# Patient Record
Sex: Female | Born: 1945
Health system: Southern US, Community
[De-identification: ages and names within clinical notes are randomized; demographics above are authoritative.]

## PROBLEM LIST (undated history)

## (undated) DIAGNOSIS — K219 Gastro-esophageal reflux disease without esophagitis: Secondary | ICD-10-CM

## (undated) DIAGNOSIS — Z9981 Dependence on supplemental oxygen: Secondary | ICD-10-CM

## (undated) DIAGNOSIS — E669 Obesity, unspecified: Secondary | ICD-10-CM

## (undated) DIAGNOSIS — C49A3 Gastrointestinal stromal tumor of small intestine: Secondary | ICD-10-CM

## (undated) DIAGNOSIS — I272 Pulmonary hypertension, unspecified: Secondary | ICD-10-CM

## (undated) DIAGNOSIS — I83893 Varicose veins of bilateral lower extremities with other complications: Secondary | ICD-10-CM

## (undated) DIAGNOSIS — J189 Pneumonia, unspecified organism: Secondary | ICD-10-CM

## (undated) DIAGNOSIS — Z87891 Personal history of nicotine dependence: Secondary | ICD-10-CM

## (undated) DIAGNOSIS — E119 Type 2 diabetes mellitus without complications: Secondary | ICD-10-CM

## (undated) DIAGNOSIS — G4733 Obstructive sleep apnea (adult) (pediatric): Secondary | ICD-10-CM

## (undated) DIAGNOSIS — H409 Unspecified glaucoma: Secondary | ICD-10-CM

## (undated) DIAGNOSIS — D649 Anemia, unspecified: Secondary | ICD-10-CM

## (undated) DIAGNOSIS — G4730 Sleep apnea, unspecified: Secondary | ICD-10-CM

## (undated) DIAGNOSIS — I509 Heart failure, unspecified: Secondary | ICD-10-CM

## (undated) DIAGNOSIS — D689 Coagulation defect, unspecified: Secondary | ICD-10-CM

## (undated) DIAGNOSIS — J45909 Unspecified asthma, uncomplicated: Secondary | ICD-10-CM

## (undated) DIAGNOSIS — I10 Essential (primary) hypertension: Secondary | ICD-10-CM

## (undated) DIAGNOSIS — Z95828 Presence of other vascular implants and grafts: Secondary | ICD-10-CM

## (undated) DIAGNOSIS — J449 Chronic obstructive pulmonary disease, unspecified: Secondary | ICD-10-CM

## (undated) DIAGNOSIS — I2781 Cor pulmonale (chronic): Secondary | ICD-10-CM

## (undated) DIAGNOSIS — E785 Hyperlipidemia, unspecified: Secondary | ICD-10-CM

## (undated) HISTORY — DX: Coagulation defect, unspecified: D68.9

## (undated) HISTORY — DX: Varicose veins of bilateral lower extremities with other complications: I83.893

## (undated) HISTORY — DX: Personal history of nicotine dependence: Z87.891

## (undated) HISTORY — DX: Hyperlipidemia, unspecified: E78.5

## (undated) HISTORY — DX: Unspecified glaucoma: H40.9

## (undated) HISTORY — DX: Obesity, unspecified: E66.9

## (undated) HISTORY — DX: Unspecified asthma, uncomplicated: J45.909

## (undated) HISTORY — DX: Type 2 diabetes mellitus without complications: E11.9

## (undated) HISTORY — DX: Gastrointestinal stromal tumor of small intestine: C49.A3

---
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
## 1989-04-14 HISTORY — PX: BREAST BIOPSY: SHX20

## 2004-04-14 HISTORY — PX: VEIN SURGERY: SHX48

## 2004-09-19 ENCOUNTER — Ambulatory Visit: Payer: Self-pay | Admitting: General Surgery

## 2005-07-22 ENCOUNTER — Ambulatory Visit: Payer: Self-pay | Admitting: Internal Medicine

## 2005-10-02 ENCOUNTER — Ambulatory Visit: Payer: Self-pay | Admitting: General Surgery

## 2008-02-03 ENCOUNTER — Inpatient Hospital Stay: Payer: Self-pay | Admitting: Internal Medicine

## 2008-04-14 HISTORY — PX: COLONOSCOPY: SHX174

## 2008-04-19 ENCOUNTER — Ambulatory Visit: Payer: Self-pay | Admitting: Unknown Physician Specialty

## 2010-07-17 ENCOUNTER — Ambulatory Visit: Payer: Self-pay | Admitting: Internal Medicine

## 2010-07-30 ENCOUNTER — Ambulatory Visit: Payer: Self-pay | Admitting: Internal Medicine

## 2010-08-26 ENCOUNTER — Ambulatory Visit: Payer: Self-pay | Admitting: General Surgery

## 2010-08-27 LAB — PATHOLOGY REPORT

## 2011-02-27 ENCOUNTER — Ambulatory Visit: Payer: Self-pay | Admitting: General Surgery

## 2011-04-15 HISTORY — PX: BREAST BIOPSY: SHX20

## 2011-07-18 ENCOUNTER — Ambulatory Visit: Payer: Self-pay | Admitting: General Surgery

## 2012-01-01 ENCOUNTER — Ambulatory Visit: Payer: Self-pay | Admitting: Internal Medicine

## 2012-06-04 ENCOUNTER — Encounter: Payer: Self-pay | Admitting: General Surgery

## 2012-06-05 ENCOUNTER — Encounter: Payer: Self-pay | Admitting: General Surgery

## 2012-07-19 ENCOUNTER — Ambulatory Visit: Payer: Self-pay | Admitting: Internal Medicine

## 2012-07-19 ENCOUNTER — Encounter: Payer: Self-pay | Admitting: General Surgery

## 2012-08-05 ENCOUNTER — Encounter: Payer: Self-pay | Admitting: General Surgery

## 2012-08-05 ENCOUNTER — Ambulatory Visit (INDEPENDENT_AMBULATORY_CARE_PROVIDER_SITE_OTHER): Payer: Medicare Other | Admitting: General Surgery

## 2012-08-05 ENCOUNTER — Other Ambulatory Visit: Payer: Self-pay | Admitting: *Deleted

## 2012-08-05 VITALS — BP 136/64 | HR 74 | Resp 12 | Ht 66.0 in | Wt 226.0 lb

## 2012-08-05 DIAGNOSIS — I83893 Varicose veins of bilateral lower extremities with other complications: Secondary | ICD-10-CM | POA: Insufficient documentation

## 2012-08-05 DIAGNOSIS — Z1231 Encounter for screening mammogram for malignant neoplasm of breast: Secondary | ICD-10-CM

## 2012-08-05 DIAGNOSIS — N6019 Diffuse cystic mastopathy of unspecified breast: Secondary | ICD-10-CM

## 2012-08-05 NOTE — Progress Notes (Signed)
Patient will be asked to return to the office in one year for a bilateral screening mammogram.

## 2012-08-05 NOTE — Patient Instructions (Addendum)
Patient to return in 1 year with bilateral screening mammogram. Continue use of compression hose. Call if any problems arise.

## 2012-08-05 NOTE — Progress Notes (Signed)
Patient ID: Susan Barajas, female   DOB: Sep 13, 1945, 67 y.o.   MRN: [id number]  Chief Complaint  Patient presents with  . Other    mammogram    HPI Susan Barajas is a 67 y.o. female. here today for her follow up mammogram done on 07-19-12, CAT 2. She has a known history of fibrocystic breast disease with a left breast biopsy 2013. Denies any new breast issues.  No family history of breast cancer. She has a history of Vein Closure Procedure; RF ablation of right GSV in 2006. Currently using compression hose with good control of symptoms.  HPI  Past Medical History  Diagnosis Date  . Asthma 1950  . Glaucoma 1949  . Personal history of tobacco use, presenting hazards to health   . Breast screening, unspecified   . Mammographic microcalcification   . Special screening for malignant neoplasms, colon   . 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 Obesity, unspecified   . Varicose veins of lower extremities with other complications   . Screening for obesity     Past Surgical History  Procedure Laterality Date  . Vein surgery Right 2006    Vein Closure Procedure; RF ablation of right GSV  . Colonoscopy  2010    Dr. Vira Agar  . Breast biopsy Right 1991  . Breast biopsy Left 2013    Family History  Problem Relation Age of Onset  . Prostate cancer Father   . Other Sister     mouth cancer    Social History History  Substance Use Topics  . Smoking status: Former Smoker -- 1.00 packs/day for 20 years  . Smokeless tobacco: Not on file  . Alcohol Use: No    No Known Allergies  Current Outpatient Prescriptions  Medication Sig Dispense Refill  . ferrous gluconate (FERGON) 325 MG tablet Take 325 mg by mouth daily.      . hydrochlorothiazide (HYDRODIURIL) 25 MG tablet Take 25 mg by mouth daily.      . insulin glargine (LANTUS) 100 UNIT/ML injection Inject into the skin at bedtime.      . insulin lispro (HUMALOG) 100 UNIT/ML injection Inject into the skin 3 (three) times  daily before meals.      . lovastatin (MEVACOR) 20 MG tablet Take 20 mg by mouth daily.      . metFORMIN (GLUCOPHAGE) 1000 MG tablet Take 1,000 mg by mouth 2 (two) times daily with a meal.      . Multiple Vitamin (MULTIVITAMIN) tablet Take 1 tablet by mouth daily.      . pioglitazone (ACTOS) 30 MG tablet Take 30 mg by mouth daily.       No current facility-administered medications for this visit.    Review of Systems Review of Systems  Constitutional: Negative.   Respiratory: Negative.   Cardiovascular: Negative.     Blood pressure 136/64, pulse 74, resp. rate 12, height _0  (1.676 m), weight 226 lb (102.513 kg).  Physical Exam Physical Exam  Constitutional: She appears well-developed and well-nourished.  Eyes: Conjunctivae are normal. No scleral icterus.  Neck: Trachea normal. No mass and no thyromegaly present.  Cardiovascular: Normal rate, normal heart sounds and normal pulses.  An irregular rhythm present.  No murmur heard. Irregular heart rate has been noted before and evaluated.   Pulmonary/Chest: Effort normal and breath sounds normal. Right breast exhibits no inverted nipple, no mass, no nipple discharge, no skin change and no tenderness. Left breast exhibits no inverted  nipple, no mass, no nipple discharge, no skin change and no tenderness.  Abdominal: Soft. Normal appearance and bowel sounds are normal. There is no hepatosplenomegaly. There is no tenderness.  Lymphadenopathy:    She has no cervical adenopathy.    She has no axillary adenopathy.    Data Reviewed Mammogram reviewed-Stable.  Assessment    Stable exam.     Plan    1 yr f/u        [facility] G 08/06/2012, 6:39 AM

## 2012-08-06 ENCOUNTER — Encounter: Payer: Self-pay | Admitting: General Surgery

## 2013-07-20 ENCOUNTER — Ambulatory Visit: Payer: Self-pay | Admitting: General Surgery

## 2013-07-27 ENCOUNTER — Ambulatory Visit: Payer: Medicare Other | Admitting: General Surgery

## 2013-08-01 ENCOUNTER — Ambulatory Visit: Payer: Self-pay | Admitting: General Surgery

## 2013-08-01 ENCOUNTER — Encounter: Payer: Self-pay | Admitting: General Surgery

## 2013-08-04 ENCOUNTER — Encounter: Payer: Self-pay | Admitting: General Surgery

## 2013-08-04 ENCOUNTER — Ambulatory Visit (INDEPENDENT_AMBULATORY_CARE_PROVIDER_SITE_OTHER): Payer: Medicare Other | Admitting: General Surgery

## 2013-08-04 VITALS — BP 160/68 | HR 68 | Resp 16 | Ht 66.0 in | Wt 230.0 lb

## 2013-08-04 DIAGNOSIS — R920 Mammographic microcalcification found on diagnostic imaging of breast: Secondary | ICD-10-CM

## 2013-08-04 DIAGNOSIS — Z9889 Other specified postprocedural states: Secondary | ICD-10-CM

## 2013-08-04 NOTE — Patient Instructions (Signed)
Continue self breast exams. Call office for any new breast issues or concerns. Follow up in 6 months with right diagnostic mammogram and office visit.

## 2013-08-04 NOTE — Progress Notes (Signed)
Patient ID: Susan Barajas, female   DOB: [birth year], 68 y.o.   MRN: [id number]  Chief Complaint  Patient presents with  . Follow-up    mammogram    HPI Susan Barajas is a 68 y.o. female.  who presents for her follow up breast evaluation. The most recent mammogram was done on 07-20-13.  Patient does perform regular self breast checks and gets regular mammograms done.  No new breast concerns.  HPI  Past Medical History  Diagnosis Date  . Asthma 1950  . Glaucoma 1949  . Personal history of tobacco use, presenting hazards to health   . Breast screening, unspecified   . Mammographic microcalcification   . Special screening for malignant neoplasms, colon   . Diabetes Age 10    Type 2; insulin dependent  . Obesity, unspecified   . Varicose veins of lower extremities with other complications   . Screening for obesity     Past Surgical History  Procedure Laterality Date  . Vein surgery Right 2006    Vein Closure Procedure; RF ablation of right GSV  . Colonoscopy  2010    Dr. Vira Agar  . Breast biopsy Right 1991  . Breast biopsy Left 2013    Family History  Problem Relation Age of Onset  . Prostate cancer Father   . Other Sister     mouth cancer    Social History History  Substance Use Topics  . Smoking status: Former Smoker -- 1.00 packs/day for 20 years  . Smokeless tobacco: Not on file  . Alcohol Use: No    No Known Allergies  Current Outpatient Prescriptions  Medication Sig Dispense Refill  . ferrous gluconate (FERGON) 325 MG tablet Take 325 mg by mouth daily.      . hydrochlorothiazide (HYDRODIURIL) 25 MG tablet Take 25 mg by mouth daily.      . insulin glargine (LANTUS) 100 UNIT/ML injection Inject into the skin at bedtime.      . lovastatin (MEVACOR) 20 MG tablet Take 20 mg by mouth daily.      . metFORMIN (GLUCOPHAGE) 1000 MG tablet Take 1,000 mg by mouth 2 (two) times daily with a meal.      . Multiple Vitamin (MULTIVITAMIN) tablet Take 1 tablet by mouth  daily.      Marland Kitchen NOVOLOG FLEXPEN 100 UNIT/ML FlexPen       . pioglitazone (ACTOS) 30 MG tablet Take 30 mg by mouth daily.       No current facility-administered medications for this visit.    Review of Systems Review of Systems  Constitutional: Negative.   Respiratory: Negative.   Cardiovascular: Negative.     Blood pressure 160/68, pulse 68, resp. rate 16, height _0  (1.676 m), weight 230 lb (104.327 kg).  Physical Exam Physical Exam  Constitutional: She is oriented to person, place, and time. She appears well-developed and well-nourished.  Eyes: Conjunctivae are normal.  Neck: Neck supple.  Cardiovascular: Normal rate, regular rhythm and normal heart sounds.   Pulmonary/Chest: Effort normal and breath sounds normal. Right breast exhibits no inverted nipple, no mass, no nipple discharge, no skin change and no tenderness. Left breast exhibits no inverted nipple, no mass, no nipple discharge, no skin change and no tenderness.  Abdominal: Soft. Normal appearance. There is no tenderness.  Lymphadenopathy:    She has no cervical adenopathy.    She has no axillary adenopathy.  Neurological: She is alert and oriented to person, place, and time.  Skin: Skin is warm and  dry.    Data Reviewed Mammogram reviewed -focal cluster of microcalcifications in right breast, possibly present 1 yr ago. Cat 3 Assessment    Stable physical exam. Discussed mammographic findings.    Plan    Follow up in 6 months with right diagnostic mammogram and office visit.        Seeplaputhur G Sankar 08/05/2013, 5:48 AM

## 2013-08-05 ENCOUNTER — Encounter: Payer: Self-pay | Admitting: General Surgery

## 2013-08-05 DIAGNOSIS — R920 Mammographic microcalcification found on diagnostic imaging of breast: Secondary | ICD-10-CM | POA: Insufficient documentation

## 2014-01-23 ENCOUNTER — Encounter: Payer: Self-pay | Admitting: General Surgery

## 2014-01-24 ENCOUNTER — Ambulatory Visit (INDEPENDENT_AMBULATORY_CARE_PROVIDER_SITE_OTHER): Payer: Medicare Other | Admitting: General Surgery

## 2014-01-24 ENCOUNTER — Encounter: Payer: Self-pay | Admitting: General Surgery

## 2014-01-24 VITALS — BP 116/70 | HR 78 | Resp 14 | Ht 65.0 in | Wt 228.0 lb

## 2014-01-24 DIAGNOSIS — R920 Mammographic microcalcification found on diagnostic imaging of breast: Secondary | ICD-10-CM

## 2014-01-24 NOTE — Patient Instructions (Signed)
Continue self breast exams. Call office for any new breast issues or concerns.

## 2014-01-24 NOTE — Progress Notes (Signed)
Patient ID: Susan Barajas, female   DOB: 03-07-1946, 68 y.o.   MRN: [id number]  Chief Complaint  Patient presents with  . Follow-up    mammogram    HPI Susan Barajas is a 68 y.o. female.  who presents for follow up of a focus of microcalcification in the right breast noted 6 months ago. The most recent right mammogram was done on 01-20-14.  Patient does perform regular self breast checks and gets regular mammograms done.  No new breast complaints.  HPI  Past Medical History  Diagnosis Date  . Asthma 1950  . Glaucoma 1949  . Personal history of tobacco use, presenting hazards to health   . Breast screening, unspecified   . Mammographic microcalcification   . Special screening for malignant neoplasms, colon   . Diabetes Age 79    Type 2; insulin dependent  . Obesity, unspecified   . Varicose veins of lower extremities with other complications   . Screening for obesity     Past Surgical History  Procedure Laterality Date  . Vein surgery Right 2006    Vein Closure Procedure; RF ablation of right GSV  . Colonoscopy  2010    Dr. Vira Agar  . Breast biopsy Right 1991  . Breast biopsy Left 2013    Family History  Problem Relation Age of Onset  . Prostate cancer Father   . Other Sister     mouth cancer    Social History History  Substance Use Topics  . Smoking status: Former Smoker -- 1.00 packs/day for 20 years  . Smokeless tobacco: Not on file  . Alcohol Use: No    No Known Allergies  Current Outpatient Prescriptions  Medication Sig Dispense Refill  . ferrous gluconate (FERGON) 325 MG tablet Take 325 mg by mouth daily.      . hydrochlorothiazide (HYDRODIURIL) 25 MG tablet Take 25 mg by mouth daily.      . insulin glargine (LANTUS) 100 UNIT/ML injection Inject 32 Units into the skin at bedtime.       . metFORMIN (GLUCOPHAGE) 1000 MG tablet Take 1,000 mg by mouth 2 (two) times daily with a meal.      . Multiple Vitamin (MULTIVITAMIN) tablet Take 1 tablet by  mouth daily.      Marland Kitchen NOVOLOG FLEXPEN 100 UNIT/ML FlexPen 8 Units 2 (two) times daily.       . pioglitazone (ACTOS) 30 MG tablet Take 30 mg by mouth daily.      . simvastatin (ZOCOR) 20 MG tablet Take 20 mg by mouth daily.      . timolol (TIMOPTIC) 0.25 % ophthalmic solution        No current facility-administered medications for this visit.    Review of Systems Review of Systems  Constitutional: Negative.   Respiratory: Negative.   Cardiovascular: Negative.     Blood pressure 116/70, pulse 78, resp. rate 14, height _0  (1.651 m), weight 228 lb (103.42 kg).  Physical Exam Physical Exam  Constitutional: She is oriented to person, place, and time. She appears well-developed and well-nourished.  Neck: Neck supple.  Pulmonary/Chest: Right breast exhibits no inverted nipple, no mass, no nipple discharge, no skin change and no tenderness. Left breast exhibits no inverted nipple, no mass, no nipple discharge, no skin change and no tenderness.  Lymphadenopathy:    She has no cervical adenopathy.    She has no axillary adenopathy.  Neurological: She is alert and oriented to person, place, and time.  Skin:  Skin is warm and dry.    Data Reviewed Mammogram reviewed and shows stable microcalcification in the right breast.   Assessment    Stable physical exam.  Abnormal mammogram.     Plan    6 month follow up and bilateral screening mammogram.        SANKAR,SEEPLAPUTHUR G 01/24/2014, 12:52 PM

## 2014-02-13 ENCOUNTER — Encounter: Payer: Self-pay | Admitting: General Surgery

## 2014-04-14 HISTORY — PX: COLONOSCOPY: SHX174

## 2014-05-12 ENCOUNTER — Ambulatory Visit: Payer: Self-pay | Admitting: Unknown Physician Specialty

## 2014-07-31 ENCOUNTER — Encounter: Payer: Self-pay | Admitting: General Surgery

## 2014-08-02 ENCOUNTER — Ambulatory Visit (INDEPENDENT_AMBULATORY_CARE_PROVIDER_SITE_OTHER): Payer: Medicare Other | Admitting: General Surgery

## 2014-08-02 ENCOUNTER — Encounter: Payer: Self-pay | Admitting: General Surgery

## 2014-08-02 VITALS — BP 140/72 | HR 70 | Resp 12 | Ht 65.0 in | Wt 229.0 lb

## 2014-08-02 DIAGNOSIS — N6019 Diffuse cystic mastopathy of unspecified breast: Secondary | ICD-10-CM | POA: Diagnosis not present

## 2014-08-02 DIAGNOSIS — R920 Mammographic microcalcification found on diagnostic imaging of breast: Secondary | ICD-10-CM

## 2014-08-02 NOTE — Progress Notes (Signed)
Patient ID: Susan Barajas, female   DOB: 1946-02-15, 69 y.o.   MRN: [id number]  Chief Complaint  Patient presents with  . Follow-up    mammogram    HPI Susan Anna Cuen is a 69 y.o. female.  who presents for a 6 month follow up and breast evaluation. The most recent mammogram was done on 07-28-14.  Patient does perform regular self breast checks and gets regular mammograms done.  No new breast issues.  HPI  Past Medical History  Diagnosis Date  . Asthma 1950  . Glaucoma 1949  . Personal history of tobacco use, presenting hazards to health   . Diabetes Age 87    Type 2; insulin dependent  . Obesity, unspecified   . Varicose veins of lower extremities with other complications     Past Surgical History  Procedure Laterality Date  . Vein surgery Right 2006    Vein Closure Procedure; RF ablation of right GSV  . Colonoscopy  2010    Dr. Vira Agar  . Breast biopsy Right 1991  . Breast biopsy Left 2013  . Colonoscopy  Jan 2016    Dr Vira Agar    Family History  Problem Relation Age of Onset  . Prostate cancer Father   . Other Sister     mouth cancer    Social History History  Substance Use Topics  . Smoking status: Former Smoker -- 1.00 packs/day for 20 years  . Smokeless tobacco: Never Used  . Alcohol Use: No    No Known Allergies  Current Outpatient Prescriptions  Medication Sig Dispense Refill  . ferrous gluconate (FERGON) 325 MG tablet Take 325 mg by mouth daily.    . hydrochlorothiazide (HYDRODIURIL) 25 MG tablet Take 25 mg by mouth daily.    . insulin glargine (LANTUS) 100 UNIT/ML injection Inject 32 Units into the skin at bedtime.     . metFORMIN (GLUCOPHAGE) 1000 MG tablet Take 1,000 mg by mouth 2 (two) times daily with a meal.    . Multiple Vitamin (MULTIVITAMIN) tablet Take 1 tablet by mouth daily.    Marland Kitchen NOVOLOG FLEXPEN 100 UNIT/ML FlexPen 8 Units 2 (two) times daily.     . pioglitazone (ACTOS) 30 MG tablet Take 30 mg by mouth daily.    . simvastatin  (ZOCOR) 20 MG tablet Take 20 mg by mouth daily.    . timolol (TIMOPTIC) 0.25 % ophthalmic solution      No current facility-administered medications for this visit.    Review of Systems Review of Systems  Constitutional: Negative.   Respiratory: Negative.   Cardiovascular: Negative.     Blood pressure 140/72, pulse 70, resp. rate 12, height _0  (1.651 m), weight 229 lb (103.874 kg).  Physical Exam Physical Exam  Constitutional: She is oriented to person, place, and time. She appears well-developed and well-nourished.  Eyes: Conjunctivae are normal. No scleral icterus.  Neck: Neck supple.  Cardiovascular: Normal rate, regular rhythm and normal heart sounds.   Pulmonary/Chest: Effort normal and breath sounds normal. Right breast exhibits no inverted nipple, no mass, no nipple discharge, no skin change and no tenderness. Left breast exhibits no inverted nipple, no mass, no nipple discharge, no skin change and no tenderness.  Lymphadenopathy:    She has no cervical adenopathy.    She has no axillary adenopathy.  Neurological: She is alert and oriented to person, place, and time.  Skin: Skin is warm and dry.    Data Reviewed Mammogram reviewed and stable. No change in  the microcalcification in the right breast.   Assessment    Stable physical exam.     Plan    Patient will be asked to return to the office in 1 year with a bilateral diagnostic mammogram.         PCP:  Trudie Reed 08/02/2014, 3:31 PM

## 2014-08-02 NOTE — Patient Instructions (Addendum)
Continue self breast exams. Call office for any new breast issues or concerns. Patient will be asked to return to the office in 1 year with a bilateral diagnostic mammogram

## 2015-05-17 ENCOUNTER — Encounter: Payer: Self-pay | Admitting: *Deleted

## 2015-07-24 ENCOUNTER — Encounter: Payer: Self-pay | Admitting: General Surgery

## 2015-07-31 ENCOUNTER — Ambulatory Visit (INDEPENDENT_AMBULATORY_CARE_PROVIDER_SITE_OTHER): Payer: Medicare Other | Admitting: General Surgery

## 2015-07-31 ENCOUNTER — Encounter: Payer: Self-pay | Admitting: General Surgery

## 2015-07-31 VITALS — BP 140/74 | HR 70 | Resp 14 | Ht 65.0 in | Wt 225.0 lb

## 2015-07-31 DIAGNOSIS — N6019 Diffuse cystic mastopathy of unspecified breast: Secondary | ICD-10-CM | POA: Diagnosis not present

## 2015-07-31 NOTE — Patient Instructions (Addendum)
The patient is aware to call back for any questions or concerns. Patient will be asked to return to the office in one year with a bilateral screening mammogram.

## 2015-07-31 NOTE — Progress Notes (Signed)
Patient ID: Susan Barajas, female   DOB: Jan 13, 1946, 70 y.o.   MRN: [id number]  Chief Complaint  Patient presents with  . Follow-up    mammogram    HPI Susan Barajas is a 70 y.o. female. who presents for a breast evaluation. The most recent mammogram was done on 07-24-15.  Patient does perform regular self breast checks and gets regular mammograms done.  No new breast issues. She continues to wear her compression hose. I have reviewed the history of present illness with the patient.   HPI  Past Medical History  Diagnosis Date  . Asthma 1950  . Glaucoma 1949  . Personal history of tobacco use, presenting hazards to health   . Diabetes ([place]) Age 37    Type 2; insulin dependent  . Obesity, unspecified   . Varicose veins of lower extremities with other complications     Past Surgical History  Procedure Laterality Date  . Vein surgery Right 2006    Vein Closure Procedure; RF ablation of right GSV  . Colonoscopy  2010    Dr. Vira Agar  . Breast biopsy Right 1991  . Breast biopsy Left 2013  . Colonoscopy  Jan 2016    Dr Vira Agar    Family History  Problem Relation Age of Onset  . Prostate cancer Father   . Other Sister     mouth cancer    Social History Social History  Substance Use Topics  . Smoking status: Former Smoker -- 1.00 packs/day for 20 years  . Smokeless tobacco: Never Used  . Alcohol Use: No    No Known Allergies  Current Outpatient Prescriptions  Medication Sig Dispense Refill  . ferrous gluconate (FERGON) 325 MG tablet Take 325 mg by mouth daily.    Marland Kitchen HUMALOG KWIKPEN 100 UNIT/ML KiwkPen Inject 8 Units into the skin 2 (two) times daily.     . hydrochlorothiazide (HYDRODIURIL) 25 MG tablet Take 25 mg by mouth daily.    . insulin glargine (LANTUS) 100 UNIT/ML injection Inject 32 Units into the skin daily.     . metFORMIN (GLUCOPHAGE) 1000 MG tablet Take 1,000 mg by mouth 2 (two) times daily with a meal.    . Multiple Vitamin (MULTIVITAMIN)  tablet Take 1 tablet by mouth daily.    . pioglitazone (ACTOS) 30 MG tablet Take 30 mg by mouth daily.    . simvastatin (ZOCOR) 20 MG tablet Take 20 mg by mouth daily.    . timolol (TIMOPTIC) 0.25 % ophthalmic solution      No current facility-administered medications for this visit.    Review of Systems Review of Systems  Constitutional: Negative.   Respiratory: Negative.   Cardiovascular: Negative.     Blood pressure 140/74, pulse 70, resp. rate 14, height _0  (1.651 m), weight 225 lb (102.059 kg).  Physical Exam Physical Exam  Constitutional: She is oriented to person, place, and time. She appears well-developed and well-nourished.  HENT:  Mouth/Throat: Oropharynx is clear and moist.  Eyes: Conjunctivae are normal. No scleral icterus.  Neck: Neck supple.  Cardiovascular: Normal rate, regular rhythm and normal heart sounds.   Pulses:      Dorsalis pedis pulses are 2+ on the right side, and 2+ on the left side.       Posterior tibial pulses are 2+ on the right side, and 2+ on the left side.  Mild right lower leg edema.  Pulmonary/Chest: Effort normal and breath sounds normal. Right breast exhibits no inverted nipple, no  mass, no nipple discharge, no skin change and no tenderness. Left breast exhibits no inverted nipple, no mass, no nipple discharge, no skin change and no tenderness.  Abdominal: Soft. There is no tenderness.  Lymphadenopathy:    She has no cervical adenopathy.    She has no axillary adenopathy.  Neurological: She is alert and oriented to person, place, and time.  Skin: Skin is warm and dry.  Psychiatric: Her behavior is normal.    Data Reviewed Mammogram reviewed and stable with some benign appearing calcifications.  Assessment    Stable physical exam. Fibrocystic disease by previous biopsies. Varicose veins are asymptomatic.     Plan    Patient will be asked to return to the office in one year with a bilateral screening mammogram.      PCP:   Benita Stabile  This information has been scribed by Karie Fetch RN, BSN,BC.    SANKAR,SEEPLAPUTHUR G 07/31/2015, 11:33 AM

## 2016-04-14 DIAGNOSIS — I82409 Acute embolism and thrombosis of unspecified deep veins of unspecified lower extremity: Secondary | ICD-10-CM

## 2016-04-14 HISTORY — DX: Acute embolism and thrombosis of unspecified deep veins of unspecified lower extremity: I82.409

## 2016-07-28 ENCOUNTER — Encounter: Payer: Self-pay | Admitting: General Surgery

## 2016-07-30 ENCOUNTER — Encounter: Payer: Self-pay | Admitting: General Surgery

## 2016-07-30 ENCOUNTER — Ambulatory Visit (INDEPENDENT_AMBULATORY_CARE_PROVIDER_SITE_OTHER): Payer: Medicare Other | Admitting: General Surgery

## 2016-07-30 VITALS — BP 130/72 | HR 74 | Resp 14 | Ht 65.0 in | Wt 228.0 lb

## 2016-07-30 DIAGNOSIS — Z87898 Personal history of other specified conditions: Secondary | ICD-10-CM | POA: Diagnosis not present

## 2016-07-30 NOTE — Progress Notes (Signed)
Patient ID: Susan Barajas, female   DOB: [birth year], 71 y.o.   MRN: [id number]  Chief Complaint  Patient presents with  . Follow-up    mammogram     HPI Susan Barajas is a 71 y.o. female who presents for a breast evaluation. The most recent mammogram was done on 07/25/2016.  Patient does perform regular self breast checks and gets regular mammograms done.  No new breast complaints.   HPI  Past Medical History:  Diagnosis Date  . Asthma 1950  . Diabetes ([place]) Age 18   Type 2; insulin dependent  . Glaucoma 1949  . Obesity, unspecified   . Personal history of tobacco use, presenting hazards to health   . Varicose veins of lower extremities with other complications     Past Surgical History:  Procedure Laterality Date  . BREAST BIOPSY Right 1991  . BREAST BIOPSY Left 2013  . COLONOSCOPY  2010   Dr. Vira Agar  . COLONOSCOPY  Jan 2016   Dr Vira Agar  . VEIN SURGERY Right 2006   Vein Closure Procedure; RF ablation of right GSV    Family History  Problem Relation Age of Onset  . Prostate cancer Father   . Other Sister     mouth cancer    Social History Social History  Substance Use Topics  . Smoking status: Former Smoker    Packs/day: 1.00    Years: 20.00  . Smokeless tobacco: Never Used  . Alcohol use No    No Known Allergies  Current Outpatient Prescriptions  Medication Sig Dispense Refill  . ferrous gluconate (FERGON) 325 MG tablet Take 325 mg by mouth daily.    Marland Kitchen HUMALOG KWIKPEN 100 UNIT/ML KiwkPen Inject 8 Units into the skin 2 (two) times daily.     . hydrochlorothiazide (HYDRODIURIL) 25 MG tablet Take 25 mg by mouth daily.    . insulin glargine (LANTUS) 100 UNIT/ML injection Inject 32 Units into the skin daily.     . metFORMIN (GLUCOPHAGE) 1000 MG tablet Take 1,000 mg by mouth 2 (two) times daily with a meal.    . Multiple Vitamin (MULTIVITAMIN) tablet Take 1 tablet by mouth daily.    . pioglitazone (ACTOS) 30 MG tablet Take 30 mg by mouth daily.     . simvastatin (ZOCOR) 20 MG tablet Take 20 mg by mouth daily.    . timolol (TIMOPTIC) 0.25 % ophthalmic solution      No current facility-administered medications for this visit.     Review of Systems Review of Systems  Constitutional: Negative.   Respiratory: Negative.   Cardiovascular: Negative.     Blood pressure 130/72, pulse 74, resp. rate 14, height _0  (1.651 m), weight 228 lb (103.4 kg).  Physical Exam Physical Exam  Constitutional: She is oriented to person, place, and time. She appears well-developed and well-nourished.  Eyes: Conjunctivae are normal. No scleral icterus.  Neck: Neck supple.  Cardiovascular: Normal rate, regular rhythm and normal heart sounds.   Pulmonary/Chest: Effort normal and breath sounds normal. Right breast exhibits no inverted nipple, no mass, no nipple discharge, no skin change and no tenderness. Left breast exhibits no inverted nipple, no mass, no nipple discharge, no skin change and no tenderness.  Abdominal: Soft. Bowel sounds are normal. There is no tenderness.  Lymphadenopathy:    She has no cervical adenopathy.    She has no axillary adenopathy.  Neurological: She is alert and oriented to person, place, and time.  Skin: Skin is warm and  dry.    Data Reviewed Mammogram reviewed - stable  Assessment    Stable exam. History of fibrocystic breast disease.     Plan    Patient to return to her PCP for yearly breast checks and mammograms. The patient is aware to call back for any questions or concerns.   HPI, Physical Exam, Assessment and Plan have been scribed under the direction and in the presence of Mckinley Jewel, MD  Gaspar Cola, CMA     I have completed the exam and reviewed the above documentation for accuracy and completeness.  I agree with the above.  Haematologist has been used and any errors in dictation or transcription are unintentional.  Seeplaputhur G. Jamal Collin, M.D., F.A.C.S.  Junie Panning  G 07/30/2016, 4:19 PM

## 2016-07-30 NOTE — Patient Instructions (Signed)
  Patient to return to her PCP for yearly breast checks and mammograms. The patient is aware to call back for any questions or concerns.

## 2016-07-31 ENCOUNTER — Ambulatory Visit: Payer: Medicare Other | Admitting: General Surgery

## 2016-10-22 ENCOUNTER — Inpatient Hospital Stay: Payer: Medicare Other

## 2016-10-22 ENCOUNTER — Encounter: Payer: Self-pay | Admitting: Internal Medicine

## 2016-10-22 ENCOUNTER — Emergency Department (HOSPITAL_COMMUNITY): Payer: Medicare Other

## 2016-10-22 ENCOUNTER — Emergency Department: Payer: Medicare Other

## 2016-10-22 ENCOUNTER — Inpatient Hospital Stay
Admission: EM | Admit: 2016-10-22 | Discharge: 2016-10-26 | DRG: 640 | Disposition: A | Discharge status: Home / Self Care | Payer: Medicare Other | Attending: Internal Medicine | Admitting: Internal Medicine

## 2016-10-22 ENCOUNTER — Inpatient Hospital Stay: Admit: 2016-10-22 | Payer: Medicare Other

## 2016-10-22 DIAGNOSIS — E860 Dehydration: Secondary | ICD-10-CM | POA: Diagnosis present

## 2016-10-22 DIAGNOSIS — Z87891 Personal history of nicotine dependence: Secondary | ICD-10-CM

## 2016-10-22 DIAGNOSIS — G9341 Metabolic encephalopathy: Secondary | ICD-10-CM | POA: Diagnosis present

## 2016-10-22 DIAGNOSIS — T502X5A Adverse effect of carbonic-anhydrase inhibitors, benzothiadiazides and other diuretics, initial encounter: Secondary | ICD-10-CM | POA: Diagnosis present

## 2016-10-22 DIAGNOSIS — R29704 NIHSS score 4: Secondary | ICD-10-CM | POA: Diagnosis present

## 2016-10-22 DIAGNOSIS — R4781 Slurred speech: Secondary | ICD-10-CM | POA: Diagnosis present

## 2016-10-22 DIAGNOSIS — Z794 Long term (current) use of insulin: Secondary | ICD-10-CM

## 2016-10-22 DIAGNOSIS — R2990 Unspecified symptoms and signs involving the nervous system: Secondary | ICD-10-CM

## 2016-10-22 DIAGNOSIS — E871 Hypo-osmolality and hyponatremia: Secondary | ICD-10-CM | POA: Diagnosis present

## 2016-10-22 DIAGNOSIS — E119 Type 2 diabetes mellitus without complications: Secondary | ICD-10-CM | POA: Diagnosis present

## 2016-10-22 DIAGNOSIS — Z79899 Other long term (current) drug therapy: Secondary | ICD-10-CM | POA: Diagnosis not present

## 2016-10-22 DIAGNOSIS — R4189 Other symptoms and signs involving cognitive functions and awareness: Secondary | ICD-10-CM | POA: Diagnosis present

## 2016-10-22 DIAGNOSIS — I5030 Unspecified diastolic (congestive) heart failure: Secondary | ICD-10-CM | POA: Diagnosis not present

## 2016-10-22 DIAGNOSIS — H409 Unspecified glaucoma: Secondary | ICD-10-CM | POA: Diagnosis present

## 2016-10-22 DIAGNOSIS — E785 Hyperlipidemia, unspecified: Secondary | ICD-10-CM | POA: Diagnosis present

## 2016-10-22 DIAGNOSIS — R52 Pain, unspecified: Secondary | ICD-10-CM

## 2016-10-22 DIAGNOSIS — R29810 Facial weakness: Secondary | ICD-10-CM | POA: Diagnosis present

## 2016-10-22 DIAGNOSIS — I639 Cerebral infarction, unspecified: Secondary | ICD-10-CM

## 2016-10-22 DIAGNOSIS — I10 Essential (primary) hypertension: Secondary | ICD-10-CM | POA: Diagnosis present

## 2016-10-22 DIAGNOSIS — Z6841 Body Mass Index (BMI) 40.0 and over, adult: Secondary | ICD-10-CM

## 2016-10-22 DIAGNOSIS — Z7984 Long term (current) use of oral hypoglycemic drugs: Secondary | ICD-10-CM

## 2016-10-22 DIAGNOSIS — R609 Edema, unspecified: Secondary | ICD-10-CM | POA: Diagnosis not present

## 2016-10-22 HISTORY — DX: Essential (primary) hypertension: I10

## 2016-10-22 LAB — URINALYSIS, COMPLETE (UACMP) WITH MICROSCOPIC
Bilirubin Urine: NEGATIVE
GLUCOSE, UA: NEGATIVE mg/dL
Hgb urine dipstick: NEGATIVE
KETONES UR: NEGATIVE mg/dL
Nitrite: NEGATIVE
PH: 7 (ref 5.0–8.0)
Protein, ur: NEGATIVE mg/dL
Specific Gravity, Urine: 1.006 (ref 1.005–1.030)

## 2016-10-22 LAB — CBC
HCT: 28.3 % — ABNORMAL LOW (ref 35.0–47.0)
HEMOGLOBIN: 9.3 g/dL — AB (ref 12.0–16.0)
MCH: 27.4 pg (ref 26.0–34.0)
MCHC: 32.8 g/dL (ref 32.0–36.0)
MCV: 83.5 fL (ref 80.0–100.0)
Platelets: 400 10*3/uL (ref 150–440)
RBC: 3.38 MIL/uL — ABNORMAL LOW (ref 3.80–5.20)
RDW: 16.2 % — AB (ref 11.5–14.5)
WBC: 5.4 10*3/uL (ref 3.6–11.0)

## 2016-10-22 LAB — OSMOLALITY: Osmolality: 238 mOsm/kg — CL (ref 275–295)

## 2016-10-22 LAB — TROPONIN I

## 2016-10-22 LAB — COMPREHENSIVE METABOLIC PANEL
ALBUMIN: 3.4 g/dL — AB (ref 3.5–5.0)
ALT: 17 U/L (ref 14–54)
AST: 25 U/L (ref 15–41)
Alkaline Phosphatase: 45 U/L (ref 38–126)
Anion gap: 8 (ref 5–15)
BUN: 8 mg/dL (ref 6–20)
CHLORIDE: 81 mmol/L — AB (ref 101–111)
CO2: 27 mmol/L (ref 22–32)
Calcium: 8.8 mg/dL — ABNORMAL LOW (ref 8.9–10.3)
Creatinine, Ser: 0.49 mg/dL (ref 0.44–1.00)
GFR calc Af Amer: >60 mL/min (ref >60)
GFR calc non Af Amer: >60 mL/min (ref >60)
GLUCOSE: 162 mg/dL — AB (ref 65–99)
POTASSIUM: 4.5 mmol/L (ref 3.5–5.1)
Sodium: 116 mmol/L — CL (ref 135–145)
Total Bilirubin: 0.4 mg/dL (ref 0.3–1.2)
Total Protein: 6.8 g/dL (ref 6.5–8.1)

## 2016-10-22 LAB — DIFFERENTIAL
BASOS ABS: 0 10*3/uL (ref 0–0.1)
BASOS PCT: 1 %
EOS ABS: 0.2 10*3/uL (ref 0–0.7)
Eosinophils Relative: 3 %
Lymphocytes Relative: 10 %
Lymphs Abs: 0.5 10*3/uL — ABNORMAL LOW (ref 1.0–3.6)
Monocytes Absolute: 0.7 10*3/uL (ref 0.2–0.9)
Monocytes Relative: 13 %
NEUTROS PCT: 73 %
Neutro Abs: 4 10*3/uL (ref 1.4–6.5)

## 2016-10-22 LAB — LIPID PANEL
CHOL/HDL RATIO: 2.2 ratio
CHOLESTEROL: 104 mg/dL (ref 0–200)
HDL: 47 mg/dL (ref >40)
LDL Cholesterol: 48 mg/dL (ref 0–99)
Triglycerides: 47 mg/dL (ref <150)
VLDL: 9 mg/dL (ref 0–40)

## 2016-10-22 LAB — GLUCOSE, CAPILLARY
Glucose-Capillary: 166 mg/dL — ABNORMAL HIGH (ref 65–99)
Glucose-Capillary: 234 mg/dL — ABNORMAL HIGH (ref 65–99)

## 2016-10-22 LAB — SODIUM
SODIUM: 119 mmol/L — AB (ref 135–145)
SODIUM: 117 mmol/L — AB (ref 135–145)

## 2016-10-22 LAB — SODIUM, URINE, RANDOM: Sodium, Ur: 95 mmol/L

## 2016-10-22 LAB — PROTIME-INR
INR: 1
Prothrombin Time: 13.2 seconds (ref 11.4–15.2)

## 2016-10-22 LAB — TSH: TSH: 1.423 u[IU]/mL (ref 0.350–4.500)

## 2016-10-22 LAB — APTT: APTT: 27 s (ref 24–36)

## 2016-10-22 MED ORDER — SODIUM CHLORIDE 0.9 % IV SOLN
INTRAVENOUS | Status: AC
  Administered 2016-10-22 – 2016-10-23 (×2): via INTRAVENOUS

## 2016-10-22 MED ORDER — ONDANSETRON HCL 4 MG/2ML IJ SOLN: 4.0000 mg | Freq: Four times a day (QID) | INTRAMUSCULAR | Status: DC | PRN

## 2016-10-22 MED ORDER — ADULT MULTIVITAMIN W/MINERALS CH
1.0000 | ORAL_TABLET | Freq: Every day | ORAL | Status: DC
  Administered 2016-10-23 – 2016-10-26 (×4): 1 via ORAL
  Filled 2016-10-22 (×4): qty 1

## 2016-10-22 MED ORDER — ACETAMINOPHEN 325 MG PO TABS: 650.0000 mg | ORAL_TABLET | Freq: Four times a day (QID) | ORAL | Status: DC | PRN

## 2016-10-22 MED ORDER — ACETAMINOPHEN 650 MG RE SUPP: 650.0000 mg | Freq: Four times a day (QID) | RECTAL | Status: DC | PRN

## 2016-10-22 MED ORDER — ENOXAPARIN SODIUM 40 MG/0.4ML ~~LOC~~ SOLN
40.0000 mg | SUBCUTANEOUS | Status: DC
  Administered 2016-10-22 – 2016-10-25 (×4): 40 mg via SUBCUTANEOUS
  Filled 2016-10-22 (×4): qty 0.4

## 2016-10-22 MED ORDER — ASPIRIN 81 MG PO CHEW
81.0000 mg | CHEWABLE_TABLET | Freq: Every day | ORAL | Status: DC
  Administered 2016-10-23 – 2016-10-26 (×4): 81 mg via ORAL
  Filled 2016-10-22 (×4): qty 1

## 2016-10-22 MED ORDER — POLYETHYLENE GLYCOL 3350 17 G PO PACK: 17.0000 g | PACK | Freq: Every day | ORAL | Status: DC | PRN

## 2016-10-22 MED ORDER — TIMOLOL MALEATE 0.25 % OP SOLN
1.0000 [drp] | Freq: Every day | OPHTHALMIC | Status: DC
  Administered 2016-10-23 – 2016-10-26 (×4): 1 [drp] via OPHTHALMIC
  Filled 2016-10-22: qty 5

## 2016-10-22 MED ORDER — ATORVASTATIN CALCIUM 20 MG PO TABS
40.0000 mg | ORAL_TABLET | Freq: Every day | ORAL | Status: DC
  Administered 2016-10-22 – 2016-10-25 (×4): 40 mg via ORAL
  Filled 2016-10-22 (×4): qty 2

## 2016-10-22 MED ORDER — ONDANSETRON HCL 4 MG PO TABS: 4.0000 mg | ORAL_TABLET | Freq: Four times a day (QID) | ORAL | Status: DC | PRN

## 2016-10-22 MED ORDER — DOCUSATE SODIUM 100 MG PO CAPS
100.0000 mg | ORAL_CAPSULE | Freq: Two times a day (BID) | ORAL | Status: DC
  Administered 2016-10-22 – 2016-10-26 (×7): 100 mg via ORAL
  Filled 2016-10-22 (×8): qty 1

## 2016-10-22 MED ORDER — PIOGLITAZONE HCL 30 MG PO TABS
30.0000 mg | ORAL_TABLET | Freq: Every day | ORAL | Status: DC
  Administered 2016-10-23 – 2016-10-26 (×4): 30 mg via ORAL
  Filled 2016-10-22 (×4): qty 1

## 2016-10-22 MED ORDER — METFORMIN HCL 500 MG PO TABS
1000.0000 mg | ORAL_TABLET | Freq: Every day | ORAL | Status: DC
  Administered 2016-10-23 – 2016-10-26 (×4): 1000 mg via ORAL
  Filled 2016-10-22 (×4): qty 2

## 2016-10-22 MED ORDER — SODIUM CHLORIDE 0.9 % IV BOLUS (SEPSIS)
1000.0000 mL | Freq: Once | INTRAVENOUS | Status: AC
  Administered 2016-10-22: 1000 mL via INTRAVENOUS

## 2016-10-22 MED ORDER — INSULIN ASPART 100 UNIT/ML ~~LOC~~ SOLN
0.0000 [IU] | Freq: Three times a day (TID) | SUBCUTANEOUS | Status: DC
  Administered 2016-10-22: 3 [IU] via SUBCUTANEOUS
  Administered 2016-10-23: 08:00:00 2 [IU] via SUBCUTANEOUS
  Administered 2016-10-23 (×2): 3 [IU] via SUBCUTANEOUS
  Administered 2016-10-24: 2 [IU] via SUBCUTANEOUS
  Administered 2016-10-24: 3 [IU] via SUBCUTANEOUS
  Administered 2016-10-25 (×2): 2 [IU] via SUBCUTANEOUS
  Administered 2016-10-26: 12:00:00 5 [IU] via SUBCUTANEOUS
  Administered 2016-10-26: 2 [IU] via SUBCUTANEOUS
  Filled 2016-10-22 (×10): qty 1

## 2016-10-22 MED ORDER — LATANOPROST 0.005 % OP SOLN
1.0000 [drp] | Freq: Every day | OPHTHALMIC | Status: DC
  Administered 2016-10-22 – 2016-10-25 (×4): 1 [drp] via OPHTHALMIC
  Filled 2016-10-22: qty 2.5

## 2016-10-22 MED ORDER — INSULIN ASPART 100 UNIT/ML ~~LOC~~ SOLN
0.0000 [IU] | Freq: Every day | SUBCUTANEOUS | Status: DC
  Administered 2016-10-22 – 2016-10-25 (×3): 2 [IU] via SUBCUTANEOUS
  Filled 2016-10-22 (×3): qty 1

## 2016-10-22 MED ORDER — INSULIN GLARGINE 100 UNIT/ML ~~LOC~~ SOLN
32.0000 [IU] | Freq: Every day | SUBCUTANEOUS | Status: DC
  Administered 2016-10-23 – 2016-10-26 (×4): 32 [IU] via SUBCUTANEOUS
  Filled 2016-10-22 (×4): qty 0.32

## 2016-10-22 NOTE — ED Triage Notes (Signed)
Pt sent here for abnormal sodium levels. Family reports pt has been more confused and had difficulty "getting sentences" for about a week. Pt thinks today is Tuesday, year 2018.

## 2016-10-22 NOTE — H&P (Signed)
Susan Barajas at [facility] NAME: Susan Barajas    MR#:  [id number]  DATE OF BIRTH:  [birth year]  DATE OF ADMISSION:  10/22/2016  PRIMARY CARE PHYSICIAN: Albina Billet, MD   REQUESTING/REFERRING PHYSICIAN: Dr. Merlyn Lot  CHIEF COMPLAINT:   Chief Complaint  Patient presents with  . Abnormal Lab    HISTORY OF PRESENT ILLNESS:  Susan Barajas  is a 71 y.o. female with a known history of Insulin-dependent diabetes mellitus, hypertension, asthma, glaucoma presents to Hospital secondary to slurred speech and word finding difficulties for almost a week now. Patient is unable to explain the history because of some cognitive deficits which is new according to family, also having word finding difficulties. According to patient's daughter, patient is independent at baseline. For the past week they have noticed that her speech has slowed down and also her walking. She was having trouble saying the words right and at times she is giving them a completely different answer when she was asked something else. They haven't noticed any other focal deficits. Patient denies any significant weakness, tingling or numbness. Denies any chest pain, shortness of breath. No fevers or chills. Denies any increased frequency of urination or dysuria. They went to see PCP who had ordered blood work last week and was asked to repeat the blood work again due to red flag on her sodium numbers. She had repeat blood work done yesterday and her sodium came back again low and so was directed to come to the emergency room. Sodium here is noted to be 116. The patient has been on hydrochlorothiazide for a while. Also right facial droop is noted on exam which is new according to daughter for the last 2 days. However CT of the head did not show any acute infarcts.  PAST MEDICAL HISTORY:   Past Medical History:  Diagnosis Date  . Asthma 1950  . Diabetes ([place]) Age 40   Type 2; insulin  dependent  . Glaucoma 1949  . Hypertension   . Obesity, unspecified   . Personal history of tobacco use, presenting hazards to health   . Varicose veins of lower extremities with other complications     PAST SURGICAL HISTORY:   Past Surgical History:  Procedure Laterality Date  . BREAST BIOPSY Right 1991  . BREAST BIOPSY Left 2013  . COLONOSCOPY  2010   Dr. Vira Agar  . COLONOSCOPY  Jan 2016   Dr Vira Agar  . VEIN SURGERY Right 2006   Vein Closure Procedure; RF ablation of right GSV    SOCIAL HISTORY:   Social History  Substance Use Topics  . Smoking status: Former Smoker    Packs/day: 1.00    Years: 20.00  . Smokeless tobacco: Never Used  . Alcohol use No    FAMILY HISTORY:   Family History  Problem Relation Age of Onset  . Prostate cancer Father   . Other Sister        mouth cancer  . Hypertension Mother     DRUG ALLERGIES:  No Known Allergies  REVIEW OF SYSTEMS:   Review of Systems  Constitutional: Positive for malaise/fatigue. Negative for chills, fever and weight loss.  HENT: Negative for ear discharge, ear pain, hearing loss and nosebleeds.   Eyes: Negative for blurred vision, double vision and photophobia.  Respiratory: Negative for cough, hemoptysis, shortness of breath and wheezing.   Cardiovascular: Negative for chest pain, palpitations, orthopnea and leg swelling.  Gastrointestinal: Negative for abdominal  pain, constipation, diarrhea, heartburn, melena, nausea and vomiting.  Genitourinary: Negative for dysuria, frequency and urgency.  Musculoskeletal: Negative for back pain, myalgias and neck pain.  Skin: Negative for rash.  Neurological: Positive for dizziness and speech change. Negative for tingling, tremors, sensory change, focal weakness and headaches.  Endo/Heme/Allergies: Does not bruise/bleed easily.  Psychiatric/Behavioral: Negative for depression.    MEDICATIONS AT HOME:   Prior to Admission medications   Medication Sig Start Date End  Date Taking? Authorizing Provider  ferrous gluconate (FERGON) 325 MG tablet Take 325 mg by mouth daily.   Yes [provider]  HUMALOG KWIKPEN 100 UNIT/ML KiwkPen Inject 10 Units into the skin 2 (two) times daily.  07/03/15  Yes [provider]  hydrochlorothiazide (HYDRODIURIL) 25 MG tablet Take 25 mg by mouth daily.   Yes [provider]  insulin glargine (LANTUS) 100 UNIT/ML injection Inject 32 Units into the skin daily.    Yes [provider]  latanoprost (XALATAN) 0.005 % ophthalmic solution 1 drop at bedtime.   Yes [provider]  lovastatin (MEVACOR) 20 MG tablet Take 20 mg by mouth at bedtime.   Yes [provider]  metFORMIN (GLUCOPHAGE) 1000 MG tablet Take 1,000 mg by mouth daily.    Yes [provider]  Multiple Vitamin (MULTIVITAMIN) tablet Take 1 tablet by mouth daily.   Yes [provider]  pioglitazone (ACTOS) 30 MG tablet Take 30 mg by mouth daily.   Yes [provider]  timolol (TIMOPTIC) 0.25 % ophthalmic solution  01/13/14   [provider]      VITAL SIGNS:  Blood pressure 124/61, pulse (!) 59, temperature 98.5 F (36.9 C), temperature source Oral, resp. rate 14, height _0  (1.651 m), weight 108.9 kg (240 lb), SpO2 91 %.  PHYSICAL EXAMINATION:   Physical Exam  GENERAL:  71 y.o.-year-old patient lying in the bed with no acute distress.  EYES: Pupils equal, round, reactive to light and accommodation. No scleral icterus. Extraocular muscles intact.  HEENT: Head atraumatic, normocephalic. Oropharynx and nasopharynx clear. Right facial droop noted. NECK:  Supple, no jugular venous distention. No thyroid enlargement, no tenderness.  LUNGS: Normal breath sounds bilaterally, no wheezing, rales,rhonchi or crepitation. No use of accessory muscles of respiration. Decreased bibasilar breath sounds. CARDIOVASCULAR: S1, S2 normal. No murmurs, rubs, or gallops.  ABDOMEN: Soft, nontender,  nondistended. Bowel sounds present. No organomegaly or mass.  EXTREMITIES: No  cyanosis, or clubbing. 2+ bilateral pedal edema noted NEUROLOGIC: Cranial nerves II through XII are intact. Has some word finding deficits. Muscle strength 5/5 in all extremities. Sensation intact. Gait not checked.  PSYCHIATRIC: The patient is alert and oriented x 2-3 with cognitive deficits.  SKIN: No obvious rash, lesion, or ulcer.   LABORATORY PANEL:   CBC  Recent Labs Lab 10/22/16 0928  WBC 5.4  HGB 9.3*  HCT 28.3*  PLT 400   ------------------------------------------------------------------------------------------------------------------  Chemistries   Recent Labs Lab 10/22/16 0928  NA 116*  K 4.5  CL 81*  CO2 27  GLUCOSE 162*  BUN 8  CREATININE 0.49  CALCIUM 8.8*  AST 25  ALT 17  ALKPHOS 45  BILITOT 0.4   ------------------------------------------------------------------------------------------------------------------  Cardiac Enzymes  Recent Labs Lab 10/22/16 0928  TROPONINI <0.03   ------------------------------------------------------------------------------------------------------------------  RADIOLOGY:  Ct Head Wo Contrast  Result Date: 10/22/2016 CLINICAL DATA:  Confusion.  Abnormal sodium level. EXAM: CT HEAD WITHOUT CONTRAST TECHNIQUE: Contiguous axial images were obtained from the base of the skull through the vertex  without intravenous contrast. COMPARISON:  None. FINDINGS: Brain: Mild age related atrophy. No evidence of old or acute focal infarction, mass lesion, hemorrhage, hydrocephalus or extra-axial collection. Vascular: There is atherosclerotic calcification of the major vessels at the base of the brain. Skull: Negative Sinuses/Orbits: Clear/normal Other: None significant IMPRESSION: No acute or significant finding.  Mild/minimal age related atrophy. Electronically Signed   By: Nelson Chimes M.D.   On: 10/22/2016 10:39    EKG:   Orders placed or performed  during the hospital encounter of 10/22/16  . ED EKG  . ED EKG  . EKG 12-Lead  . EKG 12-Lead    IMPRESSION AND PLAN:   Susan Barajas  is a 71 y.o. female with a known history of Insulin-dependent diabetes mellitus, hypertension, asthma, glaucoma presents to Hospital secondary to slurred speech and word finding difficulties for almost a week now.  #1 acute metabolic encephalopathy-could be stroke versus from hyponatremia. -Due to significant risk factors, we will order an MRI to rule out any acute infarct. Started on aspirin and statin. Check lipid panel. -Neuro checks, PT/OT and speech therapy consults. CT of the head did not show any acute infarcts but does have chronic small vessel ischemic changes.  #2 hyponatremia-discontinue hydrochlorothiazide. IV fluids and monitor sodium every 6 hours. Goal of sodium rise in the next 24 hours should be 10-12 mEq  #3 diabetes mellitus-continue Lantus, metformin, Actos and added sliding scale insulin.  #4 glaucoma-continue eyedrops.  #5 DVT prophylaxis-Lovenox   All the records are reviewed and case discussed with ED provider. Management plans discussed with the patient, family and they are in agreement.  CODE STATUS: Full code  TOTAL TIME TAKING CARE OF THIS PATIENT: 50 minutes.    Gladstone Lighter M.D on 10/22/2016 at 12:53 PM  Between 7am to 6pm - Pager - [phone]  After 6pm go to [email or web address] - password [email or web address] [facility]  Office  [phone]  CC: Primary care physician; Albina Billet, MD

## 2016-10-22 NOTE — ED Notes (Signed)
Patient transported to MRI 

## 2016-10-22 NOTE — Progress Notes (Addendum)
SLP Cancellation Note  Patient Details Name: Susan Barajas MRN: [id number] DOB: May 04, 1945   Cancelled treatment:       Reason Eval/Treat Not Completed: SLP screened, no needs identified, will sign off (chart reviewed; NSG consulted; met w/ pt and family). Pt was admitted to the floor by lunch and has just finished a lunch meal of several foods and liquids on tray - ~90+% consumed. Family present in room. Pt is alert, verbally conversive.  Pt denied any difficulty swallowing and is currently on a regular diet; no deficits or overt s/s of aspiration noted during the lunch meal per pt and family. Pt tolerates swallowing pills w/ water per NSG. Pt conversed w/ SLP and family regarding self and her lunch meal, but min slowness noted overall. Pt's SODIUM level is at a CRITICAL low - this could be impacting pt's communication abilities. Pt is being taken to tests at this time; orderly present. Noted MRI results: "Atrophy and small vessel disease.  No acute intracranial findings". No further skilled ST services for swallowing indicated as pt appears at her baseline. Pt agreed. ST services will f/u tomorrow to make further assessment of speech-language abilities(as Sodium levels improve). Pt and family agreed; NSG updated. Recommend general aspiration precautions.     Orinda Kenner, MS, CCC-SLP Watson,Katherine 10/22/2016, 4:28 PM

## 2016-10-22 NOTE — Progress Notes (Signed)
Patient's MRI was negative for stroke, per Dr. Tressia Miners can discontinue neuro checks and NIH.

## 2016-10-22 NOTE — Progress Notes (Signed)
Ms. Benancio Deeds has been at [facility] attending her mother Ms. [place] Dant who is acutely ill and being admitted to the hospital on 10/22/16. So please kindly grant her time away from work for this day.  Please call if any questions/concerns.   Gladstone Lighter, M.D [facility] [address] [place], [place] [address] Ph: [phone]

## 2016-10-22 NOTE — ED Notes (Signed)
Patient made aware of need of urine sample. States she is unable to void at this time.

## 2016-10-22 NOTE — ED Notes (Signed)
Patient transported to CT at this time. 

## 2016-10-22 NOTE — ED Notes (Signed)
Attempted to call report x1.

## 2016-10-22 NOTE — ED Provider Notes (Signed)
[facility] Emergency Department Provider Note    First MD Initiated Contact with Patient 10/22/16 [phone]     (approximate)  I have reviewed the triage vital signs and the nursing notes.   HISTORY  Chief Complaint Abnormal Lab    HPI Susan Barajas is a 71 y.o. female who presents with chief complaint of slurred speech and word finding difficulties for the past week. Patient was seen by her primary care doctor have blood work drawn at that time. We'll yesterday got a red flag that her sodium was low and she was called to have a repeat test repeated test was again even worse than before. She is directed by her primary care physician and come the ER for further evaluation. Has never had issues with hyponatremia in the past. Denies any chest pain or shortness of breath. No history of liver issues. Is not on diuretics.   Past Medical History:  Diagnosis Date  . Asthma 1950  . Diabetes ([place]) Age 40   Type 2; insulin dependent  . Glaucoma 1949  . Obesity, unspecified   . Personal history of tobacco use, presenting hazards to health   . Varicose veins of lower extremities with other complications    Family History  Problem Relation Age of Onset  . Prostate cancer Father   . Other Sister        mouth cancer   Past Surgical History:  Procedure Laterality Date  . BREAST BIOPSY Right 1991  . BREAST BIOPSY Left 2013  . COLONOSCOPY  2010   Dr. Vira Agar  . COLONOSCOPY  Jan 2016   Dr Vira Agar  . VEIN SURGERY Right 2006   Vein Closure Procedure; RF ablation of right GSV   Patient Active Problem List   Diagnosis Date Noted  . Abnormal mammogram with microcalcification 08/05/2013  . Fibrocystic breast disease 08/05/2012  . Varicose veins of lower extremities with other complications 64/40/3474      Prior to Admission medications   Medication Sig Start Date End Date Taking? Authorizing Provider  ferrous gluconate (FERGON) 325 MG tablet Take 325 mg  by mouth daily.    [provider]  HUMALOG KWIKPEN 100 UNIT/ML KiwkPen Inject 8 Units into the skin 2 (two) times daily.  07/03/15   [provider]  hydrochlorothiazide (HYDRODIURIL) 25 MG tablet Take 25 mg by mouth daily.    [provider]  insulin glargine (LANTUS) 100 UNIT/ML injection Inject 32 Units into the skin daily.     [provider]  metFORMIN (GLUCOPHAGE) 1000 MG tablet Take 1,000 mg by mouth 2 (two) times daily with a meal.    [provider]  Multiple Vitamin (MULTIVITAMIN) tablet Take 1 tablet by mouth daily.    [provider]  pioglitazone (ACTOS) 30 MG tablet Take 30 mg by mouth daily.    [provider]  simvastatin (ZOCOR) 20 MG tablet Take 20 mg by mouth daily.    [provider]  timolol (TIMOPTIC) 0.25 % ophthalmic solution  01/13/14   [provider]    Allergies Patient has no known allergies.    Social History Social History  Substance Use Topics  . Smoking status: Former Smoker    Packs/day: 1.00    Years: 20.00  . Smokeless tobacco: Never Used  . Alcohol use No    Review of Systems Patient denies headaches, rhinorrhea, blurry vision, numbness, shortness of breath, chest pain, edema, cough, abdominal pain, nausea, vomiting, diarrhea, dysuria, fevers,  rashes or hallucinations unless otherwise stated above in HPI. ____________________________________________   PHYSICAL EXAM:  VITAL SIGNS: Vitals:   10/22/16 0900  BP: (!) 164/77  Pulse: 82  Resp: 16  Temp: 98.5 F (36.9 C)    Constitutional: Well appearing and in no acute distress. Eyes: Conjunctivae are normal.  Head: Atraumatic. Nose: No congestion/rhinnorhea. Mouth/Throat: Mucous membranes are moist.   Neck: No stridor. Painless ROM.  Cardiovascular: Normal rate, regular rhythm. Grossly normal heart sounds.  Good peripheral circulation. Respiratory: Normal respiratory effort.  No retractions. Lungs  CTAB. Gastrointestinal: Soft and nontender. No distention. No abdominal bruits. No CVA tenderness. Musculoskeletal: No lower extremity tenderness nor edema.  No joint effusions. Neurologic:  Normal speech but with some word finding difficulty. No gross focal neurologic deficits are appreciated. No facial droop Skin:  Skin is warm, dry and intact. No rash noted. Psychiatric: Mood and affect are normal. Speech and behavior are normal.  ____________________________________________   LABS (all labs ordered are listed, but only abnormal results are displayed)  No results found for this or any previous visit (from the past 24 hour(s)). ____________________________________________  EKG My review and personal interpretation at Time: 10:14   Indication: confusion  Rate: 60  Rhythm: sinus Axis: normal Other: no stemi, normal intervals ____________________________________________  RADIOLOGY  I personally reviewed all radiographic images ordered to evaluate for the above acute complaints and reviewed radiology reports and findings.  These findings were personally discussed with the patient.  Please see medical record for radiology report.  ____________________________________________   PROCEDURES  Procedure(s) performed:  Procedures    Critical Care performed: yes CRITICAL CARE Performed by: Merlyn Lot   Total critical care time: 30 minutes  Critical care time was exclusive of separately billable procedures and treating other patients.  Critical care was necessary to treat or prevent imminent or life-threatening deterioration.  Critical care was time spent personally by me on the following activities: development of treatment plan with patient and/or surrogate as well as nursing, discussions with consultants, evaluation of patient's response to treatment, examination of patient, obtaining history from patient or surrogate, ordering and performing treatments and interventions,  ordering and review of laboratory studies, ordering and review of radiographic studies, pulse oximetry and re-evaluation of patient's condition.  ____________________________________________   INITIAL IMPRESSION / ASSESSMENT AND PLAN / ED COURSE  Pertinent labs & imaging results that were available during my care of the patient were reviewed by me and considered in my medical decision making (see chart for details).  DDX: electrolye abn, dehydration, cva, uti,   Susan Barajas is a 71 y.o. who presents to the ED with above complaints. Blood work sent to evaluate for above differential. CT imaging ordered to evaluate for CVA shows no acute abnormality. Patient with acute hyponatremia for which I will give IV saline. Uncertain duration of her hyponatremia. Most likely secondary to medications. Patient will require admission to hospital for further evaluation and management.  Have discussed with the patient and available family all diagnostics and treatments performed thus far and all questions were answered to the best of my ability. The patient demonstrates understanding and agreement with plan.       ____________________________________________   FINAL CLINICAL IMPRESSION(S) / ED DIAGNOSES  Final diagnoses:  Acute metabolic encephalopathy  Hyponatremia      NEW MEDICATIONS STARTED DURING THIS VISIT:  New Prescriptions   No medications on file     Note:  This document was prepared using Dragon voice recognition software and  may include unintentional dictation errors.    Merlyn Lot, MD 10/22/16 1131

## 2016-10-22 NOTE — Progress Notes (Signed)
CRITICAL VALUE ALERT  Critical Value:  119 sodium Date & Time Notied:  10/22/2016 1455  Provider Notified: Dr. Tressia Miners Orders Received/Actions taken: no new orders

## 2016-10-23 ENCOUNTER — Inpatient Hospital Stay: Payer: Medicare Other

## 2016-10-23 ENCOUNTER — Inpatient Hospital Stay (HOSPITAL_COMMUNITY)
Admit: 2016-10-23 | Discharge: 2016-10-23 | Disposition: A | Discharge status: Home / Self Care | Payer: Medicare Other | Attending: Internal Medicine | Admitting: Internal Medicine

## 2016-10-23 DIAGNOSIS — I5030 Unspecified diastolic (congestive) heart failure: Secondary | ICD-10-CM

## 2016-10-23 LAB — GLUCOSE, CAPILLARY
Glucose-Capillary: 132 mg/dL — ABNORMAL HIGH (ref 65–99)
Glucose-Capillary: 166 mg/dL — ABNORMAL HIGH (ref 65–99)
Glucose-Capillary: 171 mg/dL — ABNORMAL HIGH (ref 65–99)
Glucose-Capillary: 224 mg/dL — ABNORMAL HIGH (ref 65–99)

## 2016-10-23 LAB — CBC
HEMATOCRIT: 28.6 % — AB (ref 35.0–47.0)
HEMOGLOBIN: 9.5 g/dL — AB (ref 12.0–16.0)
MCH: 28.4 pg (ref 26.0–34.0)
MCHC: 33.3 g/dL (ref 32.0–36.0)
MCV: 85.2 fL (ref 80.0–100.0)
PLATELETS: 383 10*3/uL (ref 150–440)
RBC: 3.36 MIL/uL — AB (ref 3.80–5.20)
RDW: 16.2 % — ABNORMAL HIGH (ref 11.5–14.5)
WBC: 6.1 10*3/uL (ref 3.6–11.0)

## 2016-10-23 LAB — BASIC METABOLIC PANEL
Anion gap: 5 (ref 5–15)
BUN: 8 mg/dL (ref 6–20)
CHLORIDE: 86 mmol/L — AB (ref 101–111)
CO2: 29 mmol/L (ref 22–32)
CREATININE: 0.48 mg/dL (ref 0.44–1.00)
Calcium: 8.7 mg/dL — ABNORMAL LOW (ref 8.9–10.3)
GFR calc non Af Amer: >60 mL/min (ref >60)
Glucose, Bld: 148 mg/dL — ABNORMAL HIGH (ref 65–99)
POTASSIUM: 4.5 mmol/L (ref 3.5–5.1)
Sodium: 120 mmol/L — ABNORMAL LOW (ref 135–145)

## 2016-10-23 LAB — HEMOGLOBIN A1C
Hgb A1c MFr Bld: 6.6 % — ABNORMAL HIGH (ref 4.8–5.6)
MEAN PLASMA GLUCOSE: 143 mg/dL

## 2016-10-23 LAB — SODIUM
SODIUM: 121 mmol/L — AB (ref 135–145)
SODIUM: 120 mmol/L — AB (ref 135–145)
Sodium: 121 mmol/L — ABNORMAL LOW (ref 135–145)

## 2016-10-23 MED ORDER — SODIUM CHLORIDE 0.9 % IV SOLN
INTRAVENOUS | Status: DC
  Administered 2016-10-23: 18:00:00 via INTRAVENOUS

## 2016-10-23 NOTE — Plan of Care (Signed)
Problem: Health Behavior/Discharge Planning: Goal: Ability to manage health-related needs will improve Outcome: Progressing Na 120 at present. ivfs cont  Labs in am.  Rt wrist brace in place at present time  For rt  Wrist  Tenderness and  Swelling as ordered. Echo this am. Mri neg cont to moniter na levels

## 2016-10-23 NOTE — Progress Notes (Signed)
OT Cancellation Note  Patient Details Name: Susan Barajas MRN: [id number] DOB: [birth year]   Cancelled Treatment:    Reason Eval/Treat Not Completed: OT screened, no needs identified, will sign off  Harrel Carina, MS,, OTR/L 10/23/2016, 10:33 AM

## 2016-10-23 NOTE — Evaluation (Signed)
Physical Therapy Evaluation Patient Details Name: Susan Barajas MRN: [id number] DOB: 1945/12/30 Today's Date: 10/23/2016   History of Present Illness  Pt is a 71 yo F admitted to acute care with impaired speech and altered gait on 7/11, and was later dx with low sodium levles. Prior to admission, pt independent with all ADL's and IADL's, using no AD and driving. PMH: asthma, DM2, glaucoma, HTN, obesity, tobacco use, and vericose veins.  Clinical Impression  Pt presents with mild confusion and impulsivity, as seen by inconsistencies with verbalizing the difference between friend/family I the room, and with tendency to spontaneously release IV with amb. Pt performs bed mobility with minA, only to assist with managing linens. Transfers performed with supervision and ambulation with CGA, due to impulsivity, impaired safety awareness, and mild impairments in balance. Pt amb total of 400 ft with rest break after ~280 ft and then rest after 20 ft for toileting. Pt performed DGI with score of 8/12, indicating impaired balance with dynamic mobility. 5x STS performed in 14.42 sec, indicating impaired strength and power to B LE's. Pt presents with the following deficits: strength, power, and balance. Overall, pt responded well to today's treatment with no adverse affects. Pt no longer with need for skilled PT services inhouse, but would benefit from continued outpatient services, pending d/c, to address previously mentioned impairments. Should pt show decline, PT treat inhouse advised.     Follow Up Recommendations Outpatient PT    Equipment Recommendations  None recommended by PT    Recommendations for Other Services       Precautions / Restrictions Precautions Precautions: Fall Precaution Comments: impulsive and slight unsteadiness on feet.  Restrictions Weight Bearing Restrictions: No      Mobility  Bed Mobility Overal bed mobility: Needs Assistance Bed Mobility: Supine to Sit      Supine to sit: (P) Min assist     General bed mobility comments: (P) Pt MInA with supine to sit, requiring assist with managing sheets/blankets, but no physical asssit required.   Transfers Overall transfer level: Needs assistance   Transfers: Sit to/from Stand Sit to Stand: Supervision         General transfer comment: Supervision with STS transfers with increased postural sway noted on immediate stand. No cues required for correct body mechanics.   Ambulation/Gait Ambulation/Gait assistance: Min guard Ambulation Distance (Feet): 400 Feet Assistive device: None (Pushed IV pole ) Gait Pattern/deviations: Step-through pattern;Decreased weight shift to right     General Gait Details: Pt with slight unsteadiness towards R during amb. Amb to amb with reciprical gait for max 400 ft, resting once after ~280 ft. Required min verbal cues for safety awareness as pt would impulsively release grip on IV pole and continue amb, pulling on line. Pt education: cont to hold pole for steadiness and to avoid pulling out IV.   Stairs            Wheelchair Mobility    Modified Rankin (Stroke Patients Only)       Balance Overall balance assessment: Needs assistance Sitting-balance support: Bilateral upper extremity supported;Feet supported Sitting balance-Leahy Scale: Good Sitting balance - Comments: ModI with sitting balance, requiring no cues for safety    Standing balance support: Single extremity supported Standing balance-Leahy Scale: Good Standing balance comment: Supervision with static standing balance and CGA for dynamic standing balance, requires unilateral UE support from IV pole.                  Standardized Balance  Assessment Standardized Balance Assessment : Dynamic Gait Index   Dynamic Gait Index Level Surface: Mild Impairment Change in Gait Speed: Mild Impairment Gait with Horizontal Head Turns: Mild Impairment Gait with Vertical Head Turns: Mild  Impairment       Pertinent Vitals/Pain Pain Assessment: No/denies pain    Home Living Family/patient expects to be discharged to:: Private residence Living Arrangements: Other relatives (brother) Available Help at Discharge: Family Type of Home: House Home Access: Level entry     Home Layout: One level Home Equipment: None Additional Comments: independent with no AD upon admission.     Prior Function Level of Independence: Independent               Hand Dominance        Extremity/Trunk Assessment   Upper Extremity Assessment Upper Extremity Assessment: Overall WFL for tasks assessed    Lower Extremity Assessment Lower Extremity Assessment: Generalized weakness (MMT at 4+/5 to B LE's )       Communication   Communication: No difficulties  Cognition Arousal/Alertness: Awake/alert Behavior During Therapy: Impulsive Overall Cognitive Status: Within Functional Limits for tasks assessed                                 General Comments: Pt inconsistent and slightly confused. When asked if guest was friend or family, pt answered friend and guest answered family. Pt also impulsive and lacks safety awareness.       General Comments      Exercises Other Exercises Other Exercises: Seated therex at EOB to B LE's with supervision x10 reps: marches and LAQ's Other Exercises: toileting: pt supervision with management of clothing due to slightly impaired balance. Rewquired no cues for safety.    Assessment/Plan    PT Assessment Patient needs continued PT services  PT Problem List Decreased strength;Decreased balance;Decreased safety awareness       PT Treatment Interventions DME instruction;Gait training;Functional mobility training;Therapeutic activities;Therapeutic exercise;Balance training;Neuromuscular re-education;Patient/family education    PT Goals (Current goals can be found in the Care Plan section)  Acute Rehab PT Goals Patient Stated  Goal: to go home  PT Goal Formulation: With patient Time For Goal Achievement: 11/06/16 Potential to Achieve Goals: Good    Frequency (P) Other (Comment) (d/c inhouse)   Barriers to discharge        Co-evaluation               AM-PAC PT "6 Clicks" Daily Activity  Outcome Measure Difficulty turning over in bed (including adjusting bedclothes, sheets and blankets)?: Total Difficulty moving from lying on back to sitting on the side of the bed? : A Little Difficulty sitting down on and standing up from a chair with arms (e.g., wheelchair, bedside commode, etc,.)?: A Little Help needed moving to and from a bed to chair (including a wheelchair)?: A Little Help needed walking in hospital room?: A Little Help needed climbing 3-5 steps with a railing? : A Little 6 Click Score: 16    End of Session Equipment Utilized During Treatment: Gait belt Activity Tolerance: Patient tolerated treatment well Patient left: in bed;with call bell/phone within reach;with bed alarm set Nurse Communication: Mobility status PT Visit Diagnosis: Unsteadiness on feet (R26.81);Other abnormalities of gait and mobility (R26.89)    Time: 1122-1140 PT Time Calculation (min) (ACUTE ONLY): 18 min   Charges:         PT G Codes:  Oran Rein PT, SPT   Bevelyn Ngo 10/23/2016, 1:36 PM

## 2016-10-23 NOTE — Progress Notes (Signed)
*  PRELIMINARY RESULTS* Echocardiogram 2D Echocardiogram has been performed.  Susan Barajas 10/23/2016, 9:43 AM

## 2016-10-23 NOTE — Progress Notes (Signed)
Patient ID: Susan Barajas, female   DOB: [birth year], 71 y.o.   MRN: [id number]  [facility] PROGRESS NOTE  Susan Barajas:[id number] DOB: July 13, 1945 DOA: 10/22/2016 PCP: Albina Billet, MD  HPI/Subjective: Patient feeling okay. She was told to come in secondary to a low sodium. She takes hydrochlorothiazide as outpatient. Patient does complain of some right wrist pain. Patient had difficulty getting her words out at home.  Objective: Vitals:   10/23/16 1013 10/23/16 1144  BP: (!) 142/58 139/78  Pulse: 79 71  Resp: 17 20  Temp: 98.3 F (36.8 C) 98 F (36.7 C)    Filed Weights   10/22/16 0901  Weight: 108.9 kg (240 lb)    ROS: Review of Systems  Constitutional: Negative for chills and fever.  Eyes: Negative for blurred vision.  Respiratory: Negative for cough and shortness of breath.   Cardiovascular: Negative for chest pain.  Gastrointestinal: Negative for abdominal pain, constipation, diarrhea, nausea and vomiting.  Genitourinary: Negative for dysuria.  Musculoskeletal: Positive for joint pain.  Neurological: Negative for dizziness and headaches.   Exam: Physical Exam  Constitutional: She is oriented to person, place, and time.  HENT:  Nose: No mucosal edema.  Mouth/Throat: No oropharyngeal exudate or posterior oropharyngeal edema.  Eyes: Pupils are equal, round, and reactive to light. Conjunctivae, EOM and lids are normal.  Neck: No JVD present. Carotid bruit is not present. No edema present. No thyroid mass and no thyromegaly present.  Cardiovascular: S1 normal and S2 normal.  Exam reveals no gallop.   No murmur heard. Pulses:      Dorsalis pedis pulses are 2+ on the right side, and 2+ on the left side.  Respiratory: No respiratory distress. She has no wheezes. She has no rhonchi. She has no rales.  GI: Soft. Bowel sounds are normal. There is no tenderness.  Musculoskeletal:       Right ankle: She exhibits swelling.       Left ankle: She exhibits  swelling.  Lymphadenopathy:    She has no cervical adenopathy.  Neurological: She is alert and oriented to person, place, and time. No cranial nerve deficit.  Skin: Skin is warm. No rash noted. Nails show no clubbing.  Psychiatric: She has a normal mood and affect.      Data Reviewed: Basic Metabolic Panel:  Recent Labs Lab 10/22/16 0928 10/22/16 1424 10/22/16 1814 10/23/16 0025 10/23/16 0546 10/23/16 1242  NA 116* 119* 117* 120* 121* 120*  K 4.5  --   --  4.5  --   --   CL 81*  --   --  86*  --   --   CO2 27  --   --  29  --   --   GLUCOSE 162*  --   --  148*  --   --   BUN 8  --   --  8  --   --   CREATININE 0.49  --   --  0.48  --   --   CALCIUM 8.8*  --   --  8.7*  --   --    Liver Function Tests:  Recent Labs Lab 10/22/16 0928  AST 25  ALT 17  ALKPHOS 45  BILITOT 0.4  PROT 6.8  ALBUMIN 3.4*   CBC:  Recent Labs Lab 10/22/16 0928 10/23/16 0025  WBC 5.4 6.1  NEUTROABS 4.0  --   HGB 9.3* 9.5*  HCT 28.3* 28.6*  MCV 83.5 85.2  PLT  400 383   Cardiac Enzymes:  Recent Labs Lab 10/22/16 0928  TROPONINI <0.03    CBG:  Recent Labs Lab 10/22/16 1749 10/22/16 2147 10/23/16 0734 10/23/16 1142  GLUCAP 166* 234* 132* 166*       Studies: Dg Wrist Complete Right  Result Date: 10/23/2016 CLINICAL DATA:  Pain EXAM: RIGHT WRIST - COMPLETE 3+ VIEW COMPARISON:  None. FINDINGS: Frontal, oblique, and lateral views were obtained. There is no fracture or dislocation. Joint spaces appear normal. No erosive change or intra-articular calcification. IMPRESSION: No fracture or dislocation.  No evident arthropathy. Electronically Signed   By: Lowella Grip III M.D.   On: 10/23/2016 09:50   Ct Head Wo Contrast  Result Date: 10/22/2016 CLINICAL DATA:  Confusion.  Abnormal sodium level. EXAM: CT HEAD WITHOUT CONTRAST TECHNIQUE: Contiguous axial images were obtained from the base of the skull through the vertex without intravenous contrast. COMPARISON:  None.  FINDINGS: Brain: Mild age related atrophy. No evidence of old or acute focal infarction, mass lesion, hemorrhage, hydrocephalus or extra-axial collection. Vascular: There is atherosclerotic calcification of the major vessels at the base of the brain. Skull: Negative Sinuses/Orbits: Clear/normal Other: None significant IMPRESSION: No acute or significant finding.  Mild/minimal age related atrophy. Electronically Signed   By: Nelson Chimes M.D.   On: 10/22/2016 10:39   Mr Brain Wo Contrast  Result Date: 10/22/2016 CLINICAL DATA:  Slurred speech and word-finding difficulties for 1 week. Metabolic abnormalities with low serum Sodium of 116. EXAM: MRI HEAD WITHOUT CONTRAST TECHNIQUE: Multiplanar, multiecho pulse sequences of the brain and surrounding structures were obtained without intravenous contrast. COMPARISON:  CT head earlier today. FINDINGS: Brain: No evidence for acute infarction, hemorrhage, mass lesion, hydrocephalus, or extra-axial fluid. Mild atrophy. Mild subcortical and periventricular T2 and FLAIR hyperintensities, likely chronic microvascular ischemic change. Vascular: Flow voids are maintained throughout the carotid, basilar, and vertebral arteries. There are no areas of chronic hemorrhage. Skull and upper cervical spine: Unremarkable visualized calvarium, skullbase, and cervical vertebrae. Pituitary, pineal, cerebellar tonsils unremarkable. No upper cervical cord lesions. Sinuses/Orbits: No orbital masses or proptosis. Globes appear symmetric. Sinuses appear well aerated, without evidence for air-fluid level. Other: None. IMPRESSION: Atrophy and small vessel disease.  No acute intracranial findings. No MR findings to suggest osmotic demyelination syndrome in this patient with abnormally low sodium. Electronically Signed   By: Staci Righter M.D.   On: 10/22/2016 14:47   US Carotid Bilateral  Result Date: 10/22/2016 CLINICAL DATA:  CVA EXAM: BILATERAL CAROTID DUPLEX ULTRASOUND TECHNIQUE: Pearline Cables  scale imaging, color Doppler and duplex ultrasound were performed of bilateral carotid and vertebral arteries in the neck. COMPARISON:  None. FINDINGS: Criteria: Quantification of carotid stenosis is based on velocity parameters that correlate the residual internal carotid diameter with NASCET-based stenosis levels, using the diameter of the distal internal carotid lumen as the denominator for stenosis measurement. The following velocity measurements were obtained: RIGHT ICA:  86 cm/sec CCA:  161 cm/sec SYSTOLIC ICA/CCA RATIO:  0.8 DIASTOLIC ICA/CCA RATIO:  1.8 ECA:  81 cm/sec LEFT ICA:  78 cm/sec CCA:  95 cm/sec SYSTOLIC ICA/CCA RATIO:  0.8 DIASTOLIC ICA/CCA RATIO:  0.8 ECA:  81 cm/sec RIGHT CAROTID ARTERY: Mild plaque in the bulb and internal carotid artery low resistance internal carotid Doppler pattern is preserved. RIGHT VERTEBRAL ARTERY:  Antegrade. LEFT CAROTID ARTERY: There is focal plaque in the bulb and lower internal carotid. Low resistance internal carotid Doppler pattern. LEFT VERTEBRAL ARTERY:  Antegrade. IMPRESSION: Less than 50% stenosis in  the right and left internal carotid arteries. Electronically Signed   By: Marybelle Killings M.D.   On: 10/22/2016 17:35   US Venous Img Lower Bilateral  Result Date: 10/22/2016 CLINICAL DATA:  Lower extremity swelling. Patient reports swelling for decades. EXAM: BILATERAL LOWER EXTREMITY VENOUS DOPPLER ULTRASOUND TECHNIQUE: Gray-scale sonography with graded compression, as well as color Doppler and duplex ultrasound were performed to evaluate the lower extremity deep venous systems from the level of the common femoral vein and including the common femoral, femoral, profunda femoral, popliteal and calf veins including the posterior tibial, peroneal and gastrocnemius veins when visible. The superficial great saphenous vein was also interrogated. Spectral Doppler was utilized to evaluate flow at rest and with distal augmentation maneuvers in the common femoral,  femoral and popliteal veins. COMPARISON:  None. FINDINGS: RIGHT LOWER EXTREMITY Common Femoral Vein: No evidence of thrombus. Normal compressibility, respiratory phasicity and response to augmentation. Saphenofemoral Junction: No evidence of thrombus. Normal compressibility and flow on color Doppler imaging. Profunda Femoral Vein: No evidence of thrombus. Normal compressibility and flow on color Doppler imaging. Femoral Vein: No evidence of thrombus. Normal compressibility, respiratory phasicity and response to augmentation. Popliteal Vein: No evidence of thrombus. Normal compressibility, respiratory phasicity and response to augmentation. Calf Veins: No evidence of thrombus. Normal compressibility and flow on color Doppler imaging. Superficial Great Saphenous Vein: No evidence of thrombus. Normal compressibility and flow on color Doppler imaging. Venous Reflux:  None. Other Findings:  None. LEFT LOWER EXTREMITY Common Femoral Vein: No evidence of thrombus. Normal compressibility, respiratory phasicity and response to augmentation. Saphenofemoral Junction: No evidence of thrombus. Normal compressibility and flow on color Doppler imaging. Profunda Femoral Vein: No evidence of thrombus. Normal compressibility and flow on color Doppler imaging. Femoral Vein: No evidence of thrombus. Normal compressibility, respiratory phasicity and response to augmentation. Popliteal Vein: No evidence of thrombus. Normal compressibility, respiratory phasicity and response to augmentation. Calf Veins: No evidence of thrombus. Normal compressibility and flow on color Doppler imaging. Superficial Great Saphenous Vein: No evidence of thrombus. Normal compressibility and flow on color Doppler imaging. Venous Reflux:  None. Other Findings: Fluid in the popliteal fossa measuring 5.1 x 1.1 x 2.4 cm consistent with Baker cyst. IMPRESSION: No evidence of DVT within either lower extremity. Electronically Signed   By: Jeb Levering M.D.   On:  10/22/2016 17:20    Scheduled Meds: . aspirin  81 mg Oral Daily  . atorvastatin  40 mg Oral q1800  . docusate sodium  100 mg Oral BID  . enoxaparin (LOVENOX) injection  40 mg Subcutaneous Q24H  . insulin aspart  0-15 Units Subcutaneous TID WC  . insulin aspart  0-5 Units Subcutaneous QHS  . insulin glargine  32 Units Subcutaneous Daily  . latanoprost  1 drop Both Eyes QHS  . metFORMIN  1,000 mg Oral Daily  . multivitamin with minerals  1 tablet Oral Q breakfast  . pioglitazone  30 mg Oral Daily  . timolol  1 drop Both Eyes Daily    Assessment/Plan:  1. Hyponatremia. Continue IV fluid hydration. Likely secondary to hydrochlorothiazide. Follow serial sodiums. 2. Type 2 diabetes mellitus on glargine insulin and sliding scale and metformin and Actos. 3. Hyperlipidemia unspecified on atorvastatin 4. Essential hypertension. Watch closely with eating off hydrochlorothiazide 5. Right wrist pain and swelling. X-ray negative. Right wrist brace. Can consider x-ray in 10 days. 6. Glaucoma unspecified on latanoprost and timolol  Code Status:     Code Status Orders  Start     Ordered   10/22/16 1434  Full code  Continuous     10/22/16 1433    Code Status History    Date Active Date Inactive Code Status Order ID Comments User Context   This patient has a current code status but no historical code status.     Family Communication: Family at bedside Disposition Plan: Home once sodium close to the normal range  Time spent: 28 minutes  [place], [place]

## 2016-10-24 LAB — BASIC METABOLIC PANEL
Anion gap: 6 (ref 5–15)
BUN: 7 mg/dL (ref 6–20)
CALCIUM: 8.5 mg/dL — AB (ref 8.9–10.3)
CO2: 28 mmol/L (ref 22–32)
CREATININE: 0.46 mg/dL (ref 0.44–1.00)
Chloride: 89 mmol/L — ABNORMAL LOW (ref 101–111)
GFR calc Af Amer: >60 mL/min (ref >60)
GLUCOSE: 108 mg/dL — AB (ref 65–99)
Potassium: 4.5 mmol/L (ref 3.5–5.1)
Sodium: 123 mmol/L — ABNORMAL LOW (ref 135–145)

## 2016-10-24 LAB — GLUCOSE, CAPILLARY
GLUCOSE-CAPILLARY: 122 mg/dL — AB (ref 65–99)
Glucose-Capillary: 119 mg/dL — ABNORMAL HIGH (ref 65–99)
Glucose-Capillary: 155 mg/dL — ABNORMAL HIGH (ref 65–99)
Glucose-Capillary: 142 mg/dL — ABNORMAL HIGH (ref 65–99)

## 2016-10-24 LAB — SODIUM, URINE, RANDOM: Sodium, Ur: 100 mmol/L

## 2016-10-24 LAB — OSMOLALITY, URINE: OSMOLALITY UR: 386 mosm/kg (ref 300–900)

## 2016-10-24 MED ORDER — FUROSEMIDE 20 MG PO TABS
20.0000 mg | ORAL_TABLET | Freq: Every day | ORAL | Status: DC
  Administered 2016-10-24: 20 mg via ORAL
  Filled 2016-10-24: qty 1

## 2016-10-24 MED ORDER — SODIUM CHLORIDE 1 G PO TABS
1.0000 g | ORAL_TABLET | Freq: Two times a day (BID) | ORAL | Status: DC
  Administered 2016-10-24 – 2016-10-26 (×5): 1 g via ORAL
  Filled 2016-10-24 (×6): qty 1

## 2016-10-24 NOTE — Progress Notes (Addendum)
SLP Cancellation Note  Patient Details Name: Susan Barajas Shakir MRN: [id number] DOB: 03/07/1946   Cancelled treatment:       Reason Eval/Treat Not Completed: SLP screened, no needs identified, will sign off (chart reviewed; consulted pt then NSG re: status today) Met w/ pt and a family friend who were in conversation w/ one another upon entering room. Pt ws conversing w/ her, then w/ SLP, at conversational level w/out deficits noted; pt and family friend denied any speech-language deficits stating she was talking "as usual". Pt does seem to take her time when telling her story of how she was admitted to the hospital; speech was intelligible and appropriate. Sodium levels still remain min lower per Labs but MD is addressing.   Pt is also eating a regular diet w/out deficits per pt and NSG staff. Pt denied any difficulty swallowing and is currently her regular diet; tolerates swallowing pills w/ water per NSG.  No further skilled ST services indicated at this time as pt appears at baseline per pt, friend. NSG to reconsult if any change in status while admitted. Pt can f/u w/ primary MD after discharge if desiring a referral to outpatient ST services for any f/u. Pt agreed.   Orinda Kenner, MS, CCC-SLP Sadiq Mccauley 10/24/2016, 10:12 AM

## 2016-10-24 NOTE — Progress Notes (Signed)
Patient ID: Susan Barajas, female   DOB: February 11, 1946, 71 y.o.   MRN: [id number]  [facility] PROGRESS NOTE  Susan Barajas VPX:[id number] DOB: 1946-02-08 DOA: 10/22/2016 PCP: Albina Billet, MD  HPI/Subjective: Patient feels okay and offers no complaints. Patient states that she drinks 4 containers of water. I advised her to cut that back to 2-1/2. No further neurological symptoms.  Objective: Vitals:   10/24/16 0748 10/24/16 1154  BP: (!) 154/70 (!) 135/58  Pulse: 81 66  Resp: 20 20  Temp: 97.6 F (36.4 C) 98.1 F (36.7 C)    Filed Weights   10/22/16 0901  Weight: 108.9 kg (240 lb)    ROS: Review of Systems  Constitutional: Negative for chills and fever.  Eyes: Negative for blurred vision.  Respiratory: Negative for cough and shortness of breath.   Cardiovascular: Negative for chest pain.  Gastrointestinal: Negative for abdominal pain, constipation, diarrhea, nausea and vomiting.  Genitourinary: Negative for dysuria.  Musculoskeletal: Positive for joint pain.  Neurological: Negative for dizziness and headaches.   Exam: Physical Exam  Constitutional: She is oriented to person, place, and time.  HENT:  Nose: No mucosal edema.  Mouth/Throat: No oropharyngeal exudate or posterior oropharyngeal edema.  Eyes: Pupils are equal, round, and reactive to light. Conjunctivae, EOM and lids are normal.  Neck: No JVD present. Carotid bruit is not present. No edema present. No thyroid mass and no thyromegaly present.  Cardiovascular: S1 normal and S2 normal.  Exam reveals no gallop.   No murmur heard. Pulses:      Dorsalis pedis pulses are 2+ on the right side, and 2+ on the left side.  Respiratory: No respiratory distress. She has no wheezes. She has no rhonchi. She has no rales.  GI: Soft. Bowel sounds are normal. There is no tenderness.  Musculoskeletal:       Right ankle: She exhibits swelling.       Left ankle: She exhibits swelling.  Lymphadenopathy:    She has  no cervical adenopathy.  Neurological: She is alert and oriented to person, place, and time. No cranial nerve deficit.  Skin: Skin is warm. No rash noted. Nails show no clubbing.  Psychiatric: She has a normal mood and affect.      Data Reviewed: Basic Metabolic Panel:  Recent Labs Lab 10/22/16 0928  10/23/16 0025 10/23/16 0546 10/23/16 1242 10/23/16 1819 10/24/16 0349  NA 116*  < > 120* 121* 120* 121* 123*  K 4.5  --  4.5  --   --   --  4.5  CL 81*  --  86*  --   --   --  89*  CO2 27  --  29  --   --   --  28  GLUCOSE 162*  --  148*  --   --   --  108*  BUN 8  --  8  --   --   --  7  CREATININE 0.49  --  0.48  --   --   --  0.46  CALCIUM 8.8*  --  8.7*  --   --   --  8.5*  < > = values in this interval not displayed. Liver Function Tests:  Recent Labs Lab 10/22/16 0928  AST 25  ALT 17  ALKPHOS 45  BILITOT 0.4  PROT 6.8  ALBUMIN 3.4*   CBC:  Recent Labs Lab 10/22/16 0928 10/23/16 0025  WBC 5.4 6.1  NEUTROABS 4.0  --  HGB 9.3* 9.5*  HCT 28.3* 28.6*  MCV 83.5 85.2  PLT 400 383   Cardiac Enzymes:  Recent Labs Lab 10/22/16 0928  TROPONINI <0.03    CBG:  Recent Labs Lab 10/23/16 1142 10/23/16 1632 10/23/16 2052 10/24/16 0733 10/24/16 1153  GLUCAP 166* 171* 224* 119* 122*       Studies: Dg Wrist Complete Right  Result Date: 10/23/2016 CLINICAL DATA:  Pain EXAM: RIGHT WRIST - COMPLETE 3+ VIEW COMPARISON:  None. FINDINGS: Frontal, oblique, and lateral views were obtained. There is no fracture or dislocation. Joint spaces appear normal. No erosive change or intra-articular calcification. IMPRESSION: No fracture or dislocation.  No evident arthropathy. Electronically Signed   By: Lowella Grip III M.D.   On: 10/23/2016 09:50   US Carotid Bilateral  Result Date: 10/22/2016 CLINICAL DATA:  CVA EXAM: BILATERAL CAROTID DUPLEX ULTRASOUND TECHNIQUE: Pearline Cables scale imaging, color Doppler and duplex ultrasound were performed of bilateral carotid and  vertebral arteries in the neck. COMPARISON:  None. FINDINGS: Criteria: Quantification of carotid stenosis is based on velocity parameters that correlate the residual internal carotid diameter with NASCET-based stenosis levels, using the diameter of the distal internal carotid lumen as the denominator for stenosis measurement. The following velocity measurements were obtained: RIGHT ICA:  86 cm/sec CCA:  336 cm/sec SYSTOLIC ICA/CCA RATIO:  0.8 DIASTOLIC ICA/CCA RATIO:  1.8 ECA:  81 cm/sec LEFT ICA:  78 cm/sec CCA:  95 cm/sec SYSTOLIC ICA/CCA RATIO:  0.8 DIASTOLIC ICA/CCA RATIO:  0.8 ECA:  81 cm/sec RIGHT CAROTID ARTERY: Mild plaque in the bulb and internal carotid artery low resistance internal carotid Doppler pattern is preserved. RIGHT VERTEBRAL ARTERY:  Antegrade. LEFT CAROTID ARTERY: There is focal plaque in the bulb and lower internal carotid. Low resistance internal carotid Doppler pattern. LEFT VERTEBRAL ARTERY:  Antegrade. IMPRESSION: Less than 50% stenosis in the right and left internal carotid arteries. Electronically Signed   By: Marybelle Killings M.D.   On: 10/22/2016 17:35   US Venous Img Lower Bilateral  Result Date: 10/22/2016 CLINICAL DATA:  Lower extremity swelling. Patient reports swelling for decades. EXAM: BILATERAL LOWER EXTREMITY VENOUS DOPPLER ULTRASOUND TECHNIQUE: Gray-scale sonography with graded compression, as well as color Doppler and duplex ultrasound were performed to evaluate the lower extremity deep venous systems from the level of the common femoral vein and including the common femoral, femoral, profunda femoral, popliteal and calf veins including the posterior tibial, peroneal and gastrocnemius veins when visible. The superficial great saphenous vein was also interrogated. Spectral Doppler was utilized to evaluate flow at rest and with distal augmentation maneuvers in the common femoral, femoral and popliteal veins. COMPARISON:  None. FINDINGS: RIGHT LOWER EXTREMITY Common Femoral  Vein: No evidence of thrombus. Normal compressibility, respiratory phasicity and response to augmentation. Saphenofemoral Junction: No evidence of thrombus. Normal compressibility and flow on color Doppler imaging. Profunda Femoral Vein: No evidence of thrombus. Normal compressibility and flow on color Doppler imaging. Femoral Vein: No evidence of thrombus. Normal compressibility, respiratory phasicity and response to augmentation. Popliteal Vein: No evidence of thrombus. Normal compressibility, respiratory phasicity and response to augmentation. Calf Veins: No evidence of thrombus. Normal compressibility and flow on color Doppler imaging. Superficial Great Saphenous Vein: No evidence of thrombus. Normal compressibility and flow on color Doppler imaging. Venous Reflux:  None. Other Findings:  None. LEFT LOWER EXTREMITY Common Femoral Vein: No evidence of thrombus. Normal compressibility, respiratory phasicity and response to augmentation. Saphenofemoral Junction: No evidence of thrombus. Normal compressibility and flow on color Doppler  imaging. Profunda Femoral Vein: No evidence of thrombus. Normal compressibility and flow on color Doppler imaging. Femoral Vein: No evidence of thrombus. Normal compressibility, respiratory phasicity and response to augmentation. Popliteal Vein: No evidence of thrombus. Normal compressibility, respiratory phasicity and response to augmentation. Calf Veins: No evidence of thrombus. Normal compressibility and flow on color Doppler imaging. Superficial Great Saphenous Vein: No evidence of thrombus. Normal compressibility and flow on color Doppler imaging. Venous Reflux:  None. Other Findings: Fluid in the popliteal fossa measuring 5.1 x 1.1 x 2.4 cm consistent with Baker cyst. IMPRESSION: No evidence of DVT within either lower extremity. Electronically Signed   By: Jeb Levering M.D.   On: 10/22/2016 17:20    Scheduled Meds: . aspirin  81 mg Oral Daily  . atorvastatin  40 mg Oral  q1800  . docusate sodium  100 mg Oral BID  . enoxaparin (LOVENOX) injection  40 mg Subcutaneous Q24H  . furosemide  20 mg Oral Daily  . insulin aspart  0-15 Units Subcutaneous TID WC  . insulin aspart  0-5 Units Subcutaneous QHS  . insulin glargine  32 Units Subcutaneous Daily  . latanoprost  1 drop Both Eyes QHS  . metFORMIN  1,000 mg Oral Daily  . multivitamin with minerals  1 tablet Oral Q breakfast  . pioglitazone  30 mg Oral Daily  . sodium chloride  1 g Oral BID WC  . timolol  1 drop Both Eyes Daily    Assessment/Plan:  1. Hyponatremia. Stop hydrochlorothiazide. Stop IV fluids. Start low-dose Lasix. Start sodium chloride tablets. Fluid restriction explained. Recheck sodium tomorrow. 2. Type 2 diabetes mellitus on glargine insulin and sliding scale and metformin and Actos. 3. Hyperlipidemia unspecified on atorvastatin 4. Essential hypertension. Watch closely with being off hydrochlorothiazide 5. Right wrist pain and swelling. X-ray negative. Right wrist brace. Can consider x-ray in 10 days. 6. Glaucoma unspecified on latanoprost and timolol  Code Status:     Code Status Orders        Start     Ordered   10/22/16 1434  Full code  Continuous     10/22/16 1433    Code Status History    Date Active Date Inactive Code Status Order ID Comments User Context   This patient has a current code status but no historical code status.     Family Communication: Permission to speak in front of this are at the bedside Disposition Plan: Home once sodium close to the normal range  Time spent: 24 minutes  Leslye Peer, [employer]

## 2016-10-25 LAB — GLUCOSE, CAPILLARY
Glucose-Capillary: 108 mg/dL — ABNORMAL HIGH (ref 65–99)
Glucose-Capillary: 143 mg/dL — ABNORMAL HIGH (ref 65–99)
Glucose-Capillary: 125 mg/dL — ABNORMAL HIGH (ref 65–99)
Glucose-Capillary: 212 mg/dL — ABNORMAL HIGH (ref 65–99)

## 2016-10-25 LAB — BASIC METABOLIC PANEL
ANION GAP: 6 (ref 5–15)
Anion gap: 7 (ref 5–15)
BUN: 11 mg/dL (ref 6–20)
BUN: 13 mg/dL (ref 6–20)
CO2: 30 mmol/L (ref 22–32)
CO2: 31 mmol/L (ref 22–32)
Calcium: 8.8 mg/dL — ABNORMAL LOW (ref 8.9–10.3)
Calcium: 8.6 mg/dL — ABNORMAL LOW (ref 8.9–10.3)
Chloride: 87 mmol/L — ABNORMAL LOW (ref 101–111)
Chloride: 84 mmol/L — ABNORMAL LOW (ref 101–111)
Creatinine, Ser: 0.45 mg/dL (ref 0.44–1.00)
Creatinine, Ser: 0.5 mg/dL (ref 0.44–1.00)
GFR calc Af Amer: >60 mL/min (ref >60)
GFR calc Af Amer: >60 mL/min (ref >60)
GFR calc non Af Amer: >60 mL/min (ref >60)
GLUCOSE: 130 mg/dL — AB (ref 65–99)
GLUCOSE: 201 mg/dL — AB (ref 65–99)
POTASSIUM: 4.3 mmol/L (ref 3.5–5.1)
POTASSIUM: 4.3 mmol/L (ref 3.5–5.1)
Sodium: 124 mmol/L — ABNORMAL LOW (ref 135–145)
Sodium: 121 mmol/L — ABNORMAL LOW (ref 135–145)

## 2016-10-25 LAB — SODIUM: Sodium: 124 mmol/L — ABNORMAL LOW (ref 135–145)

## 2016-10-25 MED ORDER — SODIUM CHLORIDE 1 G PO TABS: 1.0000 g | ORAL_TABLET | Freq: Two times a day (BID) | ORAL | 0 refills | Status: DC

## 2016-10-25 MED ORDER — FUROSEMIDE 20 MG PO TABS
20.0000 mg | ORAL_TABLET | Freq: Every day | ORAL | Status: DC
  Administered 2016-10-26: 20 mg via ORAL
  Filled 2016-10-25: qty 1

## 2016-10-25 MED ORDER — ASPIRIN 81 MG PO CHEW: 81.0000 mg | CHEWABLE_TABLET | Freq: Every day | ORAL | 0 refills | Status: AC

## 2016-10-25 MED ORDER — FUROSEMIDE 20 MG PO TABS: 20.0000 mg | ORAL_TABLET | Freq: Every day | ORAL | 0 refills | Status: DC

## 2016-10-25 MED ORDER — TOLVAPTAN 15 MG PO TABS
15.0000 mg | ORAL_TABLET | Freq: Once | ORAL | Status: AC
  Administered 2016-10-25: 15 mg via ORAL
  Filled 2016-10-25: qty 1

## 2016-10-25 NOTE — Progress Notes (Signed)
Patient ID: Susan Barajas, female   DOB: June 13, 1945, 71 y.o.   MRN: [id number]  [facility] PROGRESS NOTE  Susan Barajas [id number]:[id number] DOB: Oct 25, 1945 DOA: 10/22/2016 PCP: Albina Billet, MD  HPI/Subjective: Patient feels okay and offers no complaints. Patient states that she drinks 4 containers of water. I advised her to cut that back to 2-1/2. No further neurological symptoms.  Objective: Vitals:   10/24/16 2002 10/25/16 0403  BP: (!) 147/69 134/66  Pulse: 78 76  Resp: 18 19  Temp: 98.1 F (36.7 C) 98 F (36.7 C)    Filed Weights   10/22/16 0901  Weight: 108.9 kg (240 lb)    ROS: Review of Systems  Constitutional: Negative for chills and fever.  Eyes: Negative for blurred vision.  Respiratory: Negative for cough and shortness of breath.   Cardiovascular: Negative for chest pain.  Gastrointestinal: Negative for abdominal pain, constipation, diarrhea, nausea and vomiting.  Genitourinary: Negative for dysuria.  Musculoskeletal: Positive for joint pain.  Neurological: Negative for dizziness and headaches.   Exam: Physical Exam  Constitutional: She is oriented to person, place, and time.  HENT:  Nose: No mucosal edema.  Mouth/Throat: No oropharyngeal exudate or posterior oropharyngeal edema.  Eyes: Pupils are equal, round, and reactive to light. Conjunctivae, EOM and lids are normal.  Neck: No JVD present. Carotid bruit is not present. No edema present. No thyroid mass and no thyromegaly present.  Cardiovascular: S1 normal and S2 normal.  Exam reveals no gallop.   No murmur heard. Pulses:      Dorsalis pedis pulses are 2+ on the right side, and 2+ on the left side.  Respiratory: No respiratory distress. She has no wheezes. She has no rhonchi. She has no rales.  GI: Soft. Bowel sounds are normal. There is no tenderness.  Musculoskeletal:       Right ankle: She exhibits swelling.       Left ankle: She exhibits swelling.  Lymphadenopathy:    She has no  cervical adenopathy.  Neurological: She is alert and oriented to person, place, and time. No cranial nerve deficit.  Skin: Skin is warm. No rash noted. Nails show no clubbing.  Psychiatric: She has a normal mood and affect.      Data Reviewed: Basic Metabolic Panel:  Recent Labs Lab 10/22/16 0928  10/23/16 0025  10/23/16 1242 10/23/16 1819 10/24/16 0349 10/25/16 0454 10/25/16 0949  NA 116*  < > 120*  < > 120* 121* 123* 124* 121*  K 4.5  --  4.5  --   --   --  4.5 4.3 4.3  CL 81*  --  86*  --   --   --  89* 87* 84*  CO2 27  --  29  --   --   --  _0 GLUCOSE 162*  --  148*  --   --   --  108* 130* 201*  BUN 8  --  8  --   --   --  _1 CREATININE 0.49  --  0.48  --   --   --  0.46 0.45 0.50  CALCIUM 8.8*  --  8.7*  --   --   --  8.5* 8.8* 8.6*  < > = values in this interval not displayed. Liver Function Tests:  Recent Labs Lab 10/22/16 0928  AST 25  ALT 17  ALKPHOS 45  BILITOT 0.4  PROT 6.8  ALBUMIN 3.4*  CBC:  Recent Labs Lab 10/22/16 0928 10/23/16 0025  WBC 5.4 6.1  NEUTROABS 4.0  --   HGB 9.3* 9.5*  HCT 28.3* 28.6*  MCV 83.5 85.2  PLT 400 383   Cardiac Enzymes:  Recent Labs Lab 10/22/16 0928  TROPONINI <0.03    CBG:  Recent Labs Lab 10/24/16 0733 10/24/16 1153 10/24/16 1649 10/24/16 2100 10/25/16 0737  GLUCAP 119* 122* 155* 142* 108*       Studies: No results found.  Scheduled Meds: . aspirin  81 mg Oral Daily  . atorvastatin  40 mg Oral q1800  . docusate sodium  100 mg Oral BID  . enoxaparin (LOVENOX) injection  40 mg Subcutaneous Q24H  . [START ON 10/26/2016] furosemide  20 mg Oral Daily  . insulin aspart  0-15 Units Subcutaneous TID WC  . insulin aspart  0-5 Units Subcutaneous QHS  . insulin glargine  32 Units Subcutaneous Daily  . latanoprost  1 drop Both Eyes QHS  . metFORMIN  1,000 mg Oral Daily  . multivitamin with minerals  1 tablet Oral Q breakfast  . pioglitazone  30 mg Oral Daily  . sodium chloride  1 g  Oral BID WC  . timolol  1 drop Both Eyes Daily    Assessment/Plan:  1. Hyponatremia. Stopped hydrochlorothiazide. Started low-dose Lasix and sodium chloride tablets. Sodium today still low at 124. Case discussed with nephrologist Dr. Candiss Norse and we decided to give 1 dose Tolvaptan. Recheck sodium at 3 PM today. Case discussed with Gerald Stabs the nurse to call me with these results. Based on these results we will decide whether the patient can leave this afternoon or will stay until tomorrow morning and a recheck of the sodium. 2. Type 2 diabetes mellitus on glargine insulin and sliding scale and metformin and Actos. 3. Hyperlipidemia unspecified on atorvastatin 4. Essential hypertension. Watch closely with being off hydrochlorothiazide. 5. Right wrist pain and swelling. X-ray negative. Right wrist brace. Can consider x-ray in 10 days. 6. Glaucoma unspecified on latanoprost and timolol  Code Status:     Code Status Orders        Start     Ordered   10/22/16 1434  Full code  Continuous     10/22/16 1433    Code Status History    Date Active Date Inactive Code Status Order ID Comments User Context   This patient has a current code status but no historical code status.     Family Communication: Permission to speak in front of person at the bedside Disposition Plan: Potential home this afternoon after sodium blood draw versus tomorrow morning.  Time spent: 30 minutes  Loletha Grayer  [employer]

## 2016-10-26 LAB — BASIC METABOLIC PANEL
ANION GAP: 7 (ref 5–15)
BUN: 17 mg/dL (ref 6–20)
CALCIUM: 9.3 mg/dL (ref 8.9–10.3)
CO2: 30 mmol/L (ref 22–32)
Chloride: 96 mmol/L — ABNORMAL LOW (ref 101–111)
Creatinine, Ser: 0.5 mg/dL (ref 0.44–1.00)
Glucose, Bld: 153 mg/dL — ABNORMAL HIGH (ref 65–99)
Potassium: 4.3 mmol/L (ref 3.5–5.1)
Sodium: 133 mmol/L — ABNORMAL LOW (ref 135–145)

## 2016-10-26 LAB — GLUCOSE, CAPILLARY
Glucose-Capillary: 126 mg/dL — ABNORMAL HIGH (ref 65–99)
Glucose-Capillary: 225 mg/dL — ABNORMAL HIGH (ref 65–99)

## 2016-10-26 NOTE — Discharge Summary (Signed)
[facility] [place] NAME: Susan Barajas    MR#:  [id number]  DATE OF BIRTH:  [birth year]  DATE OF ADMISSION:  10/22/2016 ADMITTING PHYSICIAN: Gladstone Lighter, MD  DATE OF DISCHARGE: 10/26/2016 12:00 PM  PRIMARY CARE PHYSICIAN: Albina Billet, MD    ADMISSION DIAGNOSIS:  Swelling [R60.9] Hyponatremia [E87.1] CVA (cerebral vascular accident) ([place]) [I63.9] Cerebral infarction ([place]) [I63.9] CVA (cerebral infarction) [I63.9] Stroke-like episode ([place]) [Q55.0] Acute metabolic encephalopathy [I16.42]  DISCHARGE DIAGNOSIS:  Active Problems:   Hyponatremia   SECONDARY DIAGNOSIS:   Past Medical History:  Diagnosis Date  . Asthma 1950  . Diabetes ([place]) Age 71   Type 2; insulin dependent  . Glaucoma 1949  . Hypertension   . Obesity, unspecified   . Personal history of tobacco use, presenting hazards to health   . Varicose veins of lower extremities with other complications     HOSPITAL COURSE:   1. Hyponatremia. We stopped hydrochlorothiazide. We initially gave IV fluids which did not help the sodium so these were discontinued. We started low-dose Lasix and sodium chloride tablets. Since her sodium was still low, we gave 1 dose of tolvaptan. She responded appropriately and sodium was 133 upon discharge home. Continue salt tablets and low-dose Lasix upon going home. Recommend checking a BMP on follow-up visit. Continue to stay off hydrochlorothiazide. 2. Type 2 diabetes mellitus on glargine insulin and short acting insulin. Also on Actos and metformin 3. Hyperlipidemia unspecified on lovastatin 4. Essential hypertension. Blood pressure satisfactory on Lasix 5. Right wrist pain and swelling. X-ray was negative. Can consider an x-ray in 10 days if still having pain. 6. Glaucoma unspecified on latanoprost and timolol 7. Anemia can do further workup as outpatient  DISCHARGE CONDITIONS:   Satisfactory  CONSULTS OBTAINED:   none  DRUG  ALLERGIES:  No Known Allergies  DISCHARGE MEDICATIONS:   Discharge Medication List as of 10/26/2016 10:29 AM    START taking these medications   Details  aspirin 81 MG chewable tablet Chew 1 tablet (81 mg total) by mouth daily., Starting Sun 10/26/2016, Print    furosemide (LASIX) 20 MG tablet Take 1 tablet (20 mg total) by mouth daily., Starting Sun 10/26/2016, Print    sodium chloride 1 g tablet Take 1 tablet (1 g total) by mouth 2 (two) times daily with a meal., Starting Sat 10/25/2016, Print      CONTINUE these medications which have NOT CHANGED   Details  ferrous gluconate (FERGON) 325 MG tablet Take 325 mg by mouth daily., Historical Med    HUMALOG KWIKPEN 100 UNIT/ML KiwkPen Inject 10 Units into the skin 2 (two) times daily. , Starting Tue 07/03/2015, Historical Med    insulin glargine (LANTUS) 100 UNIT/ML injection Inject 32 Units into the skin daily. , Historical Med    latanoprost (XALATAN) 0.005 % ophthalmic solution 1 drop at bedtime., Historical Med    lovastatin (MEVACOR) 20 MG tablet Take 20 mg by mouth at bedtime., Historical Med    metFORMIN (GLUCOPHAGE) 1000 MG tablet Take 1,000 mg by mouth daily. , Historical Med    Multiple Vitamin (MULTIVITAMIN) tablet Take 1 tablet by mouth daily., Historical Med    pioglitazone (ACTOS) 30 MG tablet Take 30 mg by mouth daily., Historical Med    timolol (TIMOPTIC) 0.25 % ophthalmic solution Starting Fri 01/13/2014, Historical Med      STOP taking these medications     hydrochlorothiazide (HYDRODIURIL) 25 MG tablet  DISCHARGE INSTRUCTIONS:   Follow-up PMD one week Follow-up nephrology 1-2 weeks  If you experience worsening of your admission symptoms, develop shortness of breath, life threatening emergency, suicidal or homicidal thoughts you must seek medical attention immediately by calling 911 or calling your MD immediately  if symptoms less severe.  You Must read complete instructions/literature along with  all the possible adverse reactions/side effects for all the Medicines you take and that have been prescribed to you. Take any new Medicines after you have completely understood and accept all the possible adverse reactions/side effects.   Please note  You were cared for by a hospitalist during your hospital stay. If you have any questions about your discharge medications or the care you received while you were in the hospital after you are discharged, you can call the unit and asked to speak with the hospitalist on call if the hospitalist that took care of you is not available. Once you are discharged, your primary care physician will handle any further medical issues. Please note that NO REFILLS for any discharge medications will be authorized once you are discharged, as it is imperative that you return to your primary care physician (or establish a relationship with a primary care physician if you do not have one) for your aftercare needs so that they can reassess your need for medications and monitor your lab values.    Today   CHIEF COMPLAINT:   Chief Complaint  Patient presents with  . Abnormal Lab    HISTORY OF PRESENT ILLNESS:  Susan Barajas  is a 71 y.o. female sent in for low sodium   VITAL SIGNS:  Blood pressure 121/64, pulse (!) 106, temperature 98.2 F (36.8 C), temperature source Oral, resp. rate 18, height _0  (1.651 m), weight 108.9 kg (240 lb), SpO2 93 %.  Pulse when I saw her was 85    PHYSICAL EXAMINATION:  GENERAL:  71 y.o.-year-old patient lying in the bed with no acute distress.  EYES: Pupils equal, round, reactive to light and accommodation. No scleral icterus. Extraocular muscles intact.  HEENT: Head atraumatic, normocephalic. Oropharynx and nasopharynx clear.  NECK:  Supple, no jugular venous distention. No thyroid enlargement, no tenderness.  LUNGS: Normal breath sounds bilaterally, no wheezing, rales,rhonchi or crepitation. No use of accessory muscles of  respiration.  CARDIOVASCULAR: S1, S2 normal. No murmurs, rubs, or gallops.  ABDOMEN: Soft, non-tender, non-distended. Bowel sounds present. No organomegaly or mass.  EXTREMITIES: No pedal edema, cyanosis, or clubbing.  NEUROLOGIC: Cranial nerves II through XII are intact. Muscle strength 5/5 in all extremities. Sensation intact. Gait not checked.  PSYCHIATRIC: The patient is alert and oriented x 3.  SKIN: No obvious rash, lesion, or ulcer.   DATA REVIEW:   CBC  Recent Labs Lab 10/23/16 0025  WBC 6.1  HGB 9.5*  HCT 28.6*  PLT 383    Chemistries   Recent Labs Lab 10/22/16 0928  10/26/16 0416  NA 116*  < > 133*  K 4.5  < > 4.3  CL 81*  < > 96*  CO2 27  < > 30  GLUCOSE 162*  < > 153*  BUN 8  < > 17  CREATININE 0.49  < > 0.50  CALCIUM 8.8*  < > 9.3  AST 25  --   --   ALT 17  --   --   ALKPHOS 45  --   --   BILITOT 0.4  --   --   < > = values  in this interval not displayed.  Cardiac Enzymes  Recent Labs Lab 10/22/16 0928  TROPONINI <0.03       Management plans discussed with the patient, and she is in agreement.  CODE STATUS:     Code Status Orders        Start     Ordered   10/22/16 1434  Full code  Continuous     10/22/16 1433    Code Status History    Date Active Date Inactive Code Status Order ID Comments User Context   This patient has a current code status but no historical code status.      TOTAL TIME TAKING CARE OF THIS PATIENT: 35 minutes.    Loletha Grayer M.D on 10/26/2016 at 3:16 PM  Between 7am to 6pm - Pager - [phone]  After 6pm go to [email or web address] - password [email or web address] [facility]  [phone]  CC: Primary care physician; Albina Billet, MD

## 2016-10-26 NOTE — Progress Notes (Signed)
Pt requested insulin; states will eat within the hour with education done. Pt transported to private vehicle in transport chair. Discharge home to self care/sister accompanied pt.

## 2016-10-26 NOTE — Progress Notes (Signed)
Reports feeling much better and states she is ready to go home. Oral and written AVS instructions given with stated understanding. Prescriptions given and written rx for outpatient PT. She states waiting on family for transportation.

## 2016-11-14 ENCOUNTER — Inpatient Hospital Stay
Admission: EM | Admit: 2016-11-14 | Discharge: 2016-11-18 | DRG: 253 | Disposition: A | Discharge status: Home / Self Care | Payer: Medicare Other | Attending: Internal Medicine | Admitting: Internal Medicine

## 2016-11-14 ENCOUNTER — Encounter: Payer: Self-pay | Admitting: Medical Oncology

## 2016-11-14 ENCOUNTER — Encounter
Admission: EM | Disposition: A | Discharge status: Home / Self Care | Payer: Self-pay | Source: Home / Self Care | Attending: Internal Medicine

## 2016-11-14 ENCOUNTER — Emergency Department: Payer: Medicare Other

## 2016-11-14 DIAGNOSIS — R0902 Hypoxemia: Secondary | ICD-10-CM | POA: Diagnosis not present

## 2016-11-14 DIAGNOSIS — I824Z1 Acute embolism and thrombosis of unspecified deep veins of right distal lower extremity: Principal | ICD-10-CM | POA: Diagnosis present

## 2016-11-14 DIAGNOSIS — I82401 Acute embolism and thrombosis of unspecified deep veins of right lower extremity: Secondary | ICD-10-CM | POA: Diagnosis not present

## 2016-11-14 DIAGNOSIS — Z79899 Other long term (current) drug therapy: Secondary | ICD-10-CM

## 2016-11-14 DIAGNOSIS — E119 Type 2 diabetes mellitus without complications: Secondary | ICD-10-CM | POA: Diagnosis present

## 2016-11-14 DIAGNOSIS — H409 Unspecified glaucoma: Secondary | ICD-10-CM | POA: Diagnosis present

## 2016-11-14 DIAGNOSIS — Z7982 Long term (current) use of aspirin: Secondary | ICD-10-CM

## 2016-11-14 DIAGNOSIS — D649 Anemia, unspecified: Secondary | ICD-10-CM | POA: Diagnosis not present

## 2016-11-14 DIAGNOSIS — Z87891 Personal history of nicotine dependence: Secondary | ICD-10-CM

## 2016-11-14 DIAGNOSIS — Z8249 Family history of ischemic heart disease and other diseases of the circulatory system: Secondary | ICD-10-CM | POA: Diagnosis not present

## 2016-11-14 DIAGNOSIS — R600 Localized edema: Secondary | ICD-10-CM

## 2016-11-14 DIAGNOSIS — K921 Melena: Secondary | ICD-10-CM | POA: Diagnosis present

## 2016-11-14 DIAGNOSIS — E876 Hypokalemia: Secondary | ICD-10-CM | POA: Diagnosis not present

## 2016-11-14 DIAGNOSIS — M79604 Pain in right leg: Secondary | ICD-10-CM | POA: Diagnosis not present

## 2016-11-14 DIAGNOSIS — D500 Iron deficiency anemia secondary to blood loss (chronic): Secondary | ICD-10-CM | POA: Diagnosis not present

## 2016-11-14 DIAGNOSIS — Z794 Long term (current) use of insulin: Secondary | ICD-10-CM

## 2016-11-14 DIAGNOSIS — K922 Gastrointestinal hemorrhage, unspecified: Secondary | ICD-10-CM

## 2016-11-14 DIAGNOSIS — E871 Hypo-osmolality and hyponatremia: Secondary | ICD-10-CM | POA: Diagnosis present

## 2016-11-14 DIAGNOSIS — I119 Hypertensive heart disease without heart failure: Secondary | ICD-10-CM | POA: Diagnosis present

## 2016-11-14 DIAGNOSIS — E669 Obesity, unspecified: Secondary | ICD-10-CM | POA: Diagnosis present

## 2016-11-14 DIAGNOSIS — I82409 Acute embolism and thrombosis of unspecified deep veins of unspecified lower extremity: Secondary | ICD-10-CM | POA: Diagnosis not present

## 2016-11-14 DIAGNOSIS — Z6834 Body mass index (BMI) 34.0-34.9, adult: Secondary | ICD-10-CM | POA: Diagnosis not present

## 2016-11-14 HISTORY — PX: IVC FILTER INSERTION: CATH118245

## 2016-11-14 LAB — CBC WITH DIFFERENTIAL/PLATELET
BASOS ABS: 0.1 10*3/uL (ref 0–0.1)
Basophils Relative: 1 %
EOS PCT: 3 %
Eosinophils Absolute: 0.2 10*3/uL (ref 0–0.7)
HCT: 21.5 % — ABNORMAL LOW (ref 35.0–47.0)
Hemoglobin: 6.2 g/dL — ABNORMAL LOW (ref 12.0–16.0)
LYMPHS PCT: 9 %
Lymphs Abs: 0.8 10*3/uL — ABNORMAL LOW (ref 1.0–3.6)
MCH: 26.5 pg (ref 26.0–34.0)
MCHC: 28.9 g/dL — AB (ref 32.0–36.0)
MCV: 91.9 fL (ref 80.0–100.0)
MONO ABS: 0.6 10*3/uL (ref 0.2–0.9)
Monocytes Relative: 6 %
Neutro Abs: 7.4 10*3/uL — ABNORMAL HIGH (ref 1.4–6.5)
Neutrophils Relative %: 81 %
PLATELETS: 306 10*3/uL (ref 150–440)
RBC: 2.33 MIL/uL — ABNORMAL LOW (ref 3.80–5.20)
RDW: 18.7 % — AB (ref 11.5–14.5)
WBC: 9.1 10*3/uL (ref 3.6–11.0)

## 2016-11-14 LAB — CBC
HCT: 20.5 % — ABNORMAL LOW (ref 35.0–47.0)
Hemoglobin: 6.1 g/dL — ABNORMAL LOW (ref 12.0–16.0)
MCH: 27 pg (ref 26.0–34.0)
MCHC: 29.7 g/dL — ABNORMAL LOW (ref 32.0–36.0)
MCV: 91 fL (ref 80.0–100.0)
PLATELETS: 307 10*3/uL (ref 150–440)
RBC: 2.26 MIL/uL — AB (ref 3.80–5.20)
RDW: 18 % — ABNORMAL HIGH (ref 11.5–14.5)
WBC: 7.8 10*3/uL (ref 3.6–11.0)

## 2016-11-14 LAB — BASIC METABOLIC PANEL
ANION GAP: 8 (ref 5–15)
BUN: 16 mg/dL (ref 6–20)
CO2: 25 mmol/L (ref 22–32)
CREATININE: 0.75 mg/dL (ref 0.44–1.00)
Calcium: 8.6 mg/dL — ABNORMAL LOW (ref 8.9–10.3)
Chloride: 108 mmol/L (ref 101–111)
GFR calc Af Amer: >60 mL/min (ref >60)
GFR calc non Af Amer: >60 mL/min (ref >60)
GLUCOSE: 152 mg/dL — AB (ref 65–99)
Potassium: 4 mmol/L (ref 3.5–5.1)
Sodium: 141 mmol/L (ref 135–145)

## 2016-11-14 LAB — HEMOGLOBIN AND HEMATOCRIT, BLOOD
HCT: 22.9 % — ABNORMAL LOW (ref 35.0–47.0)
Hemoglobin: 7 g/dL — ABNORMAL LOW (ref 12.0–16.0)

## 2016-11-14 LAB — GLUCOSE, CAPILLARY
GLUCOSE-CAPILLARY: 106 mg/dL — AB (ref 65–99)
Glucose-Capillary: 70 mg/dL (ref 65–99)
Glucose-Capillary: 95 mg/dL (ref 65–99)

## 2016-11-14 LAB — PREPARE RBC (CROSSMATCH)

## 2016-11-14 LAB — ABO/RH: ABO/RH(D): B POS

## 2016-11-14 SURGERY — IVC FILTER INSERTION
Anesthesia: Moderate Sedation

## 2016-11-14 MED ORDER — IOPAMIDOL (ISOVUE-300) INJECTION 61%
INTRAVENOUS | Status: DC | PRN
  Administered 2016-11-14: 10 mL via INTRAVENOUS

## 2016-11-14 MED ORDER — CEFAZOLIN SODIUM-DEXTROSE 1-4 GM/50ML-% IV SOLN
INTRAVENOUS | Status: AC
  Filled 2016-11-14: qty 50

## 2016-11-14 MED ORDER — HEPARIN (PORCINE) IN NACL 2-0.9 UNIT/ML-% IJ SOLN
INTRAMUSCULAR | Status: AC
  Filled 2016-11-14: qty 500

## 2016-11-14 MED ORDER — DEXTROSE 50 % IV SOLN
25.0000 mL | Freq: Once | INTRAVENOUS | Status: AC
  Administered 2016-11-14: 25 mL via INTRAVENOUS

## 2016-11-14 MED ORDER — SODIUM CHLORIDE 1 G PO TABS
1.0000 g | ORAL_TABLET | Freq: Two times a day (BID) | ORAL | Status: DC
  Administered 2016-11-14 – 2016-11-18 (×7): 1 g via ORAL
  Filled 2016-11-14 (×10): qty 1

## 2016-11-14 MED ORDER — DEXTROSE 50 % IV SOLN
INTRAVENOUS | Status: AC
  Administered 2016-11-14: 25 mL via INTRAVENOUS
  Filled 2016-11-14: qty 50

## 2016-11-14 MED ORDER — LATANOPROST 0.005 % OP SOLN
1.0000 [drp] | Freq: Every day | OPHTHALMIC | Status: DC
  Administered 2016-11-14 – 2016-11-17 (×4): 1 [drp] via OPHTHALMIC
  Filled 2016-11-14: qty 2.5

## 2016-11-14 MED ORDER — POLYETHYLENE GLYCOL 3350 17 G PO PACK: 17.0000 g | PACK | Freq: Every day | ORAL | Status: DC | PRN

## 2016-11-14 MED ORDER — MIDAZOLAM HCL 2 MG/2ML IJ SOLN
INTRAMUSCULAR | Status: DC | PRN
  Administered 2016-11-14: 1 mg via INTRAVENOUS

## 2016-11-14 MED ORDER — FUROSEMIDE 20 MG PO TABS
20.0000 mg | ORAL_TABLET | Freq: Every day | ORAL | Status: DC
  Administered 2016-11-14 – 2016-11-18 (×5): 20 mg via ORAL
  Filled 2016-11-14 (×5): qty 1

## 2016-11-14 MED ORDER — FENTANYL CITRATE (PF) 100 MCG/2ML IJ SOLN
INTRAMUSCULAR | Status: AC
  Filled 2016-11-14: qty 2

## 2016-11-14 MED ORDER — ADULT MULTIVITAMIN W/MINERALS CH
1.0000 | ORAL_TABLET | Freq: Every day | ORAL | Status: DC
  Administered 2016-11-14 – 2016-11-18 (×3): 1 via ORAL
  Filled 2016-11-14 (×4): qty 1

## 2016-11-14 MED ORDER — CEFAZOLIN SODIUM-DEXTROSE 1-4 GM/50ML-% IV SOLN
1.0000 g | INTRAVENOUS | Status: AC
Start: 2016-11-15 — End: 2016-11-15
  Administered 2016-11-14: 1 g via INTRAVENOUS

## 2016-11-14 MED ORDER — ACETAMINOPHEN 325 MG PO TABS: 650.0000 mg | ORAL_TABLET | Freq: Four times a day (QID) | ORAL | Status: DC | PRN

## 2016-11-14 MED ORDER — TIMOLOL MALEATE 0.25 % OP SOLN
1.0000 [drp] | Freq: Two times a day (BID) | OPHTHALMIC | Status: DC
  Administered 2016-11-14 – 2016-11-18 (×8): 1 [drp] via OPHTHALMIC
  Filled 2016-11-14: qty 5

## 2016-11-14 MED ORDER — MIDAZOLAM HCL 5 MG/5ML IJ SOLN
INTRAMUSCULAR | Status: AC
  Filled 2016-11-14: qty 5

## 2016-11-14 MED ORDER — PRAVASTATIN SODIUM 20 MG PO TABS
20.0000 mg | ORAL_TABLET | Freq: Every day | ORAL | Status: DC
  Administered 2016-11-14 – 2016-11-17 (×3): 20 mg via ORAL
  Filled 2016-11-14 (×3): qty 1

## 2016-11-14 MED ORDER — LIDOCAINE HCL (PF) 1 % IJ SOLN
INTRAMUSCULAR | Status: AC
  Filled 2016-11-14: qty 30

## 2016-11-14 MED ORDER — PANTOPRAZOLE SODIUM 40 MG IV SOLR: 40.0000 mg | Freq: Two times a day (BID) | INTRAVENOUS | Status: DC

## 2016-11-14 MED ORDER — SODIUM CHLORIDE 0.9 % IV SOLN
10.0000 mL/h | Freq: Once | INTRAVENOUS | Status: AC
  Administered 2016-11-14: 10 mL/h via INTRAVENOUS

## 2016-11-14 MED ORDER — ACETAMINOPHEN 650 MG RE SUPP: 650.0000 mg | Freq: Four times a day (QID) | RECTAL | Status: DC | PRN

## 2016-11-14 MED ORDER — FENTANYL CITRATE (PF) 100 MCG/2ML IJ SOLN
INTRAMUSCULAR | Status: DC | PRN
  Administered 2016-11-14: 50 ug via INTRAVENOUS

## 2016-11-14 MED ORDER — INSULIN GLARGINE 100 UNIT/ML ~~LOC~~ SOLN
12.0000 [IU] | Freq: Every day | SUBCUTANEOUS | Status: DC
  Administered 2016-11-14 – 2016-11-16 (×3): 12 [IU] via SUBCUTANEOUS
  Filled 2016-11-14 (×3): qty 0.12

## 2016-11-14 MED ORDER — PANTOPRAZOLE SODIUM 40 MG IV SOLR
80.0000 mg | Freq: Once | INTRAVENOUS | Status: AC
  Administered 2016-11-14: 80 mg via INTRAVENOUS
  Filled 2016-11-14: qty 80

## 2016-11-14 MED ORDER — SODIUM CHLORIDE 0.9 % IV SOLN
8.0000 mg/h | INTRAVENOUS | Status: DC
  Administered 2016-11-14 – 2016-11-15 (×2): 8 mg/h via INTRAVENOUS
  Filled 2016-11-14 (×3): qty 80

## 2016-11-14 MED ORDER — FERROUS GLUCONATE 324 (38 FE) MG PO TABS
325.0000 mg | ORAL_TABLET | Freq: Every day | ORAL | Status: DC
  Administered 2016-11-14: 325 mg via ORAL
  Filled 2016-11-14 (×4): qty 1

## 2016-11-14 SURGICAL SUPPLY — 5 items
FILTER VC CELECT-FEMORAL (Filter) ×3 IMPLANT
NEEDLE ENTRY 21GA 7CM ECHOTIP (NEEDLE) ×3 IMPLANT
PACK ANGIOGRAPHY (CUSTOM PROCEDURE TRAY) ×3 IMPLANT
SET INTRO CAPELLA COAXIAL (SET/KITS/TRAYS/PACK) ×3 IMPLANT
WIRE J 3MM .035X145CM (WIRE) ×3 IMPLANT

## 2016-11-14 NOTE — ED Provider Notes (Signed)
[facility] Emergency Department Provider Note  ____________________________________________  Time seen: Approximately 8:10 AM  I have reviewed the triage vital signs and the nursing notes.   HISTORY  Chief Complaint Leg Swelling   HPI Susan Barajas is a 71 y.o. female with a history of diabetes, hypertension, varicose veins, hyponatremia who presents for evaluation of bilateral leg swelling. Patient reports progressively worsening symptoms over the course of the week. The swelling in both legs but worse on the right. She has no pain in her legs. No chest pain or shortness of breath. Patient was recently admitted for hyponatremia and had her diuretic dose recently decreased. She also is complaining of decreased level of energy for the last week. No nausea or vomiting, no confusion, no diarrhea, no fever or chills.  Past Medical History:  Diagnosis Date  . Asthma 1950  . 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 Glaucoma 1949  . Hypertension   . Obesity, unspecified   . Personal history of tobacco use, presenting hazards to health   . Varicose veins of lower extremities with other complications     Patient Active Problem List   Diagnosis Date Noted  . Hyponatremia 10/22/2016  . Abnormal mammogram with microcalcification 08/05/2013  . Fibrocystic breast disease 08/05/2012  . Varicose veins of lower extremities with other complications 41/63/8453    Past Surgical History:  Procedure Laterality Date  . BREAST BIOPSY Right 1991  . BREAST BIOPSY Left 2013  . COLONOSCOPY  2010   Dr. Vira Agar  . COLONOSCOPY  Jan 2016   Dr Vira Agar  . VEIN SURGERY Right 2006   Vein Closure Procedure; RF ablation of right GSV    Prior to Admission medications   Medication Sig Start Date End Date Taking? Authorizing Provider  aspirin 81 MG chewable tablet Chew 1 tablet (81 mg total) by mouth daily. 10/26/16  Yes Wieting, Richard, MD  ferrous  gluconate (FERGON) 325 MG tablet Take 325 mg by mouth daily.   Yes [provider]  furosemide (LASIX) 20 MG tablet Take 1 tablet (20 mg total) by mouth daily. 10/26/16  Yes Wieting, Richard, MD  HUMALOG KWIKPEN 100 UNIT/ML KiwkPen Inject 10 Units into the skin 2 (two) times daily.  07/03/15  Yes [provider]  insulin glargine (LANTUS) 100 UNIT/ML injection Inject 32 Units into the skin daily.    Yes [provider]  latanoprost (XALATAN) 0.005 % ophthalmic solution 1 drop at bedtime.   Yes [provider]  lovastatin (MEVACOR) 20 MG tablet Take 20 mg by mouth at bedtime.   Yes [provider]  metFORMIN (GLUCOPHAGE) 1000 MG tablet Take 1,000 mg by mouth daily.    Yes [provider]  Multiple Vitamin (MULTIVITAMIN) tablet Take 1 tablet by mouth daily.   Yes [provider]  pioglitazone (ACTOS) 30 MG tablet Take 15 mg by mouth daily.    Yes [provider]  sodium chloride 1 g tablet Take 1 tablet (1 g total) by mouth 2 (two) times daily with a meal. 10/25/16  Yes Wieting, Richard, MD  timolol (TIMOPTIC) 0.25 % ophthalmic solution Place 1 drop into both eyes 2 (two) times daily.  01/13/14  Yes [provider]    Allergies Patient has no known allergies.  Family History  Problem Relation Age of Onset  . Prostate cancer Father   . Other Sister        mouth cancer  . Hypertension  Mother     Social History Social History  Substance Use Topics  . Smoking status: Former Smoker    Packs/day: 1.00    Years: 20.00  . Smokeless tobacco: Never Used  . Alcohol use No    Review of Systems  Constitutional: Negative for fever. + fatigue Eyes: Negative for visual changes. ENT: Negative for sore throat. Neck: No neck pain  Cardiovascular: Negative for chest pain. Respiratory: Negative for shortness of breath. Gastrointestinal: Negative for abdominal pain, vomiting or diarrhea. Genitourinary: Negative for  dysuria. Musculoskeletal: Negative for back pain. + b/l leg swelling Skin: Negative for rash. Neurological: Negative for headaches, weakness or numbness. Psych: No SI or HI  ____________________________________________   PHYSICAL EXAM:  VITAL SIGNS: ED Triage Vitals  Enc Vitals Group     BP 11/14/16 0800 118/64     Pulse Rate 11/14/16 0800 94     Resp --      Temp 11/14/16 0800 98.7 F (37.1 C)     Temp Source 11/14/16 0800 Oral     SpO2 11/14/16 0800 (!) 89 %     Weight 11/14/16 0756 240 lb (108.9 kg)     Height 11/14/16 0756 _0  (1.651 m)     Head Circumference --      Peak Flow --      Pain Score 11/14/16 0756 0     Pain Loc --      Pain Edu? --      Excl. in [place]? --     Constitutional: Alert and oriented. Well appearing and in no apparent distress. HEENT:      Head: Normocephalic and atraumatic.         Eyes: Conjunctivae are normal. Sclera is non-icteric.       Mouth/Throat: Mucous membranes are moist.       Neck: Supple with no signs of meningismus. Cardiovascular: Regular rate and rhythm. No murmurs, gallops, or rubs. 2+ symmetrical distal pulses are present in all extremities. No JVD. Respiratory: Normal respiratory effort. Lungs are clear to auscultation bilaterally. No wheezes, crackles, or rhonchi.  Gastrointestinal: Soft, non tender, and non distended with positive bowel sounds. No rebound or guarding. Musculoskeletal: 2+ pitting edema up to bilateral knees with R>L Neurologic: Normal speech and language. Face is symmetric. Moving all extremities. No gross focal neurologic deficits are appreciated. Skin: Skin is warm, dry and intact. No rash noted. Psychiatric: Mood and affect are normal. Speech and behavior are normal.  ____________________________________________   LABS (all labs ordered are listed, but only abnormal results are displayed)  Labs Reviewed  CBC WITH DIFFERENTIAL/PLATELET - Abnormal; Notable for the following:       Result Value   RBC  2.33 (*)    Hemoglobin 6.2 (*)    HCT 21.5 (*)    MCHC 28.9 (*)    RDW 18.7 (*)    Neutro Abs 7.4 (*)    Lymphs Abs 0.8 (*)    All other components within normal limits  BASIC METABOLIC PANEL - Abnormal; Notable for the following:    Glucose, Bld 152 (*)    Calcium 8.6 (*)    All other components within normal limits  CBC  TYPE AND SCREEN  PREPARE RBC (CROSSMATCH)   ____________________________________________  EKG  none ____________________________________________  RADIOLOGY  Doppler: 1. Positive for isolated calf deep venous thrombosis within the paired posterior tibial veins of the right calf. No extension into the popliteal vein. 2. Positive for superficial thrombosis of the right great  saphenous vein beginning and the mid thigh and extending into the mid calf. 3. No evidence of deep or superficial venous thrombosis in the left lower extremity. 4. Left Baker's cyst.  ____________________________________________   PROCEDURES  Procedure(s) performed: None Procedures Critical Care performed: yes  CRITICAL CARE Performed by: Rudene Re  ?  Total critical care time: 35 min  Critical care time was exclusive of separately billable procedures and treating other patients.  Critical care was necessary to treat or prevent imminent or life-threatening deterioration.  Critical care was time spent personally by me on the following activities: development of treatment plan with patient and/or surrogate as well as nursing, discussions with consultants, evaluation of patient's response to treatment, examination of patient, obtaining history from patient or surrogate, ordering and performing treatments and interventions, ordering and review of laboratory studies, ordering and review of radiographic studies, pulse oximetry and re-evaluation of patient's condition.  ____________________________________________   INITIAL IMPRESSION / ASSESSMENT AND PLAN / ED  COURSE  71 y.o. female with a history of diabetes, hypertension, varicose veins, hyponatremia who presents for evaluation of bilateral leg swelling x 1 week. Patient recently admitted for hyponatremia with decreased diuretic regiment. She does have 2+ pitting edema bilateral lower extremities however worse on the right. Lungs are clear, normal sats, normal vital signs. Due to patient's recent hospital admission we'll do Doppler studies to rule out DVT. We'll check her kidney function and electrolytes and if those are within normal limits we'll increase her dose of Lasix one extra 20 when necessary for swelling.  Clinical Course as of Nov 14 1144  Fri Nov 14, 2016  1053 Ultrasound positive for acute DVT on the right lower extremity. Patient's hemoglobin is 6.2. Patient reports daily episodes of black tarry stool for several months which she attributed to the iron that she takes. The rectal exam showing melena which is Hemoccult positive. Patient remains hemodynamically stable. We'll hold off starting patient on anticoagulation at this time due to GI bleed. Patient will most likely need an IVC filter. Will type and screen and ordered 2 units of blood. She remains hemodynamically stable. Patient has a documented episode of 89% oxygenation on room air which was due to an abnormally placed pulse ox. Once that was fixed patient's oxygen levels have been 97-100%. She has no CP or SOB indicating concern for PE at this time. Will admit to Hospitalist.  [place], [place], MD    Pertinent labs & imaging results that were available during my care of the patient were reviewed by me and considered in my medical decision making (see chart for details).    ____________________________________________   FINAL CLINICAL IMPRESSION(S) / ED DIAGNOSES  Final diagnoses:  Acute deep vein thrombosis (DVT) of distal vein of right lower extremity (HCC)  Upper GI bleed       NEW MEDICATIONS STARTED DURING THIS VISIT:  New Prescriptions   No medications on file     Note:  This document was prepared using Dragon voice recognition software and may include unintentional dictation errors.    Alfred Levins, [place], MD 11/14/16 [phone]

## 2016-11-14 NOTE — Consult Note (Signed)
[facility] Vascular Consult Note  MRN : [id number]  Susan Barajas is a 71 y.o. ([birth year]) female who presents with chief complaint of  Chief Complaint  Patient presents with  . Leg Swelling  .  History of Present Illness: I have asked to evaluate the patient by Dr. Margaretmary Eddy for the possible need for a IVC filter.    The patient is a 71 year old woman who presented to [facility] earlier today complaining of increasing pain and swelling of the right lower extremity. Duplex ultrasound identified venous thrombus in both the right and left legs. However, she was also noted to have a hemoglobin of 16.2 and active rectal bleeding.  Current Facility-Administered Medications  Medication Dose Route Frequency Provider Last Rate Last Dose  . acetaminophen (TYLENOL) tablet 650 mg  650 mg Oral Q6H PRN Gouru, Aruna, MD       Or  . acetaminophen (TYLENOL) suppository 650 mg  650 mg Rectal Q6H PRN Gouru, Aruna, MD      . ceFAZolin (ANCEF) 1-4 GM/50ML-% IVPB           . ferrous gluconate (FERGON) tablet 325 mg  325 mg Oral Daily Gouru, Aruna, MD   325 mg at 11/14/16 1845  . furosemide (LASIX) tablet 20 mg  20 mg Oral Daily Gouru, Aruna, MD   20 mg at 11/14/16 1845  . insulin glargine (LANTUS) injection 12 Units  12 Units Subcutaneous Daily Gouru, Aruna, MD      . latanoprost (XALATAN) 0.005 % ophthalmic solution 1 drop  1 drop Both Eyes QHS Gouru, Aruna, MD      . multivitamin with minerals tablet 1 tablet  1 tablet Oral Daily Gouru, Aruna, MD   1 tablet at 11/14/16 1845  . pantoprazole (PROTONIX) 80 mg in sodium chloride 0.9 % 250 mL (0.32 mg/mL) infusion  8 mg/hr Intravenous Continuous Alfred Levins, [place], MD 25 mL/hr at 11/14/16 1846 8 mg/hr at 11/14/16 1846  . [START ON 11/18/2016] pantoprazole (PROTONIX) injection 40 mg  40 mg Intravenous Q12H [place], [place], MD      . polyethylene glycol (MIRALAX / GLYCOLAX) packet 17 g  17 g Oral Daily PRN Gouru,  Aruna, MD      . pravastatin (PRAVACHOL) tablet 20 mg  20 mg Oral q1800 Gouru, Aruna, MD   20 mg at 11/14/16 1845  . sodium chloride tablet 1 g  1 g Oral BID WC Gouru, Aruna, MD   1 g at 11/14/16 1845  . timolol (TIMOPTIC) 0.25 % ophthalmic solution 1 drop  1 drop Both Eyes BID Nicholes Mango, MD        Past Medical History:  Diagnosis Date  . Asthma 1950  . 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 Glaucoma 1949  . Hypertension   . Obesity, unspecified   . Personal history of tobacco use, presenting hazards to health   . Varicose veins of lower extremities with other complications     Past Surgical History:  Procedure Laterality Date  . BREAST BIOPSY Right 1991  . BREAST BIOPSY Left 2013  . COLONOSCOPY  2010   Dr. Vira Agar  . COLONOSCOPY  Jan 2016   Dr Vira Agar  . VEIN SURGERY Right 2006   Vein Closure Procedure; RF ablation of right GSV    Social History Social History  Substance Use Topics  . Smoking status: Former Smoker    Packs/day: 1.00    Years: 20.00  . Smokeless  tobacco: Never Used  . Alcohol use No    Family History Family History  Problem Relation Age of Onset  . Prostate cancer Father   . Other Sister        mouth cancer  . Hypertension Mother   No family history of bleeding/clotting disorders, porphyria or autoimmune disease   No Known Allergies   REVIEW OF SYSTEMS (Negative unless checked)  Constitutional: _0 Weight loss  _1 Fever  _2 Chills Cardiac: _3 Chest pain   _4 Chest pressure   _5 Palpitations   _6 Shortness of breath when laying flat   _7 Shortness of breath at rest   _8 Shortness of breath with exertion. Vascular:  _9 Pain in legs with walking   _10 Pain in legs at rest   _11 Pain in legs when laying flat   _12 Claudication   _13 Pain in feet when walking  _14 Pain in feet at rest  _15 Pain in feet when laying flat   _16 History of DVT   _17 Phlebitis   _18 Swelling in legs   _19 Varicose veins   _20 Non-healing ulcers Pulmonary:   _21 Uses home oxygen    _22 Productive cough   _23 Hemoptysis   _24 Wheeze  _25 COPD   _26 Asthma Neurologic:  _27 Dizziness  _28 Blackouts   _29 Seizures   _30 History of stroke   _31 History of TIA  _32 Aphasia   _33 Temporary blindness   _34 Dysphagia   _35 Weakness or numbness in arms   _36 Weakness or numbness in legs Musculoskeletal:  _37 Arthritis   _38 Joint swelling   _39 Joint pain   _40 Low back pain Hematologic:  _41 Easy bruising  _42 Easy bleeding   _43 Hypercoagulable state   _44 Anemic  _45 Hepatitis Gastrointestinal:  _46 Blood in stool   _47 Vomiting blood  _48 Gastroesophageal reflux/heartburn   _49 Difficulty swallowing. Genitourinary:  _50 Chronic kidney disease   _51 Difficult urination  _52 Frequent urination  _53 Burning with urination   _54 Blood in urine Skin:  _55 Rashes   _56 Ulcers   _57 Wounds Psychological:  _58 History of anxiety   _59  History of major depression.  Physical Examination  Vitals:   11/14/16 1700 11/14/16 1715 11/14/16 1731 11/14/16 1833  BP: (!) 117/59 (!) 119/58 (!) 122/50 (!) 108/57  Pulse:  80 79 81  Resp: _60 Temp:   98.1 F (36.7 C) 98.8 F (37.1 C)  TempSrc:   Oral Oral  SpO2: 94% 92% 93% 92%  Weight:   97 kg (213 lb 12.8 oz)   Height:   _61  (1.676 m)    Body mass index is 34.51 kg/m. Gen:  WD/WN, NAD Head: [place]/AT, No temporalis wasting. Prominent temp pulse not noted. Ear/Nose/Throat: Hearing grossly intact, nares w/o erythema or drainage, oropharynx w/o Erythema/Exudate Eyes: Sclera non-icteric, conjunctiva clear Neck: Trachea midline.  No JVD.  Pulmonary:  Good air movement, respirations not labored, equal bilaterally.  Cardiac: RRR, normal S1, S2. Vascular:   Mild venous stasis changes to the legs bilaterally.  3+ soft pitting edema palpable cord consistent with STP of the right great saphenous vein Vessel Right Left  Radial Palpable Palpable  PT Palpable Palpable  DP Palpable Palpable  Gastrointestinal: soft, non-tender/non-distended. No guarding/reflex.  Musculoskeletal: M/S 5/5 throughout.  Extremities  without ischemic changes.  No deformity or atrophy. No edema. Neurologic: Sensation grossly intact in extremities.  Symmetrical.  Speech is fluent. Motor exam as listed above. Psychiatric: Judgment intact, Mood & affect appropriate for pt's clinical situation. Dermatologic: No rashes or ulcers noted.  No cellulitis or open wounds. Lymph : No Cervical, Axillary, or Inguinal lymphadenopathy.      CBC Lab Results  Component Value Date   WBC  7.8 11/14/2016   HGB 7.0 (L) 11/14/2016   HCT 22.9 (L) 11/14/2016   MCV 91.0 11/14/2016   PLT 307 11/14/2016    BMET    Component Value Date/Time   NA 141 11/14/2016 0824   K 4.0 11/14/2016 0824   CL 108 11/14/2016 0824   CO2 25 11/14/2016 0824   GLUCOSE 152 (H) 11/14/2016 0824   BUN 16 11/14/2016 0824   CREATININE 0.75 11/14/2016 0824   CALCIUM 8.6 (L) 11/14/2016 0824   GFRNONAA >60 11/14/2016 0824   GFRAA >60 11/14/2016 0824   Estimated Creatinine Clearance: 75.8 mL/min (by C-G formula based on SCr of 0.75 mg/dL).  COAG Lab Results  Component Value Date   INR 1.00 10/22/2016    Radiology Dg Wrist Complete Right  Result Date: 10/23/2016 CLINICAL DATA:  Pain EXAM: RIGHT WRIST - COMPLETE 3+ VIEW COMPARISON:  None. FINDINGS: Frontal, oblique, and lateral views were obtained. There is no fracture or dislocation. Joint spaces appear normal. No erosive change or intra-articular calcification. IMPRESSION: No fracture or dislocation.  No evident arthropathy. Electronically Signed   By: Lowella Grip III M.D.   On: 10/23/2016 09:50   Ct Head Wo Contrast  Result Date: 10/22/2016 CLINICAL DATA:  Confusion.  Abnormal sodium level. EXAM: CT HEAD WITHOUT CONTRAST TECHNIQUE: Contiguous axial images were obtained from the base of the skull through the vertex without intravenous contrast. COMPARISON:  None. FINDINGS: Brain: Mild age related atrophy. No evidence of old or acute focal infarction, mass lesion, hemorrhage, hydrocephalus or  extra-axial collection. Vascular: There is atherosclerotic calcification of the major vessels at the base of the brain. Skull: Negative Sinuses/Orbits: Clear/normal Other: None significant IMPRESSION: No acute or significant finding.  Mild/minimal age related atrophy. Electronically Signed   By: Nelson Chimes M.D.   On: 10/22/2016 10:39   Mr Brain Wo Contrast  Result Date: 10/22/2016 CLINICAL DATA:  Slurred speech and word-finding difficulties for 1 week. Metabolic abnormalities with low serum Sodium of 116. EXAM: MRI HEAD WITHOUT CONTRAST TECHNIQUE: Multiplanar, multiecho pulse sequences of the brain and surrounding structures were obtained without intravenous contrast. COMPARISON:  CT head earlier today. FINDINGS: Brain: No evidence for acute infarction, hemorrhage, mass lesion, hydrocephalus, or extra-axial fluid. Mild atrophy. Mild subcortical and periventricular T2 and FLAIR hyperintensities, likely chronic microvascular ischemic change. Vascular: Flow voids are maintained throughout the carotid, basilar, and vertebral arteries. There are no areas of chronic hemorrhage. Skull and upper cervical spine: Unremarkable visualized calvarium, skullbase, and cervical vertebrae. Pituitary, pineal, cerebellar tonsils unremarkable. No upper cervical cord lesions. Sinuses/Orbits: No orbital masses or proptosis. Globes appear symmetric. Sinuses appear well aerated, without evidence for air-fluid level. Other: None. IMPRESSION: Atrophy and small vessel disease.  No acute intracranial findings. No MR findings to suggest osmotic demyelination syndrome in this patient with abnormally low sodium. Electronically Signed   By: Staci Righter M.D.   On: 10/22/2016 14:47   US Carotid Bilateral  Result Date: 10/22/2016 CLINICAL DATA:  CVA EXAM: BILATERAL CAROTID DUPLEX ULTRASOUND TECHNIQUE: Pearline Cables scale imaging, color Doppler and duplex ultrasound were performed of bilateral carotid and vertebral arteries in the neck. COMPARISON:   None. FINDINGS: Criteria: Quantification of carotid stenosis is based on velocity parameters that correlate the residual internal carotid diameter with NASCET-based stenosis levels, using the diameter of the distal internal carotid lumen as the denominator for stenosis measurement. The following velocity measurements were obtained: RIGHT ICA:  86 cm/sec CCA:  878 cm/sec SYSTOLIC ICA/CCA RATIO:  0.8 DIASTOLIC ICA/CCA RATIO:  1.8 ECA:  81 cm/sec LEFT ICA:  78 cm/sec CCA:  95 cm/sec SYSTOLIC ICA/CCA RATIO:  0.8 DIASTOLIC ICA/CCA RATIO:  0.8 ECA:  81 cm/sec RIGHT CAROTID ARTERY: Mild plaque in the bulb and internal carotid artery low resistance internal carotid Doppler pattern is preserved. RIGHT VERTEBRAL ARTERY:  Antegrade. LEFT CAROTID ARTERY: There is focal plaque in the bulb and lower internal carotid. Low resistance internal carotid Doppler pattern. LEFT VERTEBRAL ARTERY:  Antegrade. IMPRESSION: Less than 50% stenosis in the right and left internal carotid arteries. Electronically Signed   By: Marybelle Killings M.D.   On: 10/22/2016 17:35   US Venous Img Lower Bilateral  Result Date: 11/14/2016 CLINICAL DATA:  71 year old female with right lower extremity swelling for the past 6 days EXAM: BILATERAL LOWER EXTREMITY VENOUS DOPPLER ULTRASOUND TECHNIQUE: Gray-scale sonography with graded compression, as well as color Doppler and duplex ultrasound were performed to evaluate the lower extremity deep venous systems from the level of the common femoral vein and including the common femoral, femoral, profunda femoral, popliteal and calf veins including the posterior tibial, peroneal and gastrocnemius veins when visible. The superficial great saphenous vein was also interrogated. Spectral Doppler was utilized to evaluate flow at rest and with distal augmentation maneuvers in the common femoral, femoral and popliteal veins. COMPARISON:  None. FINDINGS: RIGHT LOWER EXTREMITY Common Femoral Vein: No evidence of thrombus. Normal  compressibility, respiratory phasicity and response to augmentation. Saphenofemoral Junction: No evidence of thrombus. Normal compressibility and flow on color Doppler imaging. Profunda Femoral Vein: No evidence of thrombus. Normal compressibility and flow on color Doppler imaging. Femoral Vein: No evidence of thrombus. Normal compressibility, respiratory phasicity and response to augmentation. Popliteal Vein: No evidence of thrombus. Normal compressibility, respiratory phasicity and response to augmentation. Calf Veins: The posterior tibial veins are noncompressible in the lumen serve filled with internal echogenic material consistent with acute thrombus. The peroneal veins are patent. Superficial Great Saphenous Vein: Noncompressible great saphenous vein with internal echogenic material in the lumen beginning in the mid thigh and extending into the proximal calf. The vessel is dilated. Venous Reflux:  None. Other Findings:  None. LEFT LOWER EXTREMITY Common Femoral Vein: No evidence of thrombus. Normal compressibility, respiratory phasicity and response to augmentation. Saphenofemoral Junction: No evidence of thrombus. Normal compressibility and flow on color Doppler imaging. Profunda Femoral Vein: No evidence of thrombus. Normal compressibility and flow on color Doppler imaging. Femoral Vein: No evidence of thrombus. Normal compressibility, respiratory phasicity and response to augmentation. Popliteal Vein: No evidence of thrombus. Normal compressibility, respiratory phasicity and response to augmentation. Calf Veins: No evidence of thrombus. Normal compressibility and flow on color Doppler imaging. Superficial Great Saphenous Vein: No evidence of thrombus. Normal compressibility and flow on color Doppler imaging. Venous Reflux:  None. Other Findings: Hypoechoic fluid collection in the popliteal fossa measures 5.4 x 1.0 x 4.1 cm consistent with a Baker cyst. IMPRESSION: 1. Positive for isolated calf deep venous  thrombosis within the paired posterior tibial veins of the right calf. No extension into the popliteal vein. 2. Positive for superficial thrombosis of the right great saphenous vein beginning and the mid thigh and extending into the mid calf. 3. No evidence of deep or superficial venous thrombosis in the left lower extremity. 4. Left Baker's cyst. Electronically Signed   By: Jacqulynn Cadet M.D.   On: 11/14/2016 10:23   US Venous Img Lower Bilateral  Result Date: 10/22/2016 CLINICAL DATA:  Lower extremity swelling. Patient reports swelling for decades. EXAM: BILATERAL  LOWER EXTREMITY VENOUS DOPPLER ULTRASOUND TECHNIQUE: Gray-scale sonography with graded compression, as well as color Doppler and duplex ultrasound were performed to evaluate the lower extremity deep venous systems from the level of the common femoral vein and including the common femoral, femoral, profunda femoral, popliteal and calf veins including the posterior tibial, peroneal and gastrocnemius veins when visible. The superficial great saphenous vein was also interrogated. Spectral Doppler was utilized to evaluate flow at rest and with distal augmentation maneuvers in the common femoral, femoral and popliteal veins. COMPARISON:  None. FINDINGS: RIGHT LOWER EXTREMITY Common Femoral Vein: No evidence of thrombus. Normal compressibility, respiratory phasicity and response to augmentation. Saphenofemoral Junction: No evidence of thrombus. Normal compressibility and flow on color Doppler imaging. Profunda Femoral Vein: No evidence of thrombus. Normal compressibility and flow on color Doppler imaging. Femoral Vein: No evidence of thrombus. Normal compressibility, respiratory phasicity and response to augmentation. Popliteal Vein: No evidence of thrombus. Normal compressibility, respiratory phasicity and response to augmentation. Calf Veins: No evidence of thrombus. Normal compressibility and flow on color Doppler imaging. Superficial Great Saphenous  Vein: No evidence of thrombus. Normal compressibility and flow on color Doppler imaging. Venous Reflux:  None. Other Findings:  None. LEFT LOWER EXTREMITY Common Femoral Vein: No evidence of thrombus. Normal compressibility, respiratory phasicity and response to augmentation. Saphenofemoral Junction: No evidence of thrombus. Normal compressibility and flow on color Doppler imaging. Profunda Femoral Vein: No evidence of thrombus. Normal compressibility and flow on color Doppler imaging. Femoral Vein: No evidence of thrombus. Normal compressibility, respiratory phasicity and response to augmentation. Popliteal Vein: No evidence of thrombus. Normal compressibility, respiratory phasicity and response to augmentation. Calf Veins: No evidence of thrombus. Normal compressibility and flow on color Doppler imaging. Superficial Great Saphenous Vein: No evidence of thrombus. Normal compressibility and flow on color Doppler imaging. Venous Reflux:  None. Other Findings: Fluid in the popliteal fossa measuring 5.1 x 1.1 x 2.4 cm consistent with Baker cyst. IMPRESSION: No evidence of DVT within either lower extremity. Electronically Signed   By: Jeb Levering M.D.   On: 10/22/2016 17:20      Assessment/Plan 1. DVT right lower extremity associated with superficial thrombophlebitis of the right lower extremity:  Given the patient's rectal bleeding anemia in association with the venous thrombus of the right lower extremity she cannot be anticoagulation is a high risk for propagation. Therefore I do recommend IVC filter placement. The risks and benefits as well as alternative therapies were reviewed all questions are answered patient agrees to proceed. Filter will be placed urgently. 2. GI bleed: Patient will be admitted resuscitated she'll be transfused as needed. 3. Anemia of blood loss: Patient will be transfused 2 units serial H&H will be followed   Hortencia Pilar, MD  11/14/2016 6:48 PM    This note was  created with Dragon medical transcription system.  Any error is purely unintentional

## 2016-11-14 NOTE — ED Triage Notes (Signed)
Pt reports she was started on iron last Thursday and since then she has been having increased BLE swelling. Denies pain/sob. Reports fatigue.

## 2016-11-14 NOTE — ED Notes (Signed)
Lab at bedside to collect repeat CBC.

## 2016-11-14 NOTE — H&P (Signed)
[place] [facility] [place] NAME: Susan Barajas    MR#:  [id number]  DATE OF BIRTH:  02-May-1945  DATE OF ADMISSION:  11/14/2016  PRIMARY CARE PHYSICIAN: Albina Billet, MD   REQUESTING/REFERRING PHYSICIAN: Rudene Re, MD  CHIEF COMPLAINT:   RLE swelling HISTORY OF PRESENT ILLNESS:  Susan Barajas  is a 71 y.o. female with a known history of Insulin requiring diabetes mellitus, hypertension, chronic iron deficiency anemia had EGD and colonoscopy done by Dr. Vira Agar one and half year ago which was normal is presenting to the ED with a chief complaint of right lower extremity pain and swelling. Venous Doppler was positive for right lower extremity DVT but her hemoglobin is at 6.2 and patient is reporting melena for the past several days. Her stool for Hemoccult is positive. 2 units of blood transition was ordered by the ED physician and hospitalist team is called to admit the patient. Patient denies any chest pain or shortness of breath. No other complaints denies any abdominal pain  PAST MEDICAL HISTORY:   Past Medical History:  Diagnosis Date  . Asthma 1950  . Diabetes ([place]) Age 64   Type 2; insulin dependent  . Glaucoma 1949  . Hypertension   . Obesity, unspecified   . Personal history of tobacco use, presenting hazards to health   . Varicose veins of lower extremities with other complications     PAST SURGICAL HISTOIRY:   Past Surgical History:  Procedure Laterality Date  . BREAST BIOPSY Right 1991  . BREAST BIOPSY Left 2013  . COLONOSCOPY  2010   Dr. Vira Agar  . COLONOSCOPY  Jan 2016   Dr Vira Agar  . VEIN SURGERY Right 2006   Vein Closure Procedure; RF ablation of right GSV    SOCIAL HISTORY:   Social History  Substance Use Topics  . Smoking status: Former Smoker    Packs/day: 1.00    Years: 20.00  . Smokeless tobacco: Never Used  . Alcohol use No    FAMILY HISTORY:   Family History  Problem Relation Age of  Onset  . Prostate cancer Father   . Other Sister        mouth cancer  . Hypertension Mother     DRUG ALLERGIES:  No Known Allergies  REVIEW OF SYSTEMS:  CONSTITUTIONAL: No fever, fatigue or weakness.  EYES: No blurred or double vision.  EARS, NOSE, AND THROAT: No tinnitus or ear pain.  RESPIRATORY: No cough, shortness of breath, wheezing or hemoptysis.  CARDIOVASCULAR: No chest pain, orthopnea, edema.  GASTROINTESTINAL: No nausea, vomiting, diarrhea or abdominal pain.Reporting melena GENITOURINARY: No dysuria, hematuria.  ENDOCRINE: No polyuria, nocturia,  HEMATOLOGY: No anemia, easy bruising or bleeding SKIN: No rash or lesion. MUSCULOSKELETAL:Reporting right calf tenderness No joint pain or arthritis.   NEUROLOGIC: No tingling, numbness, weakness.  PSYCHIATRY: No anxiety or depression.   MEDICATIONS AT HOME:   Prior to Admission medications   Medication Sig Start Date End Date Taking? Authorizing Provider  aspirin 81 MG chewable tablet Chew 1 tablet (81 mg total) by mouth daily. 10/26/16  Yes Wieting, Richard, MD  ferrous gluconate (FERGON) 325 MG tablet Take 325 mg by mouth daily.   Yes [provider]  furosemide (LASIX) 20 MG tablet Take 1 tablet (20 mg total) by mouth daily. 10/26/16  Yes Wieting, Richard, MD  HUMALOG KWIKPEN 100 UNIT/ML KiwkPen Inject 10 Units into the skin 2 (two) times daily.  07/03/15  Yes [provider]  insulin glargine (LANTUS) 100 UNIT/ML injection Inject 32 Units into the skin daily.    Yes [provider]  latanoprost (XALATAN) 0.005 % ophthalmic solution 1 drop at bedtime.   Yes [provider]  lovastatin (MEVACOR) 20 MG tablet Take 20 mg by mouth at bedtime.   Yes [provider]  metFORMIN (GLUCOPHAGE) 1000 MG tablet Take 1,000 mg by mouth daily.    Yes [provider]  Multiple Vitamin (MULTIVITAMIN) tablet Take 1 tablet by mouth daily.   Yes [provider]  pioglitazone (ACTOS)  30 MG tablet Take 15 mg by mouth daily.    Yes [provider]  sodium chloride 1 g tablet Take 1 tablet (1 g total) by mouth 2 (two) times daily with a meal. 10/25/16  Yes Wieting, Richard, MD  timolol (TIMOPTIC) 0.25 % ophthalmic solution Place 1 drop into both eyes 2 (two) times daily.  01/13/14  Yes [provider]      VITAL SIGNS:  Blood pressure (!) 106/53, pulse 83, temperature 98.6 F (37 C), temperature source Oral, resp. rate 19, height _0  (1.651 m), weight 108.9 kg (240 lb), SpO2 100 %.  PHYSICAL EXAMINATION:  GENERAL:  71 y.o.-year-old patient lying in the bed with no acute distress.  EYES: Pupils equal, round, reactive to light and accommodation. No scleral icterus. Extraocular muscles intact. Conjunctival pallor HEENT: Head atraumatic, normocephalic. Oropharynx and nasopharynx clear.  NECK:  Supple, no jugular venous distention. No thyroid enlargement, no tenderness.  LUNGS: Normal breath sounds bilaterally, no wheezing, rales,rhonchi or crepitation. No use of accessory muscles of respiration.  CARDIOVASCULAR: S1, S2 normal. No murmurs, rubs, or gallops.  ABDOMEN: Soft, nontender, nondistended. Bowel sounds present. No organomegaly or mass.  EXTREMITIES: Right lower extent is tender, edematous. No pedal edema, cyanosis, or clubbing.  NEUROLOGIC: Cranial nerves II through XII are intact. Muscle strength 5/5 in all extremities. Sensation intact. Gait not checked.  PSYCHIATRIC: The patient is alert and oriented x 3.  SKIN: No obvious rash, lesion, or ulcer.   LABORATORY PANEL:   CBC  Recent Labs Lab 11/14/16 1237  WBC 7.8  HGB 6.1*  HCT 20.5*  PLT 307   ------------------------------------------------------------------------------------------------------------------  Chemistries   Recent Labs Lab 11/14/16 0824  NA 141  K 4.0  CL 108  CO2 25  GLUCOSE 152*  BUN 16  CREATININE 0.75  CALCIUM 8.6*    ------------------------------------------------------------------------------------------------------------------  Cardiac Enzymes No results for input(s): TROPONINI in the last 168 hours. ------------------------------------------------------------------------------------------------------------------  RADIOLOGY:  US Venous Img Lower Bilateral  Result Date: 11/14/2016 CLINICAL DATA:  71 year old female with right lower extremity swelling for the past 6 days EXAM: BILATERAL LOWER EXTREMITY VENOUS DOPPLER ULTRASOUND TECHNIQUE: Gray-scale sonography with graded compression, as well as color Doppler and duplex ultrasound were performed to evaluate the lower extremity deep venous systems from the level of the common femoral vein and including the common femoral, femoral, profunda femoral, popliteal and calf veins including the posterior tibial, peroneal and gastrocnemius veins when visible. The superficial great saphenous vein was also interrogated. Spectral Doppler was utilized to evaluate flow at rest and with distal augmentation maneuvers in the common femoral, femoral and popliteal veins. COMPARISON:  None. FINDINGS: RIGHT LOWER EXTREMITY Common Femoral Vein: No evidence of thrombus. Normal compressibility, respiratory phasicity and response to augmentation. Saphenofemoral Junction: No evidence of thrombus. Normal compressibility and flow on color Doppler imaging. Profunda Femoral Vein: No evidence of thrombus. Normal compressibility and flow on color Doppler imaging. Femoral  Vein: No evidence of thrombus. Normal compressibility, respiratory phasicity and response to augmentation. Popliteal Vein: No evidence of thrombus. Normal compressibility, respiratory phasicity and response to augmentation. Calf Veins: The posterior tibial veins are noncompressible in the lumen serve filled with internal echogenic material consistent with acute thrombus. The peroneal veins are patent. Superficial Great Saphenous  Vein: Noncompressible great saphenous vein with internal echogenic material in the lumen beginning in the mid thigh and extending into the proximal calf. The vessel is dilated. Venous Reflux:  None. Other Findings:  None. LEFT LOWER EXTREMITY Common Femoral Vein: No evidence of thrombus. Normal compressibility, respiratory phasicity and response to augmentation. Saphenofemoral Junction: No evidence of thrombus. Normal compressibility and flow on color Doppler imaging. Profunda Femoral Vein: No evidence of thrombus. Normal compressibility and flow on color Doppler imaging. Femoral Vein: No evidence of thrombus. Normal compressibility, respiratory phasicity and response to augmentation. Popliteal Vein: No evidence of thrombus. Normal compressibility, respiratory phasicity and response to augmentation. Calf Veins: No evidence of thrombus. Normal compressibility and flow on color Doppler imaging. Superficial Great Saphenous Vein: No evidence of thrombus. Normal compressibility and flow on color Doppler imaging. Venous Reflux:  None. Other Findings: Hypoechoic fluid collection in the popliteal fossa measures 5.4 x 1.0 x 4.1 cm consistent with a Baker cyst. IMPRESSION: 1. Positive for isolated calf deep venous thrombosis within the paired posterior tibial veins of the right calf. No extension into the popliteal vein. 2. Positive for superficial thrombosis of the right great saphenous vein beginning and the mid thigh and extending into the mid calf. 3. No evidence of deep or superficial venous thrombosis in the left lower extremity. 4. Left Baker's cyst. Electronically Signed   By: Jacqulynn Cadet M.D.   On: 11/14/2016 10:23    EKG:   Orders placed or performed during the hospital encounter of 10/22/16  . ED EKG  . ED EKG  . EKG 12-Lead  . EKG 12-Lead    IMPRESSION AND PLAN:   Susan Barajas  is a 71 y.o. female with a known history of Insulin requiring diabetes mellitus, hypertension, chronic iron  deficiency anemia had EGD and colonoscopy done by Dr. Vira Agar one and half year ago which was normal is presenting to the ED with a chief complaint of right lower extremity pain and swelling. Venous Doppler was positive for right lower extremity DVT but her hemoglobin is at 6.2 and patient is reporting melena for the past several days. Her stool for Hemoccult is positive  #Symptomatic anemia with history of chronic iron deficiency anemia Admit to  telemetry Transfuse 2 units of blood, monitor hemoglobin and hematocrit GI consult is placed stool for Hemoccult is positive and patient reports melena Anemia workup  #Right lower extremity  DVT Nothing by mouth, IVC filter placement today by vascular surgery, d/w dr.Dew, will place IVCF asap Patient is asymptomatic anemia, melena and stool for Hemoccult is positive, hemoglobin is at 6.2, not considering to anticoagulate the patient at this time  #Insulin requiring diabetes mellitus Currently patient is nothing by mouth, will provide Lantus 12 units for basal coverage Sliding scale insulin  #Melena with stool for Hemoccult positive Nothing by mouth, PPI, GI consult is placed Monitor hemoglobin and hematocrit and transfuse as needed   All the records are reviewed and case discussed with ED provider. Management plans discussed with the patient, family and they are in agreement.  CODE STATUS: FC  TOTAL TIME TAKING CARE OF THIS PATIENT: 49mnutes.   Note: This dictation was  prepared with Dragon dictation along with smaller phrase technology. Any transcriptional errors that result from this process are unintentional.  Nicholes Mango M.D on 11/14/2016 at 3:02 PM  Between 7am to 6pm - Pager - [phone]  After 6pm go to [email or web address] - password [email or web address] [facility]  [facility]  Office  [phone]  CC: Primary care physician; Albina Billet, MD

## 2016-11-14 NOTE — Op Note (Signed)
[facility]   OPERATIVE NOTE    PRE-OPERATIVE DIAGNOSIS: DVT right lower extremity in association with profound anemia and GI bleed   POST-OPERATIVE DIAGNOSIS: Same  PROCEDURE: 1.   Ultrasound guidance for vascular access to the right common femoral vein 2.   Catheter placement into the inferior vena cava 3.   Inferior venacavogram 4.   Placement of a Celect IVC filter  SURGEON: Hortencia Pilar  ASSISTANT(S): None  ANESTHESIA: Conscious sedation was administered by the interventional radiology RN under my direct supervision. IV Versed plus fentanyl were utilized. Continuous ECG, pulse oximetry and blood pressure was monitored throughout the entire procedure. Conscious sedation was for a total of 20 minutes.  ESTIMATED BLOOD LOSS: minimal  FINDING(S): 1.  Patent IVC  SPECIMEN(S):  none  INDICATIONS:   Susan Barajas is a 71 y.o. y.o. female who presents with DVT in association with profound anemia and GI bleed.  Inferior vena cava filter is indicated for this reason.  Risks and benefits including filter thrombosis, migration, fracture, bleeding, and infection were all discussed.  We discussed that all IVC filters that we place can be removed if desired from the patient once the need for the filter has passed.    DESCRIPTION: After obtaining full informed written consent, the patient was brought back to the vascular suite. The skin was sterilely prepped and draped in a sterile surgical field was created. Ultrasound was placed in a sterile sleeve. The right common femoral vein was echolucent and compressible indicating patency. Image was recorded for the permanent record. The puncture was made under continuous real-time ultrasound guidance.  The right common femoral vein was accessed under direct ultrasound guidance without difficulty with a micropuncture needle. Microwire was then advanced under fluoroscopic guidance without difficulty. Micro-sheath was  then inserted and a J-wire was then placed. The dilator is passed over the wire and the delivery sheath was placed into the inferior vena cava.  Inferior venacavogram was performed. This demonstrated a patent IVC with the level of the renal veins at L2.  The filter was then deployed into the inferior vena cava at the level of L2 just below the renal veins. The delivery sheath was then removed. Pressure was held. Sterile dressings were placed. The patient tolerated the procedure well and was taken to the recovery room in stable condition.  Interpretation: IVC is opacified with bolus injection contrast is widely patent and measures 22 mm in diameter.  Celect filter is placed in good orientation without difficulty  COMPLICATIONS: None  CONDITION: Stable  Hortencia Pilar  11/14/2016, 7:03 PM

## 2016-11-14 NOTE — ED Notes (Signed)
Lab called to send phlebotomist to collect samples. States that will send someone ASAP.

## 2016-11-15 LAB — COMPREHENSIVE METABOLIC PANEL
ALT: 9 U/L — AB (ref 14–54)
ANION GAP: 8 (ref 5–15)
AST: 15 U/L (ref 15–41)
Albumin: 2.5 g/dL — ABNORMAL LOW (ref 3.5–5.0)
Alkaline Phosphatase: 44 U/L (ref 38–126)
BUN: 11 mg/dL (ref 6–20)
CHLORIDE: 110 mmol/L (ref 101–111)
CO2: 25 mmol/L (ref 22–32)
Calcium: 8.2 mg/dL — ABNORMAL LOW (ref 8.9–10.3)
Creatinine, Ser: 0.67 mg/dL (ref 0.44–1.00)
Glucose, Bld: 76 mg/dL (ref 65–99)
POTASSIUM: 3.3 mmol/L — AB (ref 3.5–5.1)
SODIUM: 143 mmol/L (ref 135–145)
Total Bilirubin: 0.7 mg/dL (ref 0.3–1.2)
Total Protein: 5.4 g/dL — ABNORMAL LOW (ref 6.5–8.1)

## 2016-11-15 LAB — CBC
HEMATOCRIT: 24.1 % — AB (ref 35.0–47.0)
HEMOGLOBIN: 7.5 g/dL — AB (ref 12.0–16.0)
MCH: 28 pg (ref 26.0–34.0)
MCHC: 31.2 g/dL — ABNORMAL LOW (ref 32.0–36.0)
MCV: 89.9 fL (ref 80.0–100.0)
PLATELETS: 282 10*3/uL (ref 150–440)
RBC: 2.68 MIL/uL — AB (ref 3.80–5.20)
RDW: 17.1 % — ABNORMAL HIGH (ref 11.5–14.5)
WBC: 7.2 10*3/uL (ref 3.6–11.0)

## 2016-11-15 LAB — HEMOGLOBIN AND HEMATOCRIT, BLOOD
HCT: 24.2 % — ABNORMAL LOW (ref 35.0–47.0)
HEMOGLOBIN: 7.5 g/dL — AB (ref 12.0–16.0)

## 2016-11-15 LAB — TSH: TSH: 2.08 u[IU]/mL (ref 0.350–4.500)

## 2016-11-15 LAB — GLUCOSE, CAPILLARY: Glucose-Capillary: 190 mg/dL — ABNORMAL HIGH (ref 65–99)

## 2016-11-15 MED ORDER — PANTOPRAZOLE SODIUM 40 MG IV SOLR
40.0000 mg | Freq: Two times a day (BID) | INTRAVENOUS | Status: AC
  Administered 2016-11-15 – 2016-11-17 (×6): 40 mg via INTRAVENOUS
  Filled 2016-11-15 (×7): qty 40

## 2016-11-15 MED ORDER — POTASSIUM CHLORIDE CRYS ER 20 MEQ PO TBCR
40.0000 meq | EXTENDED_RELEASE_TABLET | Freq: Once | ORAL | Status: AC
  Administered 2016-11-15: 40 meq via ORAL
  Filled 2016-11-15: qty 2

## 2016-11-15 MED ORDER — FERROUS GLUCONATE 324 (38 FE) MG PO TABS
324.0000 mg | ORAL_TABLET | Freq: Every day | ORAL | Status: DC
  Administered 2016-11-15 – 2016-11-18 (×4): 324 mg via ORAL
  Filled 2016-11-15 (×4): qty 1

## 2016-11-15 NOTE — Progress Notes (Signed)
[facility] at [place] NAME: Susan Barajas    MR#:  [id number]  DATE OF BIRTH:  Dec 11, 1945  SUBJECTIVE:  CHIEF COMPLAINT:  Patient had IVC filter placement yesterday  REVIEW OF SYSTEMS:  CONSTITUTIONAL: No fever, fatigue or weakness.  EYES: No blurred or double vision.  EARS, NOSE, AND THROAT: No tinnitus or ear pain.  RESPIRATORY: No cough, shortness of breath, wheezing or hemoptysis.  CARDIOVASCULAR: No chest pain, orthopnea, edema.  GASTROINTESTINAL: No nausea, vomiting, diarrhea or abdominal pain.  GENITOURINARY: No dysuria, hematuria.  ENDOCRINE: No polyuria, nocturia,  HEMATOLOGY: No anemia, easy bruising or bleeding SKIN: No rash or lesion. MUSCULOSKELETAL: No joint pain or arthritis.   NEUROLOGIC: No tingling, numbness, weakness.  PSYCHIATRY: No anxiety or depression.   DRUG ALLERGIES:  No Known Allergies  VITALS:  Blood pressure (!) 122/47, pulse 81, temperature 98.5 F (36.9 C), temperature source Oral, resp. rate 17, height _0  (1.676 m), weight 97 kg (213 lb 12.8 oz), SpO2 92 %.  PHYSICAL EXAMINATION:  GENERAL:  71 y.o.-year-old patient lying in the bed with no acute distress.  EYES: Pupils equal, round, reactive to light and accommodation. No scleral icterus. Extraocular muscles intact.  HEENT: Head atraumatic, normocephalic. Oropharynx and nasopharynx clear.  NECK:  Supple, no jugular venous distention. No thyroid enlargement, no tenderness.  LUNGS: Normal breath sounds bilaterally, no wheezing, rales,rhonchi or crepitation. No use of accessory muscles of respiration.  CARDIOVASCULAR: S1, S2 normal. No murmurs, rubs, or gallops.  ABDOMEN: Soft, nontender, nondistended. Bowel sounds present. No organomegaly or mass.  EXTREMITIES:Right lower extremity is tender and edematous. No  cyanosis, or clubbing.  NEUROLOGIC: Cranial nerves II through XII are intact. Muscle strength 5/5 in all extremities. Sensation intact.  Gait not checked.  PSYCHIATRIC: The patient is alert and oriented x 3.  SKIN: No obvious rash, lesion, or ulcer.    LABORATORY PANEL:   CBC  Recent Labs Lab 11/15/16 0409  WBC 7.2  HGB 7.5*  HCT 24.1*  PLT 282   ------------------------------------------------------------------------------------------------------------------  Chemistries   Recent Labs Lab 11/15/16 0409  NA 143  K 3.3*  CL 110  CO2 25  GLUCOSE 76  BUN 11  CREATININE 0.67  CALCIUM 8.2*  AST 15  ALT 9*  ALKPHOS 44  BILITOT 0.7   ------------------------------------------------------------------------------------------------------------------  Cardiac Enzymes No results for input(s): TROPONINI in the last 168 hours. ------------------------------------------------------------------------------------------------------------------  RADIOLOGY:  US Venous Img Lower Bilateral  Result Date: 11/14/2016 CLINICAL DATA:  71 year old female with right lower extremity swelling for the past 6 days EXAM: BILATERAL LOWER EXTREMITY VENOUS DOPPLER ULTRASOUND TECHNIQUE: Gray-scale sonography with graded compression, as well as color Doppler and duplex ultrasound were performed to evaluate the lower extremity deep venous systems from the level of the common femoral vein and including the common femoral, femoral, profunda femoral, popliteal and calf veins including the posterior tibial, peroneal and gastrocnemius veins when visible. The superficial great saphenous vein was also interrogated. Spectral Doppler was utilized to evaluate flow at rest and with distal augmentation maneuvers in the common femoral, femoral and popliteal veins. COMPARISON:  None. FINDINGS: RIGHT LOWER EXTREMITY Common Femoral Vein: No evidence of thrombus. Normal compressibility, respiratory phasicity and response to augmentation. Saphenofemoral Junction: No evidence of thrombus. Normal compressibility and flow on color Doppler imaging. Profunda Femoral  Vein: No evidence of thrombus. Normal compressibility and flow on color Doppler imaging. Femoral Vein: No evidence of thrombus. Normal compressibility, respiratory phasicity and response to augmentation.  Popliteal Vein: No evidence of thrombus. Normal compressibility, respiratory phasicity and response to augmentation. Calf Veins: The posterior tibial veins are noncompressible in the lumen serve filled with internal echogenic material consistent with acute thrombus. The peroneal veins are patent. Superficial Great Saphenous Vein: Noncompressible great saphenous vein with internal echogenic material in the lumen beginning in the mid thigh and extending into the proximal calf. The vessel is dilated. Venous Reflux:  None. Other Findings:  None. LEFT LOWER EXTREMITY Common Femoral Vein: No evidence of thrombus. Normal compressibility, respiratory phasicity and response to augmentation. Saphenofemoral Junction: No evidence of thrombus. Normal compressibility and flow on color Doppler imaging. Profunda Femoral Vein: No evidence of thrombus. Normal compressibility and flow on color Doppler imaging. Femoral Vein: No evidence of thrombus. Normal compressibility, respiratory phasicity and response to augmentation. Popliteal Vein: No evidence of thrombus. Normal compressibility, respiratory phasicity and response to augmentation. Calf Veins: No evidence of thrombus. Normal compressibility and flow on color Doppler imaging. Superficial Great Saphenous Vein: No evidence of thrombus. Normal compressibility and flow on color Doppler imaging. Venous Reflux:  None. Other Findings: Hypoechoic fluid collection in the popliteal fossa measures 5.4 x 1.0 x 4.1 cm consistent with a Baker cyst. IMPRESSION: 1. Positive for isolated calf deep venous thrombosis within the paired posterior tibial veins of the right calf. No extension into the popliteal vein. 2. Positive for superficial thrombosis of the right great saphenous vein beginning and  the mid thigh and extending into the mid calf. 3. No evidence of deep or superficial venous thrombosis in the left lower extremity. 4. Left Baker's cyst. Electronically Signed   By: Jacqulynn Cadet M.D.   On: 11/14/2016 10:23    EKG:   Orders placed or performed during the hospital encounter of 10/22/16  . ED EKG  . ED EKG  . EKG 12-Lead  . EKG 12-Lead    ASSESSMENT AND PLAN:    Susan Kalp  is a 71 y.o. female with a known history of Insulin requiring diabetes mellitus, hypertension, chronic iron deficiency anemia had EGD and colonoscopy done by Dr. Vira Agar one and half year ago which was normal is presenting to the ED with a chief complaint of right lower extremity pain and swelling. Venous Doppler was positive for right lower extremity DVT but her hemoglobin is at 6.2 and patient is reporting melena for the past several days. Her stool for Hemoccult is positive  #Symptomatic anemia with history of chronic iron deficiency anemia-Normocytic Received 2 units of blood transfusion Hemoglobin at 7.5  monitor hemoglobin and hematocrit GI consult is placed stool for Hemoccult is positive and patient reports melena Anemia workup-pending  #Right lower extremity  DVT  IVC filter placement done by vascular surgery on August 3 today by vascular surgery,  Patient with symptomatic anemia, melena and stool for Hemoccult is positive, hemoglobin is at 6.2 at the time of admission, not considering anticoagulation at this time  #Hypokalemia replete potassium  #Insulin requiring diabetes mellitus  Lantus 12 units for basal coverage. Patient takes 32 units at home Sliding scale insulin Holding metformin  #Melena with stool for Hemoccult positive Nothing by mouth, PPI, GI consult is placed Monitor hemoglobin and hematocrit and transfuse as needed    All the records are reviewed and case discussed with Care Management/Social Workerr. Management plans discussed with the patient, family  and they are in agreement.  CODE STATUS: fc   TOTAL TIME TAKING CARE OF THIS PATIENT: 36 minutes.   POSSIBLE D/C IN 2  DAYS, DEPENDING ON CLINICAL CONDITION.  Note: This dictation was prepared with Dragon dictation along with smaller phrase technology. Any transcriptional errors that result from this process are unintentional.   Nicholes Mango M.D on 11/15/2016 at 11:20 AM  Between 7am to 6pm - Pager - [phone] After 6pm go to [email or web address] - password [email or web address] [facility]  [facility]  Office  [phone]  CC: Primary care physician; Albina Billet, MD

## 2016-11-15 NOTE — Plan of Care (Signed)
Problem: Education: Goal: Knowledge of [facility] Education information/materials will improve Outcome: Progressing Patient states understanding.   Problem: Safety: Goal: Ability to remain free from injury will improve Outcome: Progressing Patient has been good about calling for help.   Comments: Patient is pleasant. Patient is now on clear liquids and tolerating. Waiting to see what hgb is in the morning.

## 2016-11-15 NOTE — Consult Note (Signed)
Referring Provider: Dr. Margaretmary Eddy Primary Care Physician:  Albina Billet, MD Primary Gastroenterologist:  Dr. Vira Agar  Reason for Consultation:  Anemia  HPI: Susan Barajas is a 71 y.o. female with multiple medical problems as stated below who has a history of iron deficiency anemia and has been on iron pills for years (reports black stools for years). She reports a normal EGD/colonoscopy in Dr. Vira Agar last year. Has been feeling weak all week and having LE swelling. Hgb 6.2 on admit yesterday and 7.5 today. Heme positive. Denies hematochezia. Denies dysphagia or heartburn. DVT noted on LE doppler. On a baby aspirin at home. Feels good.  Past Medical History:  Diagnosis Date  . Asthma 1950  . 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 Glaucoma 1949  . Hypertension   . Obesity, unspecified   . Personal history of tobacco use, presenting hazards to health   . Varicose veins of lower extremities with other complications     Past Surgical History:  Procedure Laterality Date  . BREAST BIOPSY Right 1991  . BREAST BIOPSY Left 2013  . COLONOSCOPY  2010   Dr. Vira Agar  . COLONOSCOPY  Jan 2016   Dr Vira Agar  . VEIN SURGERY Right 2006   Vein Closure Procedure; RF ablation of right GSV    Prior to Admission medications   Medication Sig Start Date End Date Taking? Authorizing Provider  aspirin 81 MG chewable tablet Chew 1 tablet (81 mg total) by mouth daily. 10/26/16  Yes Wieting, Richard, MD  ferrous gluconate (FERGON) 325 MG tablet Take 325 mg by mouth daily.   Yes [provider]  furosemide (LASIX) 20 MG tablet Take 1 tablet (20 mg total) by mouth daily. 10/26/16  Yes Wieting, Richard, MD  HUMALOG KWIKPEN 100 UNIT/ML KiwkPen Inject 10 Units into the skin 2 (two) times daily.  07/03/15  Yes [provider]  insulin glargine (LANTUS) 100 UNIT/ML injection Inject 32 Units into the skin daily.    Yes [provider]  latanoprost (XALATAN) 0.005 %  ophthalmic solution 1 drop at bedtime.   Yes [provider]  lovastatin (MEVACOR) 20 MG tablet Take 20 mg by mouth at bedtime.   Yes [provider]  metFORMIN (GLUCOPHAGE) 1000 MG tablet Take 1,000 mg by mouth daily.    Yes [provider]  Multiple Vitamin (MULTIVITAMIN) tablet Take 1 tablet by mouth daily.   Yes [provider]  pioglitazone (ACTOS) 30 MG tablet Take 15 mg by mouth daily.    Yes [provider]  sodium chloride 1 g tablet Take 1 tablet (1 g total) by mouth 2 (two) times daily with a meal. 10/25/16  Yes Wieting, Richard, MD  timolol (TIMOPTIC) 0.25 % ophthalmic solution Place 1 drop into both eyes 2 (two) times daily.  01/13/14  Yes [provider]    Scheduled Meds: . ferrous gluconate  324 mg Oral Daily  . furosemide  20 mg Oral Daily  . insulin glargine  12 Units Subcutaneous Daily  . latanoprost  1 drop Both Eyes QHS  . multivitamin with minerals  1 tablet Oral Daily  . [START ON 11/18/2016] pantoprazole  40 mg Intravenous Q12H  . potassium chloride  40 mEq Oral Once  . pravastatin  20 mg Oral q1800  . sodium chloride  1 g Oral BID WC  . timolol  1 drop Both Eyes BID   Continuous Infusions: . pantoprozole (PROTONIX) infusion 8 mg/hr (11/15/16 0459)  PRN Meds:.acetaminophen **OR** acetaminophen, polyethylene glycol  Allergies as of 11/14/2016  . (No Known Allergies)    Family History  Problem Relation Age of Onset  . Prostate cancer Father   . Other Sister        mouth cancer  . Hypertension Mother     Social History   Social History  . Marital status: Single    Spouse name: N/A  . Number of children: N/A  . Years of education: N/A   Occupational History  . Not on file.   Social History Main Topics  . Smoking status: Former Smoker    Packs/day: 1.00    Years: 20.00  . Smokeless tobacco: Never Used  . Alcohol use No  . Drug use: No  . Sexual activity: Not on file   Other Topics Concern   . Not on file   Social History Narrative   Independent at baseline. Lives at home with her brother    Review of Systems: All negative except as stated above in HPI.  Physical Exam: Vital signs: Vitals:   11/15/16 0346 11/15/16 0750  BP: (!) 119/51 (!) 122/47  Pulse: 85 81  Resp: 17   Temp: 98.5 F (36.9 C) 98.5 F (36.9 C)   Last BM Date: 11/13/16 General:  Lethargic, elderly, Well-developed, well-nourished, pleasant and cooperative in NAD HEENT: anicteric sclera, oropharynx clear Lungs:  Clear throughout to auscultation.   No wheezes, crackles, or rhonchi. No acute distress. Heart:  Regular rate and rhythm; no murmurs, clicks, rubs,  or gallops. Abdomen: soft, nontender, nondistended, +BS  Rectal:  Deferred Ext: trace pedal edema, no lower leg edema  GI:  Lab Results:  Recent Labs  11/14/16 0824 11/14/16 1237 11/14/16 1803 11/15/16 0028 11/15/16 0409  WBC 9.1 7.8  --   --  7.2  HGB 6.2* 6.1* 7.0* 7.5* 7.5*  HCT 21.5* 20.5* 22.9* 24.2* 24.1*  PLT 306 307  --   --  282   BMET  Recent Labs  11/14/16 0824 11/15/16 0409  NA 141 143  K 4.0 3.3*  CL 108 110  CO2 25 25  GLUCOSE 152* 76  BUN 16 11  CREATININE 0.75 0.67  CALCIUM 8.6* 8.2*   LFT  Recent Labs  11/15/16 0409  PROT 5.4*  ALBUMIN 2.5*  AST 15  ALT 9*  ALKPHOS 44  BILITOT 0.7   PT/INR No results for input(s): LABPROT, INR in the last 72 hours.   Studies/Results: US Venous Img Lower Bilateral  Result Date: 11/14/2016 CLINICAL DATA:  71 year old female with right lower extremity swelling for the past 6 days EXAM: BILATERAL LOWER EXTREMITY VENOUS DOPPLER ULTRASOUND TECHNIQUE: Gray-scale sonography with graded compression, as well as color Doppler and duplex ultrasound were performed to evaluate the lower extremity deep venous systems from the level of the common femoral vein and including the common femoral, femoral, profunda femoral, popliteal and calf veins including the posterior tibial,  peroneal and gastrocnemius veins when visible. The superficial great saphenous vein was also interrogated. Spectral Doppler was utilized to evaluate flow at rest and with distal augmentation maneuvers in the common femoral, femoral and popliteal veins. COMPARISON:  None. FINDINGS: RIGHT LOWER EXTREMITY Common Femoral Vein: No evidence of thrombus. Normal compressibility, respiratory phasicity and response to augmentation. Saphenofemoral Junction: No evidence of thrombus. Normal compressibility and flow on color Doppler imaging. Profunda Femoral Vein: No evidence of thrombus. Normal compressibility and flow on color Doppler imaging. Femoral Vein: No evidence of thrombus. Normal compressibility, respiratory  phasicity and response to augmentation. Popliteal Vein: No evidence of thrombus. Normal compressibility, respiratory phasicity and response to augmentation. Calf Veins: The posterior tibial veins are noncompressible in the lumen serve filled with internal echogenic material consistent with acute thrombus. The peroneal veins are patent. Superficial Great Saphenous Vein: Noncompressible great saphenous vein with internal echogenic material in the lumen beginning in the mid thigh and extending into the proximal calf. The vessel is dilated. Venous Reflux:  None. Other Findings:  None. LEFT LOWER EXTREMITY Common Femoral Vein: No evidence of thrombus. Normal compressibility, respiratory phasicity and response to augmentation. Saphenofemoral Junction: No evidence of thrombus. Normal compressibility and flow on color Doppler imaging. Profunda Femoral Vein: No evidence of thrombus. Normal compressibility and flow on color Doppler imaging. Femoral Vein: No evidence of thrombus. Normal compressibility, respiratory phasicity and response to augmentation. Popliteal Vein: No evidence of thrombus. Normal compressibility, respiratory phasicity and response to augmentation. Calf Veins: No evidence of thrombus. Normal  compressibility and flow on color Doppler imaging. Superficial Great Saphenous Vein: No evidence of thrombus. Normal compressibility and flow on color Doppler imaging. Venous Reflux:  None. Other Findings: Hypoechoic fluid collection in the popliteal fossa measures 5.4 x 1.0 x 4.1 cm consistent with a Baker cyst. IMPRESSION: 1. Positive for isolated calf deep venous thrombosis within the paired posterior tibial veins of the right calf. No extension into the popliteal vein. 2. Positive for superficial thrombosis of the right great saphenous vein beginning and the mid thigh and extending into the mid calf. 3. No evidence of deep or superficial venous thrombosis in the left lower extremity. 4. Left Baker's cyst. Electronically Signed   By: Jacqulynn Cadet M.D.   On: 11/14/2016 10:23    Impression/Plan: Symptomatic anemia in setting of chronic black stools on iron pills. I do not think she is having an acute ulcer bleed and will change to IV PPI Q 12 hours. Please obtain EGD/colon report on Monday from Dr. Percell [place] office. Will manage conservatively for now. Clear liquid diet. Follow H/Hs. Will hold off on a repeat GI procedures. Will follow.    LOS: 1 day   Benwood C.  11/15/2016, 11:35 AM

## 2016-11-16 LAB — TYPE AND SCREEN
ABO/RH(D): B POS
ANTIBODY SCREEN: NEGATIVE
UNIT DIVISION: 0
Unit division: 0
Unit division: 0

## 2016-11-16 LAB — BPAM RBC
BLOOD PRODUCT EXPIRATION DATE: 201808292359
BLOOD PRODUCT EXPIRATION DATE: 201809032359
Blood Product Expiration Date: 201808082359
ISSUE DATE / TIME: 201808031408
ISSUE DATE / TIME: 201808031822
ISSUE DATE / TIME: 201808041617
UNIT TYPE AND RH: 5100
Unit Type and Rh: 7300
Unit Type and Rh: 9500

## 2016-11-16 LAB — GLUCOSE, CAPILLARY
GLUCOSE-CAPILLARY: 217 mg/dL — AB (ref 65–99)
Glucose-Capillary: 154 mg/dL — ABNORMAL HIGH (ref 65–99)

## 2016-11-16 LAB — HEMOGLOBIN AND HEMATOCRIT, BLOOD
HCT: 25.5 % — ABNORMAL LOW (ref 35.0–47.0)
HEMOGLOBIN: 8 g/dL — AB (ref 12.0–16.0)

## 2016-11-16 MED ORDER — SODIUM CHLORIDE 0.9% FLUSH
3.0000 mL | Freq: Two times a day (BID) | INTRAVENOUS | Status: DC
  Administered 2016-11-17 – 2016-11-18 (×4): 3 mL via INTRAVENOUS

## 2016-11-16 MED ORDER — INSULIN GLARGINE 100 UNIT/ML ~~LOC~~ SOLN
20.0000 [IU] | Freq: Every day | SUBCUTANEOUS | Status: DC
  Administered 2016-11-17 – 2016-11-18 (×2): 20 [IU] via SUBCUTANEOUS
  Filled 2016-11-16 (×2): qty 0.2

## 2016-11-16 NOTE — Progress Notes (Signed)
Patient Susan Barajas glucose 217. No insulin orders at this time. MD Gouru made aware. MD to place orders. Will continue to monitor.   Iran Sizer M

## 2016-11-16 NOTE — Progress Notes (Signed)
Gastroenterology Progress Note    Susan Barajas 71 y.o. 16-Mar-1946   Subjective: Sitting in bedside chair. Denies abdominal pain. Feels good. Wants to eat.  Objective: Vital signs in last 24 hours: Vitals:   11/16/16 0421 11/16/16 0730  BP: (!) 112/53 (!) 113/52  Pulse: 90 84  Resp: 18 20  Temp: 98.7 F (37.1 C) 98.7 F (37.1 C)    Physical Exam: Gen: alert, no acute distress CV: RRR Chest: CTA B Abd: soft, nontender, nondistended, +BS  Lab Results:  Recent Labs  11/14/16 0824 11/15/16 0409  NA 141 143  K 4.0 3.3*  CL 108 110  CO2 25 25  GLUCOSE 152* 76  BUN 16 11  CREATININE 0.75 0.67  CALCIUM 8.6* 8.2*    Recent Labs  11/15/16 0409  AST 15  ALT 9*  ALKPHOS 44  BILITOT 0.7  PROT 5.4*  ALBUMIN 2.5*    Recent Labs  11/14/16 0824 11/14/16 1237  11/15/16 0409 11/16/16 0623  WBC 9.1 7.8  --  7.2  --   NEUTROABS 7.4*  --   --   --   --   HGB 6.2* 6.1*  < > 7.5* 8.0*  HCT 21.5* 20.5*  < > 24.1* 25.5*  MCV 91.9 91.0  --  89.9  --   PLT 306 307  --  282  --   < > = values in this interval not displayed. No results for input(s): LABPROT, INR in the last 72 hours.    Assessment/Plan: Anemia and heme positive - Hgb 8.0 (s/p 2 U PRBCs). No overt bleeding. Advance diet. F/U Dr. Vira Agar in 4-6 weeks. Will sign off. Call if questions.   Hoberg C. 11/16/2016, 11:00 AMPatient ID: Susan Anna Dejarnett, female   DOB: 1946-04-09, 71 y.o.   MRN: [id number]

## 2016-11-16 NOTE — Progress Notes (Signed)
[facility] [place] NAME: Susan Barajas    MR#:  [id number]  DATE OF BIRTH:  12/23/1945  SUBJECTIVE:  CHIEF COMPLAINT:  Patient Is resting comfortably tolerating liquid diet. Denies any abdominal pain. No other episodes of bleeding  REVIEW OF SYSTEMS:  CONSTITUTIONAL: No fever, fatigue or weakness.  EYES: No blurred or double vision.  EARS, NOSE, AND THROAT: No tinnitus or ear pain.  RESPIRATORY: No cough, shortness of breath, wheezing or hemoptysis.  CARDIOVASCULAR: No chest pain, orthopnea, edema.  GASTROINTESTINAL: No nausea, vomiting, diarrhea or abdominal pain.  GENITOURINARY: No dysuria, hematuria.  ENDOCRINE: No polyuria, nocturia,  HEMATOLOGY: No anemia, easy bruising or bleeding SKIN: No rash or lesion. MUSCULOSKELETAL: No joint pain or arthritis.   NEUROLOGIC: No tingling, numbness, weakness.  PSYCHIATRY: No anxiety or depression.   DRUG ALLERGIES:  No Known Allergies  VITALS:  Blood pressure (!) 113/52, pulse 84, temperature 98.7 F (37.1 C), temperature source Oral, resp. rate 20, height _0  (1.676 m), weight 97 kg (213 lb 12.8 oz), SpO2 91 %.  PHYSICAL EXAMINATION:  GENERAL:  71 y.o.-year-old patient lying in the bed with no acute distress.  EYES: Pupils equal, round, reactive to light and accommodation. No scleral icterus. Extraocular muscles intact.  HEENT: Head atraumatic, normocephalic. Oropharynx and nasopharynx clear.  NECK:  Supple, no jugular venous distention. No thyroid enlargement, no tenderness.  LUNGS: Normal breath sounds bilaterally, no wheezing, rales,rhonchi or crepitation. No use of accessory muscles of respiration.  CARDIOVASCULAR: S1, S2 normal. No murmurs, rubs, or gallops.  ABDOMEN: Soft, nontender, nondistended. Bowel sounds present. No organomegaly or mass.  EXTREMITIES:Right lower extremity is tender and edematous. No  cyanosis, or clubbing.  NEUROLOGIC: Cranial nerves II through XII  are intact. Muscle strength 5/5 in all extremities. Sensation intact. Gait not checked.  PSYCHIATRIC: The patient is alert and oriented x 3.  SKIN: No obvious rash, lesion, or ulcer.    LABORATORY PANEL:   CBC  Recent Labs Lab 11/15/16 0409 11/16/16 0623  WBC 7.2  --   HGB 7.5* 8.0*  HCT 24.1* 25.5*  PLT 282  --    ------------------------------------------------------------------------------------------------------------------  Chemistries   Recent Labs Lab 11/15/16 0409  NA 143  K 3.3*  CL 110  CO2 25  GLUCOSE 76  BUN 11  CREATININE 0.67  CALCIUM 8.2*  AST 15  ALT 9*  ALKPHOS 44  BILITOT 0.7   ------------------------------------------------------------------------------------------------------------------  Cardiac Enzymes No results for input(s): TROPONINI in the last 168 hours. ------------------------------------------------------------------------------------------------------------------  RADIOLOGY:  No results found.  EKG:   Orders placed or performed during the hospital encounter of 10/22/16  . ED EKG  . ED EKG  . EKG 12-Lead  . EKG 12-Lead    ASSESSMENT AND PLAN:    Susan Barajas  is a 71 y.o. female with a known history of Insulin requiring diabetes mellitus, hypertension, chronic iron deficiency anemia had EGD and colonoscopy done by Dr. Vira Agar one and half year ago which was normal is presenting to the ED with a chief complaint of right lower extremity pain and swelling. Venous Doppler was positive for right lower extremity DVT but her hemoglobin is at 6.2 and patient is reporting melena for the past several days. Her stool for Hemoccult is positive  #Symptomatic anemia with history of chronic iron deficiency anemia-Normocytic Received 2 units of blood transfusion Hemoglobin at 7.5--8.0  monitor hemoglobin and hematocrit GI consult is placed stool for Hemoccult is positive and  patient reports melena, which is resolved now Outpatient  follow-up with Dr. Vira Agar in 4 weeks, GI signed off Advance diet as tolerated   #Right lower extremity  DVT  IVC filter placement done by vascular surgery on August 3  by vascular surgery,  Patient with symptomatic anemia, melena and stool for Hemoccult is positive, hemoglobin is at 6.2 at the time of admission, not considering anticoagulation at this time  #Hypokalemia replete potassium PRN  #Insulin requiring diabetes mellitus  Lantus 20 units for basal coverage. Patient takes 32 units at home Sliding scale insulin Holding metformin   PT for deconditioning   All the records are reviewed and case discussed with Care Management/Social Workerr. Management plans discussed with the patient, family and they are in agreement.  CODE STATUS: fc   TOTAL TIME TAKING CARE OF THIS PATIENT: 36 minutes.   POSSIBLE D/C IN 2 DAYS, DEPENDING ON CLINICAL CONDITION.  Note: This dictation was prepared with Dragon dictation along with smaller phrase technology. Any transcriptional errors that result from this process are unintentional.   Nicholes Mango M.D on 11/16/2016 at 12:03 PM  Between 7am to 6pm - Pager - [phone] After 6pm go to [email or web address] - password [email or web address] [facility]  [facility]  Office  [phone]  CC: Primary care physician; Albina Billet, MD

## 2016-11-17 ENCOUNTER — Inpatient Hospital Stay: Payer: Medicare Other

## 2016-11-17 ENCOUNTER — Encounter: Payer: Self-pay | Admitting: Vascular Surgery

## 2016-11-17 DIAGNOSIS — D649 Anemia, unspecified: Secondary | ICD-10-CM

## 2016-11-17 DIAGNOSIS — I82401 Acute embolism and thrombosis of unspecified deep veins of right lower extremity: Secondary | ICD-10-CM

## 2016-11-17 LAB — POTASSIUM: Potassium: 3.8 mmol/L (ref 3.5–5.1)

## 2016-11-17 LAB — CBC
HCT: 25.6 % — ABNORMAL LOW (ref 35.0–47.0)
Hemoglobin: 7.9 g/dL — ABNORMAL LOW (ref 12.0–16.0)
MCH: 27.1 pg (ref 26.0–34.0)
MCHC: 30.8 g/dL — ABNORMAL LOW (ref 32.0–36.0)
MCV: 88 fL (ref 80.0–100.0)
Platelets: 232 10*3/uL (ref 150–440)
RBC: 2.91 MIL/uL — ABNORMAL LOW (ref 3.80–5.20)
RDW: 16.1 % — ABNORMAL HIGH (ref 11.5–14.5)
WBC: 7.1 10*3/uL (ref 3.6–11.0)

## 2016-11-17 MED ORDER — POLYETHYLENE GLYCOL 3350 17 G PO PACK
17.0000 g | PACK | Freq: Every day | ORAL | Status: DC
  Filled 2016-11-17: qty 1

## 2016-11-17 MED ORDER — ACETAMINOPHEN 325 MG PO TABS: 650.0000 mg | ORAL_TABLET | Freq: Four times a day (QID) | ORAL | Status: AC | PRN

## 2016-11-17 MED ORDER — OMEPRAZOLE 40 MG PO CPDR: 40.0000 mg | DELAYED_RELEASE_CAPSULE | Freq: Every day | ORAL | 0 refills | Status: DC

## 2016-11-17 MED ORDER — IOPAMIDOL (ISOVUE-370) INJECTION 76%
75.0000 mL | Freq: Once | INTRAVENOUS | Status: AC | PRN
  Administered 2016-11-17: 75 mL via INTRAVENOUS

## 2016-11-17 MED ORDER — PANTOPRAZOLE SODIUM 40 MG PO TBEC
40.0000 mg | DELAYED_RELEASE_TABLET | Freq: Every day | ORAL | Status: DC
  Administered 2016-11-18: 40 mg via ORAL
  Filled 2016-11-17: qty 1

## 2016-11-17 NOTE — Care Management (Signed)
No discharge needs identified by patient or members of the care team

## 2016-11-17 NOTE — Consult Note (Signed)
Name: Susan Barajas MRN: [id number] DOB: [birth year]     CONSULTATION DATE: 11/14/2016  REFERRING MD :  Margaretmary Eddy  CHIEF COMPLAINT: RT lower ext swelling  STUDIES:  CT chest 8.6.18 I have Independently reviewed images of  CT chest   on 11/18/2016 Interpretation:No PE,+ pulm edema   [name] lower EXT RT +sided DVT   HISTORY OF PRESENT ILLNESS:   71 y.o. female with a known history of Insulin requiring diabetes mellitus, hypertension, chronic iron deficiency anemia had EGD and colonoscopy done by Dr. Vira Agar one and half year ago which was normal is presenting to the ED with a chief complaint of right lower extremity pain and swelling. Venous Doppler was positive for right lower extremity DVT but her hemoglobin is at 6.2 and patient is reporting melena for the past several days. Her stool for Hemoccult is positive. 2 units of blood transition was ordered by the ED physician and hospitalist team is called to admit the patient. Patient denies any chest pain or shortness of breath. No other complaints denies any abdominal pain  Prior to discharge patient was noted to have exertional hypoxia and discharge was held I was consulted to evaluate patient's hypoxia Patient has no symptoms no shortness of breath no chest pain and when would've working with physical therapy her sats dropped into the upper 70s  Due to her anemia of unknown origin patient did not receive anticoagulation her hemoglobin upon presentation was 6.1 and patient was not started on anticoagulation and ended up getting an IVC filter  CT chest reviewed no active PE at this time however does have some possible pulmonary edema     PAST MEDICAL HISTORY :   has a past medical history of Asthma (1950); Diabetes ([place]) (Age 56); Glaucoma (1949); Hypertension; Obesity, unspecified; Personal history of tobacco use, presenting hazards to health; and Varicose veins of lower extremities with other complications.  has a past surgical history  that includes Vein Surgery (Right, 2006); Colonoscopy (2010); Breast biopsy (Right, 1991); Breast biopsy (Left, 2013); Colonoscopy (Jan 2016); and IVC Filter Insertion (N/A, 11/14/2016). Prior to Admission medications   Medication Sig Start Date End Date Taking? Authorizing Provider  aspirin 81 MG chewable tablet Chew 1 tablet (81 mg total) by mouth daily. 10/26/16  Yes Wieting, Richard, MD  ferrous gluconate (FERGON) 325 MG tablet Take 325 mg by mouth daily.   Yes [provider]  furosemide (LASIX) 20 MG tablet Take 1 tablet (20 mg total) by mouth daily. 10/26/16  Yes Wieting, Richard, MD  HUMALOG KWIKPEN 100 UNIT/ML KiwkPen Inject 10 Units into the skin 2 (two) times daily.  07/03/15  Yes [provider]  insulin glargine (LANTUS) 100 UNIT/ML injection Inject 32 Units into the skin daily.    Yes [provider]  latanoprost (XALATAN) 0.005 % ophthalmic solution 1 drop at bedtime.   Yes [provider]  lovastatin (MEVACOR) 20 MG tablet Take 20 mg by mouth at bedtime.   Yes [provider]  metFORMIN (GLUCOPHAGE) 1000 MG tablet Take 1,000 mg by mouth daily.    Yes [provider]  Multiple Vitamin (MULTIVITAMIN) tablet Take 1 tablet by mouth daily.   Yes [provider]  pioglitazone (ACTOS) 30 MG tablet Take 15 mg by mouth daily.    Yes [provider]  sodium chloride 1 g tablet Take 1 tablet (1 g total) by mouth 2 (two) times daily with a meal. 10/25/16  Yes Leslye Peer, Richard, MD  timolol (TIMOPTIC) 0.25 %  ophthalmic solution Place 1 drop into both eyes 2 (two) times daily.  01/13/14  Yes [provider]  acetaminophen (TYLENOL) 325 MG tablet Take 2 tablets (650 mg total) by mouth every 6 (six) hours as needed for mild pain (or Fever >/= 101). 11/17/16   Gouru, Aruna, MD  omeprazole (PRILOSEC) 40 MG capsule Take 1 capsule (40 mg total) by mouth daily before breakfast. 11/17/16   Nicholes Mango, MD   No Known Allergies  FAMILY  HISTORY:  family history includes Hypertension in her mother; Other in her sister; Prostate cancer in her father. SOCIAL HISTORY:  reports that she has quit smoking. She has a 20.00 pack-year smoking history. She has never used smokeless tobacco. She reports that she does not drink alcohol or use drugs.  REVIEW OF SYSTEMS:   Constitutional: Negative for fever, chills, weight loss, malaise/fatigue and diaphoresis.  HENT: Negative for hearing loss, ear pain, nosebleeds, congestion, sore throat, neck pain, tinnitus and ear discharge.   Eyes: Negative for blurred vision, double vision, photophobia, pain, discharge and redness.  Respiratory: Negative for cough, hemoptysis, sputum production, shortness of breath, wheezing and stridor.   Cardiovascular: Negative for chest pain, palpitations, orthopnea, claudication, +leg swelling and PND.  Gastrointestinal: Negative for heartburn, nausea, vomiting, abdominal pain, diarrhea, constipation, blood in stool and melena.  Genitourinary: Negative for dysuria, urgency, frequency, hematuria and flank pain.  Musculoskeletal: Negative for myalgias, back pain, joint pain and falls.  Skin: Negative for itching and rash.  Neurological: Negative for dizziness, tingling, tremors, sensory change, speech change, focal weakness, seizures, loss of consciousness, weakness and headaches.  Endo/Heme/Allergies: Negative for environmental allergies and polydipsia. Does not bruise/bleed easily.  ALL OTHER ROS ARE NEGATIVE    VITAL SIGNS: Temp:  [98.2 F (36.8 C)-98.7 F (37.1 C)] 98.2 F (36.8 C) (08/06 0746) Pulse Rate:  [43-94] 86 (08/06 0746) Resp:  [16-18] 16 (08/06 0746) BP: (104-117)/(44-61) 115/61 (08/06 0746) SpO2:  [90 %-93 %] 91 % (08/06 0746)  Physical Examination:   GENERAL:NAD, no fevers, chills, no weakness no fatigue HEAD: Normocephalic, atraumatic.  EYES: Pupils equal, round, reactive to light. Extraocular muscles intact. No scleral icterus.    MOUTH: Moist mucosal membrane.  Large tongue EAR, NOSE, THROAT: Clear without exudates. No external lesions.  NECK: Supple. No thyromegaly. No nodules. No JVD.  PULMONARY:CTA B/L no wheezes, no crackles, no rhonchi CARDIOVASCULAR: S1 and S2. Regular rate and rhythm. No murmurs, rubs, or gallops. No edema.  GASTROINTESTINAL: Soft, nontender, nondistended. No masses. Positive bowel sounds.  MUSCULOSKELETAL: No swelling, clubbing, or edema. Range of motion full in all extremities.  NEUROLOGIC: Cranial nerves II through XII are intact. No gross focal neurological deficits.  SKIN: No ulceration, lesions, rashes, or cyanosis. Skin warm and dry. Turgor intact.  PSYCHIATRIC: Mood, affect within normal limits. The patient is awake, alert and oriented x 3. Insight, judgment intact.       Recent Labs Lab 11/14/16 0824 11/15/16 0409  NA 141 143  K 4.0 3.3*  CL 108 110  CO2 25 25  BUN 16 11  CREATININE 0.75 0.67  GLUCOSE 152* 76    Recent Labs Lab 11/14/16 1237  11/15/16 0409 11/16/16 0623 11/17/16 0332  HGB 6.1*  < > 7.5* 8.0* 7.9*  HCT 20.5*  < > 24.1* 25.5* 25.6*  WBC 7.8  --  7.2  --  7.1  PLT 307  --  282  --  232  < > = values in this interval not displayed. No  results found.  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 with a history of bilateral lower extremity swelling right greater than left now with acute right leg DVT without PE with probable underlying sleep apnea  #1 recommend echocardiogram to assess LV function and diastolic dysfunction #2 recommend outpatient sleep study to assess for sleep apnea #3 patient is unable to get anticoagulation due to underlying anemia of unknown origin probable GI bleed patient has IVC filter placed #4 patient may need oxygen with exertion and can be  discharged if needed on oxygen  Follow-up with outpatient [facility] for sleep study    Patient/Family are satisfied with Plan of action and management. All questions answered sign off at this time  Corrin Parker, M.D.  [facility]  Medical Director [place] Director [facility] Cardio-Pulmonary Department

## 2016-11-17 NOTE — Care Management Important Message (Signed)
Important Message  Patient Details  Name: Susan Barajas MRN: [id number] Date of Birth: 1945-11-14   Medicare Important Message Given:  Yes Signed IM notice given    Katrina Stack, RN 11/17/2016, 4:04 PM

## 2016-11-17 NOTE — Progress Notes (Signed)
SATURATION QUALIFICATIONS: (This note is used to comply with regulatory documentation for home oxygen)  Patient Saturations on Room Air at Rest = 100%  Patient Saturations on Room Air while Ambulating = 80-86%  Patient Saturations on 2 Liters of oxygen while Ambulating = 91%  Please briefly explain why patient needs home oxygen: Pt desaturates to 80% while ambulating.

## 2016-11-17 NOTE — Evaluation (Signed)
Physical Therapy Evaluation and Discharge Patient Details Name: Susan Barajas MRN: [id number] DOB: 05/02/1945 Today's Date: 11/17/2016   History of Present Illness  Pt is a 71 y/o F who presented with RLE pain and swelling.  Venous Doppler was positive for right lower extremity DVT but her hemoglobin was 6.2 with melena over the past several days.  Pt is now s/p IVC filter placement.  Pt's PMH includes glaucoma.    Clinical Impression  Pt admitted with above diagnosis. 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  Supervision provided when ambulating with very mild instability noted (this was the pt's first time ambulating since admission), otherwise pt independent with mobility.  Noted that pt was SOB toward end of ambulation so SpO2 was checked and was reading 79% on RA and takes several minutes to rise to 92% on RA once seated.  Questioned pt if she has noticed this in her past and she confirms that her O2 and she reports SOB when ambulating for the past several months.  She reports her MD is aware but has not suggested O2 use.  MD and RN made aware.  No skilled PT needs identified, PT will sign off.      Follow Up Recommendations No PT follow up    Equipment Recommendations  None recommended by PT    Recommendations for Other Services       Precautions / Restrictions Precautions Precautions: Fall;Other (comment) Precaution Comments: Monitor O2 Restrictions Weight Bearing Restrictions: No      Mobility  Bed Mobility Overal bed mobility: Independent Bed Mobility: Supine to Sit     Supine to sit: Independent     General bed mobility comments: No physical assist or cues needed. Pt performs independently.    Transfers Overall transfer level: Independent Equipment used: None Transfers: Sit to/from Stand Sit to Stand: Independent         General transfer comment: No physical assist or cues needed. Pt performs  independently with no signs of instability.  Ambulation/Gait Ambulation/Gait assistance: Supervision Ambulation Distance (Feet): 200 Feet Assistive device: None   Gait velocity: mildly decreased Gait velocity interpretation: Below normal speed for age/gender General Gait Details: Pt with very mild unsteadiness when turning corners but otherwise stable.  Noted that pt was SOB toward end of ambulation so SpO2 was checked and was reading 79% on RA.  (See general notes)  Stairs            Wheelchair Mobility    Modified Rankin (Stroke Patients Only)       Balance Overall balance assessment: Needs assistance Sitting-balance support: No upper extremity supported;Feet supported Sitting balance-Leahy Scale: Good     Standing balance support: No upper extremity supported;During functional activity Standing balance-Leahy Scale: Good               High level balance activites: Head turns;Sudden stops;Turns;Backward walking High Level Balance Comments: Mild instability when turning corners but otherwise no instability with higher level balance activities             Pertinent Vitals/Pain Pain Assessment: No/denies pain    Home Living Family/patient expects to be discharged to:: Private residence Living Arrangements: Other relatives (brother) Available Help at Discharge: Family;Available PRN/intermittently Type of Home: House Home Access: Stairs to enter Entrance Stairs-Rails: Left;Right;Can reach both Entrance Stairs-Number of Steps: 5 Home Layout: One level Home Equipment: None      Prior Function Level of Independence: Independent  Comments: Pt ambulates without AD.  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  Her job does not have any significantly physical demands.  She mostly does cleaning and cooking.  She denies any falls over the past 6 months.     Hand Dominance        Extremity/Trunk Assessment   Upper Extremity  Assessment Upper Extremity Assessment: Overall WFL for tasks assessed    Lower Extremity Assessment Lower Extremity Assessment: Overall WFL for tasks assessed       Communication   Communication: No difficulties  Cognition Arousal/Alertness: Awake/alert Behavior During Therapy: WFL for tasks assessed/performed Overall Cognitive Status: Within Functional Limits for tasks assessed                                        General Comments General comments (skin integrity, edema, etc.): SpO2 dropped to 79% on RA when ambulating and takes several minutes to rise to 92% on RA once seated.  Questioned pt if she has noticed this in her past and she confirms that her O2 and she reports SOB when ambulating for the past several months.  She reports her MD is aware but has not suggested O2 use.  MD and RN made aware.    Exercises     Assessment/Plan    PT Assessment Patent does not need any further PT services  PT Problem List Cardiopulmonary status limiting activity       PT Treatment Interventions Gait training;Patient/family education    PT Goals (Current goals can be found in the Care Plan section)  Acute Rehab PT Goals Patient Stated Goal: to go home  PT Goal Formulation: All assessment and education complete, DC therapy    Frequency     Barriers to discharge        Co-evaluation               AM-PAC PT "6 Clicks" Daily Activity  Outcome Measure Difficulty turning over in bed (including adjusting bedclothes, sheets and blankets)?: None Difficulty moving from lying on back to sitting on the side of the bed? : None Difficulty sitting down on and standing up from a chair with arms (e.g., wheelchair, bedside commode, etc,.)?: None Help needed moving to and from a bed to chair (including a wheelchair)?: A Little Help needed walking in hospital room?: A Little Help needed climbing 3-5 steps with a railing? : A Little 6 Click Score: 21    End of Session  Equipment Utilized During Treatment: Gait belt Activity Tolerance: Patient tolerated treatment well Patient left: in chair;with call bell/phone within reach;with chair alarm set Nurse Communication: Mobility status;Other (comment) (SpO2) PT Visit Diagnosis: Unsteadiness on feet (R26.81)    Time: 5573-2202 PT Time Calculation (min) (ACUTE ONLY): 16 min   Charges:   PT Evaluation $PT Eval Low Complexity: 1 Low     PT G Codes:        Collie Siad PT, DPT 11/17/2016, 12:42 PM

## 2016-11-17 NOTE — Discharge Summary (Signed)
[place] [facility] [place] NAME: Susan Barajas    MR#:  [id number]  DATE OF BIRTH:  Sep 28, 1945  DATE OF ADMISSION:  11/14/2016 ADMITTING PHYSICIAN: Nicholes Mango, MD  DATE OF DISCHARGE: 11/17/16 PRIMARY CARE PHYSICIAN: Albina Billet, MD    ADMISSION DIAGNOSIS:  Upper GI bleed [K92.2] Acute deep vein thrombosis (DVT) of distal vein of right lower extremity (HCC) [I82.4Z1]  DISCHARGE DIAGNOSIS:  Active Problems:   Symptomatic anemia   acute RLE DVT  Acute hypoxic respiratory failure requiring 2 L of oxygen via nasal cannula SECONDARY DIAGNOSIS:   Past Medical History:  Diagnosis Date  . Asthma 1950  . 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 Glaucoma 1949  . Hypertension   . Obesity, unspecified   . Personal history of tobacco use, presenting hazards to health   . Varicose veins of lower extremities with other complications     HOSPITAL COURSE:  HPI  Susan Dabney  is a 71 y.o. female with a known history of Insulin requiring diabetes mellitus, hypertension, chronic iron deficiency anemia had EGD and colonoscopy done by Dr. Vira Agar 2 years ago which was normal is presenting to the ED with a chief complaint of right lower extremity pain and swelling. Venous Doppler was positive for right lower extremity DVT but her hemoglobin is at 6.2 and patient is reporting melena for the past several days. Her stool for Hemoccult is positive. 2 units of blood transition was ordered by the ED physician and hospitalist team is called to admit the patient. Patient denies any chest pain or shortness of breath. No other complaints denies any abdominal pain  #Symptomatic anemia with history of chronic iron deficiency anemia-Normocytic Received 2 units of blood transfusion Hemoglobin at 7.5--8.0  monitored hemoglobin and hematocrit GI consult is placed stool for Hemoccult is positive and patient reports melena, which is resolved  now Outpatient follow-up with Dr. Vira Agar in [place] had outpatient colonoscopy in January 2016 , normal results according to the patient could not find the report in Epic  GI signed off.  Advanced diet, patient tolerated well Resume home dose aspirin, as there is no active bleeding, PCP to monitor hemoglobin during follow-up visit  #Right lower extremity DVT  IVCfilter placement done by vascular surgery on August 3  by vascular surgery,  Patient with symptomatic anemia,melena and stool for Hemoccult is positive,hemoglobin is at 6.2 at the time of admission #Hypokalemia replete potassium PRN  #Insulin requiring diabetes mellitus   resume Lantus 32 units at home Sliding scale insulin provided during the hospital course Resume home med metformin  #Acute hypoxic respiratory failure Requires 2 L of oxygen via nasal cannula at discharge PT for deconditioning -no PT needs identified      DISCHARGE CONDITIONS:   STABLE  CONSULTS OBTAINED:  Treatment Team:  Katha Cabal, MD Wilford Corner, MD   PROCEDURES ivc filter   DRUG ALLERGIES:  No Known Allergies  DISCHARGE MEDICATIONS:   Current Discharge Medication List    START taking these medications   Details  acetaminophen (TYLENOL) 325 MG tablet Take 2 tablets (650 mg total) by mouth every 6 (six) hours as needed for mild pain (or Fever >/= 101).    omeprazole (PRILOSEC) 40 MG capsule Take 1 capsule (40 mg total) by mouth daily before breakfast. Qty: 30 capsule, Refills: 0      CONTINUE these medications which have NOT CHANGED   Details  aspirin  81 MG chewable tablet Chew 1 tablet (81 mg total) by mouth daily. Qty: 30 tablet, Refills: 0    ferrous gluconate (FERGON) 325 MG tablet Take 325 mg by mouth daily.    furosemide (LASIX) 20 MG tablet Take 1 tablet (20 mg total) by mouth daily. Qty: 30 tablet, Refills: 0    HUMALOG KWIKPEN 100 UNIT/ML KiwkPen Inject 10 Units into the skin 2 (two)  times daily.     insulin glargine (LANTUS) 100 UNIT/ML injection Inject 32 Units into the skin daily.     latanoprost (XALATAN) 0.005 % ophthalmic solution 1 drop at bedtime.    lovastatin (MEVACOR) 20 MG tablet Take 20 mg by mouth at bedtime.    metFORMIN (GLUCOPHAGE) 1000 MG tablet Take 1,000 mg by mouth daily.     Multiple Vitamin (MULTIVITAMIN) tablet Take 1 tablet by mouth daily.    pioglitazone (ACTOS) 30 MG tablet Take 15 mg by mouth daily.     sodium chloride 1 g tablet Take 1 tablet (1 g total) by mouth 2 (two) times daily with a meal. Qty: 60 tablet, Refills: 0    timolol (TIMOPTIC) 0.25 % ophthalmic solution Place 1 drop into both eyes 2 (two) times daily.          DISCHARGE INSTRUCTIONS:  Follow-up with primary care physician in one week Follow-up with oncology Dr. Kristopher Glee for DVT workup Follow-up with gastroenterology Dr. Vira Agar in 5 days     DIET:  Cardiac diet and Diabetic diet  DISCHARGE CONDITION:  Stable  ACTIVITY:  Activity as tolerated  OXYGEN:  Home Oxygen: yes   Oxygen Delivery: 2 L of oxygen via nasal cannula  DISCHARGE LOCATION:  home   If you experience worsening of your admission symptoms, develop shortness of breath, life threatening emergency, suicidal or homicidal thoughts you must seek medical attention immediately by calling 911 or calling your MD immediately  if symptoms less severe.  You Must read complete instructions/literature along with all the possible adverse reactions/side effects for all the Medicines you take and that have been prescribed to you. Take any new Medicines after you have completely understood and accpet all the possible adverse reactions/side effects.   Please note  You were cared for by a hospitalist during your hospital stay. If you have any questions about your discharge medications or the care you received while you were in the hospital after you are discharged, you can call the unit and asked to speak  with the hospitalist on call if the hospitalist that took care of you is not available. Once you are discharged, your primary care physician will handle any further medical issues. Please note that NO REFILLS for any discharge medications will be authorized once you are discharged, as it is imperative that you return to your primary care physician (or establish a relationship with a primary care physician if you do not have one) for your aftercare needs so that they can reassess your need for medications and monitor your lab values.     Today  Chief Complaint  Patient presents with  . Leg Swelling   Patient is resting comfortably denies any complaints. No other episodes of melena  ROS:  CONSTITUTIONAL: Denies fevers, chills. Denies any fatigue, weakness.  EYES: Denies blurry vision, double vision, eye pain. EARS, NOSE, THROAT: Denies tinnitus, ear pain, hearing loss. RESPIRATORY: Denies cough, wheeze, shortness of breath.  CARDIOVASCULAR: Denies chest pain, palpitations, edema.  GASTROINTESTINAL: Denies nausea, vomiting, diarrhea, abdominal pain. Denies bright red blood  per rectum. GENITOURINARY: Denies dysuria, hematuria. ENDOCRINE: Denies nocturia or thyroid problems. HEMATOLOGIC AND LYMPHATIC: Denies easy bruising or bleeding. SKIN: Denies rash or lesion. MUSCULOSKELETAL: Denies pain in neck, back, shoulder, knees, hips or arthritic symptoms.  NEUROLOGIC: Denies paralysis, paresthesias.  PSYCHIATRIC: Denies anxiety or depressive symptoms.   VITAL SIGNS:  Blood pressure 115/61, pulse 86, temperature 98.2 F (36.8 C), temperature source Oral, resp. rate 16, height _0  (1.676 m), weight 97 kg (213 lb 12.8 oz), SpO2 91 %.  I/O:    Intake/Output Summary (Last 24 hours) at 11/17/16 1230 Last data filed at 11/17/16 0919  Gross per 24 hour  Intake              600 ml  Output              300 ml  Net              300 ml    PHYSICAL EXAMINATION:  GENERAL:  71 y.o.-year-old  patient lying in the bed with no acute distress.  EYES: Pupils equal, round, reactive to light and accommodation. No scleral icterus. Extraocular muscles intact.  HEENT: Head atraumatic, normocephalic. Oropharynx and nasopharynx clear.  NECK:  Supple, no jugular venous distention. No thyroid enlargement, no tenderness.  LUNGS: Normal breath sounds bilaterally, no wheezing, rales,rhonchi or crepitation. No use of accessory muscles of respiration.  CARDIOVASCULAR: S1, S2 normal. No murmurs, rubs, or gallops.  ABDOMEN: Soft, non-tender, non-distended. Bowel sounds present. No organomegaly or mass.  EXTREMITIES: No pedal edema, cyanosis, or clubbing.  NEUROLOGIC: Cranial nerves II through XII are intact. Muscle strength 5/5 in all extremities. Sensation intact. Gait not checked.  PSYCHIATRIC: The patient is alert and oriented x 3.  SKIN: No obvious rash, lesion, or ulcer.   DATA REVIEW:   CBC  Recent Labs Lab 11/17/16 0332  WBC 7.1  HGB 7.9*  HCT 25.6*  PLT 232    Chemistries   Recent Labs Lab 11/15/16 0409  NA 143  K 3.3*  CL 110  CO2 25  GLUCOSE 76  BUN 11  CREATININE 0.67  CALCIUM 8.2*  AST 15  ALT 9*  ALKPHOS 44  BILITOT 0.7    Cardiac Enzymes No results for input(s): TROPONINI in the last 168 hours.  Microbiology Results  No results found for this or any previous visit.  RADIOLOGY:  US Venous Img Lower Bilateral  Result Date: 11/14/2016 CLINICAL DATA:  71 year old female with right lower extremity swelling for the past 6 days EXAM: BILATERAL LOWER EXTREMITY VENOUS DOPPLER ULTRASOUND TECHNIQUE: Gray-scale sonography with graded compression, as well as color Doppler and duplex ultrasound were performed to evaluate the lower extremity deep venous systems from the level of the common femoral vein and including the common femoral, femoral, profunda femoral, popliteal and calf veins including the posterior tibial, peroneal and gastrocnemius veins when visible. The  superficial great saphenous vein was also interrogated. Spectral Doppler was utilized to evaluate flow at rest and with distal augmentation maneuvers in the common femoral, femoral and popliteal veins. COMPARISON:  None. FINDINGS: RIGHT LOWER EXTREMITY Common Femoral Vein: No evidence of thrombus. Normal compressibility, respiratory phasicity and response to augmentation. Saphenofemoral Junction: No evidence of thrombus. Normal compressibility and flow on color Doppler imaging. Profunda Femoral Vein: No evidence of thrombus. Normal compressibility and flow on color Doppler imaging. Femoral Vein: No evidence of thrombus. Normal compressibility, respiratory phasicity and response to augmentation. Popliteal Vein: No evidence of thrombus. Normal compressibility, respiratory phasicity and response  to augmentation. Calf Veins: The posterior tibial veins are noncompressible in the lumen serve filled with internal echogenic material consistent with acute thrombus. The peroneal veins are patent. Superficial Great Saphenous Vein: Noncompressible great saphenous vein with internal echogenic material in the lumen beginning in the mid thigh and extending into the proximal calf. The vessel is dilated. Venous Reflux:  None. Other Findings:  None. LEFT LOWER EXTREMITY Common Femoral Vein: No evidence of thrombus. Normal compressibility, respiratory phasicity and response to augmentation. Saphenofemoral Junction: No evidence of thrombus. Normal compressibility and flow on color Doppler imaging. Profunda Femoral Vein: No evidence of thrombus. Normal compressibility and flow on color Doppler imaging. Femoral Vein: No evidence of thrombus. Normal compressibility, respiratory phasicity and response to augmentation. Popliteal Vein: No evidence of thrombus. Normal compressibility, respiratory phasicity and response to augmentation. Calf Veins: No evidence of thrombus. Normal compressibility and flow on color Doppler imaging. Superficial  Great Saphenous Vein: No evidence of thrombus. Normal compressibility and flow on color Doppler imaging. Venous Reflux:  None. Other Findings: Hypoechoic fluid collection in the popliteal fossa measures 5.4 x 1.0 x 4.1 cm consistent with a Baker cyst. IMPRESSION: 1. Positive for isolated calf deep venous thrombosis within the paired posterior tibial veins of the right calf. No extension into the popliteal vein. 2. Positive for superficial thrombosis of the right great saphenous vein beginning and the mid thigh and extending into the mid calf. 3. No evidence of deep or superficial venous thrombosis in the left lower extremity. 4. Left Baker's cyst. Electronically Signed   By: Jacqulynn Cadet M.D.   On: 11/14/2016 10:23    EKG:   Orders placed or performed during the hospital encounter of 10/22/16  . ED EKG  . ED EKG  . EKG 12-Lead  . EKG 12-Lead      Management plans discussed with the patient, family and they are in agreement.  CODE STATUS:     Code Status Orders        Start     Ordered   11/14/16 1707  Full code  Continuous     11/14/16 1706    Code Status History    Date Active Date Inactive Code Status Order ID Comments User Context   10/22/2016  2:33 PM 10/26/2016  4:50 PM Full Code [id number]  Gladstone Lighter, MD Inpatient      TOTAL TIME TAKING CARE OF THIS PATIENT: 45  minutes.   Note: This dictation was prepared with Dragon dictation along with smaller phrase technology. Any transcriptional errors that result from this process are unintentional.   _0 @  on 11/17/2016 at 12:30 PM  Between 7am to 6pm - Pager - [phone]  After 6pm go to [email or web address] - password [email or web address] [facility]  [facility]  Office  [phone]  CC: Primary care physician; Albina Billet, MD

## 2016-11-17 NOTE — Care Management (Signed)
Informed by primary nurse that patient's exertional room air oxygen sats were as low as 79-86.  Patient denies having chronic cardiac or pulmonary conditions.  Says that she has felt short of breath since her [facility] admission/ discharge 10/2016. Discussed that unless there was a qualifying diagnosis, oxygen would not be covered by insurance regardless of the levels.  Had anticipated discharge today.  Spoke with attending and discussed possible need of further work up of low oxygen sats.

## 2016-11-17 NOTE — Progress Notes (Addendum)
[facility] at [place] NAME: Susan Barajas    MR#:  [id number]  DATE OF BIRTH:  31-Mar-1946  SUBJECTIVE:  CHIEF COMPLAINT:  Patient Is resting comfortably tolerating soft diet. Denies any abdominal pain. No other episodes of bleeding Patient is desaturating to 80% on room air with examination  REVIEW OF SYSTEMS:  CONSTITUTIONAL: No fever, fatigue or weakness.  EYES: No blurred or double vision.  EARS, NOSE, AND THROAT: No tinnitus or ear pain.  RESPIRATORY: No cough, shortness of breath, wheezing or hemoptysis.  CARDIOVASCULAR: No chest pain, orthopnea, edema.  GASTROINTESTINAL: No nausea, vomiting, diarrhea or abdominal pain.  GENITOURINARY: No dysuria, hematuria.  ENDOCRINE: No polyuria, nocturia,  HEMATOLOGY: No anemia, easy bruising or bleeding SKIN: No rash or lesion. MUSCULOSKELETAL: No joint pain or arthritis.   NEUROLOGIC: No tingling, numbness, weakness.  PSYCHIATRY: No anxiety or depression.   DRUG ALLERGIES:  No Known Allergies  VITALS:  Blood pressure 115/61, pulse 86, temperature 98.2 F (36.8 C), temperature source Oral, resp. rate 16, height _0  (1.676 m), weight 97 kg (213 lb 12.8 oz), SpO2 91 %.  PHYSICAL EXAMINATION:  GENERAL:  71 y.o.-year-old patient lying in the bed with no acute distress.  EYES: Pupils equal, round, reactive to light and accommodation. No scleral icterus. Extraocular muscles intact.  HEENT: Head atraumatic, normocephalic. Oropharynx and nasopharynx clear.  NECK:  Supple, no jugular venous distention. No thyroid enlargement, no tenderness.  LUNGS: Normal breath sounds bilaterally, no wheezing, rales,rhonchi or crepitation. No use of accessory muscles of respiration.  CARDIOVASCULAR: S1, S2 normal. No murmurs, rubs, or gallops.  ABDOMEN: Soft, nontender, nondistended. Bowel sounds present. No organomegaly or mass.  EXTREMITIES:Right lower extremity is tender and edematous. No  cyanosis, or  clubbing.  NEUROLOGIC: Cranial nerves II through XII are intact. Muscle strength 5/5 in all extremities. Sensation intact. Gait not checked.  PSYCHIATRIC: The patient is alert and oriented x 3.  SKIN: No obvious rash, lesion, or ulcer.    LABORATORY PANEL:   CBC  Recent Labs Lab 11/17/16 0332  WBC 7.1  HGB 7.9*  HCT 25.6*  PLT 232   ------------------------------------------------------------------------------------------------------------------  Chemistries   Recent Labs Lab 11/15/16 0409  NA 143  K 3.3*  CL 110  CO2 25  GLUCOSE 76  BUN 11  CREATININE 0.67  CALCIUM 8.2*  AST 15  ALT 9*  ALKPHOS 44  BILITOT 0.7   ------------------------------------------------------------------------------------------------------------------  Cardiac Enzymes No results for input(s): TROPONINI in the last 168 hours. ------------------------------------------------------------------------------------------------------------------  RADIOLOGY:  No results found.  EKG:   Orders placed or performed during the hospital encounter of 10/22/16  . ED EKG  . ED EKG  . EKG 12-Lead  . EKG 12-Lead    ASSESSMENT AND PLAN:    Susan Barajas  is a 71 y.o. female with a known history of Insulin requiring diabetes mellitus, hypertension, chronic iron deficiency anemia had EGD and colonoscopy done by Dr. Vira Agar one and half year ago which was normal is presenting to the ED with a chief complaint of right lower extremity pain and swelling. Venous Doppler was positive for right lower extremity DVT but her hemoglobin is at 6.2 and patient is reporting melena for the past several days. Her stool for Hemoccult is positive  HPI  Susan Barajas a 71 y.o.femalewith a known history of Insulin requiring diabetes mellitus, hypertension, chronic iron deficiency anemia had EGD and colonoscopy done by Dr. Vira Agar 2 years ago which was normal is  presenting to the ED with a chief complaint of right  lower extremity pain and swelling. Venous Doppler was positive for right lower extremity DVT but her hemoglobin is at 6.2 and patient is reporting melena for the past several days. Her stool for Hemoccult is positive. 2 units of blood transition was ordered by the ED physician and hospitalist team is called to admit the patient.Patient denies any chest pain or shortness of breath. No other complaints denies any abdominal pain  #Acute hypoxic respiratory failure CT angiogram of the chest to rule out pulmonary embolism Pulmonology consult Overnight pulse oximetry study Requires 2 L of oxygen via nasal cannula PT for deconditioning -no PT needs identified    #Symptomatic anemia with history of chronic iron deficiency anemia-Normocytic Received 2 units of blood transfusion Hemoglobin at 7.5--8.0 monitored hemoglobin and hematocrit GI consult is placed stool for Hemoccult is positive and patient reports melena,which is resolved now Outpatient follow-up with Dr. Vira Agar in [place] had outpatient colonoscopy in January 2016 , normal results according to the patient could not find the report in Epic GI signed off.  Advanced diet, patient tolerated well Resume home dose aspirin, as there is no active bleeding, PCP to monitor hemoglobin during follow-up visit  #Right lower extremity DVT IVCfilter placement done by vascular surgery on August 3 by vascular surgery,  Patient with symptomatic anemia,melena and stool for Hemoccult is positive,hemoglobin is at 6.2 at the time of admission #Hypokalemia replete potassium PRN  #Insulin requiring diabetes mellitus  resume Lantus 32 units at home Sliding scale insulin provided during the hospital course Resume home med metformin       All the records are reviewed and case discussed with Care Management/Social Workerr. Management plans discussed with the patient, family and they are in agreement.  CODE STATUS: fc    TOTAL TIME TAKING CARE OF THIS PATIENT: 36 minutes.   POSSIBLE D/C IN 2 DAYS, DEPENDING ON CLINICAL CONDITION.  Note: This dictation was prepared with Dragon dictation along with smaller phrase technology. Any transcriptional errors that result from this process are unintentional.   Nicholes Mango M.D on 11/17/2016 at 1:56 PM  Between 7am to 6pm - Pager - [phone] After 6pm go to [email or web address] - password [email or web address] [facility]  [place] [facility]  Office  [phone]  CC: Primary care physician; Albina Billet, MD

## 2016-11-17 NOTE — Care Management Note (Signed)
Case Management Note  Patient Details  Name: Susan Barajas MRN: [id number] Date of Birth: 04-28-1945  Subjective/Objective:                 Diagnosed with DVT and had IVC filter placed 8/4.  Hgb 6.2 with melena and requiring transfusion.  GI has signed off. No anticoagulation at this time.  PT consult pending.      Action/Plan:   Expected Discharge Date:                  Expected Discharge Plan:     In-House Referral:     Discharge planning Services     Post Acute Care Choice:    Choice offered to:     DME Arranged:    DME Agency:     HH Arranged:    HH Agency:     Status of Service:     If discussed at [employer] of Stay Meetings, dates discussed:    Additional Comments:  Katrina Stack, RN 11/17/2016, 9:24 AM

## 2016-11-18 NOTE — Progress Notes (Addendum)
SATURATION QUALIFICATIONS: (This note is used to comply with regulatory documentation for home oxygen)  Patient Saturations on Room Air at Rest = 96%  Patient Saturations on Room Air while Ambulating = 77%  Patient Saturations on 2 Liters of oxygen while Ambulating = 93%  Please briefly explain why patient needs home oxygen: Dr. Margaretmary Eddy made aware of readings

## 2016-11-18 NOTE — Discharge Instructions (Signed)
Follow-up with primary care physician in one week Follow-up with oncology Dr. Kristopher Glee for DVT workup Follow-up with gastroenterology Dr. Vira Agar in 5 days Follow-up with cardiology Dr. Rockey Situ in 1 week

## 2016-11-18 NOTE — Discharge Summary (Signed)
[facility] at [place] NAME: Susan Barajas    MR#:  [id number]  DATE OF BIRTH:  [birth year]  DATE OF ADMISSION:  11/14/2016 ADMITTING PHYSICIAN: Nicholes Mango, MD  DATE OF DISCHARGE: 11/18/16 PRIMARY CARE PHYSICIAN: Albina Billet, MD    ADMISSION DIAGNOSIS:  Upper GI bleed [K92.2] Acute deep vein thrombosis (DVT) of distal vein of right lower extremity (HCC) [I82.4Z1]  DISCHARGE DIAGNOSIS:  Active Problems:   Symptomatic anemia   acute RLE DVT  Acute on hypoxic respiratory failure secondary to diastolic dysfunction- requiring 2 L of oxygen via nasal cannula SECONDARY DIAGNOSIS:   Past Medical History:  Diagnosis Date  . Asthma 1950  . Diabetes ([place]) Age 80   Type 2; insulin dependent  . Glaucoma 1949  . Hypertension   . Obesity, unspecified   . Personal history of tobacco use, presenting hazards to health   . Varicose veins of lower extremities with other complications     HOSPITAL COURSE:  HPI  Susan Schroepfer  is a 71 y.o. female with a known history of Insulin requiring diabetes mellitus, hypertension, chronic iron deficiency anemia had EGD and colonoscopy done by Dr. Vira Agar 2 years ago which was normal is presenting to the ED with a chief complaint of right lower extremity pain and swelling. Venous Doppler was positive for right lower extremity DVT but her hemoglobin is at 6.2 and patient is reporting melena for the past several days. Her stool for Hemoccult is positive. 2 units of blood transition was ordered by the ED physician and hospitalist team is called to admit the patient. Patient denies any chest pain or shortness of breath. No other complaints denies any abdominal pain  #Acute on hypoxic respiratory failure  Sec to diastolic dysfuction Echocardiogram July 2018 has revealed ejection fraction 65-70%, no regional wall motion abnormalities.PA pressure 65 mm; CT angiogram of the chest with pulmonary edema with  congestion Overnight pulse oximetry with several episodes of desaturation; pulmonology is recommending outpatient sleep study, outpatient cardiology follow-up Requires 2 L of oxygen via nasal cannula with ambulation as patient's is desaturating to 78-79% with ambulation PT for deconditioning -no PT needs identified    #Symptomatic anemia with history of chronic iron deficiency anemia-Normocytic Received 2 units of blood transfusion Hemoglobin at 7.5--8.0  monitored hemoglobin and hematocrit GI consult is placed stool for Hemoccult is positive and patient reports melena, which is resolved now Outpatient follow-up with Dr. Vira Agar in [place] had outpatient colonoscopy in January 2016 , normal results according to the patient could not find the report in Epic  GI signed off.  Advanced diet, patient tolerated well Resume home dose aspirin, as there is no active bleeding, PCP to monitor hemoglobin during follow-up visit  #Right lower extremity DVT  IVCfilter placement done by vascular surgery on August 3  by vascular surgery,  Patient with symptomatic anemia,melena and stool for Hemoccult is positive,hemoglobin is at 6.2 at the time of admission #Hypokalemia replete potassium PRN  #Insulin requiring diabetes mellitus   resume Lantus 32 units at home Sliding scale insulin provided during the hospital course Resume home med metformin      DISCHARGE CONDITIONS:   STABLE  CONSULTS OBTAINED:  Treatment Team:  Delana Meyer Dolores Lory, MD Wilford Corner, MD   PROCEDURES ivc filter   DRUG ALLERGIES:  No Known Allergies  DISCHARGE MEDICATIONS:   Current Discharge Medication List    START taking these medications   Details  acetaminophen (TYLENOL) 325  MG tablet Take 2 tablets (650 mg total) by mouth every 6 (six) hours as needed for mild pain (or Fever >/= 101).    omeprazole (PRILOSEC) 40 MG capsule Take 1 capsule (40 mg total) by mouth daily before  breakfast. Qty: 30 capsule, Refills: 0      CONTINUE these medications which have NOT CHANGED   Details  aspirin 81 MG chewable tablet Chew 1 tablet (81 mg total) by mouth daily. Qty: 30 tablet, Refills: 0    ferrous gluconate (FERGON) 325 MG tablet Take 325 mg by mouth daily.    furosemide (LASIX) 20 MG tablet Take 1 tablet (20 mg total) by mouth daily. Qty: 30 tablet, Refills: 0    HUMALOG KWIKPEN 100 UNIT/ML KiwkPen Inject 10 Units into the skin 2 (two) times daily.     insulin glargine (LANTUS) 100 UNIT/ML injection Inject 32 Units into the skin daily.     latanoprost (XALATAN) 0.005 % ophthalmic solution 1 drop at bedtime.    lovastatin (MEVACOR) 20 MG tablet Take 20 mg by mouth at bedtime.    metFORMIN (GLUCOPHAGE) 1000 MG tablet Take 1,000 mg by mouth daily.     Multiple Vitamin (MULTIVITAMIN) tablet Take 1 tablet by mouth daily.    pioglitazone (ACTOS) 30 MG tablet Take 15 mg by mouth daily.     sodium chloride 1 g tablet Take 1 tablet (1 g total) by mouth 2 (two) times daily with a meal. Qty: 60 tablet, Refills: 0    timolol (TIMOPTIC) 0.25 % ophthalmic solution Place 1 drop into both eyes 2 (two) times daily.          DISCHARGE INSTRUCTIONS:  Follow-up with primary care physician in one week Follow-up with oncology Dr. Kristopher Glee for DVT workup Follow-up with gastroenterology Dr. Vira Agar in 5 days Follow-up with cardiology Dr. Rockey Situ in 1 week    DIET:  Cardiac diet and Diabetic diet  DISCHARGE CONDITION:  Stable  ACTIVITY:  Activity as tolerated,Home health  OXYGEN:  Home Oxygen: yes   Oxygen Delivery: 2 L of oxygen via nasal cannula  DISCHARGE LOCATION:  home   If you experience worsening of your admission symptoms, develop shortness of breath, life threatening emergency, suicidal or homicidal thoughts you must seek medical attention immediately by calling 911 or calling your MD immediately  if symptoms less severe.  You Must read complete  instructions/literature along with all the possible adverse reactions/side effects for all the Medicines you take and that have been prescribed to you. Take any new Medicines after you have completely understood and accpet all the possible adverse reactions/side effects.   Please note  You were cared for by a hospitalist during your hospital stay. If you have any questions about your discharge medications or the care you received while you were in the hospital after you are discharged, you can call the unit and asked to speak with the hospitalist on call if the hospitalist that took care of you is not available. Once you are discharged, your primary care physician will handle any further medical issues. Please note that NO REFILLS for any discharge medications will be authorized once you are discharged, as it is imperative that you return to your primary care physician (or establish a relationship with a primary care physician if you do not have one) for your aftercare needs so that they can reassess your need for medications and monitor your lab values.     Today  Chief Complaint  Patient presents with  .  Leg Swelling   Patient is resting comfortably denies any complaints. No other episodes of melena Denies any shortness of breath while resting but desaturating with ambulation and pulse ox is dropping down to 78-79% on room air  ROS:  CONSTITUTIONAL: Denies fevers, chills. Denies any fatigue, weakness.  EYES: Denies blurry vision, double vision, eye pain. EARS, NOSE, THROAT: Denies tinnitus, ear pain, hearing loss. RESPIRATORY: Denies cough, wheeze, shortness of breath.  CARDIOVASCULAR: Denies chest pain, palpitations, edema.  GASTROINTESTINAL: Denies nausea, vomiting, diarrhea, abdominal pain. Denies bright red blood per rectum. GENITOURINARY: Denies dysuria, hematuria. ENDOCRINE: Denies nocturia or thyroid problems. HEMATOLOGIC AND LYMPHATIC: Denies easy bruising or bleeding. SKIN:  Denies rash or lesion. MUSCULOSKELETAL: Denies pain in neck, back, shoulder, knees, hips or arthritic symptoms.  NEUROLOGIC: Denies paralysis, paresthesias.  PSYCHIATRIC: Denies anxiety or depressive symptoms.   VITAL SIGNS:  Blood pressure 121/61, pulse 92, temperature 98.9 F (37.2 C), temperature source Oral, resp. rate 18, height _0  (1.676 m), weight 97 kg (213 lb 12.8 oz), SpO2 90 %.  I/O:    Intake/Output Summary (Last 24 hours) at 11/18/16 1028 Last data filed at 11/18/16 0928  Gross per 24 hour  Intake              960 ml  Output             1200 ml  Net             -240 ml    PHYSICAL EXAMINATION:  GENERAL:  71 y.o.-year-old patient lying in the bed with no acute distress.  EYES: Pupils equal, round, reactive to light and accommodation. No scleral icterus. Extraocular muscles intact.  HEENT: Head atraumatic, normocephalic. Oropharynx and nasopharynx clear.  NECK:  Supple, no jugular venous distention. No thyroid enlargement, no tenderness.  LUNGS: Normal breath sounds bilaterally, no wheezing, rales,rhonchi or crepitation. No use of accessory muscles of respiration.  CARDIOVASCULAR: S1, S2 normal. No murmurs, rubs, or gallops.  ABDOMEN: Soft, non-tender, non-distended. Bowel sounds present. No organomegaly or mass.  EXTREMITIES: No pedal edema, cyanosis, or clubbing.  NEUROLOGIC: Cranial nerves II through XII are intact. Muscle strength 5/5 in all extremities. Sensation intact. Gait not checked.  PSYCHIATRIC: The patient is alert and oriented x 3.  SKIN: No obvious rash, lesion, or ulcer.   DATA REVIEW:   CBC  Recent Labs Lab 11/17/16 0332  WBC 7.1  HGB 7.9*  HCT 25.6*  PLT 232    Chemistries   Recent Labs Lab 11/15/16 0409 11/17/16 1707  NA 143  --   K 3.3* 3.8  CL 110  --   CO2 25  --   GLUCOSE 76  --   BUN 11  --   CREATININE 0.67  --   CALCIUM 8.2*  --   AST 15  --   ALT 9*  --   ALKPHOS 44  --   BILITOT 0.7  --     Cardiac  Enzymes No results for input(s): TROPONINI in the last 168 hours.  Microbiology Results  No results found for this or any previous visit.  RADIOLOGY:  Ct Angio Chest Pe W Or Wo Contrast  Result Date: 11/17/2016 CLINICAL DATA:  71 year old female with right lower extremity DVT and anemia with melanotic stools. EXAM: CT ANGIOGRAPHY CHEST WITH CONTRAST TECHNIQUE: Multidetector CT imaging of the chest was performed using the standard protocol during bolus administration of intravenous contrast. Multiplanar CT image reconstructions and MIPs were obtained to evaluate the vascular anatomy.  CONTRAST:  75 mL Isovue 370 COMPARISON:  Chest x-ray 02/03/2008 FINDINGS: Cardiovascular: Excellent opacification of the main pulmonary artery to the proximal segmental level. The subsegmental branches are blurred secondary to respiratory motion artifact. The main pulmonary artery is enlarged at 3.6 cm. Calcifications are present along the course of the coronary arteries. Borderline cardiomegaly with right heart enlargement suggesting elevated right heart pressures. Normal caliber thoracic aorta. No aneurysm or dissection. Atherosclerotic calcifications are present in the aorta. Mediastinum/Nodes: Unremarkable CT appearance of the thyroid gland. High right paratracheal lymph node measures 1.6 cm in short axis (image 24 series 4). AP window lymph node measures 0.9 cm in short axis. Mildly prominent right hilar nodal tissue measures up to 1.6 cm No soft tissue mediastinal mass. The thoracic esophagus is unremarkable. Lungs/Pleura: Lungs are clear. No pleural effusion or pneumothorax. Upper Abdomen: No acute abnormality. Musculoskeletal: No chest wall abnormality. No acute or significant osseous findings. Mild multilevel degenerative endplate changes. Review of the MIP images confirms the above findings. IMPRESSION: 1. Negative for acute pulmonary embolus, pneumonia or other acute cardiopulmonary process. 2. Enlarged pulmonary  artery at 3.6 cm. Similar findings can be seen in the setting of pulmonary arterial hypertension. 3. Cardiomegaly with right heart enlargement concerning for elevated right heart pressures and cor pulmonale. 4. Mildly enlarged high right paratracheal lymph node. In isolation, this finding is nonspecific and may reflect reactive adenopathy, chronic adenopathy related to intermittent CHF or lymphatic congestion, and potentially malignancy including lymphoma and metastatic disease. Recommend repeat CT scan of the chest in 3-6 months. 5. Coronary artery and thoracic aortic atherosclerotic calcifications. Aortic Atherosclerosis (ICD10-I70.0). Electronically Signed   By: Jacqulynn Cadet M.D.   On: 11/17/2016 16:14    EKG:   Orders placed or performed during the hospital encounter of 10/22/16  . ED EKG  . ED EKG  . EKG 12-Lead  . EKG 12-Lead      Management plans discussed with the patient, family and they are in agreement.  CODE STATUS:     Code Status Orders        Start     Ordered   11/14/16 1707  Full code  Continuous     11/14/16 1706    Code Status History    Date Active Date Inactive Code Status Order ID Comments User Context   10/22/2016  2:33 PM 10/26/2016  4:50 PM Full Code [id number]  Gladstone Lighter, MD Inpatient      TOTAL TIME TAKING CARE OF THIS PATIENT: 45  minutes.   Note: This dictation was prepared with Dragon dictation along with smaller phrase technology. Any transcriptional errors that result from this process are unintentional.   _0 @  on 11/18/2016 at 10:28 AM  Between 7am to 6pm - Pager - [phone]  After 6pm go to [email or web address] - password [email or web address] [facility]  [place] [facility]  Office  [phone]  CC: Primary care physician; Albina Billet, MD

## 2016-11-18 NOTE — Progress Notes (Signed)
Discharge instructions explained to pt/ verbalized an understanding/ iv and tele removed/ home 02 delivered to room / transported off unit via wheelchair.

## 2016-11-18 NOTE — Plan of Care (Signed)
Problem: Pain Managment: Goal: General experience of comfort will improve Outcome: Progressing No voiced complaints of pain  Problem: Physical Regulation: Goal: Ability to maintain clinical measurements within normal limits will improve Outcome: Progressing Overnight pulse oximetry done this shift.

## 2016-11-20 ENCOUNTER — Telehealth: Payer: Self-pay

## 2016-11-20 NOTE — Telephone Encounter (Signed)
-----  Message from [facility], [place] sent at 08/19/8467  2:46 PM EDT ----- Regarding: FW: Hospital follow up in 3-6 weeks with any provider Please schedule hospital f/u with any provider in 3-6 weeks for hypoxia.  Thanks, Misty ----- Message ----- From: Flora Lipps, MD Sent: 11/18/2016   8:06 AM To: Renelda Mom, LPN Subject: Hospital follow up in 3-6 weeks with any pro#

## 2016-11-20 NOTE — Telephone Encounter (Signed)
No ans no vm

## 2016-11-25 ENCOUNTER — Encounter: Payer: Self-pay | Admitting: Internal Medicine

## 2016-11-25 ENCOUNTER — Inpatient Hospital Stay
Admission: AD | Admit: 2016-11-25 | Discharge: 2016-11-28 | DRG: 812 | Disposition: A | Discharge status: Home Health | Payer: Medicare Other | Source: Ambulatory Visit | Attending: Internal Medicine | Admitting: Internal Medicine

## 2016-11-25 DIAGNOSIS — E669 Obesity, unspecified: Secondary | ICD-10-CM | POA: Diagnosis present

## 2016-11-25 DIAGNOSIS — Z6836 Body mass index (BMI) 36.0-36.9, adult: Secondary | ICD-10-CM | POA: Diagnosis not present

## 2016-11-25 DIAGNOSIS — K219 Gastro-esophageal reflux disease without esophagitis: Secondary | ICD-10-CM | POA: Diagnosis present

## 2016-11-25 DIAGNOSIS — D649 Anemia, unspecified: Secondary | ICD-10-CM | POA: Diagnosis not present

## 2016-11-25 DIAGNOSIS — K2970 Gastritis, unspecified, without bleeding: Secondary | ICD-10-CM | POA: Diagnosis present

## 2016-11-25 DIAGNOSIS — Z808 Family history of malignant neoplasm of other organs or systems: Secondary | ICD-10-CM

## 2016-11-25 DIAGNOSIS — Z794 Long term (current) use of insulin: Secondary | ICD-10-CM

## 2016-11-25 DIAGNOSIS — Z79899 Other long term (current) drug therapy: Secondary | ICD-10-CM

## 2016-11-25 DIAGNOSIS — Z87891 Personal history of nicotine dependence: Secondary | ICD-10-CM

## 2016-11-25 DIAGNOSIS — J45998 Other asthma: Secondary | ICD-10-CM | POA: Diagnosis present

## 2016-11-25 DIAGNOSIS — I10 Essential (primary) hypertension: Secondary | ICD-10-CM | POA: Diagnosis present

## 2016-11-25 DIAGNOSIS — B9681 Helicobacter pylori [H. pylori] as the cause of diseases classified elsewhere: Secondary | ICD-10-CM | POA: Diagnosis present

## 2016-11-25 DIAGNOSIS — Z8042 Family history of malignant neoplasm of prostate: Secondary | ICD-10-CM | POA: Diagnosis not present

## 2016-11-25 DIAGNOSIS — Z86718 Personal history of other venous thrombosis and embolism: Secondary | ICD-10-CM

## 2016-11-25 DIAGNOSIS — D509 Iron deficiency anemia, unspecified: Secondary | ICD-10-CM | POA: Diagnosis present

## 2016-11-25 DIAGNOSIS — Z95828 Presence of other vascular implants and grafts: Secondary | ICD-10-CM | POA: Diagnosis not present

## 2016-11-25 DIAGNOSIS — K3189 Other diseases of stomach and duodenum: Secondary | ICD-10-CM | POA: Diagnosis present

## 2016-11-25 DIAGNOSIS — D500 Iron deficiency anemia secondary to blood loss (chronic): Secondary | ICD-10-CM | POA: Diagnosis not present

## 2016-11-25 DIAGNOSIS — E119 Type 2 diabetes mellitus without complications: Secondary | ICD-10-CM | POA: Diagnosis present

## 2016-11-25 DIAGNOSIS — K921 Melena: Secondary | ICD-10-CM | POA: Diagnosis present

## 2016-11-25 DIAGNOSIS — D62 Acute posthemorrhagic anemia: Principal | ICD-10-CM | POA: Diagnosis present

## 2016-11-25 DIAGNOSIS — E876 Hypokalemia: Secondary | ICD-10-CM | POA: Diagnosis present

## 2016-11-25 DIAGNOSIS — Z7982 Long term (current) use of aspirin: Secondary | ICD-10-CM

## 2016-11-25 DIAGNOSIS — H409 Unspecified glaucoma: Secondary | ICD-10-CM | POA: Diagnosis present

## 2016-11-25 DIAGNOSIS — R531 Weakness: Secondary | ICD-10-CM | POA: Diagnosis present

## 2016-11-25 DIAGNOSIS — I83893 Varicose veins of bilateral lower extremities with other complications: Secondary | ICD-10-CM | POA: Diagnosis present

## 2016-11-25 DIAGNOSIS — Z8249 Family history of ischemic heart disease and other diseases of the circulatory system: Secondary | ICD-10-CM

## 2016-11-25 DIAGNOSIS — Z8711 Personal history of peptic ulcer disease: Secondary | ICD-10-CM | POA: Diagnosis not present

## 2016-11-25 DIAGNOSIS — K922 Gastrointestinal hemorrhage, unspecified: Secondary | ICD-10-CM | POA: Diagnosis not present

## 2016-11-25 LAB — IRON AND TIBC
Iron: 55 ug/dL (ref 28–170)
SATURATION RATIOS: 16 % (ref 10.4–31.8)
TIBC: 351 ug/dL (ref 250–450)
UIBC: 296 ug/dL

## 2016-11-25 LAB — FERRITIN: Ferritin: 16 ng/mL (ref 11–307)

## 2016-11-25 LAB — GLUCOSE, CAPILLARY: GLUCOSE-CAPILLARY: 139 mg/dL — AB (ref 65–99)

## 2016-11-25 LAB — PREPARE RBC (CROSSMATCH)

## 2016-11-25 LAB — HEMOGLOBIN AND HEMATOCRIT, BLOOD
HCT: 26.5 % — ABNORMAL LOW (ref 35.0–47.0)
Hemoglobin: 8.2 g/dL — ABNORMAL LOW (ref 12.0–16.0)

## 2016-11-25 MED ORDER — LATANOPROST 0.005 % OP SOLN
1.0000 [drp] | Freq: Every day | OPHTHALMIC | Status: DC
  Administered 2016-11-25 – 2016-11-27 (×3): 1 [drp] via OPHTHALMIC
  Filled 2016-11-25: qty 2.5

## 2016-11-25 MED ORDER — TIMOLOL MALEATE 0.25 % OP SOLN
1.0000 [drp] | Freq: Two times a day (BID) | OPHTHALMIC | Status: DC
  Administered 2016-11-25 – 2016-11-28 (×6): 1 [drp] via OPHTHALMIC
  Filled 2016-11-25: qty 5

## 2016-11-25 MED ORDER — SODIUM CHLORIDE 0.9% FLUSH: 3.0000 mL | INTRAVENOUS | Status: DC | PRN

## 2016-11-25 MED ORDER — SODIUM CHLORIDE 0.9 % IV SOLN
Freq: Once | INTRAVENOUS | Status: AC
  Administered 2016-11-25: 15:00:00 via INTRAVENOUS

## 2016-11-25 MED ORDER — INSULIN ASPART 100 UNIT/ML ~~LOC~~ SOLN: 0.0000 [IU] | Freq: Every day | SUBCUTANEOUS | Status: DC

## 2016-11-25 MED ORDER — FERROUS GLUCONATE 324 (38 FE) MG PO TABS
325.0000 mg | ORAL_TABLET | Freq: Every day | ORAL | Status: DC
  Filled 2016-11-25: qty 1

## 2016-11-25 MED ORDER — INSULIN ASPART 100 UNIT/ML ~~LOC~~ SOLN
0.0000 [IU] | Freq: Three times a day (TID) | SUBCUTANEOUS | Status: DC
  Administered 2016-11-25: 3 [IU] via SUBCUTANEOUS
  Administered 2016-11-27 – 2016-11-28 (×4): 2 [IU] via SUBCUTANEOUS
  Filled 2016-11-25 (×5): qty 1

## 2016-11-25 MED ORDER — ACETAMINOPHEN 325 MG PO TABS: 650.0000 mg | ORAL_TABLET | Freq: Four times a day (QID) | ORAL | Status: DC | PRN

## 2016-11-25 MED ORDER — FERROUS GLUCONATE 324 (38 FE) MG PO TABS
324.0000 mg | ORAL_TABLET | Freq: Every day | ORAL | Status: DC
  Administered 2016-11-25: 324 mg via ORAL
  Filled 2016-11-25 (×3): qty 1

## 2016-11-25 MED ORDER — SODIUM CHLORIDE 0.9 % IV SOLN
250.0000 mL | INTRAVENOUS | Status: DC | PRN
  Administered 2016-11-26: 250 mL via INTRAVENOUS
  Administered 2016-11-26: 1000 mL via INTRAVENOUS

## 2016-11-25 MED ORDER — PANTOPRAZOLE SODIUM 40 MG IV SOLR
40.0000 mg | Freq: Two times a day (BID) | INTRAVENOUS | Status: DC
  Administered 2016-11-25 (×2): 40 mg via INTRAVENOUS
  Filled 2016-11-25 (×2): qty 40

## 2016-11-25 MED ORDER — SODIUM CHLORIDE 0.9% FLUSH
3.0000 mL | Freq: Two times a day (BID) | INTRAVENOUS | Status: DC
  Administered 2016-11-25 – 2016-11-27 (×4): 3 mL via INTRAVENOUS

## 2016-11-25 MED ORDER — ONDANSETRON HCL 4 MG/2ML IJ SOLN: 4.0000 mg | Freq: Four times a day (QID) | INTRAMUSCULAR | Status: DC | PRN

## 2016-11-25 MED ORDER — ONDANSETRON HCL 4 MG PO TABS
4.0000 mg | ORAL_TABLET | Freq: Four times a day (QID) | ORAL | Status: DC | PRN
  Filled 2016-11-25 (×2): qty 1

## 2016-11-25 MED ORDER — ACETAMINOPHEN 650 MG RE SUPP: 650.0000 mg | Freq: Four times a day (QID) | RECTAL | Status: DC | PRN

## 2016-11-25 NOTE — Consult Note (Signed)
Cephas Darby, MD [address]  [place]  [place], [place] [address]  Main: [phone]  Fax: [phone] Pager: [phone]   Consultation  Referring Provider:     No ref. provider found Primary Care Physician:  Albina Billet, MD Primary Gastroenterologist:  Dr. Tiffany Kocher         Reason for Consultation:     Melena, anemia  Date of Admission:  11/25/2016 Date of Consultation:  11/25/2016         HPI:   Susan Barajas is a 71 y.o. female with know h/o IDA, PUD, recent admission 1week ago to [facility] with melena, Right LE DVT, SOB and anemia Hb 6.2, received 2 u PRBCs, IVC filter was placed and discharged home. She just started PPI on discharge. No aspirin use. She has been on oral iron. She was seen by Dr Tiffany Kocher in [place] clinic today and she appeared weak, SOB on O2 and reports having dark stools. She had neck pain and took Relafen twice a day in the early summer. Hb in clinic revealed as 7.6. Labs here are pending. Currently, she reports that her stools are brown  05/12/2014, colonoscopy for unexplained iron deficiency anemia revealed diverticulosis, small hemorrhoids.   She does have a history of upper endoscopy but the most recent study was performed 04/19/2008 following acute gastric ulcer. The EGD showed normal esophagus nodular mucosa of the greater curvature of the stomach. She was treated for H pylori infection with Prev Pac.  Past Medical History:  Diagnosis Date  . Asthma 1950  . Diabetes ([place]) Age 23   Type 2; insulin dependent  . Glaucoma 1949  . Hypertension   . Obesity, unspecified   . Personal history of tobacco use, presenting hazards to health   . Varicose veins of lower extremities with other complications     Past Surgical History:  Procedure Laterality Date  . BREAST BIOPSY Right 1991  . BREAST BIOPSY Left 2013  . COLONOSCOPY  2010   Dr. Vira Agar  . COLONOSCOPY  Jan 2016   Dr Vira Agar  . IVC FILTER INSERTION N/A 11/14/2016   Procedure: IVC  Filter Insertion;  Surgeon: Katha Cabal, MD;  Location: [facility];  Service: Cardiovascular;  Laterality: N/A;  . VEIN SURGERY Right 2006   Vein Closure Procedure; RF ablation of right GSV    Prior to Admission medications   Medication Sig Start Date End Date Taking? Authorizing Provider  acetaminophen (TYLENOL) 325 MG tablet Take 2 tablets (650 mg total) by mouth every 6 (six) hours as needed for mild pain (or Fever >/= 101). 11/17/16   Gouru, Aruna, MD  aspirin 81 MG chewable tablet Chew 1 tablet (81 mg total) by mouth daily. 10/26/16   Loletha Grayer, MD  ferrous gluconate (FERGON) 325 MG tablet Take 325 mg by mouth daily.    [provider]  furosemide (LASIX) 20 MG tablet Take 1 tablet (20 mg total) by mouth daily. 10/26/16   Loletha Grayer, MD  HUMALOG KWIKPEN 100 UNIT/ML KiwkPen Inject 10 Units into the skin 2 (two) times daily.  07/03/15   [provider]  insulin glargine (LANTUS) 100 UNIT/ML injection Inject 32 Units into the skin daily.     [provider]  latanoprost (XALATAN) 0.005 % ophthalmic solution 1 drop at bedtime.    [provider]  lovastatin (MEVACOR) 20 MG tablet Take 20 mg by mouth at bedtime.    [provider]  metFORMIN (  GLUCOPHAGE) 1000 MG tablet Take 1,000 mg by mouth daily.     [provider]  Multiple Vitamin (MULTIVITAMIN) tablet Take 1 tablet by mouth daily.    [provider]  omeprazole (PRILOSEC) 40 MG capsule Take 1 capsule (40 mg total) by mouth daily before breakfast. 11/17/16   Gouru, Aruna, MD  pioglitazone (ACTOS) 30 MG tablet Take 15 mg by mouth daily.     [provider]  sodium chloride 1 g tablet Take 1 tablet (1 g total) by mouth 2 (two) times daily with a meal. 10/25/16   Leslye Peer, Richard, MD  timolol (TIMOPTIC) 0.25 % ophthalmic solution Place 1 drop into both eyes 2 (two) times daily.  01/13/14   [provider]    Family History  Problem Relation  Age of Onset  . Prostate cancer Father   . Other Sister        mouth cancer  . Hypertension Mother      Social History  Substance Use Topics  . Smoking status: Former Smoker    Packs/day: 1.00    Years: 20.00  . Smokeless tobacco: Never Used  . Alcohol use No    Allergies as of 11/25/2016  . (No Known Allergies)    Review of Systems:    All systems reviewed and negative except where noted in HPI.   Physical Exam:  Vital signs in last 24 hours: Temp:  [97.9 F (36.6 C)-98 F (36.7 C)] 97.9 F (36.6 C) (08/14 1813) Pulse Rate:  [72-84] 72 (08/14 1813) Resp:  [17] 17 (08/14 1813) BP: (126-148)/(58-88) 126/58 (08/14 1813) SpO2:  [89 %-97 %] 97 % (08/14 1813) Weight:  [102.4 kg (225 lb 11.2 oz)] 102.4 kg (225 lb 11.2 oz) (08/14 1450) Last BM Date: 11/25/16 General:   Pleasant, cooperative in NAD Head:  Normocephalic and atraumatic. Eyes:   No icterus.   Conjunctiva pale. Ears:  Normal auditory acuity. Neck:  Supple; no masses or thyroidomegaly Lungs: Respirations even and unlabored. Lungs clear to auscultation bilaterally.   No wheezes, crackles, or rhonchi.  Heart:  Regular rate and rhythm;  Without murmur, clicks, rubs or gallops Abdomen:  Soft, nondistended, nontender. Normal bowel sounds. No appreciable masses or hepatomegaly.  No rebound or guarding.  Rectal:  Not performed. Msk:  Symmetrical without gross deformities.   Extremities:  Without edema, cyanosis or clubbing. Neurologic:  Alert and oriented x3;  grossly normal neurologically. Skin:  Intact without significant lesions or rashes. Cervical Nodes:  No significant cervical adenopathy. Psych:  Alert and cooperative. Normal affect.  LAB RESULTS: No results for input(s): WBC, HGB, HCT, PLT in the last 72 hours. BMET No results for input(s): NA, K, CL, CO2, GLUCOSE, BUN, CREATININE, CALCIUM in the last 72 hours. LFT No results for input(s): PROT, ALBUMIN, AST, ALT, ALKPHOS, BILITOT, BILIDIR, IBILI in the  last 72 hours. PT/INR No results for input(s): LABPROT, INR in the last 72 hours.  STUDIES: No results found.    Impression / Plan:   Susan Barajas is a 71 y.o. y/o female with chronic IDA, recent admission with melena and acute anemia s/p 2 U PRBCs, also with Rt LE DVT s/p IVC filter readmitted with anemia, hb 7.6. Apparently her hb on discharge was 7.9.   - continue PPI 22m IV BID - ferritin, iron studies, B12 and folate levels - IV iron if needed - Perform EGD, if negative plan for VCE - NPO past midnight - Avoid NSAIDs   Thank you for  involving me in the care of this patient.      LOS: 0 days   Sherri Sear, MD  11/25/2016, 7:10 PM

## 2016-11-25 NOTE — H&P (Signed)
[place] at [place] NAME: Susan Kinyon    MR#:  [id number]  DATE OF BIRTH:  [birth year]  DATE OF ADMISSION:  11/25/2016  PRIMARY CARE PHYSICIAN: Albina Billet, MD   REQUESTING/REFERRING PHYSICIAN:   CHIEF COMPLAINT:  No chief complaint on file.   HISTORY OF PRESENT ILLNESS: Susan Barajas  is a 71 y.o. female with a known history of bronchial asthma, type 2 diabetes mellitus presented to the emergency room after she was referred from the gastroenterology clinic. Patient was referred for shortness of breath and weakness and fatigue. She was supposed to have EGD and colonoscopy at the endoscopy Center outpatient. Patient still has melanotic stool. Her hemoglobin was low when they was checked at the clinic was around 7.6. She was referred to the hospitalist service for blood transfusion and further workup. No complaints of any abdominal pain. No complaints of chest pain.  PAST MEDICAL HISTORY:   Past Medical History:  Diagnosis Date  . Asthma 1950  . Diabetes ([place]) Age 21   Type 2; insulin dependent  . Glaucoma 1949  . Hypertension   . Obesity, unspecified   . Personal history of tobacco use, presenting hazards to health   . Varicose veins of lower extremities with other complications     PAST SURGICAL HISTORY:  Past Surgical History:  Procedure Laterality Date  . BREAST BIOPSY Right 1991  . BREAST BIOPSY Left 2013  . COLONOSCOPY  2010   Dr. Vira Agar  . COLONOSCOPY  Jan 2016   Dr Vira Agar  . IVC FILTER INSERTION N/A 11/14/2016   Procedure: IVC Filter Insertion;  Surgeon: Katha Cabal, MD;  Location: [facility];  Service: Cardiovascular;  Laterality: N/A;  . VEIN SURGERY Right 2006   Vein Closure Procedure; RF ablation of right GSV    SOCIAL HISTORY:  Social History  Substance Use Topics  . Smoking status: Former Smoker    Packs/day: 1.00    Years: 20.00  . Smokeless tobacco: Never Used  . Alcohol use No     FAMILY HISTORY:  Family History  Problem Relation Age of Onset  . Prostate cancer Father   . Other Sister        mouth cancer  . Hypertension Mother     DRUG ALLERGIES: No Known Allergies  REVIEW OF SYSTEMS:   CONSTITUTIONAL: No fever, has weakness.  EYES: No blurred or double vision.  EARS, NOSE, AND THROAT: No tinnitus or ear pain.  RESPIRATORY: No cough, has shortness of breath,  No wheezing or hemoptysis.  CARDIOVASCULAR: No chest pain, orthopnea,  Has lower extremity edema.  GASTROINTESTINAL: No nausea, vomiting, diarrhea or abdominal pain.  GENITOURINARY: No dysuria, hematuria.  ENDOCRINE: No polyuria, nocturia,  HEMATOLOGY: No anemia, easy bruising or bleeding SKIN: No rash or lesion. MUSCULOSKELETAL: No joint pain or arthritis.   NEUROLOGIC: No tingling, numbness, weakness.  PSYCHIATRY: No anxiety or depression.   MEDICATIONS AT HOME:  Prior to Admission medications   Medication Sig Start Date End Date Taking? Authorizing Provider  acetaminophen (TYLENOL) 325 MG tablet Take 2 tablets (650 mg total) by mouth every 6 (six) hours as needed for mild pain (or Fever >/= 101). 11/17/16   Gouru, Aruna, MD  aspirin 81 MG chewable tablet Chew 1 tablet (81 mg total) by mouth daily. 10/26/16   Loletha Grayer, MD  ferrous gluconate (FERGON) 325 MG tablet Take 325 mg by mouth daily.    [provider]  furosemide (LASIX) 20 MG tablet Take 1 tablet (20 mg total) by mouth daily. 10/26/16   Loletha Grayer, MD  HUMALOG KWIKPEN 100 UNIT/ML KiwkPen Inject 10 Units into the skin 2 (two) times daily.  07/03/15   [provider]  insulin glargine (LANTUS) 100 UNIT/ML injection Inject 32 Units into the skin daily.     [provider]  latanoprost (XALATAN) 0.005 % ophthalmic solution 1 drop at bedtime.    [provider]  lovastatin (MEVACOR) 20 MG tablet Take 20 mg by mouth at bedtime.    [provider]  metFORMIN (GLUCOPHAGE) 1000 MG tablet  Take 1,000 mg by mouth daily.     [provider]  Multiple Vitamin (MULTIVITAMIN) tablet Take 1 tablet by mouth daily.    [provider]  omeprazole (PRILOSEC) 40 MG capsule Take 1 capsule (40 mg total) by mouth daily before breakfast. 11/17/16   Gouru, Aruna, MD  pioglitazone (ACTOS) 30 MG tablet Take 15 mg by mouth daily.     [provider]  sodium chloride 1 g tablet Take 1 tablet (1 g total) by mouth 2 (two) times daily with a meal. 10/25/16   Leslye Peer, Richard, MD  timolol (TIMOPTIC) 0.25 % ophthalmic solution Place 1 drop into both eyes 2 (two) times daily.  01/13/14   [provider]      PHYSICAL EXAMINATION:   VITAL SIGNS: Blood pressure (!) 148/66, pulse 76, temperature 97.9 F (36.6 C), temperature source Oral, SpO2 (!) 89 %.  GENERAL:  71 y.o.-year-old patient lying in the bed with no acute distress.  EYES: Pupils equal, round, reactive to light and accommodation. No scleral icterus, pallor present. Extraocular muscles intact.  HEENT: Head atraumatic, normocephalic. Oropharynx and nasopharynx clear.  NECK:  Supple, no jugular venous distention. No thyroid enlargement, no tenderness.  LUNGS: Normal breath sounds bilaterally, no wheezing, rales,rhonchi or crepitation. No use of accessory muscles of respiration.  CARDIOVASCULAR: S1, S2 normal. No murmurs, rubs, or gallops.  ABDOMEN: Soft, nontender, nondistended. Bowel sounds present. No organomegaly or mass.  EXTREMITIES: Has pedal edema,  No cyanosis, or clubbing.  NEUROLOGIC: Cranial nerves II through XII are intact. Muscle strength 5/5 in all extremities. Sensation intact. Gait not checked.  PSYCHIATRIC: The patient is alert and oriented x 3.  SKIN: No obvious rash, lesion, or ulcer.   LABORATORY PANEL:   CBC No results for input(s): WBC, HGB, HCT, PLT, MCV, MCH, MCHC, RDW, LYMPHSABS, MONOABS, EOSABS, BASOSABS, BANDABS in the last 168 hours.  Invalid input(s): NEUTRABS,  BANDSABD ------------------------------------------------------------------------------------------------------------------  Chemistries  No results for input(s): NA, K, CL, CO2, GLUCOSE, BUN, CREATININE, CALCIUM, MG, AST, ALT, ALKPHOS, BILITOT in the last 168 hours.  Invalid input(s): GFRCGP ------------------------------------------------------------------------------------------------------------------ estimated creatinine clearance is 75.8 mL/min (by C-G formula based on SCr of 0.67 mg/dL). ------------------------------------------------------------------------------------------------------------------ No results for input(s): TSH, T4TOTAL, T3FREE, THYROIDAB in the last 72 hours.  Invalid input(s): FREET3   Coagulation profile No results for input(s): INR, PROTIME in the last 168 hours. ------------------------------------------------------------------------------------------------------------------- No results for input(s): DDIMER in the last 72 hours. -------------------------------------------------------------------------------------------------------------------  Cardiac Enzymes No results for input(s): CKMB, TROPONINI, MYOGLOBIN in the last 168 hours.  Invalid input(s): CK ------------------------------------------------------------------------------------------------------------------ Invalid input(s): POCBNP  ---------------------------------------------------------------------------------------------------------------  Urinalysis    Component Value Date/Time   COLORURINE YELLOW (A) 10/22/2016 1140   APPEARANCEUR CLEAR (A) 10/22/2016 1140   LABSPEC 1.006 10/22/2016 1140   PHURINE 7.0 10/22/2016 1140   GLUCOSEU NEGATIVE 10/22/2016 1140   HGBUR NEGATIVE 10/22/2016 1140  BILIRUBINUR NEGATIVE 10/22/2016 [place] 10/22/2016 1140   PROTEINUR NEGATIVE 10/22/2016 1140   NITRITE NEGATIVE 10/22/2016 1140   LEUKOCYTESUR SMALL (A) 10/22/2016 1140      RADIOLOGY: No results found.  EKG: Orders placed or performed during the hospital encounter of 10/22/16  . ED EKG  . ED EKG  . EKG 12-Lead  . EKG 12-Lead    IMPRESSION AND PLAN: 71 year old female patient with history of glaucoma, diabetes mellitus, hypertension was referred from gastroenterology clinic for symptomatic anemia and guaiac positive stool. Admitting diagnosis 1. Symptomatic anemia 2. Acute gastrointestinal bleeding 3. Diabetes mellitus type 2 4. Hypertension Treatment plan Admit patient to medical floor Transfuse 1 unit PRBC IV Monitor hemoglobin and hematocrit Clear liquid diet Gastroenterology consultation for endoscopy and colonoscopy DVT prophylaxis with sequential compression devices to lower extremities  All the records are reviewed and case discussed with ED provider. Management plans discussed with the patient, family and they are in agreement.  CODE STATUS:FULL CODE    Code Status Orders        Start     Ordered   11/25/16 1516  Full code  Continuous     11/25/16 1515    Code Status History    Date Active Date Inactive Code Status Order ID Comments User Context   11/14/2016  5:06 PM 11/18/2016  5:48 PM Full Code [id number]  Nicholes Mango, MD Inpatient   10/22/2016  2:33 PM 10/26/2016  4:50 PM Full Code [id number]  Gladstone Lighter, MD Inpatient       TOTAL TIME TAKING CARE OF THIS PATIENT: 52 minutes.    Saundra Shelling M.D on 11/25/2016 at 3:29 PM  Between 7am to 6pm - Pager - [phone]  After 6pm go to [email or web address] - password [email or web address] [facility]  [facility]  Office  [phone]  CC: Primary care physician; Albina Billet, MD

## 2016-11-25 NOTE — Progress Notes (Signed)
Chaplain rounding unit visited with pt. Pt was lying on the bed and had family at bedside. Pt stated she had a bleeding condition that the dcs are still evaluating. Pt appeared hopeful and was surrounded by her family. [place] provided spiritual support and ministry of presence.

## 2016-11-26 ENCOUNTER — Inpatient Hospital Stay: Payer: Medicare Other | Admitting: Anesthesiology

## 2016-11-26 ENCOUNTER — Encounter
Admission: AD | Disposition: A | Discharge status: Home Health | Payer: Self-pay | Source: Ambulatory Visit | Attending: Internal Medicine

## 2016-11-26 ENCOUNTER — Encounter: Payer: Self-pay | Admitting: Certified Registered Nurse Anesthetist

## 2016-11-26 ENCOUNTER — Inpatient Hospital Stay: Payer: Medicare Other | Admitting: Internal Medicine

## 2016-11-26 DIAGNOSIS — K922 Gastrointestinal hemorrhage, unspecified: Secondary | ICD-10-CM

## 2016-11-26 DIAGNOSIS — D649 Anemia, unspecified: Secondary | ICD-10-CM

## 2016-11-26 HISTORY — PX: ESOPHAGOGASTRODUODENOSCOPY: SHX5428

## 2016-11-26 HISTORY — PX: GIVENS CAPSULE STUDY: SHX5432

## 2016-11-26 LAB — VITAMIN B12: Vitamin B-12: 1118 pg/mL — ABNORMAL HIGH (ref 180–914)

## 2016-11-26 LAB — BASIC METABOLIC PANEL
ANION GAP: 7 (ref 5–15)
BUN: <5 mg/dL — ABNORMAL LOW (ref 6–20)
CALCIUM: 8.5 mg/dL — AB (ref 8.9–10.3)
CO2: 29 mmol/L (ref 22–32)
CREATININE: 0.63 mg/dL (ref 0.44–1.00)
Chloride: 104 mmol/L (ref 101–111)
GLUCOSE: 84 mg/dL (ref 65–99)
Potassium: 3.1 mmol/L — ABNORMAL LOW (ref 3.5–5.1)
Sodium: 140 mmol/L (ref 135–145)

## 2016-11-26 LAB — GLUCOSE, CAPILLARY
GLUCOSE-CAPILLARY: 94 mg/dL (ref 65–99)
GLUCOSE-CAPILLARY: 87 mg/dL (ref 65–99)
GLUCOSE-CAPILLARY: 145 mg/dL — AB (ref 65–99)
Glucose-Capillary: 56 mg/dL — ABNORMAL LOW (ref 65–99)

## 2016-11-26 LAB — HEMOGLOBIN AND HEMATOCRIT, BLOOD
HEMATOCRIT: 25.2 % — AB (ref 35.0–47.0)
HEMATOCRIT: 26.9 % — AB (ref 35.0–47.0)
HEMATOCRIT: 27.5 % — AB (ref 35.0–47.0)
HEMOGLOBIN: 7.8 g/dL — AB (ref 12.0–16.0)
HEMOGLOBIN: 8.4 g/dL — AB (ref 12.0–16.0)
Hemoglobin: 8.1 g/dL — ABNORMAL LOW (ref 12.0–16.0)

## 2016-11-26 LAB — TYPE AND SCREEN
ABO/RH(D): B POS
ANTIBODY SCREEN: NEGATIVE
UNIT DIVISION: 0

## 2016-11-26 LAB — BPAM RBC
BLOOD PRODUCT EXPIRATION DATE: 201809062359
ISSUE DATE / TIME: 201808141741
Unit Type and Rh: 7300

## 2016-11-26 LAB — MAGNESIUM: MAGNESIUM: 1.6 mg/dL — AB (ref 1.7–2.4)

## 2016-11-26 SURGERY — EGD (ESOPHAGOGASTRODUODENOSCOPY)
Anesthesia: General

## 2016-11-26 MED ORDER — LIDOCAINE HCL (PF) 2 % IJ SOLN
INTRAMUSCULAR | Status: AC
  Filled 2016-11-26: qty 2

## 2016-11-26 MED ORDER — PANTOPRAZOLE SODIUM 40 MG PO TBEC
40.0000 mg | DELAYED_RELEASE_TABLET | Freq: Two times a day (BID) | ORAL | Status: DC
  Administered 2016-11-26 – 2016-11-28 (×5): 40 mg via ORAL
  Filled 2016-11-26 (×5): qty 1

## 2016-11-26 MED ORDER — LIDOCAINE HCL (CARDIAC) 20 MG/ML IV SOLN
INTRAVENOUS | Status: DC | PRN
  Administered 2016-11-26: 30 mg via INTRAVENOUS

## 2016-11-26 MED ORDER — PRAVASTATIN SODIUM 20 MG PO TABS
20.0000 mg | ORAL_TABLET | Freq: Every day | ORAL | Status: DC
Start: 2016-11-26 — End: 2016-11-28
  Administered 2016-11-26 – 2016-11-27 (×2): 20 mg via ORAL
  Filled 2016-11-26 (×2): qty 1

## 2016-11-26 MED ORDER — MAGNESIUM SULFATE 2 GM/50ML IV SOLN
2.0000 g | Freq: Once | INTRAVENOUS | Status: AC
Start: 2016-11-26 — End: 2016-11-26
  Administered 2016-11-26: 2 g via INTRAVENOUS
  Filled 2016-11-26: qty 50

## 2016-11-26 MED ORDER — PROPOFOL 10 MG/ML IV BOLUS
INTRAVENOUS | Status: AC
  Filled 2016-11-26: qty 20

## 2016-11-26 MED ORDER — PROPOFOL 500 MG/50ML IV EMUL
INTRAVENOUS | Status: DC | PRN
  Administered 2016-11-26: 140 ug/kg/min via INTRAVENOUS

## 2016-11-26 MED ORDER — SODIUM CHLORIDE 0.9 % IV SOLN
510.0000 mg | Freq: Once | INTRAVENOUS | Status: AC
  Administered 2016-11-26: 510 mg via INTRAVENOUS
  Filled 2016-11-26: qty 17

## 2016-11-26 MED ORDER — POTASSIUM CHLORIDE 20 MEQ PO PACK
40.0000 meq | PACK | Freq: Once | ORAL | Status: AC
  Administered 2016-11-26: 40 meq via ORAL
  Filled 2016-11-26: qty 2

## 2016-11-26 MED ORDER — PROPOFOL 10 MG/ML IV BOLUS
INTRAVENOUS | Status: DC | PRN
  Administered 2016-11-26: 40 mg via INTRAVENOUS

## 2016-11-26 MED ORDER — PROPOFOL 500 MG/50ML IV EMUL
INTRAVENOUS | Status: AC
  Filled 2016-11-26: qty 50

## 2016-11-26 NOTE — Consult Note (Signed)
  Cephas Darby, MD [address]  [place]  [place], [place] [address]  Main: [phone]  Fax: [phone] Pager: [phone]   Susan Barajas is being followed for IDA Day 1 of follow up    Subjective: Received 2 u PRBCs, no active bleeding   Objective: Vital signs in last 24 hours: Vitals:   11/25/16 1813 11/25/16 2055 11/26/16 0539 11/26/16 0914  BP: (!) 126/58 114/61 (!) 107/55 112/62  Pulse: 72 74 78 71  Resp: _0 Temp: 97.9 F (36.6 C) 97.8 F (36.6 C) 97.8 F (36.6 C) 98.9 F (37.2 C)  TempSrc: Oral Oral Oral Tympanic  SpO2: 97% 100% 98% 99%  Weight:      Height:       Weight change:   Intake/Output Summary (Last 24 hours) at 11/26/16 1136 Last data filed at 11/26/16 1132  Gross per 24 hour  Intake              919 ml  Output                0 ml  Net              919 ml     Exam: Heart:: Regular rate and rhythm Lungs: clear to auscultation Abdomen: soft, nontender, normal bowel sounds   Lab Results: _1 @ Micro Results: No results found for this or any previous visit (from the past 240 hour(s)). Studies/Results: No results found. Medications: I have reviewed the patient's current medications. Scheduled Meds: . [MAR Hold] insulin aspart  0-5 Units Subcutaneous QHS  . [MAR Hold] insulin aspart  0-9 Units Subcutaneous TID WC  . [MAR Hold] latanoprost  1 drop Both Eyes QHS  . [MAR Hold] pantoprazole (PROTONIX) IV  40 mg Intravenous Q12H  . [MAR Hold] sodium chloride flush  3 mL Intravenous Q12H  . [MAR Hold] timolol  1 drop Both Eyes BID   Continuous Infusions: . [MAR Hold] sodium chloride 1,000 mL (11/26/16 0922)   PRN Meds:.[MAR Hold] sodium chloride, [MAR Hold] acetaminophen **OR** [MAR Hold] acetaminophen, [MAR Hold] ondansetron **OR** [MAR Hold] ondansetron (ZOFRAN) IV, [MAR Hold] sodium chloride flush   Assessment: Active Problems:   Anemia EGD 11/26/16 - Normal duodenal bulb, second portion of the duodenum  and third portion of the duodenum.  - Nodular mucosa in the gastric body.  Biopsied.  - Erythematous mucosa in the antrum.  - Normal gastroesophageal junction and esophagus.   Plan: - Clear liquid diet.  - Continue present medications.  - Await pathology results.  - To visualize the small bowel, perform video capsule endoscopy today.  - Iron studies c/w iron deficiency anemia. Recommend feraheme x 1dose today - Follow with Dr Tiffany Kocher as out pt   LOS: 1 day   Micheil Klaus 11/26/2016, 11:36 AM

## 2016-11-26 NOTE — Progress Notes (Signed)
Givens capsule study explained to patient preprocedure.  Patient swallowed the capsule when awake in post endoscopy and report called to [facility]. Belt and equipment to come off patient at 2220 tonight and we will pick up equipment in the am.

## 2016-11-26 NOTE — Anesthesia Postprocedure Evaluation (Signed)
Anesthesia Post Note  Patient: Susan Barajas  Procedure(s) Performed: Procedure(s) (LRB): ESOPHAGOGASTRODUODENOSCOPY (EGD) (N/A) GIVENS CAPSULE STUDY, if EGD negative, plan to drop capsule with scope if EGD negative (N/A)  Patient location during evaluation: Endoscopy Anesthesia Type: General Level of consciousness: awake and alert Pain management: pain level controlled Vital Signs Assessment: post-procedure vital signs reviewed and stable Respiratory status: spontaneous breathing and respiratory function stable Cardiovascular status: stable Anesthetic complications: no     Last Vitals:  Vitals:   11/26/16 1145 11/26/16 1153  BP: 119/63 123/62  Pulse: 78 75  Resp: 18 18  Temp: 37.2 C   SpO2: 100% 100%    Last Pain:  Vitals:   11/26/16 1145  TempSrc: Tympanic  PainSc:                  KEPHART,WILLIAM K

## 2016-11-26 NOTE — Progress Notes (Addendum)
[facility] at [place] NAME: Susan Barajas    MR#:  [id number]  DATE OF BIRTH:  09/01/1945  SUBJECTIVE:  CHIEF COMPLAINT:  No chief complaint on file.  Recent GI bleed, had procedure done, sent back from clinic as again have malena and fatigue.  EGD done- showed some gastritis, no clear source- so given capsule endoscopy. Also received one unoit PRBC overnight and now IV iron.  REVIEW OF SYSTEMS:  CONSTITUTIONAL: No fever,positive for fatigue or weakness.  EYES: No blurred or double vision.  EARS, NOSE, AND THROAT: No tinnitus or ear pain.  RESPIRATORY: No cough, shortness of breath, wheezing or hemoptysis.  CARDIOVASCULAR: No chest pain, orthopnea, edema.  GASTROINTESTINAL: No nausea, vomiting, diarrhea or abdominal pain.  GENITOURINARY: No dysuria, hematuria.  ENDOCRINE: No polyuria, nocturia,  HEMATOLOGY: No anemia, easy bruising or bleeding SKIN: No rash or lesion. MUSCULOSKELETAL: No joint pain or arthritis.   NEUROLOGIC: No tingling, numbness, weakness.  PSYCHIATRY: No anxiety or depression.   ROS  DRUG ALLERGIES:  No Known Allergies  VITALS:  Blood pressure (!) 135/59, pulse 72, temperature 98.3 F (36.8 C), temperature source Oral, resp. rate 18, height _0  (1.676 m), weight 102.4 kg (225 lb 11.2 oz), SpO2 97 %.  PHYSICAL EXAMINATION:  GENERAL:  71 y.o.-year-old patient lying in the bed with no acute distress.  EYES: Pupils equal, round, reactive to light and accommodation. No scleral icterus. Extraocular muscles intact.  HEENT: Head atraumatic, normocephalic. Oropharynx and nasopharynx clear.  NECK:  Supple, no jugular venous distention. No thyroid enlargement, no tenderness.  LUNGS: Normal breath sounds bilaterally, no wheezing, rales,rhonchi or crepitation. No use of accessory muscles of respiration.  CARDIOVASCULAR: S1, S2 normal. No murmurs, rubs, or gallops.  ABDOMEN: Soft, nontender, nondistended. Bowel sounds  present. No organomegaly or mass.  EXTREMITIES: No pedal edema, cyanosis, or clubbing.  NEUROLOGIC: Cranial nerves II through XII are intact. Muscle strength 5/5 in all extremities. Sensation intact. Gait not checked.  PSYCHIATRIC: The patient is alert and oriented x 3.  SKIN: No obvious rash, lesion, or ulcer.   Physical Exam LABORATORY PANEL:   CBC  Recent Labs Lab 11/26/16 1334  HGB 8.1*  HCT 26.9*   ------------------------------------------------------------------------------------------------------------------  Chemistries   Recent Labs Lab 11/26/16 0115 11/26/16 1334  NA 140  --   K 3.1*  --   CL 104  --   CO2 29  --   GLUCOSE 84  --   BUN <5*  --   CREATININE 0.63  --   CALCIUM 8.5*  --   MG  --  1.6*   ------------------------------------------------------------------------------------------------------------------  Cardiac Enzymes No results for input(s): TROPONINI in the last 168 hours. ------------------------------------------------------------------------------------------------------------------  RADIOLOGY:  No results found.  ASSESSMENT AND PLAN:   Active Problems:   Anemia  * Symptomatic anemia due to blood loss * Acute gastrointestinal bleeding    Received 1 unit PRBC   EGD done, no clear source   Now capsule endoscopy is under way.   IV iron given, monitor Hb.   * Diabetes mellitus type 2- Insulin sliding scale  * Hypertension   Monitor for now.  * hypokalemia   Hypomagnesemia   Replace.  All the records are reviewed and case discussed with Care Management/Social Workerr. Management plans discussed with the patient, family and they are in agreement.  CODE STATUS: Full.  TOTAL TIME TAKING CARE OF THIS PATIENT: 35 minutes.    POSSIBLE D/C IN 1-2 DAYS, DEPENDING  ON CLINICAL CONDITION.   Vaughan Basta M.D on 11/26/2016   Between 7am to 6pm - Pager - [phone]  After 6pm go to [email or web address] - password [email or web address]  [facility]  Office  [phone]  CC: Primary care physician; Albina Billet, MD  Note: This dictation was prepared with Dragon dictation along with smaller phrase technology. Any transcriptional errors that result from this process are unintentional.

## 2016-11-26 NOTE — H&P (Signed)
Cephas Darby, MD [address]  [place]  [place], [place] [address]  Main: [phone]  Fax: [phone] Pager: [phone]  Primary Care Physician:  Albina Billet, MD Primary Gastroenterologist:  Dr. Cephas Darby  Pre-Procedure History & Physical: HPI:  Susan Barajas is a 71 y.o. female is here for an endoscopy.   Past Medical History:  Diagnosis Date  . Asthma 1950  . 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 Glaucoma 1949  . Hypertension   . Obesity, unspecified   . Personal history of tobacco use, presenting hazards to health   . Varicose veins of lower extremities with other complications     Past Surgical History:  Procedure Laterality Date  . BREAST BIOPSY Right 1991  . BREAST BIOPSY Left 2013  . COLONOSCOPY  2010   Dr. Vira Agar  . COLONOSCOPY  Jan 2016   Dr Vira Agar  . IVC FILTER INSERTION N/A 11/14/2016   Procedure: IVC Filter Insertion;  Surgeon: Katha Cabal, MD;  Location: [facility];  Service: Cardiovascular;  Laterality: N/A;  . VEIN SURGERY Right 2006   Vein Closure Procedure; RF ablation of right GSV    Prior to Admission medications   Medication Sig Start Date End Date Taking? Authorizing Provider  acetaminophen (TYLENOL) 325 MG tablet Take 2 tablets (650 mg total) by mouth every 6 (six) hours as needed for mild pain (or Fever >/= 101). 11/17/16   Gouru, Aruna, MD  aspirin 81 MG chewable tablet Chew 1 tablet (81 mg total) by mouth daily. 10/26/16   Loletha Grayer, MD  ferrous gluconate (FERGON) 325 MG tablet Take 325 mg by mouth daily.    [provider]  furosemide (LASIX) 20 MG tablet Take 1 tablet (20 mg total) by mouth daily. 10/26/16   Loletha Grayer, MD  HUMALOG KWIKPEN 100 UNIT/ML KiwkPen Inject 10 Units into the skin 2 (two) times daily.  07/03/15   [provider]  insulin glargine (LANTUS) 100 UNIT/ML injection Inject 32 Units into the skin daily.     [provider]    latanoprost (XALATAN) 0.005 % ophthalmic solution 1 drop at bedtime.    [provider]  lovastatin (MEVACOR) 20 MG tablet Take 20 mg by mouth at bedtime.    [provider]  metFORMIN (GLUCOPHAGE) 1000 MG tablet Take 1,000 mg by mouth daily.     [provider]  Multiple Vitamin (MULTIVITAMIN) tablet Take 1 tablet by mouth daily.    [provider]  omeprazole (PRILOSEC) 40 MG capsule Take 1 capsule (40 mg total) by mouth daily before breakfast. 11/17/16   Gouru, Aruna, MD  pioglitazone (ACTOS) 30 MG tablet Take 15 mg by mouth daily.     [provider]  sodium chloride 1 g tablet Take 1 tablet (1 g total) by mouth 2 (two) times daily with a meal. 10/25/16   Leslye Peer, Richard, MD  timolol (TIMOPTIC) 0.25 % ophthalmic solution Place 1 drop into both eyes 2 (two) times daily.  01/13/14   [provider]    Allergies as of 11/25/2016  . (No Known Allergies)    Family History  Problem Relation Age of Onset  . Prostate cancer Father   . Other Sister        mouth cancer  . Hypertension Mother     Social History   Social History  . Marital status: Single    Spouse name: N/A  . Number  of children: N/A  . Years of education: N/A   Occupational History  . 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 Smoking status: Former Smoker    Packs/day: 1.00    Years: 20.00  . Smokeless tobacco: Never Used  . Alcohol use No  . Drug use: No  . Sexual activity: Not Currently   Other Topics Concern  . Not on file   Social History Narrative   Independent at baseline. Lives at home with her brother    Review of Systems: See HPI, otherwise negative ROS  Physical Exam: BP 112/62   Pulse 71   Temp 98.9 F (37.2 C) (Tympanic)   Resp 20   Ht _0  (1.676 m)   Wt 102.4 kg (225 lb 11.2 oz)   SpO2 99%   BMI 36.43 kg/m  General:   Alert,  pleasant and cooperative in NAD Head:  Normocephalic and atraumatic. Neck:  Supple; no masses or  thyromegaly. Lungs:  Clear throughout to auscultation.    Heart:  Regular rate and rhythm. Abdomen:  Soft, nontender and nondistended. Normal bowel sounds, without guarding, and without rebound.   Neurologic:  Alert and  oriented x4;  grossly normal neurologically.  Impression/Plan: Susan Barajas is here for an endoscopy to be performed for anemia  Risks, benefits, limitations, and alternatives regarding  endoscopy have been reviewed with the patient.  Questions have been answered.  All parties agreeable.   Sherri Sear, MD  11/26/2016, 10:58 AM

## 2016-11-26 NOTE — Transfer of Care (Signed)
Immediate Anesthesia Transfer of Care Note  Patient: [place] Anna Fitting  Procedure(s) Performed: Procedure(s): ESOPHAGOGASTRODUODENOSCOPY (EGD) (N/A) GIVENS CAPSULE STUDY, if EGD negative, plan to drop capsule with scope if EGD negative (N/A)  Patient Location: PACU  Anesthesia Type:General  Level of Consciousness: awake and alert   Airway & Oxygen Therapy: Patient Spontanous Breathing and Patient connected to nasal cannula oxygen  Post-op Assessment: Report given to RN and Post -op Vital signs reviewed and stable  Post vital signs: Reviewed and stable  Last Vitals:  Vitals:   11/26/16 0914 11/26/16 1143  BP: 112/62 119/63  Pulse: 71 99  Resp: 20 19  Temp: 37.2 C (!) 35.9 C  SpO2: 99% 99%    Last Pain:  Vitals:   11/26/16 1143  TempSrc: Tympanic  PainSc:          Complications: No apparent anesthesia complications

## 2016-11-26 NOTE — Op Note (Signed)
[facility] Gastroenterology Patient Name: Susan Barajas Procedure Date: 11/26/2016 10:51 AM MRN: [id number] Account #: [id number] Date of Birth: Sep 06, 1945 Admit Type: Inpatient Age: 71 Room: [facility] ENDO ROOM 4 Gender: Female Note Status: Finalized Procedure:            Upper GI endoscopy Indications:          Suspected upper gastrointestinal bleeding in patient                        with unexplained iron deficiency anemia, Iron                        deficiency anemia with no gastrointestinal bleeding                        source identified during previous colonoscopy Providers:            Lin Landsman MD, MD Referring MD:         Saundra Shelling (Referring MD) Medicines:            Monitored Anesthesia Care Complications:        No immediate complications. Estimated blood loss: None. Procedure:            Pre-Anesthesia Assessment:                       - Prior to the procedure, a History and Physical was                        performed, and patient medications and allergies were                        reviewed. The patient is competent. The risks and                        benefits of the procedure and the sedation options and                        risks were discussed with the patient. All questions                        were answered and informed consent was obtained.                        Patient identification and proposed procedure were                        verified by the physician, the nurse, the                        anesthesiologist, the anesthetist and the technician in                        the pre-procedure area in the procedure room. Mental                        Status Examination: alert and oriented. Airway                        Examination: normal oropharyngeal airway and neck  mobility. Respiratory Examination: clear to                        auscultation. CV Examination: normal. Prophylactic          Antibiotics: The patient does not require prophylactic                        antibiotics. Prior Anticoagulants: The patient has                        taken no previous anticoagulant or antiplatelet agents.                        ASA Grade Assessment: III - A patient with severe                        systemic disease. After reviewing the risks and                        benefits, the patient was deemed in satisfactory                        condition to undergo the procedure. The anesthesia plan                        was to use monitored anesthesia care (MAC). Immediately                        prior to administration of medications, the patient was                        re-assessed for adequacy to receive sedatives. The                        heart rate, respiratory rate, oxygen saturations, blood                        pressure, adequacy of pulmonary ventilation, and                        response to care were monitored throughout the                        procedure. The physical status of the patient was                        re-assessed after the procedure.                       After obtaining informed consent, the endoscope was                        passed under direct vision. Throughout the procedure,                        the patient's blood pressure, pulse, and oxygen                        saturations were monitored continuously. The Endoscope  was introduced through the mouth, and advanced to the                        third part of duodenum. The upper GI endoscopy was                        accomplished without difficulty. The patient tolerated                        the procedure fairly well. Findings:      The duodenal bulb, second portion of the duodenum and third portion of       the duodenum were normal.      Diffuse nodular mucosa was found in the gastric body. Biopsies were       taken with a cold forceps for Helicobacter pylori  testing.      Striped mildly erythematous mucosa without bleeding was found in the       gastric antrum.      The cardia and gastric fundus were normal on retroflexion.      Esophagogastric landmarks were identified: the gastroesophageal junction       was found at 40 cm from the incisors.      The gastroesophageal junction and examined esophagus were normal.      Patient desaturated temporarily during procedure. Video capsule       attempted to deploy with endoscope but unsuccessful due to technical       problem and patient's respiratory status Impression:           - Normal duodenal bulb, second portion of the duodenum                        and third portion of the duodenum.                       - Nodular mucosa in the gastric body. Biopsied.                       - Erythematous mucosa in the antrum.                       - Esophagogastric landmarks identified.                       - Normal gastroesophageal junction and esophagus. Recommendation:       - Return patient to hospital ward for ongoing care.                       - Clear liquid diet.                       - Continue present medications.                       - Await pathology results.                       - To visualize the small bowel, perform video capsule                        endoscopy today.                       -  Iron studies c/w iron deficiency anemia. Recommend                        feraheme x 1dose today Procedure Code(s):    --- Professional ---                       [phone], Esophagogastroduodenoscopy, flexible, transoral;                        with biopsy, single or multiple Diagnosis Code(s):    --- Professional ---                       K31.89, Other diseases of stomach and duodenum                       D50.9, Iron deficiency anemia, unspecified CPT copyright 2016 American Medical Association. All rights reserved. The codes documented in this report are preliminary and upon coder review may  be  revised to meet current compliance requirements. Dr. Ulyess Mort Lin Landsman MD, MD 11/26/2016 11:39:25 AM This report has been signed electronically. Number of Addenda: 0 Note Initiated On: 11/26/2016 10:51 AM      [facility]

## 2016-11-26 NOTE — Progress Notes (Deleted)
[facility] NOTE  Patient Care Team: Albina Billet, MD as PCP - General (Unknown Physician Specialty) Christene Lye, MD (General Surgery) Albina Billet, MD (Internal Medicine) Schnier, Dolores Lory, MD (Vascular Surgery) Wilford Corner, MD as Consulting Physician (Gastroenterology)  CHIEF COMPLAINTS/PURPOSE OF CONSULTATION:  ***  #   No history exists.     HISTORY OF PRESENTING ILLNESS:  Susan Barajas 71 y.o.  female     ROS: A complete 10 point review of system is done which is negative except mentioned above in history of present illness  MEDICAL HISTORY:  Past Medical History:  Diagnosis Date  . Asthma 1950  . Diabetes ([place]) Age 68   Type 2; insulin dependent  . Glaucoma 1949  . Hypertension   . Obesity, unspecified   . Personal history of tobacco use, presenting hazards to health   . Varicose veins of lower extremities with other complications     SURGICAL HISTORY: Past Surgical History:  Procedure Laterality Date  . BREAST BIOPSY Right 1991  . BREAST BIOPSY Left 2013  . COLONOSCOPY  2010   Dr. Vira Agar  . COLONOSCOPY  Jan 2016   Dr Vira Agar  . IVC FILTER INSERTION N/A 11/14/2016   Procedure: IVC Filter Insertion;  Surgeon: Katha Cabal, MD;  Location: [facility];  Service: Cardiovascular;  Laterality: N/A;  . VEIN SURGERY Right 2006   Vein Closure Procedure; RF ablation of right GSV    SOCIAL HISTORY: Social History   Social History  . Marital status: Single    Spouse name: N/A  . Number of children: N/A  . Years of education: N/A   Occupational History  . 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 Smoking status: Former Smoker    Packs/day: 1.00    Years: 20.00  . Smokeless tobacco: Never Used  . Alcohol use No  . Drug use: No  . Sexual activity: Not Currently   Other Topics Concern  . Not on file   Social History Narrative   Independent at baseline. Lives at home with her brother     FAMILY HISTORY: Family History  Problem Relation Age of Onset  . Prostate cancer Father   . Other Sister        mouth cancer  . Hypertension Mother     ALLERGIES:  has No Known Allergies.  MEDICATIONS:  No current facility-administered medications for this visit.    No current outpatient prescriptions on file.   Facility-Administered Medications Ordered in Other Visits  Medication Dose Route Frequency Provider Last Rate Last Dose  . 0.9 %  sodium chloride infusion  250 mL Intravenous PRN Pyreddy, Reatha Harps, MD      . acetaminophen (TYLENOL) tablet 650 mg  650 mg Oral Q6H PRN Pyreddy, Reatha Harps, MD       Or  . acetaminophen (TYLENOL) suppository 650 mg  650 mg Rectal Q6H PRN Pyreddy, Reatha Harps, MD      . ferrous gluconate (FERGON) tablet 324 mg  324 mg Oral Daily Pyreddy, Reatha Harps, MD   324 mg at 11/25/16 1707  . insulin aspart (novoLOG) injection 0-5 Units  0-5 Units Subcutaneous QHS Pyreddy, Pavan, MD      . insulin aspart (novoLOG) injection 0-9 Units  0-9 Units Subcutaneous TID WC Saundra Shelling, MD   3 Units at 11/25/16 1751  . latanoprost (XALATAN) 0.005 % ophthalmic solution 1 drop  1 drop Both Eyes QHS Saundra Shelling, MD  1 drop at 11/25/16 2246  . ondansetron (ZOFRAN) tablet 4 mg  4 mg Oral Q6H PRN Pyreddy, Reatha Harps, MD       Or  . ondansetron (ZOFRAN) injection 4 mg  4 mg Intravenous Q6H PRN Pyreddy, Pavan, MD      . pantoprazole (PROTONIX) injection 40 mg  40 mg Intravenous Q12H Saundra Shelling, MD   40 mg at 11/25/16 2246  . sodium chloride flush (NS) 0.9 % injection 3 mL  3 mL Intravenous Q12H Pyreddy, Reatha Harps, MD   3 mL at 11/25/16 2246  . sodium chloride flush (NS) 0.9 % injection 3 mL  3 mL Intravenous PRN Pyreddy, Pavan, MD      . timolol (TIMOPTIC) 0.25 % ophthalmic solution 1 drop  1 drop Both Eyes BID Saundra Shelling, MD   1 drop at 11/25/16 2246      .  PHYSICAL EXAMINATION: ECOG PERFORMANCE STATUS: {CHL ONC ECOG [phone]}  There were no vitals filed for this  visit. There were no vitals filed for this visit.  GENERAL: Well-nourished well-developed; Alert, no distress and comfortable.   *** EYES: no pallor or icterus OROPHARYNX: no thrush or ulceration; good dentition  NECK: supple, no masses felt LYMPH:  no palpable lymphadenopathy in the cervical, axillary or inguinal regions LUNGS: clear to auscultation and  No wheeze or crackles HEART/CVS: regular rate & rhythm and no murmurs; No lower extremity edema ABDOMEN: abdomen soft, non-tender and normal bowel sounds Musculoskeletal:no cyanosis of digits and no clubbing  PSYCH: alert & oriented x 3 with fluent speech NEURO: no focal motor/sensory deficits SKIN:  no rashes or significant lesions  LABORATORY DATA:  I have reviewed the data as listed Lab Results  Component Value Date   WBC 7.1 11/17/2016   HGB 7.8 (L) 11/26/2016   HCT 25.2 (L) 11/26/2016   MCV 88.0 11/17/2016   PLT 232 11/17/2016    Recent Labs  10/22/16 0928  11/14/16 0824 11/15/16 0409 11/17/16 1707 11/26/16 0115  NA 116*  < > 141 143  --  140  K 4.5  < > 4.0 3.3* 3.8 3.1*  CL 81*  < > 108 110  --  104  CO2 27  < > 25 25  --  29  GLUCOSE 162*  < > 152* 76  --  84  BUN 8  < > 16 11  --  <5*  CREATININE 0.49  < > 0.75 0.67  --  0.63  CALCIUM 8.8*  < > 8.6* 8.2*  --  8.5*  GFRNONAA >60  < > >60 >60  --  >60  GFRAA >60  < > >60 >60  --  >60  PROT 6.8  --   --  5.4*  --   --   ALBUMIN 3.4*  --   --  2.5*  --   --   AST 25  --   --  15  --   --   ALT 17  --   --  9*  --   --   ALKPHOS 45  --   --  44  --   --   BILITOT 0.4  --   --  0.7  --   --   < > = values in this interval not displayed.  RADIOGRAPHIC STUDIES: I have personally reviewed the radiological images as listed and agreed with the findings in the report. Ct Angio Chest Pe W Or Wo Contrast  Result Date: 11/17/2016 CLINICAL DATA:  71 year old female with right lower extremity DVT and anemia with melanotic stools. EXAM: CT ANGIOGRAPHY CHEST WITH  CONTRAST TECHNIQUE: Multidetector CT imaging of the chest was performed using the standard protocol during bolus administration of intravenous contrast. Multiplanar CT image reconstructions and MIPs were obtained to evaluate the vascular anatomy. CONTRAST:  75 mL Isovue 370 COMPARISON:  Chest x-ray 02/03/2008 FINDINGS: Cardiovascular: Excellent opacification of the main pulmonary artery to the proximal segmental level. The subsegmental branches are blurred secondary to respiratory motion artifact. The main pulmonary artery is enlarged at 3.6 cm. Calcifications are present along the course of the coronary arteries. Borderline cardiomegaly with right heart enlargement suggesting elevated right heart pressures. Normal caliber thoracic aorta. No aneurysm or dissection. Atherosclerotic calcifications are present in the aorta. Mediastinum/Nodes: Unremarkable CT appearance of the thyroid gland. High right paratracheal lymph node measures 1.6 cm in short axis (image 24 series 4). AP window lymph node measures 0.9 cm in short axis. Mildly prominent right hilar nodal tissue measures up to 1.6 cm No soft tissue mediastinal mass. The thoracic esophagus is unremarkable. Lungs/Pleura: Lungs are clear. No pleural effusion or pneumothorax. Upper Abdomen: No acute abnormality. Musculoskeletal: No chest wall abnormality. No acute or significant osseous findings. Mild multilevel degenerative endplate changes. Review of the MIP images confirms the above findings. IMPRESSION: 1. Negative for acute pulmonary embolus, pneumonia or other acute cardiopulmonary process. 2. Enlarged pulmonary artery at 3.6 cm. Similar findings can be seen in the setting of pulmonary arterial hypertension. 3. Cardiomegaly with right heart enlargement concerning for elevated right heart pressures and cor pulmonale. 4. Mildly enlarged high right paratracheal lymph node. In isolation, this finding is nonspecific and may reflect reactive adenopathy, chronic  adenopathy related to intermittent CHF or lymphatic congestion, and potentially malignancy including lymphoma and metastatic disease. Recommend repeat CT scan of the chest in 3-6 months. 5. Coronary artery and thoracic aortic atherosclerotic calcifications. Aortic Atherosclerosis (ICD10-I70.0). Electronically Signed   By: Jacqulynn Cadet M.D.   On: 11/17/2016 16:14   US Venous Img Lower Bilateral  Result Date: 11/14/2016 CLINICAL DATA:  71 year old female with right lower extremity swelling for the past 6 days EXAM: BILATERAL LOWER EXTREMITY VENOUS DOPPLER ULTRASOUND TECHNIQUE: Gray-scale sonography with graded compression, as well as color Doppler and duplex ultrasound were performed to evaluate the lower extremity deep venous systems from the level of the common femoral vein and including the common femoral, femoral, profunda femoral, popliteal and calf veins including the posterior tibial, peroneal and gastrocnemius veins when visible. The superficial great saphenous vein was also interrogated. Spectral Doppler was utilized to evaluate flow at rest and with distal augmentation maneuvers in the common femoral, femoral and popliteal veins. COMPARISON:  None. FINDINGS: RIGHT LOWER EXTREMITY Common Femoral Vein: No evidence of thrombus. Normal compressibility, respiratory phasicity and response to augmentation. Saphenofemoral Junction: No evidence of thrombus. Normal compressibility and flow on color Doppler imaging. Profunda Femoral Vein: No evidence of thrombus. Normal compressibility and flow on color Doppler imaging. Femoral Vein: No evidence of thrombus. Normal compressibility, respiratory phasicity and response to augmentation. Popliteal Vein: No evidence of thrombus. Normal compressibility, respiratory phasicity and response to augmentation. Calf Veins: The posterior tibial veins are noncompressible in the lumen serve filled with internal echogenic material consistent with acute thrombus. The peroneal  veins are patent. Superficial Great Saphenous Vein: Noncompressible great saphenous vein with internal echogenic material in the lumen beginning in the mid thigh and extending into the proximal calf. The vessel is dilated. Venous Reflux:  None.  Other Findings:  None. LEFT LOWER EXTREMITY Common Femoral Vein: No evidence of thrombus. Normal compressibility, respiratory phasicity and response to augmentation. Saphenofemoral Junction: No evidence of thrombus. Normal compressibility and flow on color Doppler imaging. Profunda Femoral Vein: No evidence of thrombus. Normal compressibility and flow on color Doppler imaging. Femoral Vein: No evidence of thrombus. Normal compressibility, respiratory phasicity and response to augmentation. Popliteal Vein: No evidence of thrombus. Normal compressibility, respiratory phasicity and response to augmentation. Calf Veins: No evidence of thrombus. Normal compressibility and flow on color Doppler imaging. Superficial Great Saphenous Vein: No evidence of thrombus. Normal compressibility and flow on color Doppler imaging. Venous Reflux:  None. Other Findings: Hypoechoic fluid collection in the popliteal fossa measures 5.4 x 1.0 x 4.1 cm consistent with a Baker cyst. IMPRESSION: 1. Positive for isolated calf deep venous thrombosis within the paired posterior tibial veins of the right calf. No extension into the popliteal vein. 2. Positive for superficial thrombosis of the right great saphenous vein beginning and the mid thigh and extending into the mid calf. 3. No evidence of deep or superficial venous thrombosis in the left lower extremity. 4. Left Baker's cyst. Electronically Signed   By: Jacqulynn Cadet M.D.   On: 11/14/2016 10:23    ASSESSMENT & PLAN:   No problem-specific Assessment & Plan notes found for this encounter.    All questions were answered. The patient knows to call the clinic with any problems, questions or concerns.       Cammie Sickle,  MD 11/26/2016 8:33 AM

## 2016-11-26 NOTE — Progress Notes (Signed)
GI monitor removed and placed at bedside. Barbaraann Faster, RN 11:26 PM 11/26/2016

## 2016-11-26 NOTE — Anesthesia Post-op Follow-up Note (Signed)
Anesthesia QCDR form completed.        

## 2016-11-26 NOTE — Anesthesia Preprocedure Evaluation (Signed)
Anesthesia Evaluation  Patient identified by MRN, date of birth, ID band Patient awake    Reviewed: Allergy & Precautions, NPO status , Patient's Chart, lab work & pertinent test results  Airway Mallampati: III       Dental  (+) Partial Lower, Partial Upper   Pulmonary asthma , COPD,  oxygen dependent, former smoker,           Cardiovascular (-) hypertension (pt denies)+ Peripheral Vascular Disease       Neuro/Psych neg Seizures    GI/Hepatic Neg liver ROS, GERD  Medicated,  Endo/Other  diabetes, Type 2, Insulin Dependent, Oral Hypoglycemic Agents  Renal/GU negative Renal ROS     Musculoskeletal   Abdominal   Peds  Hematology   Anesthesia Other Findings   Reproductive/Obstetrics                             Anesthesia Physical Anesthesia Plan  ASA: III  Anesthesia Plan: General   Post-op Pain Management:    Induction: Intravenous  PONV Risk Score and Plan:   Airway Management Planned: Nasal Cannula  Additional Equipment:   Intra-op Plan:   Post-operative Plan:   Informed Consent: I have reviewed the patients History and Physical, chart, labs and discussed the procedure including the risks, benefits and alternatives for the proposed anesthesia with the patient or authorized representative who has indicated his/her understanding and acceptance.     Plan Discussed with:   Anesthesia Plan Comments:         Anesthesia Quick Evaluation

## 2016-11-26 NOTE — Anesthesia Procedure Notes (Signed)
Date/Time: 11/26/2016 11:01 AM Performed by: Johnna Acosta Pre-anesthesia Checklist: Patient identified, Emergency Drugs available, Suction available, Patient being monitored and Timeout performed Patient Re-evaluated:Patient Re-evaluated prior to induction Oxygen Delivery Method: Nasal cannula

## 2016-11-27 ENCOUNTER — Encounter: Payer: Self-pay | Admitting: Gastroenterology

## 2016-11-27 LAB — GLUCOSE, CAPILLARY
GLUCOSE-CAPILLARY: 152 mg/dL — AB (ref 65–99)
GLUCOSE-CAPILLARY: 186 mg/dL — AB (ref 65–99)
Glucose-Capillary: 182 mg/dL — ABNORMAL HIGH (ref 65–99)
Glucose-Capillary: 191 mg/dL — ABNORMAL HIGH (ref 65–99)

## 2016-11-27 LAB — FOLATE RBC
FOLATE, HEMOLYSATE: 522.4 ng/mL
FOLATE, RBC: 1928 ng/mL (ref >498)
HEMATOCRIT: 27.1 % — AB (ref 34.0–46.6)

## 2016-11-27 LAB — SURGICAL PATHOLOGY

## 2016-11-27 LAB — CBC
HEMATOCRIT: 26.4 % — AB (ref 35.0–47.0)
Hemoglobin: 8.3 g/dL — ABNORMAL LOW (ref 12.0–16.0)
MCH: 26 pg (ref 26.0–34.0)
MCHC: 31.3 g/dL — AB (ref 32.0–36.0)
MCV: 83.1 fL (ref 80.0–100.0)
Platelets: 259 10*3/uL (ref 150–440)
RBC: 3.18 MIL/uL — ABNORMAL LOW (ref 3.80–5.20)
RDW: 16.5 % — AB (ref 11.5–14.5)
WBC: 6 10*3/uL (ref 3.6–11.0)

## 2016-11-27 LAB — HEMOGLOBIN AND HEMATOCRIT, BLOOD
HCT: 26.4 % — ABNORMAL LOW (ref 35.0–47.0)
HCT: 29.8 % — ABNORMAL LOW (ref 35.0–47.0)
HCT: 28.2 % — ABNORMAL LOW (ref 35.0–47.0)
HEMATOCRIT: 27.4 % — AB (ref 35.0–47.0)
HEMATOCRIT: 27.7 % — AB (ref 35.0–47.0)
HEMOGLOBIN: 8.6 g/dL — AB (ref 12.0–16.0)
Hemoglobin: 8.2 g/dL — ABNORMAL LOW (ref 12.0–16.0)
Hemoglobin: 9.1 g/dL — ABNORMAL LOW (ref 12.0–16.0)
Hemoglobin: 8.7 g/dL — ABNORMAL LOW (ref 12.0–16.0)
Hemoglobin: 8.6 g/dL — ABNORMAL LOW (ref 12.0–16.0)

## 2016-11-27 LAB — BASIC METABOLIC PANEL
Anion gap: 5 (ref 5–15)
CALCIUM: 8.5 mg/dL — AB (ref 8.9–10.3)
CO2: 31 mmol/L (ref 22–32)
CREATININE: 0.59 mg/dL (ref 0.44–1.00)
Chloride: 106 mmol/L (ref 101–111)
GFR calc Af Amer: >60 mL/min (ref >60)
GFR calc non Af Amer: >60 mL/min (ref >60)
GLUCOSE: 138 mg/dL — AB (ref 65–99)
Potassium: 3.8 mmol/L (ref 3.5–5.1)
Sodium: 142 mmol/L (ref 135–145)

## 2016-11-27 NOTE — Care Management Important Message (Signed)
Important Message  Patient Details  Name: Susan Barajas MRN: [id number] Date of Birth: 16-Aug-1945   Medicare Important Message Given:  Yes    Beverly Sessions, RN 11/27/2016, 11:12 AM

## 2016-11-27 NOTE — Care Management (Signed)
Patient has chronic O2 through [facility]

## 2016-11-27 NOTE — Progress Notes (Signed)
[facility] [place] NAME: Susan Barajas    MR#:  [id number]  DATE OF BIRTH:  Dec 18, 1945  SUBJECTIVE:  CHIEF COMPLAINT:  No chief complaint on file. Feeling okay, no complaints.  Hemoglobin 9.1 REVIEW OF SYSTEMS:  CONSTITUTIONAL: No fever,positive for fatigue or weakness.  EYES: No blurred or double vision.  EARS, NOSE, AND THROAT: No tinnitus or ear pain.  RESPIRATORY: No cough, shortness of breath, wheezing or hemoptysis.  CARDIOVASCULAR: No chest pain, orthopnea, edema.  GASTROINTESTINAL: No nausea, vomiting, diarrhea or abdominal pain.  GENITOURINARY: No dysuria, hematuria.  ENDOCRINE: No polyuria, nocturia,  HEMATOLOGY: No anemia, easy bruising or bleeding SKIN: No rash or lesion. MUSCULOSKELETAL: No joint pain or arthritis.   NEUROLOGIC: No tingling, numbness, weakness.  PSYCHIATRY: No anxiety or depression.   ROS  DRUG ALLERGIES:  No Known Allergies  VITALS:  Blood pressure (!) 111/52, pulse 72, temperature 98.3 F (36.8 C), temperature source Oral, resp. rate 18, height _0  (1.676 m), weight 102.4 kg (225 lb 11.2 oz), SpO2 99 %. PHYSICAL EXAMINATION:  GENERAL:  71 y.o.-year-old patient lying in the bed with no acute distress.  EYES: Pupils equal, round, reactive to light and accommodation. No scleral icterus. Extraocular muscles intact.  HEENT: Head atraumatic, normocephalic. Oropharynx and nasopharynx clear.  NECK:  Supple, no jugular venous distention. No thyroid enlargement, no tenderness.  LUNGS: Normal breath sounds bilaterally, no wheezing, rales,rhonchi or crepitation. No use of accessory muscles of respiration.  CARDIOVASCULAR: S1, S2 normal. No murmurs, rubs, or gallops.  ABDOMEN: Soft, nontender, nondistended. Bowel sounds present. No organomegaly or mass.  EXTREMITIES: No pedal edema, cyanosis, or clubbing.  NEUROLOGIC: Cranial nerves II through XII are intact. Muscle strength 5/5 in all extremities. Sensation  intact. Gait not checked.  PSYCHIATRIC: The patient is alert and oriented x 3.  SKIN: No obvious rash, lesion, or ulcer.   Physical Exam LABORATORY PANEL:   CBC  Recent Labs Lab 11/27/16 0404 11/27/16 0949  WBC 6.0  --   HGB 8.3*  8.2* 9.1*  HCT 26.4*  26.4* 29.8*  PLT 259  --    ------------------------------------------------------------------------------------------------------------------  Chemistries   Recent Labs Lab 11/26/16 1334 11/27/16 0404  NA  --  142  K  --  3.8  CL  --  106  CO2  --  31  GLUCOSE  --  138*  BUN  --  <5*  CREATININE  --  0.59  CALCIUM  --  8.5*  MG 1.6*  --    ------------------------------------------------------------------------------------------------------------------  ASSESSMENT AND PLAN:   Active Problems:   Anemia  * Symptomatic anemia due to blood loss - Hemoglobin improved and is at 9.1  * Acute gastrointestinal bleeding - s/p 1 unit PRBC   EGD done, no clear source - s/p capsule endoscopy on 8/15 - await results - s/p  IV iron    * Diabetes mellitus type 2- Insulin sliding scale  * Hypertension   Monitor for now.  * Hypokalemia   Hypomagnesemia   Replaced    All the records are reviewed and case discussed with Care Management/Social Workerr. Management plans discussed with the patient, family and they are in agreement.  CODE STATUS: Full.  TOTAL TIME TAKING CARE OF THIS PATIENT: 35 minutes.    POSSIBLE D/C IN 1 DAYS, DEPENDING ON CLINICAL CONDITION.  And GI workup   Max Sane M.D on 11/27/2016   Between 7am to 6pm - Pager - [phone]  After 6pm  go to [email or web address] - password [email or web address] [facility]  Office  [phone]  CC: Primary care physician; Albina Billet, MD  Note: This dictation was prepared with Dragon dictation along with smaller phrase technology. Any transcriptional errors that result from this process are unintentional.

## 2016-11-28 LAB — BASIC METABOLIC PANEL
ANION GAP: 6 (ref 5–15)
BUN: <5 mg/dL — ABNORMAL LOW (ref 6–20)
CHLORIDE: 103 mmol/L (ref 101–111)
CO2: 32 mmol/L (ref 22–32)
Calcium: 8.8 mg/dL — ABNORMAL LOW (ref 8.9–10.3)
Creatinine, Ser: 0.65 mg/dL (ref 0.44–1.00)
GFR calc non Af Amer: >60 mL/min (ref >60)
GLUCOSE: 172 mg/dL — AB (ref 65–99)
POTASSIUM: 4 mmol/L (ref 3.5–5.1)
Sodium: 141 mmol/L (ref 135–145)

## 2016-11-28 LAB — CBC
HEMATOCRIT: 28.4 % — AB (ref 35.0–47.0)
HEMOGLOBIN: 9 g/dL — AB (ref 12.0–16.0)
MCH: 26.1 pg (ref 26.0–34.0)
MCHC: 31.6 g/dL — ABNORMAL LOW (ref 32.0–36.0)
MCV: 82.7 fL (ref 80.0–100.0)
Platelets: 264 10*3/uL (ref 150–440)
RBC: 3.43 MIL/uL — ABNORMAL LOW (ref 3.80–5.20)
RDW: 16 % — AB (ref 11.5–14.5)
WBC: 5.3 10*3/uL (ref 3.6–11.0)

## 2016-11-28 LAB — HEMOGLOBIN AND HEMATOCRIT, BLOOD
HEMATOCRIT: 28 % — AB (ref 35.0–47.0)
Hemoglobin: 8.8 g/dL — ABNORMAL LOW (ref 12.0–16.0)

## 2016-11-28 LAB — GLUCOSE, CAPILLARY
GLUCOSE-CAPILLARY: 174 mg/dL — AB (ref 65–99)
GLUCOSE-CAPILLARY: 167 mg/dL — AB (ref 65–99)

## 2016-11-28 NOTE — Care Management (Addendum)
Patient is currently followed by [facility] for SN and PT.  Have requested order from attending. Notified agency.

## 2016-11-28 NOTE — Progress Notes (Signed)
Pt to be discharged per MD order. IV removed. Instruction reviewed with pt and family whom verbalize understanding of the discharge teaching. No new medications. Pt d/c on oxygen and has own portable tank at the bedside. Will d/c in wheelchair.

## 2016-11-29 NOTE — Discharge Summary (Signed)
[facility] at [place] NAME: Susan Barajas    MR#:  [id number]  DATE OF BIRTH:  [birth year]  DATE OF ADMISSION:  11/25/2016   ADMITTING PHYSICIAN: Saundra Shelling, MD  DATE OF DISCHARGE: 11/28/2016  3:03 PM  PRIMARY CARE PHYSICIAN: Albina Billet, MD   ADMISSION DIAGNOSIS:  Symptomatic anemia GI Bleed anemia, melena DISCHARGE DIAGNOSIS:  Active Problems:   Anemia  SECONDARY DIAGNOSIS:   Past Medical History:  Diagnosis Date  . Asthma 1950  . Diabetes ([facility]) Age 5   Type 2; insulin dependent  . Glaucoma 1949  . Hypertension   . Obesity, unspecified   . Personal history of tobacco use, presenting hazards to health   . Varicose veins of lower extremities with other complications    HOSPITAL COURSE:  71 y.o. female with a known history of bronchial asthma, type 2 diabetes mellitus admitted after she was referred from the gastroenterology clinic. Patient was referred for shortness of breath and weakness and fatigue. She was supposed to have EGD and colonoscopy at the endoscopy Center outpatient. Patient had melanotic stool. Her hemoglobin was low when it was checked at the clinic was around 7.6. She was referred to the hospitalist service for blood transfusion and further workup.   * Symptomatic anemia due to blood loss - Hemoglobin improved and is at 8.8  * Acute gastrointestinal bleeding - s/p 1 unit PRBC   EGD done, no clear source - s/p capsule endoscopy on 8/15 - outpt GI f/up - s/p  IV iron    * Hypokalemia   Hypomagnesemia   Replaced  DISCHARGE CONDITIONS:  stable CONSULTS OBTAINED:  Treatment Team:  Lin Landsman, MD DRUG ALLERGIES:  No Known Allergies DISCHARGE MEDICATIONS:   Allergies as of 11/28/2016   No Known Allergies     Medication List    TAKE these medications   acetaminophen 325 MG tablet Commonly known as:  TYLENOL Take 2 tablets (650 mg total) by mouth every 6 (six) hours as needed for mild  pain (or Fever >/= 101).   aspirin 81 MG chewable tablet Chew 1 tablet (81 mg total) by mouth daily.   ferrous gluconate 325 MG tablet Commonly known as:  FERGON Take 325 mg by mouth daily.   furosemide 20 MG tablet Commonly known as:  LASIX Take 1 tablet (20 mg total) by mouth daily.   HUMALOG KWIKPEN 100 UNIT/ML KiwkPen Generic drug:  insulin lispro Inject 10 Units into the skin 2 (two) times daily.   insulin glargine 100 UNIT/ML injection Commonly known as:  LANTUS Inject 32 Units into the skin daily.   latanoprost 0.005 % ophthalmic solution Commonly known as:  XALATAN 1 drop at bedtime.   lovastatin 20 MG tablet Commonly known as:  MEVACOR Take 20 mg by mouth at bedtime.   metFORMIN 1000 MG tablet Commonly known as:  GLUCOPHAGE Take 1,000 mg by mouth daily.   multivitamin tablet Take 1 tablet by mouth daily.   omeprazole 40 MG capsule Commonly known as:  PRILOSEC Take 1 capsule (40 mg total) by mouth daily before breakfast.   pioglitazone 30 MG tablet Commonly known as:  ACTOS Take 15 mg by mouth daily.   sodium chloride 1 g tablet Take 1 tablet (1 g total) by mouth 2 (two) times daily with a meal.   timolol 0.25 % ophthalmic solution Commonly known as:  TIMOPTIC Place 1 drop into both eyes 2 (two) times daily.  DISCHARGE INSTRUCTIONS:   DIET:  Cardiac diet DISCHARGE CONDITION:  Good ACTIVITY:  Activity as tolerated OXYGEN:  Home Oxygen: No.  Oxygen Delivery: room air DISCHARGE LOCATION:  home with home health - [facility] for SN and PT  If you experience worsening of your admission symptoms, develop shortness of breath, life threatening emergency, suicidal or homicidal thoughts you must seek medical attention immediately by calling 911 or calling your MD immediately  if symptoms less severe.  You Must read complete instructions/literature along with all the possible adverse reactions/side effects for all the  Medicines you take and that have been prescribed to you. Take any new Medicines after you have completely understood and accpet all the possible adverse reactions/side effects.   Please note  You were cared for by a hospitalist during your hospital stay. If you have any questions about your discharge medications or the care you received while you were in the hospital after you are discharged, you can call the unit and asked to speak with the hospitalist on call if the hospitalist that took care of you is not available. Once you are discharged, your primary care physician will handle any further medical issues. Please note that NO REFILLS for any discharge medications will be authorized once you are discharged, as it is imperative that you return to your primary care physician (or establish a relationship with a primary care physician if you do not have one) for your aftercare needs so that they can reassess your need for medications and monitor your lab values.    On the day of Discharge:  VITAL SIGNS:  Blood pressure 129/63, pulse 89, temperature 98.4 F (36.9 C), temperature source Oral, resp. rate 16, height _0  (1.676 m), weight 102.4 kg (225 lb 11.2 oz), SpO2 95 %. PHYSICAL EXAMINATION:  GENERAL:  71 y.o.-year-old patient lying in the bed with no acute distress.  EYES: Pupils equal, round, reactive to light and accommodation. No scleral icterus. Extraocular muscles intact.  HEENT: Head atraumatic, normocephalic. Oropharynx and nasopharynx clear.  NECK:  Supple, no jugular venous distention. No thyroid enlargement, no tenderness.  LUNGS: Normal breath sounds bilaterally, no wheezing, rales,rhonchi or crepitation. No use of accessory muscles of respiration.  CARDIOVASCULAR: S1, S2 normal. No murmurs, rubs, or gallops.  ABDOMEN: Soft, non-tender, non-distended. Bowel sounds present. No organomegaly or mass.  EXTREMITIES: No pedal edema, cyanosis, or clubbing.  NEUROLOGIC: Cranial nerves II  through XII are intact. Muscle strength 5/5 in all extremities. Sensation intact. Gait not checked.  PSYCHIATRIC: The patient is alert and oriented x 3.  SKIN: No obvious rash, lesion, or ulcer.  DATA REVIEW:   CBC  Recent Labs Lab 11/28/16 0407 11/28/16 1030  WBC 5.3  --   HGB 9.0* 8.8*  HCT 28.4* 28.0*  PLT 264  --     Chemistries   Recent Labs Lab 11/26/16 1334  11/28/16 0407  NA  --   < > 141  K  --   < > 4.0  CL  --   < > 103  CO2  --   < > 32  GLUCOSE  --   < > 172*  BUN  --   < > <5*  CREATININE  --   < > 0.65  CALCIUM  --   < > 8.8*  MG 1.6*  --   --   < > = values in this interval not displayed.   Follow-up Information    Albina Billet,  MD. Daphane Shepherd on 12/09/2016.   Specialty:  Internal Medicine Why:  _0 :45P Contact information: [address] [place] [address] [phone]        Manya Silvas, MD. Schedule an appointment as soon as possible for a visit in 2 week(s).   Specialty:  Gastroenterology Why:  OFFICE WILL CALL YOU WITH APPOINTMENT Contact information: [place] [address] [place], [place] Follow up.   Specialty:  [place] Why:  Home Health nurse and physical therapy Contact information: [place] [address] [phone]           High risk for readmissions  Management plans discussed with the patient, family and they are in agreement.  CODE STATUS: Prior   TOTAL TIME TAKING CARE OF THIS PATIENT: 45 minutes.    Max Sane M.D on 11/29/2016 at 11:43 PM  Between 7am to 6pm - Pager - [phone]  After 6pm go to [email or web address] - Proofreader  [facility]  Office  [phone]  CC: Primary care physician; Albina Billet, MD   Note: This dictation was prepared with Dragon dictation along with smaller phrase technology. Any transcriptional errors that result from this process  are unintentional.

## 2016-12-01 NOTE — Progress Notes (Signed)
Just noted positive  HELICOBACTER PYLORI in stomach patho report through coding query.  I've called in triple Abx therapy (clarithro +PPI + Amoxy) for 14 days and notified patient as well on her home phone.  She is agreeable to start treatment.

## 2016-12-02 ENCOUNTER — Ambulatory Visit (INDEPENDENT_AMBULATORY_CARE_PROVIDER_SITE_OTHER): Payer: Medicare Other | Admitting: Cardiovascular Disease

## 2016-12-02 ENCOUNTER — Encounter: Payer: Self-pay | Admitting: Cardiovascular Disease

## 2016-12-02 VITALS — BP 129/73 | HR 90 | Ht 66.0 in | Wt 175.2 lb

## 2016-12-02 DIAGNOSIS — Z7689 Persons encountering health services in other specified circumstances: Secondary | ICD-10-CM | POA: Diagnosis not present

## 2016-12-02 DIAGNOSIS — I5032 Chronic diastolic (congestive) heart failure: Secondary | ICD-10-CM | POA: Diagnosis not present

## 2016-12-02 DIAGNOSIS — G4730 Sleep apnea, unspecified: Secondary | ICD-10-CM | POA: Diagnosis not present

## 2016-12-02 NOTE — Patient Instructions (Signed)
Medication Instructions:  Your physician recommends that you continue on your current medications as directed. Please refer to the Current Medication list given to you today.   Labwork: none  Testing/Procedures: none  Follow-Up: Your physician recommends that you schedule a follow-up appointment in: two months with Dr. Fletcher Anon.    Any Other Special Instructions Will Be Listed Below (If Applicable). Referral to Dr. Mortimer Fries, Pulmonology      If you need a refill on your cardiac medications before your next appointment, please call your pharmacy.

## 2016-12-02 NOTE — Progress Notes (Signed)
Cardiology Office Note   Date:  12/02/2016   ID:  Susan Barajas, DOB 10/29/1945, MRN [id number]  PCP:  Albina Billet, MD  Cardiologist:   Kathlyn Sacramento, MD   Chief Complaint  Patient presents with  . OTHER    F/u hospital DVT C/o swelling. Meds reviewed verbally with pt.      History of Present Illness: Susan Barajas is a 71 y.o. female who Was referred by hospitalist for evaluation of diastolic heart failure and pulmonary hypertension. The patient has extensive medical problems that include diabetes mellitus for at least 40 years, hyperlipidemia and obesity. She had multiple recent hospitalizations. The first one was for hyponatremia which was thought to be due to hydrochlorothiazide which was discontinued. She was then hospitalized with right leg DVT but was noted to have worsening anemia and thus could not be anticoagulated. She had an IVC filter placed. Most recent hospitalization was due to worsening anemia requiring transfusion. She had GI workup with no obvious source but she is still going to follow-up with GI. During these recent hospitalizations, she was noted to be hypoxic requiring oxygen. She was seen by pulmonary with concerns about possible diastolic heart failure and sleep apnea. She had an echocardiogram done which showed normal LV systolic function, grade 1 diastolic dysfunction, mildly dilated right ventricle with moderate to severe pulmonary hypertension. Peak pulmonary pressure was 65 mmHg. The patient was placed on small dose Lasix 20 mg daily. She continues to use oxygen and she has not had a sleep study done yet.    Past Medical History:  Diagnosis Date  . Asthma 1950  . Clotting disorder ([facility])    right leg  . Diabetes ([facility]) Age 49   Type 2; insulin dependent  . Glaucoma 1949  . Hyperlipidemia   . Hypertension   . Obesity, unspecified   . Personal history of tobacco use, presenting hazards to health   . Varicose veins of lower extremities  with other complications     Past Surgical History:  Procedure Laterality Date  . BREAST BIOPSY Right 1991  . BREAST BIOPSY Left 2013  . COLONOSCOPY  2010   Dr. Vira Agar  . COLONOSCOPY  Jan 2016   Dr Vira Agar  . ESOPHAGOGASTRODUODENOSCOPY N/A 11/26/2016   Procedure: ESOPHAGOGASTRODUODENOSCOPY (EGD);  Surgeon: Lin Landsman, MD;  Location: [facility] ENDOSCOPY;  Service: Gastroenterology;  Laterality: N/A;  . GIVENS CAPSULE STUDY N/A 11/26/2016   Procedure: GIVENS CAPSULE STUDY, if EGD negative, plan to drop capsule with scope if EGD negative;  Surgeon: Lin Landsman, MD;  Location: [facility] ENDOSCOPY;  Service: Gastroenterology;  Laterality: N/A;  . IVC FILTER INSERTION N/A 11/14/2016   Procedure: IVC Filter Insertion;  Surgeon: Katha Cabal, MD;  Location: [facility] CV LAB;  Service: Cardiovascular;  Laterality: N/A;  . VEIN SURGERY Right 2006   Vein Closure Procedure; RF ablation of right GSV     Current Outpatient Prescriptions  Medication Sig Dispense Refill  . acetaminophen (TYLENOL) 325 MG tablet Take 2 tablets (650 mg total) by mouth every 6 (six) hours as needed for mild pain (or Fever >/= 101).    Marland Kitchen aspirin 81 MG chewable tablet Chew 1 tablet (81 mg total) by mouth daily. 30 tablet 0  . ferrous gluconate (FERGON) 325 MG tablet Take 325 mg by mouth daily.    . furosemide (LASIX) 20 MG tablet Take 1 tablet (20 mg total) by mouth daily. 30 tablet 0  . HUMALOG KWIKPEN 100  UNIT/ML KiwkPen Inject 10 Units into the skin 2 (two) times daily.     . insulin glargine (LANTUS) 100 UNIT/ML injection Inject 32 Units into the skin daily.     Marland Kitchen latanoprost (XALATAN) 0.005 % ophthalmic solution 1 drop at bedtime.    . lovastatin (MEVACOR) 20 MG tablet Take 20 mg by mouth at bedtime.    . metFORMIN (GLUCOPHAGE) 1000 MG tablet Take 1,000 mg by mouth daily.     . Multiple Vitamin (MULTIVITAMIN) tablet Take 1 tablet by mouth daily.    Marland Kitchen omeprazole (PRILOSEC) 40 MG capsule Take 1 capsule  (40 mg total) by mouth daily before breakfast. 30 capsule 0  . pioglitazone (ACTOS) 30 MG tablet Take 15 mg by mouth daily.     . timolol (TIMOPTIC) 0.25 % ophthalmic solution Place 1 drop into both eyes 2 (two) times daily.      No current facility-administered medications for this visit.     Allergies:   Patient has no known allergies.    Social History:  The patient  reports that she has quit smoking. She has a 20.00 pack-year smoking history. She has never used smokeless tobacco. She reports that she does not drink alcohol or use drugs.   Family History:  The patient's family history includes Hypertension in her mother; Other in her sister; Prostate cancer in her father.    ROS:  Please see the history of present illness.   Otherwise, review of systems are positive for none.   All other systems are reviewed and negative.    PHYSICAL EXAM: VS:  BP 129/73 (BP Location: Right Arm, Patient Position: Sitting, Cuff Size: Large)   Pulse 90   Ht _0  (1.676 m)   Wt 175 lb 4 oz (79.5 kg)   BMI 28.29 kg/m  , BMI Body mass index is 28.29 kg/m. GEN: Well nourished, well developed, in no acute distress  HEENT: normal  Neck: no  carotid bruits, or masses. Mild JVD Cardiac: RRR; no murmurs, rubs, or gallops, S1 edema  Respiratory:  clear to auscultation bilaterally, normal work of breathing GI: soft, nontender, nondistended, + BS MS: no deformity or atrophy  Skin: warm and dry, no rash Neuro:  Strength and sensation are intact Psych: euthymic mood, full affect   EKG:  EKG is ordered today. The ekg ordered today demonstrates normal sinus rhythm with low voltage and nonspecific T wave changes.   Recent Labs: 11/15/2016: ALT 9; TSH 2.080 11/26/2016: Magnesium 1.6 11/28/2016: BUN <5; Creatinine, Ser 0.65; Hemoglobin 8.8; Platelets 264; Potassium 4.0; Sodium 141    Lipid Panel    Component Value Date/Time   CHOL 104 10/22/2016 1814   TRIG 47 10/22/2016 1814   HDL 47 10/22/2016 1814     CHOLHDL 2.2 10/22/2016 1814   VLDL 9 10/22/2016 1814   LDLCALC 48 10/22/2016 1814      Wt Readings from Last 3 Encounters:  12/02/16 175 lb 4 oz (79.5 kg)  11/25/16 225 lb 11.2 oz (102.4 kg)  11/14/16 213 lb 12.8 oz (97 kg)        PAD Screen 12/02/2016  Previous PAD dx? No  Previous surgical procedure? Yes  Dates of procedures Right leg stent  Pain with walking? No  Feet/toe relief with dangling? No  Painful, non-healing ulcers? No  Extremities discolored? No      ASSESSMENT AND PLAN:  1.  Chronic diastolic heart failure: Recent echocardiogram showed moderate to severe pulmonary hypertension. This seems to be out  of proportion to degree of diastolic dysfunction. Ejection fraction was normal. I also suspect that the pulmonary pressure was exaggerated in the setting of anemia and acute illness. She appears to be mildly volume overloaded but according to our scale, her weight is significantly down. Not sure if accurate. I do think she should be evaluated for sleep apnea and I'm referring her back to Dr. Mortimer Fries who saw her while hospitalized. I am planning to repeat echocardiogram in few months once her anemia is improved. Pulmonary pressure remains elevated, she will require at least a right heart catheterization which is better to do after her anemia is improved. The patient also has prolonged history of diabetes and she should be evaluated for ischemic heart disease. I am planning to do a pharmacologic nuclear stress test upon follow-up.  2. Recent blood loss anemia: I encouraged her to keep follow-up with GI.  3. Right leg DVT status post IVC filter given that she was not a good candidate for anticoagulation.    Disposition:   FU with me in 2 months  Signed,  Kathlyn Sacramento, MD  12/02/2016 3:43 PM    [place]

## 2016-12-24 ENCOUNTER — Ambulatory Visit (INDEPENDENT_AMBULATORY_CARE_PROVIDER_SITE_OTHER): Payer: Medicare Other | Admitting: Internal Medicine

## 2016-12-24 ENCOUNTER — Encounter: Payer: Self-pay | Admitting: Internal Medicine

## 2016-12-24 VITALS — BP 132/72 | HR 91 | Resp 16 | Ht 66.0 in | Wt 209.0 lb

## 2016-12-24 DIAGNOSIS — G4719 Other hypersomnia: Secondary | ICD-10-CM

## 2016-12-24 NOTE — Patient Instructions (Addendum)
Please obtain sleep apnea as soon as possible Continue oxygen as prescribed Follow up with cardiology

## 2016-12-24 NOTE — Progress Notes (Signed)
Name: Susan Barajas MRN: [id number] DOB: Mar 29, 1946     CONSULTATION DATE: 12/24/2016   REFERRING MD :  Dr. Rockey Situ  CHIEF COMPLAINT: excessive daytime sleepiness  STUDIES:  Ct chest 11/17/2016 I have Independently reviewed images of  CT chest   on 12/24/2016 Interpretation: No evidence of PE, possible little enlarged pulmonary artery, right peritracheal mild reactive adenopathy Probable underlying interstitial edema  Ultrasound on 11/14/16 + Right leg DVT   HISTORY OF PRESENT ILLNESS:     71 y.o. female evaluation of diastolic heart failure and pulmonary hypertension and assess loud snoring and excessive daytime sleepiness/fatigue The patient has extensive medical problems that include diabetes mellitus for at least 40 years, hyperlipidemia and obesity.  She had multiple recent hospitalizations.  -The first one was for hyponatremia which was thought to be due to hydrochlorothiazide which was discontinued.  -She was then hospitalized with right leg DVT but was noted to have worsening anemia and thus could not be anticoagulated. She had an IVC filter placed.  -Most recent hospitalization was due to worsening anemia requiring transfusion. She had GI workup with no obvious source but she is still going to follow-up with GI. During these recent hospitalizations, she was noted to be hypoxic requiring oxygen.  -She had an echocardiogram done which showed normal LV systolic function, grade 1 diastolic dysfunction, mildly dilated right ventricle with moderate to severe pulmonary hypertension. Peak pulmonary pressure was 65 mmHg. The patient was placed on small dose Lasix 20 mg daily. She continues to use oxygen    Patient has been having excessive daytime sleepiness Patient has been having extreme fatigue and tiredness, lack of energy +  very Loud snoring every night +oxygen at night  PAST MEDICAL HISTORY :   has a past medical history of Asthma (1950); Clotting disorder ([place]);  Diabetes ([place]) (Age 38); Glaucoma (1949); Hyperlipidemia; Hypertension; Obesity, unspecified; Personal history of tobacco use, presenting hazards to health; and Varicose veins of lower extremities with other complications.  has a past surgical history that includes Vein Surgery (Right, 2006); Colonoscopy (2010); Breast biopsy (Right, 1991); Breast biopsy (Left, 2013); Colonoscopy (Jan 2016); IVC Filter Insertion (N/A, 11/14/2016); Esophagogastroduodenoscopy (N/A, 11/26/2016); and Givens capsule study (N/A, 11/26/2016). Prior to Admission medications   Medication Sig Start Date End Date Taking? Authorizing Provider  acetaminophen (TYLENOL) 325 MG tablet Take 2 tablets (650 mg total) by mouth every 6 (six) hours as needed for mild pain (or Fever >/= 101). 11/17/16   Gouru, Aruna, MD  aspirin 81 MG chewable tablet Chew 1 tablet (81 mg total) by mouth daily. 10/26/16   Loletha Grayer, MD  ferrous gluconate (FERGON) 325 MG tablet Take 325 mg by mouth daily.    [provider]  furosemide (LASIX) 20 MG tablet Take 1 tablet (20 mg total) by mouth daily. 10/26/16   Loletha Grayer, MD  HUMALOG KWIKPEN 100 UNIT/ML KiwkPen Inject 10 Units into the skin 2 (two) times daily.  07/03/15   [provider]  insulin glargine (LANTUS) 100 UNIT/ML injection Inject 32 Units into the skin daily.     [provider]  latanoprost (XALATAN) 0.005 % ophthalmic solution 1 drop at bedtime.    [provider]  lovastatin (MEVACOR) 20 MG tablet Take 20 mg by mouth at bedtime.    [provider]  metFORMIN (GLUCOPHAGE) 1000 MG tablet Take 1,000 mg by mouth daily.     [provider]  Multiple Vitamin (MULTIVITAMIN) tablet Take 1 tablet by mouth daily.  [provider]  omeprazole (PRILOSEC) 40 MG capsule Take 1 capsule (40 mg total) by mouth daily before breakfast. 11/17/16   Gouru, Aruna, MD  pioglitazone (ACTOS) 30 MG tablet Take 15 mg by mouth daily.     [provider]  timolol (TIMOPTIC) 0.25 % ophthalmic solution Place 1 drop into both eyes 2 (two) times daily.  01/13/14   [provider]   No Known Allergies  FAMILY HISTORY:  family history includes Hypertension in her mother; Other in her sister; Prostate cancer in her father. SOCIAL HISTORY:  reports that she has quit smoking. She has a 20.00 pack-year smoking history. She has never used smokeless tobacco. She reports that she does not drink alcohol or use drugs.  REVIEW OF SYSTEMS:   Constitutional: Negative for fever, chills, weight loss, malaise/fatigue and diaphoresis.  HENT: Negative for hearing loss, ear pain, nosebleeds, congestion, sore throat, neck pain, tinnitus and ear discharge.   Eyes: Negative for blurred vision, double vision, photophobia, pain, discharge and redness.  Respiratory: Negative for cough, hemoptysis, sputum production, +shortness of breath, -wheezing and stridor.   Cardiovascular: Negative for chest pain, palpitations, orthopnea, claudication, +leg swelling and PND.  Gastrointestinal: Negative for heartburn, nausea, vomiting, abdominal pain, diarrhea, constipation, blood in stool and melena.  Genitourinary: Negative for dysuria, urgency, frequency, hematuria and flank pain.  Musculoskeletal: Negative for myalgias, back pain, +joint pain and falls.  Skin: Negative for itching and rash.  Neurological: Negative for dizziness, tingling, tremors, sensory change, speech change, focal weakness, seizures, loss of consciousness, weakness and headaches.  Endo/Heme/Allergies: Negative for environmental allergies and polydipsia. Does not bruise/bleed easily.  ALL OTHER ROS ARE NEGATIVE    VITAL SIGNS: BP 132/72 (BP Location: Left Arm, Cuff Size: Normal)   Pulse 91   Resp 16   Ht _0  (1.676 m)   Wt 209 lb (94.8 kg)   SpO2 (!) 2%   BMI 33.73 kg/m    Physical Examination:   GENERAL:NAD, no fevers, chills, + fatigue HEAD: Normocephalic, atraumatic.    EYES: Pupils equal, round, reactive to light. Extraocular muscles intact. No scleral icterus.  MOUTH: Moist mucosal membrane.   EAR, NOSE, THROAT: Clear without exudates. No external lesions.  NECK: Supple. No thyromegaly. No nodules. No JVD.  PULMONARY:CTA B/L no wheezes, no crackles, no rhonchi CARDIOVASCULAR: S1 and S2. Regular rate and rhythm. No murmurs, rubs, or gallops. No edema.  GASTROINTESTINAL: Soft, nontender, nondistended. No masses. Positive bowel sounds.  MUSCULOSKELETAL: +edema. Range of motion full in all extremities.  NEUROLOGIC: Cranial nerves II through XII are intact. No gross focal neurological deficits.  SKIN: No ulceration, lesions, rashes, or cyanosis. Skin warm and dry. Turgor intact.  PSYCHIATRIC: Mood, affect within normal limits. The patient is awake, alert and oriented x 3. Insight, judgment intact.     ASSESSMENT / PLAN: 71 yo AAF with diastolic heart failure with elevated Pulmonary artery with chronic hypoxic resp failure with probable underlying sleep apnea in the setting of deconditioned state   #1 excessive daytime sleepiness with snoring and hypoxic respiratory failure will need to rule out obstructive sleep apnea -Will order sleep study for further evaluation and treatment  #2 chronic hypoxic respiratory failure on oxygen at this time continue as prescribed  #3 diastolic heart failure follow-up with cardiology as recommended Lasix as tolerated  #4 elevated pulmonary artery pressures most likely related to diastolic heart failure with  secondary pulmonary hypertension We'll also need to rule out underlying sleep apnea as a  cause of elevated PA pressures and diastolic heart failure  #6WFUXNAT -recommend significant weight loss -recommend changing diet  #6Deconditioned state -Recommend increased daily activity and exercise     Patient atisfied with Plan of action and management. All questions answered Follow-up after tests completed  Corrin Parker, M.D.  [facility]  Medical Director [place] [facility]

## 2017-01-14 ENCOUNTER — Ambulatory Visit: Payer: Medicare Other | Attending: Internal Medicine

## 2017-01-14 DIAGNOSIS — G4761 Periodic limb movement disorder: Secondary | ICD-10-CM | POA: Diagnosis not present

## 2017-01-14 DIAGNOSIS — G4733 Obstructive sleep apnea (adult) (pediatric): Secondary | ICD-10-CM | POA: Insufficient documentation

## 2017-01-14 DIAGNOSIS — G4710 Hypersomnia, unspecified: Secondary | ICD-10-CM | POA: Diagnosis present

## 2017-01-14 DIAGNOSIS — I5032 Chronic diastolic (congestive) heart failure: Secondary | ICD-10-CM | POA: Insufficient documentation

## 2017-01-26 ENCOUNTER — Telehealth: Payer: Self-pay | Admitting: *Deleted

## 2017-01-26 DIAGNOSIS — R0602 Shortness of breath: Secondary | ICD-10-CM

## 2017-01-26 DIAGNOSIS — G4733 Obstructive sleep apnea (adult) (pediatric): Secondary | ICD-10-CM

## 2017-01-26 NOTE — Telephone Encounter (Signed)
Left message with family member to call office back.

## 2017-01-26 NOTE — Telephone Encounter (Signed)
Follow Up:; ° ° °Returning your call. °

## 2017-01-26 NOTE — Telephone Encounter (Signed)
Patient aware of sleep study. Bipap has been ordered. Susan Barajas has been ordered.

## 2017-01-26 NOTE — Telephone Encounter (Signed)
-----  Message from Laverle Hobby, MD sent at 01/23/2017  1:52 PM EDT ----- Regarding: sleep study results Auto BiPAP could be initiated in the home using the settings of  minimum expiratory pressure of 13, maximum expiratory pressure 18, pressure support of 5..  The patient was fitted with a   FP SImplus FF, small   in the lab.  Heated humidity was required to improve tolerance and reduce oral breathing.         --Recommend 2L to be used with PAP therapy, if this is not covered by insurance, then patient may require overnight oxymetry while on PAP to qualify.

## 2017-02-02 ENCOUNTER — Ambulatory Visit (INDEPENDENT_AMBULATORY_CARE_PROVIDER_SITE_OTHER): Payer: Medicare Other | Admitting: Cardiovascular Disease

## 2017-02-02 ENCOUNTER — Encounter: Payer: Self-pay | Admitting: Cardiovascular Disease

## 2017-02-02 VITALS — BP 158/64 | HR 96 | Ht 66.0 in | Wt 212.5 lb

## 2017-02-02 DIAGNOSIS — I5032 Chronic diastolic (congestive) heart failure: Secondary | ICD-10-CM

## 2017-02-02 DIAGNOSIS — R0602 Shortness of breath: Secondary | ICD-10-CM

## 2017-02-02 DIAGNOSIS — I272 Pulmonary hypertension, unspecified: Secondary | ICD-10-CM | POA: Diagnosis not present

## 2017-02-02 NOTE — Patient Instructions (Addendum)
Medication Instructions:  Your physician has recommended you make the following change in your medication:  STOP taking salt tablets   Labwork: none  Testing/Procedures: Your physician has requested that you have an echocardiogram in two months. Echocardiography is a painless test that uses sound waves to create images of your heart. It provides your doctor with information about the size and shape of your heart and how well your heart's chambers and valves are working. This procedure takes approximately one hour. There are no restrictions for this procedure.    Follow-Up: Your physician recommends that you schedule a follow-up appointment in: two months with Dr. Fletcher Anon.    Any Other Special Instructions Will Be Listed Below (If Applicable).     If you need a refill on your cardiac medications before your next appointment, please call your pharmacy.  Echocardiogram An echocardiogram, or echocardiography, uses sound waves (ultrasound) to produce an image of your heart. The echocardiogram is simple, painless, obtained within a short period of time, and offers valuable information to your health care provider. The images from an echocardiogram can provide information such as:  Evidence of coronary artery disease (CAD).  Heart size.  Heart muscle function.  Heart valve function.  Aneurysm detection.  Evidence of a past heart attack.  Fluid buildup around the heart.  Heart muscle thickening.  Assess heart valve function.  Tell a health care provider about:  Any allergies you have.  All medicines you are taking, including vitamins, herbs, eye drops, creams, and over-the-counter medicines.  Any problems you or family members have had with anesthetic medicines.  Any blood disorders you have.  Any surgeries you have had.  Any medical conditions you have.  Whether you are pregnant or may be pregnant. What happens before the procedure? No special preparation is  needed. Eat and drink normally. What happens during the procedure?  In order to produce an image of your heart, gel will be applied to your chest and a wand-like tool (transducer) will be moved over your chest. The gel will help transmit the sound waves from the transducer. The sound waves will harmlessly bounce off your heart to allow the heart images to be captured in real-time motion. These images will then be recorded.  You may need an IV to receive a medicine that improves the quality of the pictures. What happens after the procedure? You may return to your normal schedule including diet, activities, and medicines, unless your health care provider tells you otherwise. This information is not intended to replace advice given to you by your health care provider. Make sure you discuss any questions you have with your health care provider. Document Released: 03/28/2000 Document Revised: 11/17/2015 Document Reviewed: 12/06/2012 Elsevier Interactive Patient Education  2017 [employer].

## 2017-02-02 NOTE — Progress Notes (Signed)
Cardiology Office Note   Date:  02/02/2017   ID:  Susan Barajas, DOB 1945/10/29, MRN [id number]  PCP:  Albina Billet, MD  Cardiologist:   Kathlyn Sacramento, MD   Chief Complaint  Patient presents with  . other    2 month follow up. Meds reviewed by the pt. verbally. Pt. c/o LE edema.       History of Present Illness: Susan Barajas is a 71 y.o. 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 The patient has extensive medical problems that include diabetes mellitus for at least 40 years, hyperlipidemia and obesity. She had multiple hospitalizations in 2018. The first one was for hyponatremia which was thought to be due to hydrochlorothiazide which was discontinued. She was then hospitalized with right leg DVT but was noted to have worsening anemia and thus could not be anticoagulated. She had an IVC filter placed. Most recent hospitalization was due to worsening anemia requiring transfusion. She had GI workup with no obvious source but she is still going to follow-up with GI. 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 Peak pulmonary pressure was 65 mmHg. The patient was placed on small dose Lasix 20 mg daily. She continues to wear oxygen.  She had outpatient sleep study which confirmed sleep apnea but she is still not using CPAP.  She reports stable exertional dyspnea with no chest pain. She reports worsening bilateral leg edema but she is still taking salt tablets.    Past Medical History:  Diagnosis Date  . Asthma 1950  . Clotting disorder (HCC)    right leg  . 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 Glaucoma 1949  . Hyperlipidemia   . Hypertension   . Obesity, unspecified   . Personal history of tobacco use, presenting hazards to health   .  Varicose veins of lower extremities with other complications     Past Surgical History:  Procedure Laterality Date  . BREAST BIOPSY Right 1991  . BREAST BIOPSY Left 2013  . COLONOSCOPY  2010   Dr. Vira Agar  . COLONOSCOPY  Jan 2016   Dr Vira Agar  . ESOPHAGOGASTRODUODENOSCOPY N/A 11/26/2016   Procedure: ESOPHAGOGASTRODUODENOSCOPY (EGD);  Surgeon: Lin Landsman, MD;  Location: [facility] ENDOSCOPY;  Service: Gastroenterology;  Laterality: N/A;  . GIVENS CAPSULE STUDY N/A 11/26/2016   Procedure: GIVENS CAPSULE STUDY, if EGD negative, plan to drop capsule with scope if EGD negative;  Surgeon: Lin Landsman, MD;  Location: [facility] ENDOSCOPY;  Service: Gastroenterology;  Laterality: N/A;  . IVC FILTER INSERTION N/A 11/14/2016   Procedure: IVC Filter Insertion;  Surgeon: Katha Cabal, MD;  Location: [place] CV LAB;  Service: Cardiovascular;  Laterality: N/A;  . VEIN SURGERY Right 2006   Vein Closure Procedure; RF ablation of right GSV     Current Outpatient Prescriptions  Medication Sig Dispense Refill  . acetaminophen (TYLENOL) 325 MG tablet Take 2 tablets (650 mg total) by mouth every 6 (six) hours as needed for mild pain (or Fever >/= 101).    Marland Kitchen aspirin 81 MG chewable tablet Chew 1 tablet (81 mg total) by mouth daily. 30 tablet 0  . ferrous gluconate (FERGON) 325 MG tablet Take 325 mg by mouth daily.    . furosemide (LASIX) 20 MG tablet Take 1 tablet (20 mg total)  by mouth daily. 30 tablet 0  . HUMALOG KWIKPEN 100 UNIT/ML KiwkPen Inject 10 Units into the skin 2 (two) times daily.     . insulin glargine (LANTUS) 100 UNIT/ML injection Inject 32 Units into the skin daily.     Marland Kitchen latanoprost (XALATAN) 0.005 % ophthalmic solution 1 drop at bedtime.    . lovastatin (MEVACOR) 20 MG tablet Take 20 mg by mouth at bedtime.    . metFORMIN (GLUCOPHAGE) 1000 MG tablet Take 1,000 mg by mouth daily.     . Multiple Vitamin (MULTIVITAMIN) tablet Take 1 tablet by mouth daily.    Marland Kitchen omeprazole  (PRILOSEC) 40 MG capsule Take 1 capsule (40 mg total) by mouth daily before breakfast. 30 capsule 0  . pioglitazone (ACTOS) 30 MG tablet Take 15 mg by mouth daily.     . timolol (TIMOPTIC) 0.25 % ophthalmic solution Place 1 drop into both eyes 2 (two) times daily.      No current facility-administered medications for this visit.     Allergies:   Patient has no known allergies.    Social History:  The patient  reports that she has quit smoking. She has a 20.00 pack-year smoking history. She has never used smokeless tobacco. She reports that she does not drink alcohol or use drugs.   Family History:  The patient's family history includes Hypertension in her mother; Other in her sister; Prostate cancer in her father.    ROS:  Please see the history of present illness.   Otherwise, review of systems are positive for none.   All other systems are reviewed and negative.    PHYSICAL EXAM: VS:  BP (!) 158/64 (BP Location: Left Arm, Patient Position: Sitting, Cuff Size: Normal)   Pulse 96   Ht _0  (1.676 m)   Wt 212 lb 8 oz (96.4 kg)   BMI 34.30 kg/m  , BMI Body mass index is 34.3 kg/m. GEN: Well nourished, well developed, in no acute distress  HEENT: normal  Neck: no  carotid bruits, or masses. Mild JVD Cardiac: RRR; no rubs, or gallops, moderate bilateral leg edema .  1 out of 6 systolic ejection murmur at the base. respiratory:  clear to auscultation bilaterally, normal work of breathing GI: soft, nontender, nondistended, + BS MS: no deformity or atrophy  Skin: warm and dry, no rash Neuro:  Strength and sensation are intact Psych: euthymic mood, full affect   EKG:  EKG is ordered today. The ekg ordered today demonstrates normal sinus rhythm with no significant ST or T wave changes.   Recent Labs: 11/15/2016: ALT 9; TSH 2.080 11/26/2016: Magnesium 1.6 11/28/2016: BUN <5; Creatinine, Ser 0.65; Hemoglobin 8.8; Platelets 264; Potassium 4.0; Sodium 141    Lipid Panel    Component  Value Date/Time   CHOL 104 10/22/2016 1814   TRIG 47 10/22/2016 1814   HDL 47 10/22/2016 1814   CHOLHDL 2.2 10/22/2016 1814   VLDL 9 10/22/2016 1814   LDLCALC 48 10/22/2016 1814      Wt Readings from Last 3 Encounters:  02/02/17 212 lb 8 oz (96.4 kg)  12/24/16 209 lb (94.8 kg)  12/02/16 175 lb 4 oz (79.5 kg)        PAD Screen 12/02/2016  Previous PAD dx? No  Previous surgical procedure? Yes  Dates of procedures Right leg stent  Pain with walking? No  Feet/toe relief with dangling? No  Painful, non-healing ulcers? No  Extremities discolored? No      ASSESSMENT AND  PLAN:  1.  Chronic diastolic heart failure: The patient appears to be volume overloaded but for unclear reasons, she was still taking salt tablets.  I suspect that she was given dose during her initial hospitalization with hyponatremia. I asked her to discontinue salt tablet and continue current dose of furosemide 20 mg once daily.  2.  Moderate to severe pulmonary hypertension: Likely due to a combination of chronic diastolic heart failure as well as underlying sleep apnea.  I am going to repeat her echocardiogram in 2 months and strongly advised her to follow-up for CPAP titration.   3.  Exertional dyspnea: Likely due to all of the above she has multiple risk factors for coronary artery disease including diabetes.  I will consider a pharmacologic nuclear stress test after her echocardiogram.    Disposition:   FU with me in 2 months  Signed,  Kathlyn Sacramento, MD  02/02/2017 2:31 PM    [place]

## 2017-02-04 ENCOUNTER — Encounter: Payer: Self-pay | Admitting: Internal Medicine

## 2017-02-04 DIAGNOSIS — R0602 Shortness of breath: Secondary | ICD-10-CM

## 2017-02-12 ENCOUNTER — Ambulatory Visit (INDEPENDENT_AMBULATORY_CARE_PROVIDER_SITE_OTHER): Payer: Medicare Other | Admitting: Pulmonary Disease

## 2017-02-12 ENCOUNTER — Encounter: Payer: Self-pay | Admitting: Internal Medicine

## 2017-02-12 VITALS — BP 180/80 | HR 91 | Ht 66.0 in | Wt 212.0 lb

## 2017-02-12 DIAGNOSIS — G4733 Obstructive sleep apnea (adult) (pediatric): Secondary | ICD-10-CM | POA: Diagnosis not present

## 2017-02-12 DIAGNOSIS — J9611 Chronic respiratory failure with hypoxia: Secondary | ICD-10-CM

## 2017-02-12 NOTE — Patient Instructions (Signed)
Continue oxygen therapy as close to 24 hours per day Continue BiPAP with sleep Work on weight loss as we discussed Follow up in 3-4 months with Dr Mortimer Fries

## 2017-02-12 NOTE — Progress Notes (Signed)
PULMONARY OFFICE FOLLOW-UP NOTE  Requesting MD/Service: Rockey Situ Date of initial consultation: 12/24/16 Reason for consultation: Excessive daytime sleepiness  PT PROFILE: 71 y.o. female deferred for evaluation of excessive daytime sleepiness.  Also with a history of DVT (S/P IVC filter post), chronic blood loss anemia, former smoker, chronic hypoxemic respiratory failure, pulmonary hypertension (group 3)  DATA: 10/23/16 echocardiogram: LVEF 65-70%.  Left atrium mildly dilated.  Right ventricle mildly dilated.  Right atrium mildly dilated.  Estimated PASP 65 mmHg 11/14/16 LE venous US: RLE DVT right calf.  Superficial thrombosis right greater saphenous vein and the mid thigh and extending into the mid calf. 11/17/16 CT chest: No PE.  Enlarged pulmonary arteries.  Cardiomegaly with right heart enlargement.  Lungs clear 01/26/17 PSG: split night: Severe OSA.  AHI 97.3/hour.  Recommended autoset BiPAP expiratory pressure 13-18 cm H2O, pressure support 5 cm H2O   SUBJ:  Pt of DK.  Here for follow-up after initiation of BiPAP for OSA.  She also has chronic respiratory failure for which she wears oxygen at 2 L/min by nasal cannula nearly 24 hours/day.  She requests a portable oxygen concentrator.  He has no new complaints.  He is tolerating the BiPAP well and wearing it compliantly.  She believes her sleep quality and daytime sleepiness have improved. Denies CP, fever, purulent sputum, hemoptysis and calf tenderness.   OBJ: Vitals:   02/12/17 0833 02/12/17 0836  BP:  (!) 180/80  Pulse:  91  SpO2:  94%  Weight: 96.2 kg (212 lb)   Height: _0  (1.676 m)   2 LPM    EXAM:  Gen: B's, No overt respiratory distress HEENT: NCAT, sclera white, oropharynx normal Neck: Supple without LAN, thyromegaly.  JVP cannot be visualized Lungs: breath sounds diminished throughout, no adventitious sounds Cardiovascular: Alert, no murmurs noted Abdomen: Centripetal obesity, soft, nontender, normal BS Ext:  Symmetric ankle and pretibial edema Neuro: grossly intact  DATA:   BMP Latest Ref Rng & Units 11/28/2016 11/27/2016 11/26/2016  Glucose 65 - 99 mg/dL 172(H) 138(H) 84  BUN 6 - 20 mg/dL <5(L) <5(L) <5(L)  Creatinine 0.44 - 1.00 mg/dL 0.65 0.59 0.63  Sodium 135 - 145 mmol/L 141 142 140  Potassium 3.5 - 5.1 mmol/L 4.0 3.8 3.1(L)  Chloride 101 - 111 mmol/L 103 106 104  CO2 22 - 32 mmol/L 32 31 29  Calcium 8.9 - 10.3 mg/dL 8.8(L) 8.5(L) 8.5(L)    CBC Latest Ref Rng & Units 11/28/2016 11/28/2016 11/27/2016  WBC 3.6 - 11.0 K/uL - 5.3 -  Hemoglobin 12.0 - 16.0 g/dL 8.8(L) 9.0(L) 8.6(L)  Hematocrit 35.0 - 47.0 % 28.0(L) 28.4(L) 27.7(L)  Platelets 150 - 440 K/uL - 264 -    CXR: No new film  IMPRESSION:     ICD-10-CM   1. Chronic respiratory failure with hypoxia (HCC) J96.11   2. Morbid obesity ([place]) E66.01   3. OSA (obstructive sleep apnea) G47.33     PLAN:  Continue oxygen therapy as close to 24 hours per day Continue BiPAP with sleep We discussed the importance of weight loss and strategies Follow up in 3-4 months with Dr Clydell Hakim, MD PCCM service Mobile [phone] Pager [phone] 02/12/2017 8:57 AM

## 2017-02-17 ENCOUNTER — Telehealth: Payer: Self-pay | Admitting: Internal Medicine

## 2017-02-17 NOTE — Telephone Encounter (Signed)
Called and spoke with patient and she stated that Susan Barajas advised her that [facility] didn't have any smaller portable tanks. Advised patient that we sent an order to [facility] to Eval for POC that if pt started asking them about tanks, [facility] probably does not have any smaller tanks than what she has, however, they do have the POC.    Pt stated that she did want to be evaluated for the POC but just was confused about the tanks.  Spoke with Melissa at [facility] and she will reach out to resp team to contact patient back to schedule her to be evaluated for POC.   Nothing else needed at this time for [name].  Staff message sent to Darlina Guys with [facility] for Resp Team to schedule Eval for POC. Rhonda J Cobb

## 2017-02-17 NOTE — Telephone Encounter (Signed)
Pt states [facility] called and they do not have the portable tanks. Please call.

## 2017-02-20 ENCOUNTER — Telehealth: Payer: Self-pay | Admitting: *Deleted

## 2017-02-20 NOTE — Telephone Encounter (Signed)
Called patient with results of ONO. Patient currently on 2L 02 qhs. There will be no change in therapy. Patient has order to be evaluated for POC. Nothing further needed.

## 2017-04-03 ENCOUNTER — Ambulatory Visit (INDEPENDENT_AMBULATORY_CARE_PROVIDER_SITE_OTHER): Payer: Medicare Other

## 2017-04-03 ENCOUNTER — Other Ambulatory Visit: Payer: Self-pay

## 2017-04-03 ENCOUNTER — Ambulatory Visit (INDEPENDENT_AMBULATORY_CARE_PROVIDER_SITE_OTHER): Payer: Medicare Other | Admitting: Cardiovascular Disease

## 2017-04-03 ENCOUNTER — Encounter: Payer: Self-pay | Admitting: Cardiovascular Disease

## 2017-04-03 VITALS — BP 130/54 | HR 88 | Ht 66.0 in | Wt 207.0 lb

## 2017-04-03 DIAGNOSIS — I5032 Chronic diastolic (congestive) heart failure: Secondary | ICD-10-CM

## 2017-04-03 DIAGNOSIS — I272 Pulmonary hypertension, unspecified: Secondary | ICD-10-CM | POA: Diagnosis not present

## 2017-04-03 DIAGNOSIS — R0602 Shortness of breath: Secondary | ICD-10-CM

## 2017-04-03 NOTE — Patient Instructions (Addendum)
Medication Instructions:  Your physician recommends that you continue on your current medications as directed. Please refer to the Current Medication list given to you today.   Labwork: none  Testing/Procedures: Your physician has requested that you have a lexiscan myoview. For further information please visit [email or web address]. Please follow instruction sheet, as given.  [place]  Your caregiver has ordered a Stress Test with nuclear imaging. The purpose of this test is to evaluate the blood supply to your heart muscle. This procedure is referred to as a "Non-Invasive Stress Test." This is because other than having an IV started in your vein, nothing is inserted or "invades" your body. Cardiac stress tests are done to find areas of poor blood flow to the heart by determining the extent of coronary artery disease (CAD). Some patients exercise on a treadmill, which naturally increases the blood flow to your heart, while others who are  unable to walk on a treadmill due to physical limitations have a pharmacologic/chemical stress agent called Lexiscan . This medicine will mimic walking on a treadmill by temporarily increasing your coronary blood flow.   Please note: these test may take anywhere between 2-4 hours to complete  PLEASE REPORT TO [place] AT THE FIRST DESK WILL DIRECT YOU WHERE TO GO  Date of Procedure:_Tuesday, January 8, 2019____  Arrival Time for Procedure:  7:45am  Instructions regarding medication:   __xx__ : Hold diabetes medication morning of procedure  ____:  Hold lasix the morning of procedure  PLEASE NOTIFY THE OFFICE AT LEAST 24 HOURS IN ADVANCE IF YOU ARE UNABLE TO KEEP YOUR APPOINTMENT.  [phone] AND  PLEASE NOTIFY NUCLEAR MEDICINE AT [facility] AT LEAST 24 HOURS IN ADVANCE IF YOU ARE UNABLE TO KEEP YOUR APPOINTMENT. [phone]  How to prepare for your Myoview test:  1. Do not eat or drink after midnight 2. No caffeine  for 24 hours prior to test 3. No smoking 24 hours prior to test. 4. Your medication may be taken with water.  If your doctor stopped a medication because of this test, do not take that medication. 5. Ladies, please do not wear dresses.  Skirts or pants are appropriate. Please wear a short sleeve shirt. 6. No perfume, cologne or lotion. 7. Wear comfortable walking shoes. No heels!            Follow-Up: Your physician wants you to follow-up in: 6 months with Dr. Fletcher Anon.  You will receive a reminder letter in the mail two months in advance. If you don't receive a letter, please call our office to schedule the follow-up appointment.   Any Other Special Instructions Will Be Listed Below (If Applicable).     If you need a refill on your cardiac medications before your next appointment, please call your pharmacy.  Cardiac Nuclear Scan A cardiac nuclear scan is a test that measures blood flow to the heart when a person is resting and when he or she is exercising. The test looks for problems such as:  Not enough blood reaching a portion of the heart.  The heart muscle not working normally.  You may need this test if:  You have heart disease.  You have had abnormal lab results.  You have had heart surgery or angioplasty.  You have chest pain.  You have shortness of breath.  In this test, a radioactive dye (tracer) is injected into your bloodstream. After the tracer has traveled to your heart, an imaging device is used  to measure how much of the tracer is absorbed by or distributed to various areas of your heart. This procedure is usually done at a hospital and takes 2-4 hours. Tell a health care provider about:  Any allergies you have.  All medicines you are taking, including vitamins, herbs, eye drops, creams, and over-the-counter medicines.  Any problems you or family members have had with the use of anesthetic medicines.  Any blood disorders you have.  Any surgeries you  have had.  Any medical conditions you have.  Whether you are pregnant or may be pregnant. What are the risks? Generally, this is a safe procedure. However, problems may occur, including:  Serious chest pain and heart attack. This is only a risk if the stress portion of the test is done.  Rapid heartbeat.  Sensation of warmth in your chest. This usually passes quickly.  What happens before the procedure?  Ask your health care provider about changing or stopping your regular medicines. This is especially important if you are taking diabetes medicines or blood thinners.  Remove your jewelry on the day of the procedure. What happens during the procedure?  An IV tube will be inserted into one of your veins.  Your health care provider will inject a small amount of radioactive tracer through the tube.  You will wait for 20-40 minutes while the tracer travels through your bloodstream.  Your heart activity will be monitored with an electrocardiogram (ECG).  You will lie down on an exam table.  Images of your heart will be taken for about 15-20 minutes.  You may be asked to exercise on a treadmill or stationary bike. While you exercise, your heart's activity will be monitored with an ECG, and your blood pressure will be checked. If you are unable to exercise, you may be given a medicine to increase blood flow to parts of your heart.  When blood flow to your heart has peaked, a tracer will again be injected through the IV tube.  After 20-40 minutes, you will get back on the exam table and have more images taken of your heart.  When the procedure is over, your IV tube will be removed. The procedure may vary among health care providers and hospitals. Depending on the type of tracer used, scans may need to be repeated 3-4 hours later. What happens after the procedure?  Unless your health care provider tells you otherwise, you may return to your normal schedule, including diet,  activities, and medicines.  Unless your health care provider tells you otherwise, you may increase your fluid intake. This will help flush the contrast dye from your body. Drink enough fluid to keep your urine clear or pale yellow.  It is up to you to get your test results. Ask your health care provider, or the department that is doing the test, when your results will be ready. Summary  A cardiac nuclear scan measures the blood flow to the heart when a person is resting and when he or she is exercising.  You may need this test if you are at risk for heart disease.  Tell your health care provider if you are pregnant.  Unless your health care provider tells you otherwise, increase your fluid intake. This will help flush the contrast dye from your body. Drink enough fluid to keep your urine clear or pale yellow. This information is not intended to replace advice given to you by your health care provider. Make sure you discuss any questions you have  with your health care provider. Document Released: 04/25/2004 Document Revised: 04/02/2016 Document Reviewed: 03/09/2013 Elsevier Interactive Patient Education  2017 [employer].

## 2017-04-03 NOTE — Progress Notes (Signed)
Cardiology Office Note   Date:  04/03/2017   ID:  Susan Barajas, DOB [birth year], MRN [id number]  PCP:  Albina Billet, MD  Cardiologist:   Kathlyn Sacramento, MD   Chief Complaint  Patient presents with  . Other    2 month follow up. Patient denies chest pain and SOB. Meds reviewed verbally with patient.       History of Present Illness: Susan Barajas is a 71 y.o. 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 The patient has extensive medical problems that include diabetes mellitus for at least 40 years, hyperlipidemia and obesity. She had multiple hospitalizations in 2018. The first one was for hyponatremia which was thought to be due to hydrochlorothiazide which was discontinued. She was then hospitalized with right leg DVT but was noted to have worsening anemia and thus could not be anticoagulated. She had an IVC filter placed. Most recent hospitalization was due to worsening anemia requiring transfusion. She had GI workup with no obvious source but she is still going to follow-up with GI. 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 Peak pulmonary pressure was 65 mmHg. The patient was placed on small dose Lasix 20 mg daily. She was diagnosed with sleep apnea currently uses CPAP.  During last visit, I discontinued sodium tablets that she was taking.  She continues to use oxygen.  Overall, she feels better than before with improvement in shortness of breath and no recent chest pain.  Her weight went down about 4 pounds.   Past Medical History:  Diagnosis Date  . Asthma 1950  . Clotting disorder (HCC)    right leg  . Diabetes ([place]) Age 42   Type 2; insulin dependent  . Glaucoma 1949  . Hyperlipidemia   . Hypertension   . Obesity, unspecified   . Personal  history of tobacco use, presenting hazards to health   . Varicose veins of lower extremities with other complications     Past Surgical History:  Procedure Laterality Date  . BREAST BIOPSY Right 1991  . BREAST BIOPSY Left 2013  . COLONOSCOPY  2010   Dr. Vira Agar  . COLONOSCOPY  Jan 2016   Dr Vira Agar  . ESOPHAGOGASTRODUODENOSCOPY N/A 11/26/2016   Procedure: ESOPHAGOGASTRODUODENOSCOPY (EGD);  Surgeon: Lin Landsman, MD;  Location: [facility] ENDOSCOPY;  Service: Gastroenterology;  Laterality: N/A;  . GIVENS CAPSULE STUDY N/A 11/26/2016   Procedure: GIVENS CAPSULE STUDY, if EGD negative, plan to drop capsule with scope if EGD negative;  Surgeon: Lin Landsman, MD;  Location: [facility] ENDOSCOPY;  Service: Gastroenterology;  Laterality: N/A;  . IVC FILTER INSERTION N/A 11/14/2016   Procedure: IVC Filter Insertion;  Surgeon: Katha Cabal, MD;  Location: [place] CV LAB;  Service: Cardiovascular;  Laterality: N/A;  . VEIN SURGERY Right 2006   Vein Closure Procedure; RF ablation of right GSV     Current Outpatient Medications  Medication Sig Dispense Refill  . acetaminophen (TYLENOL) 325 MG tablet Take 2 tablets (650 mg total) by mouth every 6 (six) hours as needed for mild pain (or Fever >/= 101).    Marland Kitchen aspirin 81 MG chewable tablet Chew 1 tablet (81 mg total) by mouth daily. 30 tablet 0  . ferrous gluconate (FERGON) 325 MG tablet Take 325 mg by mouth daily.    . furosemide (LASIX) 20  MG tablet Take 1 tablet (20 mg total) by mouth daily. 30 tablet 0  . HUMALOG KWIKPEN 100 UNIT/ML KiwkPen Inject 10 Units into the skin 2 (two) times daily.     . insulin glargine (LANTUS) 100 UNIT/ML injection Inject 32 Units into the skin daily.     Marland Kitchen latanoprost (XALATAN) 0.005 % ophthalmic solution 1 drop at bedtime.    . lovastatin (MEVACOR) 20 MG tablet Take 20 mg by mouth at bedtime.    . metFORMIN (GLUCOPHAGE) 1000 MG tablet Take 1,000 mg by mouth daily.     . Multiple Vitamin (MULTIVITAMIN)  tablet Take 1 tablet by mouth daily.    Marland Kitchen omeprazole (PRILOSEC) 40 MG capsule Take 1 capsule (40 mg total) by mouth daily before breakfast. 30 capsule 0  . pioglitazone (ACTOS) 30 MG tablet Take 15 mg by mouth daily.     . timolol (TIMOPTIC) 0.25 % ophthalmic solution Place 1 drop into both eyes 2 (two) times daily.      No current facility-administered medications for this visit.     Allergies:   Patient has no known allergies.    Social History:  The patient  reports that she has quit smoking. She has a 20.00 pack-year smoking history. she has never used smokeless tobacco. She reports that she does not drink alcohol or use drugs.   Family History:  The patient's family history includes Hypertension in her mother; Other in her sister; Prostate cancer in her father.    ROS:  Please see the history of present illness.   Otherwise, review of systems are positive for none.   All other systems are reviewed and negative.    PHYSICAL EXAM: VS:  BP (!) 130/54 (BP Location: Left Arm, Patient Position: Sitting, Cuff Size: Normal)   Pulse 88   Ht _0  (1.676 m)   Wt 207 lb (93.9 kg)   BMI 33.41 kg/m  , BMI Body mass index is 33.41 kg/m. GEN: Well nourished, well developed, in no acute distress  HEENT: normal  Neck: no  carotid bruits, or masses. no JVD Cardiac: RRR; no rubs, or gallops, trace bilateral leg edema .  1 /6 systolic ejection murmur at the base. respiratory:  clear to auscultation bilaterally, normal work of breathing GI: soft, nontender, nondistended, + BS MS: no deformity or atrophy  Skin: warm and dry, no rash Neuro:  Strength and sensation are intact Psych: euthymic mood, full affect   EKG:  EKG is ordered today. The ekg ordered today demonstrates normal sinus rhythm with PVC.   Recent Labs: 11/15/2016: ALT 9; TSH 2.080 11/26/2016: Magnesium 1.6 11/28/2016: BUN <5; Creatinine, Ser 0.65; Hemoglobin 8.8; Platelets 264; Potassium 4.0; Sodium 141    Lipid Panel      Component Value Date/Time   CHOL 104 10/22/2016 1814   TRIG 47 10/22/2016 1814   HDL 47 10/22/2016 1814   CHOLHDL 2.2 10/22/2016 1814   VLDL 9 10/22/2016 1814   LDLCALC 48 10/22/2016 1814      Wt Readings from Last 3 Encounters:  04/03/17 207 lb (93.9 kg)  02/12/17 212 lb (96.2 kg)  02/02/17 212 lb 8 oz (96.4 kg)        PAD Screen 12/02/2016  Previous PAD dx? No  Previous surgical procedure? Yes  Dates of procedures Right leg stent  Pain with walking? No  Feet/toe relief with dangling? No  Painful, non-healing ulcers? No  Extremities discolored? No      ASSESSMENT AND PLAN:  1.  Chronic diastolic heart failure: The patient now appears to be euvolemic on small dose furosemide.  Blood pressure is controlled.  Continue same management.   2.  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   She had a repeat echocardiogram today LV systolic function and no convincing signs of pulmonary hypertension.  3.  Exertional dyspnea: Given prolonged history of type 2 diabetes and multiple risk factors for coronary artery disease I recommend evaluation with a pharmacologic  nuclear stress test.   Disposition:   FU with me in 6 months  Signed,  Kathlyn Sacramento, MD  04/03/2017 2:36 PM    [place]

## 2017-04-20 ENCOUNTER — Encounter: Payer: Self-pay | Admitting: *Deleted

## 2017-04-27 ENCOUNTER — Ambulatory Visit: Payer: Medicare Other | Admitting: Internal Medicine

## 2017-04-27 ENCOUNTER — Encounter: Payer: Self-pay | Admitting: Internal Medicine

## 2017-04-27 VITALS — BP 148/80 | HR 108 | Ht 66.0 in | Wt 208.0 lb

## 2017-04-27 DIAGNOSIS — G4733 Obstructive sleep apnea (adult) (pediatric): Secondary | ICD-10-CM | POA: Diagnosis not present

## 2017-04-27 NOTE — Progress Notes (Signed)
Name: Susan Barajas MRN: [id number] DOB: February 16, 1946     CONSULTATION DATE: 04/27/2017   REFERRING MD :  Dr. Rockey Situ  CHIEF COMPLAINT: excessive daytime sleepiness  STUDIES:  Ct chest 11/17/2016 I have Independently reviewed images of  CT chest   on 04/27/2017 Interpretation: No evidence of PE, possible little enlarged pulmonary artery, right peritracheal mild reactive adenopathy Probable underlying interstitial edema  Ultrasound on 11/14/16 + Right leg DVT   HISTORY OF PRESENT ILLNESS:   Patient feeling m cuh better OSA dx with AHI 97  compliance report shows 100% compliance >4 hrs 100% compliance biPAP 18/13 AHI down to 0.8  daytime sleepiness and fatigue much improved  REVIEW OF SYSTEMS:   Constitutional: Negative for fever, chills, weight loss, malaise/fatigue and diaphoresis.  HENT: Negative for hearing loss, ear pain, nosebleeds, congestion, sore throat, neck pain, tinnitus and ear discharge.   Eyes: Negative for blurred vision, double vision, photophobia, pain, discharge and redness.  Respiratory: Negative for cough, hemoptysis, sputum production, +shortness of breath, -wheezing and stridor.   Cardiovascular: Negative for chest pain, palpitations, orthopnea, claudication, +leg swelling and PND.  Gastrointestinal: Negative for heartburn, nausea, vomiting, abdominal pain, diarrhea, constipation, blood in stool and melena.  ALL OTHER ROS ARE NEGATIVE    VITAL SIGNS: BP (!) 148/80 (BP Location: Left Arm, Cuff Size: Normal)   Pulse (!) 108   Ht 5' 6" (1.676 m)   Wt 208 lb (94.3 kg)   SpO2 93%   BMI 33.57 kg/m    Physical Examination:   GENERAL:NAD, no fevers, chills, + fatigue HEAD: Normocephalic, atraumatic.  EYES: Pupils equal, round, reactive to light. Extraocular muscles intact. No scleral icterus.  MOUTH: Moist mucosal membrane.   EAR, NOSE, THROAT: Clear without exudates. No external lesions.  NECK: Supple. No thyromegaly. No nodules. No JVD.    PULMONARY:CTA B/L no wheezes, no crackles, no rhonchi CARDIOVASCULAR: S1 and S2. Regular rate and rhythm. No murmurs, rubs, or gallops. No edema.  PSYCHIATRIC: Mood, affect within normal limits. The patient is awake, alert and oriented x 3. Insight, judgment intact.     ASSESSMENT / PLAN: 72 yo AAF with diastolic heart failure with elevated Pulmonary artery with chronic hypoxic resp failure with sleep apnea in the setting of deconditioned state   #1 excessive daytime sleepiness with snoring and hypoxic respiratory failure +OSA Continue biPAP therapy as prescribed Patient using and benefiting from usage  #2 chronic hypoxic respiratory failure on oxygen at this time continue as prescribed  #3 diastolic heart failure follow-up with cardiology as recommended Lasix as tolerated  #4 elevated pulmonary artery pressures most likely related to diastolic heart failure with  secondary pulmonary hypertension Continue biPAP as prescribed  #5Obesity -recommend significant weight loss -recommend changing diet  #6Deconditioned state -Recommend increased daily activity and exercise     Patient atisfied with Plan of action and management. All questions answered Follow up in 1 year  Melis Trochez Patricia Pesa, M.D.  [facility]  Medical Director [place] Director [facility] Cardio-Pulmonary Department

## 2017-04-27 NOTE — Patient Instructions (Signed)
Continue biPAP as prescribed

## 2017-05-29 ENCOUNTER — Telehealth: Payer: Self-pay | Admitting: Cardiovascular Disease

## 2017-05-29 NOTE — Telephone Encounter (Signed)
Rescheduled patient's Lexi scan from Jan 2019  to   06/04/17 at 7:30 am  Please look over and call patient to go over instructions

## 2017-06-04 ENCOUNTER — Ambulatory Visit
Admission: RE | Admit: 2017-06-04 | Discharge: 2017-06-04 | Disposition: A | Discharge status: Home / Self Care | Payer: Medicare Other | Source: Ambulatory Visit | Attending: Cardiovascular Disease | Admitting: Cardiovascular Disease

## 2017-06-04 DIAGNOSIS — I5030 Unspecified diastolic (congestive) heart failure: Secondary | ICD-10-CM

## 2017-06-04 DIAGNOSIS — R0602 Shortness of breath: Secondary | ICD-10-CM | POA: Diagnosis not present

## 2017-06-04 LAB — NM MYOCAR MULTI W/SPECT W/WALL MOTION / EF
CHL CUP MPHR: 148 {beats}/min
CHL CUP NUCLEAR SRS: 2
CHL CUP RESTING HR STRESS: 71 {beats}/min
CSEPEW: 1 METS
Exercise duration (min): 0 min
Exercise duration (sec): 0 s
LV sys vol: 72 mL
LVDIAVOL: 149 mL (ref 46–106)
NUC STRESS TID: 0.94
Peak HR: 100 {beats}/min
Percent HR: 67 %
SDS: 1
SSS: 11

## 2017-06-04 MED ORDER — TECHNETIUM TC 99M TETROFOSMIN IV KIT
32.5800 | PACK | Freq: Once | INTRAVENOUS | Status: AC | PRN
  Administered 2017-06-04: 32.58 via INTRAVENOUS

## 2017-06-04 MED ORDER — TECHNETIUM TC 99M TETROFOSMIN IV KIT
13.7700 | PACK | Freq: Once | INTRAVENOUS | Status: AC | PRN
  Administered 2017-06-04: 13.77 via INTRAVENOUS

## 2017-06-04 MED ORDER — REGADENOSON 0.4 MG/5ML IV SOLN
0.4000 mg | Freq: Once | INTRAVENOUS | Status: AC
  Administered 2017-06-04: 0.4 mg via INTRAVENOUS

## 2018-05-25 ENCOUNTER — Ambulatory Visit: Payer: Medicare Other | Admitting: Internal Medicine

## 2018-05-25 ENCOUNTER — Other Ambulatory Visit: Payer: Self-pay | Admitting: Internal Medicine

## 2018-05-25 ENCOUNTER — Encounter: Payer: Self-pay | Admitting: Internal Medicine

## 2018-05-25 VITALS — BP 130/72 | HR 90 | Ht 66.0 in | Wt 220.0 lb

## 2018-05-25 DIAGNOSIS — J9611 Chronic respiratory failure with hypoxia: Secondary | ICD-10-CM | POA: Diagnosis not present

## 2018-05-25 DIAGNOSIS — G4733 Obstructive sleep apnea (adult) (pediatric): Secondary | ICD-10-CM

## 2018-05-25 NOTE — Patient Instructions (Signed)
Repeat 6MWT on RA   Continue bIPAP as prescribed

## 2018-05-25 NOTE — Progress Notes (Signed)
Name: Susan Barajas MRN: [id number] DOB: June 17, 1945     CONSULTATION DATE: 05/25/2018   REFERRING MD :  Dr. Rockey Situ  CHIEF COMPLAINT: excessive daytime sleepiness  STUDIES:  Ct chest 11/17/2016 I have Independently reviewed images of  CT chest   on 05/25/2018 Interpretation: No evidence of PE, possible little enlarged pulmonary artery, right peritracheal mild reactive adenopathy Probable underlying interstitial edema  Ultrasound on 11/14/16 + Right leg DVT   HISTORY OF PRESENT ILLNESS:   Previous h/o OSA AHI 97  Compliance report shows 93% for days 93% for greater than 4 hours AHI 0.4  BiPAP 18/13 Full facemask  Daytime sleepiness and fatigue much improved Patient has and uses oxygen therapy 24/7 2 L nasal cannula She has been using this for about 1 year  Patient has OSA obesity with cor pulmonale and elevated PA pressures  Daytime sleepiness and fatigue much improved  No signs of infection at this time No signs of decompensated heart failure at this time  Review of Systems:  Gen:  Denies  fever, sweats, chills weigh loss  HEENT: Denies blurred vision, double vision, ear pain, eye pain, hearing loss, nose bleeds, sore throat Cardiac:  No dizziness, chest pain or heaviness, chest tightness,edema, No JVD Resp:   No cough, -sputum production, -shortness of breath,-wheezing, -hemoptysis,  Gi: Denies swallowing difficulty, stomach pain, nausea or vomiting, diarrhea, constipation, bowel incontinence Gu:  Denies bladder incontinence, burning urine Ext:   Denies Joint pain, stiffness or swelling Skin: Denies  skin rash, easy bruising or bleeding or hives Endoc:  Denies polyuria, polydipsia , polyphagia or weight change Psych:   Denies depression, insomnia or hallucinations  Other:  All other systems negative     VITAL SIGNS: BP 130/72 (BP Location: Left Arm, Cuff Size: Normal)   Pulse 90   Ht _0  (1.676 m)   Wt 220 lb (99.8 kg)   SpO2 91%   BMI 35.51 kg/m    Physical Examination:   GENERAL:NAD, no fevers, chills, no weakness no fatigue HEAD: Normocephalic, atraumatic.  EYES: PERLA, EOMI No scleral icterus.  MOUTH: Moist mucosal membrane.  EAR, NOSE, THROAT: Clear without exudates. No external lesions.  NECK: Supple. No thyromegaly.  No JVD.  PULMONARY: CTA B/L no wheezing, rhonchi, crackles CARDIOVASCULAR: S1 and S2. Regular rate and rhythm. No murmurs GASTROINTESTINAL: Soft, nontender, nondistended. Positive bowel sounds.  MUSCULOSKELETAL: No swelling, clubbing, or edema.  NEUROLOGIC: No gross focal neurological deficits. 5/5 strength all extremities SKIN: No ulceration, lesions, rashes, or cyanosis.  PSYCHIATRIC: Insight, judgment intact. -depression -anxiety ALL OTHER ROS ARE NEGATIVE      ASSESSMENT / PLAN: 73 yo AAF with diastolic heart failure with elevated Pulmonary artery with chronic hypoxic resp failure with sleep apnea in the setting of deconditioned state    73 year old African-American female with diastolic heart failure with elevated PA pressures with chronic hypoxic respiratory failure with underlying sleep apnea in the setting of morbid obesity and deconditioned state  #1 excessive daytime sleepiness with OSA Continue BiPAP therapy as prescribed Patient uses and benefits from therapy She needs this for survival  #2 chronic hypoxic respiratory failure from diastolic heart failure and cor pulmonale with OSA Continue oxygen as prescribed Patient uses and benefits from oxygen therapy Patient would like to retest and reassess her oxygen needs during exertion 6-minute walk test ordered   #3 diastolic heart failure Follow-up cardiology as recommended Lasix as tolerated  #4 along with elevated pulmonary pressure most likely related to diastolic heart  failure with obstructive sleep apnea Continue BiPAP as prescribed  Obesity -recommend significant weight loss -recommend changing diet  Deconditioned  state -Recommend increased daily activity and exercise    Patient atisfied with Plan of action and management. All questions answered Follow up in 1 year  Bradly Sangiovanni Susan Barajas, M.D.  [facility]  Medical Director [place] Director [facility] Cardio-Pulmonary Department

## 2018-05-25 NOTE — Addendum Note (Signed)
Addended by: Maryanna Shape A on: 05/25/2018 11:10 AM   Modules accepted: Orders

## 2018-06-08 ENCOUNTER — Telehealth: Payer: Self-pay

## 2018-06-08 ENCOUNTER — Ambulatory Visit: Payer: Medicare Other | Attending: Internal Medicine

## 2018-06-08 DIAGNOSIS — J9611 Chronic respiratory failure with hypoxia: Secondary | ICD-10-CM | POA: Diagnosis not present

## 2018-06-08 NOTE — Telephone Encounter (Signed)
Received call from Cardiopulmonary, patient was doing a 6MW on RA and oxygen desats to 75%. Patient was placed on 5L and oxygen increased to 86%. At rest patient is able to maintain oxygen saturations above 88% but with exertion patient is only able to maintain at 86% on 5L. Orders received from Dr. Mortimer Fries. Nothing further is needed at this time.

## 2018-12-03 ENCOUNTER — Other Ambulatory Visit: Payer: Self-pay

## 2018-12-03 DIAGNOSIS — Z20822 Contact with and (suspected) exposure to covid-19: Secondary | ICD-10-CM

## 2018-12-04 LAB — NOVEL CORONAVIRUS, NAA: SARS-CoV-2, NAA: DETECTED — AB

## 2019-04-13 ENCOUNTER — Ambulatory Visit (INDEPENDENT_AMBULATORY_CARE_PROVIDER_SITE_OTHER): Payer: Medicare Other

## 2019-04-13 ENCOUNTER — Encounter: Payer: Self-pay | Admitting: Pulmonary Disease

## 2019-04-13 ENCOUNTER — Other Ambulatory Visit: Payer: Self-pay

## 2019-04-13 ENCOUNTER — Ambulatory Visit: Payer: Medicare Other | Admitting: Pulmonary Disease

## 2019-04-13 VITALS — BP 142/74 | HR 84 | Temp 97.9°F | Ht 66.0 in | Wt 224.6 lb

## 2019-04-13 DIAGNOSIS — J9611 Chronic respiratory failure with hypoxia: Secondary | ICD-10-CM

## 2019-04-13 DIAGNOSIS — Z23 Encounter for immunization: Secondary | ICD-10-CM | POA: Diagnosis not present

## 2019-04-13 NOTE — Patient Instructions (Addendum)
Shortness of breath Leg swelling Sleep apnea Chronic respiratory failure  We will get an ultrasound of your heart-compare heart functions to the one 2 years ago  Blood work-BNP, BMP  We may need to increase your Lasix but will check your blood work before we increase it  Work on cutting down on the amount of fluid you taking  I will see you in about 3 months  Call with any significant concerns  Pneumonia shot administration today  Get yourself pulse oximeter-to keep an eye on your oxygen Always try to keep it above 89%

## 2019-04-13 NOTE — Progress Notes (Signed)
Susan Barajas    [id number]    05/20/1945  Primary Care Physician:Tate, Leona Carry, MD  Referring Physician: Albina Billet, MD [address]   [place],  [place] [address]  Chief complaint:   Shortness of breath  HPI:  Patient with chronic respiratory failure on chronic O2 about 3 years Pulmonary hypertension Obstructive sleep apnea-on BiPAP therapy  More shortness of breath lately No cough, no chest pain or discomfort Uses BiPAP nightly  Gets about 8 hours of sleep every night She feels BiPAP continues to help Has no concerns about BiPAP  Has been having more leg swelling with more shortness of breath Compliant with Lasix   Outpatient Encounter Medications as of 04/13/2019  Medication Sig  . acetaminophen (TYLENOL) 325 MG tablet Take 2 tablets (650 mg total) by mouth every 6 (six) hours as needed for mild pain (or Fever >/= 101).  Marland Kitchen aspirin 81 MG chewable tablet Chew 1 tablet (81 mg total) by mouth daily.  . ferrous gluconate (FERGON) 325 MG tablet Take 325 mg by mouth daily.  . furosemide (LASIX) 20 MG tablet Take 1 tablet (20 mg total) by mouth daily.  Marland Kitchen HUMALOG KWIKPEN 100 UNIT/ML KiwkPen Inject 10 Units into the skin 2 (two) times daily.   . insulin glargine (LANTUS) 100 UNIT/ML injection Inject 32 Units into the skin daily.   Marland Kitchen latanoprost (XALATAN) 0.005 % ophthalmic solution 1 drop at bedtime.  . lovastatin (MEVACOR) 20 MG tablet Take 20 mg by mouth at bedtime.  . metFORMIN (GLUCOPHAGE) 1000 MG tablet Take 1,000 mg by mouth daily.   . Multiple Vitamin (MULTIVITAMIN) tablet Take 1 tablet by mouth daily.  Marland Kitchen omeprazole (PRILOSEC) 40 MG capsule Take 1 capsule (40 mg total) by mouth daily before breakfast.  . pioglitazone (ACTOS) 30 MG tablet Take 15 mg by mouth daily.   . timolol (TIMOPTIC) 0.25 % ophthalmic solution Place 1 drop into both eyes 2 (two) times daily.    No facility-administered encounter medications on file as of 04/13/2019.     Allergies as of 04/13/2019  . (No Known Allergies)    Past Medical History:  Diagnosis Date  . Asthma 1950  . Clotting disorder (HCC)    right leg  . 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 Glaucoma 1949  . Hyperlipidemia   . Hypertension   . Obesity, unspecified   . Personal history of tobacco use, presenting hazards to health   . Varicose veins of lower extremities with other complications     Past Surgical History:  Procedure Laterality Date  . BREAST BIOPSY Right 1991  . BREAST BIOPSY Left 2013  . COLONOSCOPY  2010   Dr. Vira Agar  . COLONOSCOPY  Jan 2016   Dr Vira Agar  . ESOPHAGOGASTRODUODENOSCOPY N/A 11/26/2016   Procedure: ESOPHAGOGASTRODUODENOSCOPY (EGD);  Surgeon: Lin Landsman, MD;  Location: [facility] ENDOSCOPY;  Service: Gastroenterology;  Laterality: N/A;  . GIVENS CAPSULE STUDY N/A 11/26/2016   Procedure: GIVENS CAPSULE STUDY, if EGD negative, plan to drop capsule with scope if EGD negative;  Surgeon: Lin Landsman, MD;  Location: [facility] ENDOSCOPY;  Service: Gastroenterology;  Laterality: N/A;  . IVC FILTER INSERTION N/A 11/14/2016   Procedure: IVC Filter Insertion;  Surgeon: Katha Cabal, MD;  Location: [place] CV LAB;  Service: Cardiovascular;  Laterality: N/A;  . VEIN SURGERY Right 2006   Vein Closure Procedure; RF ablation of right GSV  Family History  Problem Relation Age of Onset  . Prostate cancer Father   . Other Sister        mouth cancer  . Hypertension Mother     Social History   Socioeconomic History  . Marital status: Single    Spouse name: Not on file  . Number of children: Not on file  . Years of education: Not on file  . Highest education level: Not on file  Occupational History  . Occupation: CELL    Employer: RETIRED  Tobacco Use  . Smoking status: Former Smoker    Packs/day: 1.50    Years: 20.00    Pack years: 30.00    Quit date: 1999    Years since quitting: 22.0  . Smokeless tobacco: Never  Used  Substance and Sexual Activity  . Alcohol use: No    Alcohol/week: 0.0 standard drinks  . Drug use: No  . Sexual activity: Not Currently  Other Topics Concern  . Not on file  Social History Narrative   Independent at baseline. Lives at home with her brother   Social Determinants of Health   Financial Resource Strain:   . Difficulty of Paying Living Expenses: Not on file  Food Insecurity:   . Worried About Charity fundraiser in the Last Year: Not on file  . Ran Out of Food in the Last Year: Not on file  Transportation Needs:   . Lack of Transportation (Medical): Not on file  . Lack of Transportation (Non-Medical): Not on file  Physical Activity:   . Days of Exercise per Week: Not on file  . Minutes of Exercise per Session: Not on file  Stress:   . Feeling of Stress : Not on file  Social Connections:   . Frequency of Communication with Friends and Family: Not on file  . Frequency of Social Gatherings with Friends and Family: Not on file  . Attends Religious Services: Not on file  . Active Member of Clubs or Organizations: Not on file  . Attends Archivist Meetings: Not on file  . Marital Status: Not on file  Intimate Partner Violence:   . Fear of Current or Ex-Partner: Not on file  . Emotionally Abused: Not on file  . Physically Abused: Not on file  . Sexually Abused: Not on file    Review of Systems  Constitutional: Positive for fatigue.  Respiratory: Positive for apnea and shortness of breath.   Cardiovascular: Positive for leg swelling.  Psychiatric/Behavioral: Positive for sleep disturbance.  All other systems reviewed and are negative.   Vitals:   04/13/19 1633  BP: (!) 142/74  Pulse: 84  Temp: 97.9 F (36.6 C)  SpO2: (!) 82%     Physical Exam  Constitutional: She appears well-developed.  HENT:  Head: Normocephalic and atraumatic.  Eyes: Pupils are equal, round, and reactive to light. Conjunctivae are normal. Right eye exhibits no  discharge. Left eye exhibits no discharge.  Neck: No tracheal deviation present. No thyromegaly present.  Cardiovascular: Normal rate and regular rhythm.  Pulmonary/Chest: Effort normal. No respiratory distress. She has no wheezes. She has no rales.  Poor air movement bilaterally  Abdominal: Soft.  Musculoskeletal:        General: Edema present. Normal range of motion.     Cervical back: Normal range of motion and neck supple.  Neurological: She is alert. No cranial nerve deficit.  Skin: Skin is warm and dry. She is not diaphoretic. No erythema.  Psychiatric: She  has a normal mood and affect.   Data Reviewed: Last echocardiogram was 04/03/2017 No recent chest x-ray on record  BiPAP compliance reveals 100% compliant with BiPAP Settings of 18/13 Residual AHI of 0.1  Assessment:  Decompensated heart failure -Increasing shortness of breath -Increasing leg edema  Obstructive sleep apnea -Compliant with BiPAP -Continue BiPAP use  Chronic hypoxemic respiratory failure -Continue oxygen supplementation -Encourage patient to procure a pulse oximeter to monitor saturations  Shortness of breath -Multifactorial  Plan/Recommendations:  We will obtain echocardiogram Obtain chest x-ray Obtain BNP and BMP  May need increase in Lasix Currently taking 20 mg a day  Administer Pneumovax vaccine today-23 valent  We will follow-up in 3 months  Encouraged to stay active  Encouraged to monitor fluid intake-reduce as tolerable  Encouraged to call if any significant concerns   Sherrilyn Rist MD [facility] 04/13/2019, 5:03 PM  CC: Albina Billet, MD

## 2019-04-14 LAB — BASIC METABOLIC PANEL
BUN: 12 mg/dL (ref 6–23)
CO2: 31 mEq/L (ref 19–32)
Calcium: 8.8 mg/dL (ref 8.4–10.5)
Chloride: 107 mEq/L (ref 96–112)
Creatinine, Ser: 0.8 mg/dL (ref 0.40–1.20)
GFR: 84.87 mL/min (ref >60.00)
Glucose, Bld: 166 mg/dL — ABNORMAL HIGH (ref 70–99)
Potassium: 4.7 mEq/L (ref 3.5–5.1)
Sodium: 142 mEq/L (ref 135–145)

## 2019-04-14 LAB — BRAIN NATRIURETIC PEPTIDE: Pro B Natriuretic peptide (BNP): 819 pg/mL — ABNORMAL HIGH (ref 0.0–100.0)

## 2019-04-18 ENCOUNTER — Other Ambulatory Visit (HOSPITAL_COMMUNITY): Payer: Medicare Other

## 2019-04-20 ENCOUNTER — Ambulatory Visit (HOSPITAL_COMMUNITY): Payer: Medicare Other | Attending: Cardiovascular Disease

## 2019-04-20 ENCOUNTER — Other Ambulatory Visit: Payer: Self-pay

## 2019-04-20 DIAGNOSIS — J9611 Chronic respiratory failure with hypoxia: Secondary | ICD-10-CM | POA: Diagnosis not present

## 2019-04-20 MED ORDER — PERFLUTREN LIPID MICROSPHERE
1.0000 mL | INTRAVENOUS | Status: AC | PRN
  Administered 2019-04-20: 2 mL via INTRAVENOUS

## 2019-05-12 NOTE — Progress Notes (Signed)
Cardiology Office Note    Date:  05/13/2019   ID:  Susan Barajas, DOB [birth year], MRN [id number]  PCP:  Albina Billet, MD  Cardiologist:  Kathlyn Sacramento, MD  Electrophysiologist:  None   Chief Complaint: Follow-up  History of Present Illness:   Susan Barajas is a 74 y.o. female with history of chronic hypoxic respiratory failure on supplemental oxygen, HFpEF, moderate to severe pulmonary hypertension, COVID-19 infection in 11/2018 diabetes mellitus for greater than 40 years, right lower extremity DVT in 2248 complicated by worsening anemia status post IVC filter, hypertension, hyperlipidemia, obesity, and sleep apnea on BiPAP who presents for follow-up of her CHF.  Patient had multiple hospitalizations in 2018 including hyponatremia thought to be secondary to hydrochlorothiazide previously on sodium tabs, right lower extremity DVT complicated by worsening anemia status post IVC filter placement with repeat hospitalization for worsening anemia requiring blood transfusion and GI work-up without obvious source of bleed.  Echo in 10/2016 showed a hyperdynamic LV SF with an EF of 65 to 70%, moderate concentric LVH, no regional wall motion abnormalities, grade 1 diastolic dysfunction, mildly dilated left atrium, mildly dilated RV, mildly dilated right atrium, PASP 65 mmHg.  Following this, she was placed on low-dose Lasix.  She was last seen in the office in 03/2017 with improved shortness of breath.  She appeared euvolemic on low-dose furosemide.  Echo at that time showed an EF of 60 to 65%, mild concentric LVH, no regional wall motion normalities, grade 1 diastolic dysfunction, mildly dilated left atrium, unable to accurately estimate PASP.  In the setting of exertional dyspnea with multiple risk factors she underwent Lexiscan Myoview in 05/2017 which showed a small defect of mild severity present in the apex location likely secondary to breast attenuation artifact with an EF of 55 to 65%.   Overall, this was a low risk study.  Patient tested positive for COVID-19 in 11/2018 and did not require hospital admission.  More recently, she was seen by pulmonology on 04/13/2019 for follow-up of her pulmonary hypertension.  She was noting more shortness of breath and lower extremity swelling lately.  She reported compliance with Lasix 20 mg daily.  Documented weight in their office of 224 pounds (patient weighed 207 pounds in our office in 03/2017).  Oxygen saturation 82%.  Labs obtained at that time showed potassium at goal, hyperglycemia, normal renal function, and BNP of 819.  She subsequently underwent 2D echo on 04/20/2019 which showed a hyperdynamic LV systolic function with an EF of 65 to 70%, no LVH, unable to evaluate LV diastolic function, mildly reduced RV SF with severely enlarged RV cavity size, normal size left atrium, severely dilated right atrium, trivial mitral regurgitation, estimated right atrial pressure of approximately 15 mmHg with left bowing of the intra-atrial septum suggestive of elevated RAP, severely elevated PASP estimated at 80 mmHg.  She comes in today noting worsening dyspnea and lower extremity swelling that dates back to shortly after Christmas.  In this setting, she has gone up from 2 L supplemental oxygen to 3 L.  She indicates her weight has been stable though does not weigh herself at home typically.  Her weight is up 15 pounds today when compared to her visit in 2018 with [name].  She has been maintained on furosemide 20 mg daily.  She denies any orthopnea, abdominal distention, or early satiety.  She indicates pre-Covid infection she was able to typically do light household chores first thing in the morning for several hours  without need of her supplemental oxygen.  However, since her Covid infection she has been dependent on supplemental oxygen 24/7.  There have been no changes in her diet.  She does not add extra salt to food and tries to eat low-sodium foods.  She  drinks less than 2 L of fluid per day.  No dizziness, presyncope, syncope, or falls.  No chest pain.   Labs independently reviewed: 03/2019 - BNP 19, potassium 4.7, BUN 12, serum creatinine 0.8  Past Medical History:  Diagnosis Date  . Asthma 1950  . Clotting disorder (HCC)    right leg  . Diabetes ([place]) Age 1   Type 2; insulin dependent  . Glaucoma 1949  . Hyperlipidemia   . Hypertension   . Obesity, unspecified   . Personal history of tobacco use, presenting hazards to health   . Varicose veins of lower extremities with other complications     Past Surgical History:  Procedure Laterality Date  . BREAST BIOPSY Right 1991  . BREAST BIOPSY Left 2013  . COLONOSCOPY  2010   Dr. Vira Agar  . COLONOSCOPY  Jan 2016   Dr Vira Agar  . ESOPHAGOGASTRODUODENOSCOPY N/A 11/26/2016   Procedure: ESOPHAGOGASTRODUODENOSCOPY (EGD);  Surgeon: Lin Landsman, MD;  Location: [facility] ENDOSCOPY;  Service: Gastroenterology;  Laterality: N/A;  . GIVENS CAPSULE STUDY N/A 11/26/2016   Procedure: GIVENS CAPSULE STUDY, if EGD negative, plan to drop capsule with scope if EGD negative;  Surgeon: Lin Landsman, MD;  Location: [facility] ENDOSCOPY;  Service: Gastroenterology;  Laterality: N/A;  . IVC FILTER INSERTION N/A 11/14/2016   Procedure: IVC Filter Insertion;  Surgeon: Katha Cabal, MD;  Location: [place] CV LAB;  Service: Cardiovascular;  Laterality: N/A;  . VEIN SURGERY Right 2006   Vein Closure Procedure; RF ablation of right GSV    Current Medications: Current Meds  Medication Sig  . acetaminophen (TYLENOL) 325 MG tablet Take 2 tablets (650 mg total) by mouth every 6 (six) hours as needed for mild pain (or Fever >/= 101).  Marland Kitchen aspirin 81 MG chewable tablet Chew 1 tablet (81 mg total) by mouth daily.  . ferrous gluconate (FERGON) 325 MG tablet Take 325 mg by mouth daily.  . furosemide (LASIX) 20 MG tablet Take 1 tablet (20 mg total) by mouth daily.  Marland Kitchen HUMALOG KWIKPEN 100 UNIT/ML KiwkPen  Inject 10 Units into the skin 2 (two) times daily.   . insulin glargine (LANTUS) 100 UNIT/ML injection Inject 32 Units into the skin daily.   Marland Kitchen latanoprost (XALATAN) 0.005 % ophthalmic solution 1 drop at bedtime.  . lovastatin (MEVACOR) 20 MG tablet Take 20 mg by mouth at bedtime.  . metFORMIN (GLUCOPHAGE) 1000 MG tablet Take 1,000 mg by mouth daily.   . Multiple Vitamin (MULTIVITAMIN) tablet Take 1 tablet by mouth daily.  Marland Kitchen omeprazole (PRILOSEC) 40 MG capsule Take 1 capsule (40 mg total) by mouth daily before breakfast.  . pioglitazone (ACTOS) 30 MG tablet Take 15 mg by mouth daily.   . timolol (TIMOPTIC) 0.25 % ophthalmic solution Place 1 drop into both eyes 2 (two) times daily.     Allergies:   Patient has no known allergies.   Social History   Socioeconomic History  . Marital status: Single    Spouse name: Not on file  . Number of children: Not on file  . Years of education: Not on file  . Highest education level: Not on file  Occupational History  . Occupation: CELL    Employer:  RETIRED  Tobacco Use  . Smoking status: Former Smoker    Packs/day: 1.50    Years: 20.00    Pack years: 30.00    Quit date: 1999    Years since quitting: 22.0  . Smokeless tobacco: Never Used  Substance and Sexual Activity  . Alcohol use: No    Alcohol/week: 0.0 standard drinks  . Drug use: No  . Sexual activity: Not Currently  Other Topics Concern  . Not on file  Social History Narrative   Independent at baseline. Lives at home with her brother   Social Determinants of Health   Financial Resource Strain:   . Difficulty of Paying Living Expenses: Not on file  Food Insecurity:   . Worried About Charity fundraiser in the Last Year: Not on file  . Ran Out of Food in the Last Year: Not on file  Transportation Needs:   . Lack of Transportation (Medical): Not on file  . Lack of Transportation (Non-Medical): Not on file  Physical Activity:   . Days of Exercise per Week: Not on file  .  Minutes of Exercise per Session: Not on file  Stress:   . Feeling of Stress : Not on file  Social Connections:   . Frequency of Communication with Friends and Family: Not on file  . Frequency of Social Gatherings with Friends and Family: Not on file  . Attends Religious Services: Not on file  . Active Member of Clubs or Organizations: Not on file  . Attends Archivist Meetings: Not on file  . Marital Status: Not on file     Family History:  The patient's family history includes Hypertension in her mother; Other in her sister; Prostate cancer in her father.  ROS:   Review of Systems  Constitutional: Positive for malaise/fatigue. Negative for chills, diaphoresis, fever and weight loss.  HENT: Negative for congestion.   Eyes: Negative for discharge and redness.  Respiratory: Positive for shortness of breath. Negative for cough, hemoptysis, sputum production and wheezing.   Cardiovascular: Positive for leg swelling. Negative for chest pain, palpitations, orthopnea, claudication and PND.  Gastrointestinal: Negative for abdominal pain, blood in stool, heartburn, melena, nausea and vomiting.  Musculoskeletal: Negative for falls and myalgias.  Skin: Negative for rash.  Neurological: Positive for weakness. Negative for dizziness, tingling, tremors, sensory change, speech change, focal weakness and loss of consciousness.  Endo/Heme/Allergies: Does not bruise/bleed easily.  Psychiatric/Behavioral: Negative for substance abuse. The patient is not nervous/anxious.   All other systems reviewed and are negative.    EKGs/Labs/Other Studies Reviewed:    Studies reviewed were summarized above. The additional studies were reviewed today:  2D echo 04/20/2019:  1. The tricuspid regurgitant velocity is 4.03 m/s, and with an assumed right atrial pressure of 15 mmHg, the estimated right ventricular systolic pressure is severely elevated at 80.0 mmHg.  2. Severely elevated pulmonary artery  systolic pressure.  3. Right ventricular volume and pressure overload.  4. Global right ventricle has mildly reduced systolic function.The right ventricular size is severely enlarged. No increase in right ventricular wall thickness.  5. Left ventricular ejection fraction, by visual estimation, is 65 to 70%. The left ventricle has normal function. There is no left ventricular hypertrophy.  6. Left ventricular diastolic function could not be evaluated.  7. The left ventricle demonstrates regional wall motion abnormalities.  8. Left atrial size was normal.  9. Right atrial size was severely dilated. 10. The mitral valve is normal in structure. Trivial  mitral valve regurgitation. 11. The tricuspid valve is grossly normal. 12. The aortic valve is tricuspid. Aortic valve regurgitation is not visualized. No evidence of aortic valve sclerosis or stenosis. 13. The pulmonic valve was grossly normal. Pulmonic valve regurgitation is trivial. 14. The inferior vena cava is dilated in size with <50% respiratory variability, suggesting right atrial pressure of 15 mmHg. 15. There is left bowing of the interatrial septum, suggestive of elevated right atrial pressure.  In comparison to the previous echocardiogram(s): 04/03/2017. The RV is now severely enlarged with mild to moderately reduced function RVSP is severely elevated at 80 mmHG. Findings concerning for severe pulmonary hypertension with RV failure.   EKG:  EKG is ordered today.  The EKG ordered today demonstrates sinus tachycardia, 102 bpm, left posterior fascicular block, possible prior anterior infarct,, lamotrigine QRS, nonspecific ST-T changes  Recent Labs: 04/13/2019: BUN 12; Creatinine, Ser 0.80; Potassium 4.7; Pro B Natriuretic peptide (BNP) 819.0; Sodium 142  Recent Lipid Panel    Component Value Date/Time   CHOL 104 10/22/2016 1814   TRIG 47 10/22/2016 1814   HDL 47 10/22/2016 1814   CHOLHDL 2.2 10/22/2016 1814   VLDL 9 10/22/2016 1814     LDLCALC 48 10/22/2016 1814    PHYSICAL EXAM:    VS:  BP (!) 160/70 (BP Location: Right Arm, Patient Position: Sitting, Cuff Size: Normal)   Pulse (!) 102   Ht _0  (1.676 m)   Wt 222 lb (100.7 kg)   SpO2 (!) 89% Comment: on 3L O2  BMI 35.83 kg/m   BMI: Body mass index is 35.83 kg/m.  Physical Exam  Constitutional: She is oriented to person, place, and time. She appears well-developed and well-nourished.  HENT:  Head: Normocephalic and atraumatic.  Eyes: Right eye exhibits no discharge. Left eye exhibits no discharge.  Neck: JVD present.  JVD elevated approximately 10 cm  Cardiovascular: Normal rate, regular rhythm, S1 normal, S2 normal and normal heart sounds. Exam reveals no distant heart sounds, no friction rub, no midsystolic click and no opening snap.  No murmur heard. Pulses:      Posterior tibial pulses are 2+ on the right side and 2+ on the left side.  Heart rates improved to the 80s bpm on exam  Pulmonary/Chest: Effort normal. No respiratory distress. She has decreased breath sounds. She has no wheezes. She has no rales. She exhibits no tenderness.  On supplemental oxygen via nasal cannula at 3 L  Abdominal: Soft. She exhibits no distension. There is no abdominal tenderness.  Musculoskeletal:        General: Edema present.     Cervical back: Normal range of motion.     Comments: 2+ bilateral lower extremity pitting edema to the knees  Neurological: She is alert and oriented to person, place, and time.  Skin: Skin is warm and dry. No cyanosis. Nails show no clubbing.  Psychiatric: She has a normal mood and affect. Her speech is normal and behavior is normal. Judgment and thought content normal.    Wt Readings from Last 3 Encounters:  05/13/19 222 lb (100.7 kg)  04/13/19 224 lb 9.6 oz (101.9 kg)  05/25/18 220 lb (99.8 kg)     ASSESSMENT & PLAN:   1. Chronic hypoxic respiratory failure oxygen secondary to acute on chronic HFpEF/severe pulmonary hypertension: She  has a worsening in her baseline dyspnea with associated lower extremity swelling that dates back to shortly after the Christmas holiday.  Recent echo, ordered by pulmonology, demonstrated worsening pulmonary  hypertension with an RVSP of 80, right atrial pressure of 15, and evidence of severely dilated right atrium with left bowing of the interatrial septum.  BNP at that time elevated 819.  Her weight is up 15 pounds when compared to her most recent visit in the office in 2018.  Pulmonology recommends maintaining O2 saturation of 89% or higher with supplemental oxygen.  Titrate furosemide to 40 mg daily x5 days along with addition of KCl.  Follow-up BMP in 1 week.  Refer to advanced heart failure clinic for further evaluation and management of her severe pulmonary hypertension.  Cannot exclude the possibility of her worsening symptoms being related to her recent COVID-19 infection.  2. History of DVT: Status post IVC filter secondary to worsening anemia.  3. HTN: Blood pressure is suboptimally controlled at 160/70.  Currently only on furosemide 20 mg daily.  BP typically runs in the 277A to 128N systolic.  Patient indicates blood pressure is typically well controlled at home.  Possibly in setting of volume overload.  Escalate diuresis as outlined above.  4. HLD: No recent FLP.  Remains on lovastatin.  Followed by PCP.  5. Sleep apnea: On BiPAP.  Followed by pulmonology.  6. History of COVID-19 pneumonia: Possibly exacerbating the above.  Follow-up with pulmonology as directed.  Disposition: F/u with Dr. Fletcher Anon or an APP in 2 months.   Medication Adjustments/Labs and Tests Ordered: Current medicines are reviewed at length with the patient today.  Concerns regarding medicines are outlined above. Medication changes, Labs and Tests ordered today are summarized above and listed in the Patient Instructions accessible in Encounters.   Signed, Christell Faith, PA-C 05/13/2019 10:14 AM     [address] [place], [place] [phone]

## 2019-05-13 ENCOUNTER — Encounter: Payer: Self-pay | Admitting: Physician Assistant

## 2019-05-13 ENCOUNTER — Ambulatory Visit (INDEPENDENT_AMBULATORY_CARE_PROVIDER_SITE_OTHER): Payer: Medicare Other | Admitting: Physician Assistant

## 2019-05-13 ENCOUNTER — Other Ambulatory Visit: Payer: Self-pay

## 2019-05-13 VITALS — BP 160/70 | HR 102 | Ht 66.0 in | Wt 222.0 lb

## 2019-05-13 DIAGNOSIS — Z8701 Personal history of pneumonia (recurrent): Secondary | ICD-10-CM

## 2019-05-13 DIAGNOSIS — I5033 Acute on chronic diastolic (congestive) heart failure: Secondary | ICD-10-CM | POA: Diagnosis not present

## 2019-05-13 DIAGNOSIS — E782 Mixed hyperlipidemia: Secondary | ICD-10-CM

## 2019-05-13 DIAGNOSIS — E785 Hyperlipidemia, unspecified: Secondary | ICD-10-CM

## 2019-05-13 DIAGNOSIS — I110 Hypertensive heart disease with heart failure: Secondary | ICD-10-CM

## 2019-05-13 DIAGNOSIS — G4730 Sleep apnea, unspecified: Secondary | ICD-10-CM

## 2019-05-13 DIAGNOSIS — U071 COVID-19: Secondary | ICD-10-CM

## 2019-05-13 DIAGNOSIS — J9611 Chronic respiratory failure with hypoxia: Secondary | ICD-10-CM | POA: Diagnosis not present

## 2019-05-13 DIAGNOSIS — I10 Essential (primary) hypertension: Secondary | ICD-10-CM

## 2019-05-13 DIAGNOSIS — Z86718 Personal history of other venous thrombosis and embolism: Secondary | ICD-10-CM

## 2019-05-13 DIAGNOSIS — I272 Pulmonary hypertension, unspecified: Secondary | ICD-10-CM | POA: Diagnosis not present

## 2019-05-13 DIAGNOSIS — Z8616 Personal history of COVID-19: Secondary | ICD-10-CM

## 2019-05-13 MED ORDER — POTASSIUM CHLORIDE ER 10 MEQ PO TBCR: 10.0000 meq | EXTENDED_RELEASE_TABLET | Freq: Every day | ORAL | 0 refills | Status: DC

## 2019-05-13 NOTE — Patient Instructions (Addendum)
Medication Instructions:  1- INCREASE Lasix to 2 tablets (40 mg total) once daily for 5 days, then resume previous dose.  2- START K Cl take 1 tablet (10 mEq total) once daily for 5 days only when you take increased Lasix dose *If you need a refill on your cardiac medications before your next appointment, please call your pharmacy*  Lab Work: Your physician recommends that you return for lab work in: 1 week at the medical mall. (BMET) No appt is needed. Hours are M-F 7AM- 6 PM.  If you have labs (blood work) drawn today and your tests are completely normal, you will receive your results only by: Marland Kitchen MyChart Message (if you have MyChart) OR . A paper copy in the mail If you have any lab test that is abnormal or we need to change your treatment, we will call you to review the results.  Testing/Procedures: None ordered   Follow-Up: At [facility], you and your health needs are our priority.  As part of our continuing mission to provide you with exceptional heart care, we have created designated Provider Care Teams.  These Care Teams include your primary Cardiologist (physician) and Advanced Practice Providers (APPs -  Physician Assistants and Nurse Practitioners) who all work together to provide you with the care you need, when you need it.  Your next appointment:   2 month(s)  The format for your next appointment:   In Person  Provider:    You may see Kathlyn Sacramento, MD or Christell Faith, PA-C.

## 2019-05-20 ENCOUNTER — Other Ambulatory Visit
Admission: RE | Admit: 2019-05-20 | Discharge: 2019-05-20 | Disposition: A | Discharge status: Home / Self Care | Payer: Medicare Other | Source: Ambulatory Visit | Attending: Physician Assistant | Admitting: Physician Assistant

## 2019-05-20 DIAGNOSIS — I272 Pulmonary hypertension, unspecified: Secondary | ICD-10-CM | POA: Diagnosis present

## 2019-05-20 LAB — BASIC METABOLIC PANEL
Anion gap: 6 (ref 5–15)
BUN: 12 mg/dL (ref 8–23)
CO2: 27 mmol/L (ref 22–32)
Calcium: 8.6 mg/dL — ABNORMAL LOW (ref 8.9–10.3)
Chloride: 99 mmol/L (ref 98–111)
Creatinine, Ser: 0.72 mg/dL (ref 0.44–1.00)
GFR calc Af Amer: >60 mL/min (ref >60)
GFR calc non Af Amer: >60 mL/min (ref >60)
Glucose, Bld: 280 mg/dL — ABNORMAL HIGH (ref 70–99)
Potassium: 4.4 mmol/L (ref 3.5–5.1)
Sodium: 132 mmol/L — ABNORMAL LOW (ref 135–145)

## 2019-05-23 ENCOUNTER — Telehealth: Payer: Self-pay

## 2019-05-23 NOTE — Telephone Encounter (Signed)
-----  Message from Rise Mu, PA-C sent at 05/20/2019 11:56 AM EST ----- Renal function and potassium are stable following outpatient diuresis. She has pseudohyponatremia secondary to hyperglycemia with a corrected sodium of 135, which is normal. She has known diabetes and should follow up with her PCP regarding this. No changes.

## 2019-05-23 NOTE — Telephone Encounter (Signed)
Call to patient to discuss lab results. Pt verbalized understanding.  No further questions or orders at this time.   Advised pt to call for any further questions or concerns.

## 2019-05-25 ENCOUNTER — Ambulatory Visit: Payer: Medicare Other | Admitting: Internal Medicine

## 2019-05-27 ENCOUNTER — Encounter: Payer: Self-pay | Admitting: Internal Medicine

## 2019-05-27 ENCOUNTER — Other Ambulatory Visit: Payer: Self-pay

## 2019-05-27 ENCOUNTER — Ambulatory Visit: Payer: Medicare Other | Attending: Internal Medicine

## 2019-05-27 ENCOUNTER — Ambulatory Visit (INDEPENDENT_AMBULATORY_CARE_PROVIDER_SITE_OTHER): Payer: Medicare Other | Admitting: Internal Medicine

## 2019-05-27 DIAGNOSIS — J9611 Chronic respiratory failure with hypoxia: Secondary | ICD-10-CM

## 2019-05-27 DIAGNOSIS — G4733 Obstructive sleep apnea (adult) (pediatric): Secondary | ICD-10-CM | POA: Diagnosis not present

## 2019-05-27 DIAGNOSIS — Z23 Encounter for immunization: Secondary | ICD-10-CM | POA: Insufficient documentation

## 2019-05-27 NOTE — Progress Notes (Signed)
   Covid-19 Vaccination Clinic  Name:  Susan Barajas    MRN: [id number] DOB: [birth year]  05/27/2019  [name] was observed post Covid-19 immunization for 15 minutes without incidence. She was provided with Vaccine Information Sheet and instruction to access the V-Safe system.   [name] was instructed to call 911 with any severe reactions post vaccine: Marland Kitchen Difficulty breathing  . Swelling of your face and throat  . A fast heartbeat  . A bad rash all over your body  . Dizziness and weakness    Immunizations Administered    Name Date Dose VIS Date Route   Moderna COVID-19 Vaccine 05/27/2019  1:01 PM 0.5 mL 03/15/2019 Intramuscular   Manufacturer: Moderna   Lot: [id number]   [place]: [id number]

## 2019-05-27 NOTE — Progress Notes (Signed)
I connected with the patient by telephone enabled telemedicine visit and verified that I am speaking with the correct person using two identifiers.    I discussed the limitations, risks, security and privacy concerns of performing an evaluation and management service by telemedicine and the availability of in-person appointments. I also discussed with the patient that there may be a patient responsible charge related to this service. The patient expressed understanding and agreed to proceed.  PATIENT AGREES AND CONFIRMS -YES   Other persons participating in the visit and their role in the encounter: Patient, nursing  This visit type was conducted due to national recommendations for restrictions regarding the COVID-19 Pandemic (e.g. social distancing).  This format is felt to be most appropriate for this patient at this time.  All issues noted in this document were discussed and addressed.        STUDIES:  Ct chest 11/17/2016 I have Independently reviewed images of  CT chest   \ Interpretation: No evidence of PE, possible little enlarged pulmonary artery, right peritracheal mild reactive adenopathy Probable underlying interstitial edema  Ultrasound on 11/14/16 + Right leg DVT   Recent echo, ordered by pulmonology, demonstrated worsening pulmonary hypertension with an RVSP of 80, right atrial pressure of 15, and evidence of severely dilated right atrium with left bowing of the interatrial septum.  BNP at that time elevated 819.  Name: Susan Barajas MRN: [id number] DOB: November 05, 1945     CONSULTATION DATE: 05/27/2019   CHIEF COMPLAINT:  FOLLOW UP OSA     HISTORY OF PRESENT ILLNESS:   Previous history of OSA with severe sleep apnea AHI of 97 Previous compliance report show excellent compliance with AHI down to 0.4  BiPAP 18/13 Full facemask  Daytime sleepiness and fatigue much improved Patient uses oxygen therapy 24/7 2 L nasal cannula She has been doing this for  several years  Patient has OSA obesity with cor pulmonale and elevated PA pressures   No evidence of heart failure at this time No evidence or signs of infection at this time No respiratory distress No fevers, chills, nausea, vomiting, diarrhea No evidence of lower extremity edema No evidence hemoptysis  PATIENT WITH DX OF COVID 19 INFECTION PNEUMONIA 12/2018      Review of Systems:  Gen:  Denies  fever, sweats, chills weight loss  HEENT: Denies blurred vision, double vision, ear pain, eye pain, hearing loss, nose bleeds, sore throat Cardiac:  No dizziness, chest pain or heaviness, chest tightness,edema, No JVD Resp:    +shortness of breath,-wheezing Gi: Denies swallowing difficulty, stomach pain, nausea or vomiting, diarrhea, constipation, bowel incontinence Gu:  Denies bladder incontinence, burning urine Ext:   Denies Joint pain, stiffness or swelling Skin: Denies  skin rash, easy bruising or bleeding or hives Endoc:  Denies polyuria, polydipsia , polyphagia or weight change Psych:   Denies depression, insomnia or hallucinations  Other:  All other systems negative    ASSESSMENT / PLAN:    74 year old African-American female with diastolic heart failure with elevated PA pressures with chronic hypoxic respiratory failure with underlying obstructive sleep apnea in the setting of morbid obesity and deconditioned state, PREVIOUS DX OF COVID PNEUMONIA, Chronic hypoxic respiratory failure oxygen secondary to acute on chronic HFpEF/severe pulmonary hypertension  OSA severe with excessive daytime sleepiness Continue BiPAP therapy as prescribed Patient uses a benefits from there P she needs this for survival  Elevated PA pressures with secondary pulmonary hypertension Related to OSA-severe  Chronic hypoxic respiratory  failure from diastolic heart failure and cor pulmonale with OSA Continue oxygen therapy as prescribed Patient uses and benefits from oxygen therapy  Diastolic  heart failure Follow-up cardiology as recommended Lasix as tolerated   Obesity WEIGHS 222 POUNDS  -recommend significant weight loss -recommend changing diet  Deconditioned state -Recommend increased daily activity and exercise     COVID-19 EDUCATION: The signs and symptoms of COVID-19 were discussed with the patient and how to seek care for testing.  The importance of social distancing was discussed today. Hand Washing Techniques and avoid touching face was advised.     MEDICATION ADJUSTMENTS/LABS AND TESTS ORDERED: Continue BiPAP as prescribed Continue oxygen as prescribed    CURRENT MEDICATIONS REVIEWED AT LENGTH WITH PATIENT TODAY   Patient satisfied with Plan of action and management. All questions answered  Follow up in [place], M.D.  [facility]  Medical Director [place] Director [facility] Cardio-Pulmonary Department

## 2019-05-27 NOTE — Patient Instructions (Signed)
Continue BiPAP as prescribed Continue oxygen as prescribed

## 2019-06-21 ENCOUNTER — Encounter (HOSPITAL_COMMUNITY): Payer: Self-pay | Admitting: Cardiology

## 2019-06-21 ENCOUNTER — Ambulatory Visit (HOSPITAL_COMMUNITY)
Admission: RE | Admit: 2019-06-21 | Discharge: 2019-06-21 | Disposition: A | Discharge status: Home / Self Care | Payer: Medicare Other | Source: Ambulatory Visit | Attending: Cardiology | Admitting: Cardiology

## 2019-06-21 ENCOUNTER — Other Ambulatory Visit: Payer: Self-pay

## 2019-06-21 VITALS — BP 130/82 | HR 89 | Wt 222.2 lb

## 2019-06-21 DIAGNOSIS — Z9981 Dependence on supplemental oxygen: Secondary | ICD-10-CM | POA: Diagnosis not present

## 2019-06-21 DIAGNOSIS — Z7982 Long term (current) use of aspirin: Secondary | ICD-10-CM | POA: Insufficient documentation

## 2019-06-21 DIAGNOSIS — G4733 Obstructive sleep apnea (adult) (pediatric): Secondary | ICD-10-CM | POA: Diagnosis not present

## 2019-06-21 DIAGNOSIS — I110 Hypertensive heart disease with heart failure: Secondary | ICD-10-CM | POA: Insufficient documentation

## 2019-06-21 DIAGNOSIS — E785 Hyperlipidemia, unspecified: Secondary | ICD-10-CM | POA: Insufficient documentation

## 2019-06-21 DIAGNOSIS — J9611 Chronic respiratory failure with hypoxia: Secondary | ICD-10-CM | POA: Insufficient documentation

## 2019-06-21 DIAGNOSIS — E119 Type 2 diabetes mellitus without complications: Secondary | ICD-10-CM | POA: Diagnosis not present

## 2019-06-21 DIAGNOSIS — J849 Interstitial pulmonary disease, unspecified: Secondary | ICD-10-CM | POA: Diagnosis not present

## 2019-06-21 DIAGNOSIS — Z87891 Personal history of nicotine dependence: Secondary | ICD-10-CM | POA: Diagnosis not present

## 2019-06-21 DIAGNOSIS — Z8249 Family history of ischemic heart disease and other diseases of the circulatory system: Secondary | ICD-10-CM | POA: Diagnosis not present

## 2019-06-21 DIAGNOSIS — I272 Pulmonary hypertension, unspecified: Secondary | ICD-10-CM

## 2019-06-21 DIAGNOSIS — I5032 Chronic diastolic (congestive) heart failure: Secondary | ICD-10-CM | POA: Diagnosis present

## 2019-06-21 DIAGNOSIS — Z86718 Personal history of other venous thrombosis and embolism: Secondary | ICD-10-CM | POA: Insufficient documentation

## 2019-06-21 DIAGNOSIS — Z794 Long term (current) use of insulin: Secondary | ICD-10-CM | POA: Diagnosis not present

## 2019-06-21 DIAGNOSIS — Z8616 Personal history of COVID-19: Secondary | ICD-10-CM | POA: Insufficient documentation

## 2019-06-21 DIAGNOSIS — Z79899 Other long term (current) drug therapy: Secondary | ICD-10-CM | POA: Insufficient documentation

## 2019-06-21 LAB — BASIC METABOLIC PANEL
Anion gap: 11 (ref 5–15)
BUN: 17 mg/dL (ref 8–23)
CO2: 25 mmol/L (ref 22–32)
Calcium: 8.8 mg/dL — ABNORMAL LOW (ref 8.9–10.3)
Chloride: 103 mmol/L (ref 98–111)
Creatinine, Ser: 0.69 mg/dL (ref 0.44–1.00)
GFR calc Af Amer: >60 mL/min (ref >60)
GFR calc non Af Amer: >60 mL/min (ref >60)
Glucose, Bld: 92 mg/dL (ref 70–99)
Potassium: 4.2 mmol/L (ref 3.5–5.1)
Sodium: 139 mmol/L (ref 135–145)

## 2019-06-21 LAB — CBC
HCT: 34.7 % — ABNORMAL LOW (ref 36.0–46.0)
Hemoglobin: 10.1 g/dL — ABNORMAL LOW (ref 12.0–15.0)
MCH: 28.8 pg (ref 26.0–34.0)
MCHC: 29.1 g/dL — ABNORMAL LOW (ref 30.0–36.0)
MCV: 98.9 fL (ref 80.0–100.0)
Platelets: 245 10*3/uL (ref 150–400)
RBC: 3.51 MIL/uL — ABNORMAL LOW (ref 3.87–5.11)
RDW: 14.8 % (ref 11.5–15.5)
WBC: 7.7 10*3/uL (ref 4.0–10.5)
nRBC: 0 % (ref 0.0–0.2)

## 2019-06-21 MED ORDER — POTASSIUM CHLORIDE CRYS ER 20 MEQ PO TBCR: 20.0000 meq | EXTENDED_RELEASE_TABLET | Freq: Every day | ORAL | 3 refills | Status: DC

## 2019-06-21 MED ORDER — FUROSEMIDE 20 MG PO TABS: ORAL_TABLET | ORAL | 5 refills | Status: DC

## 2019-06-21 MED ORDER — DAPAGLIFLOZIN PROPANEDIOL 10 MG PO TABS: 10.0000 mg | ORAL_TABLET | Freq: Every day | ORAL | 6 refills | Status: DC

## 2019-06-21 NOTE — Patient Instructions (Signed)
STOP Actos   INCREASE Lasix to 82m twice a day for 3 days, THEN take 479min the morning and 2035mn the evening after that.    START Potassium 20 meq (1 tab) daaily   START Farxiga 59m84m tab) daily   Your physician ordered for you to have several tests. You will get a call to schedule all of them. See below   CT Scan of your chest   VQ scan (lung perfusion imaging)  Your physician has recommended that you have a pulmonary function test. Pulmonary Function Tests are a group of tests that measure how well air moves in and out of your lungs.   Labs today and repeat in 10 days We will only contact you if something comes back abnormal or we need to make some changes. Otherwise no news is good news!    Your physician recommends that you schedule a follow-up appointment in: 3 weeks with Dr McleAundra [facility]inMO[facility] [id number]: [phone]: [phone]  GeorGibraltara Napolitano  06/21/2019  You are scheduled for a Cardiac Catheterization on Thursday, March 18 with Dr. DaltLoralie Champagne. [facility] [phone]00 AM (This time is two hours before your procedure to ensure your preparation). Free valet parking service is available.   Special note: Every effort is made to have your procedure done on time. Please understand that emergencies sometimes delay scheduled procedures.  2. Diet: Do not eat solid foods after midnight.  The patient may have clear liquids until 5am upon the day of the procedure.  3. Labs: done today in office.   You will need a pre procedure COVID test    WHEN:  Monday March 15th, 2021 at 2:30p WHERE: [facility]   [id number] is a drive thru testing site, you will remain in your car. Be  sure to get in the line FOR PROCEDURES Once you have been swabbed you will need to remain home in quarantine until you return for your procedure.   4. Medication instructions in preparation for your procedure:   Contrast Allergy: No   HOLD All morning medications on the morning of your procedure   Take only 16 units of insulin the night before your procedure. Do not take any insulin on the day of the procedure. (Half dose of your night dose)   Do not take Diabetes Med Glucophage (Metformin) on the day of the procedure and HOLD 48 HOURS AFTER THE PROCEDURE.   On the morning of your procedure, take your Aspirin.   5. Plan for one night stay--bring personal belongings. 6. Bring a current list of your medications and current insurance cards. 7. You MUST have a responsible person to drive you home. 8. Someone MUST be with you the first 24 hours after you arrive home or your discharge will be delayed. 9. Please wear clothes that are easy to get on and off and wear slip-on shoes.  [facility] you for [facility]!   -- [facility]

## 2019-06-21 NOTE — H&P (View-Only) (Signed)
PCP: Albina Billet, MD Cardiology: Dr. Fletcher Anon HF Cardiology: Dr. Aundra Dubin  74 y.o. with history of chronic hypoxemic respiratory failure, OSA, and chronic diastolic CHF was referred by Dr. Fletcher Anon for evaluation of CHF and pulmonary hypertension.  Patient has been on home oxygen for 2-3 years per her report.  She had a DVT in 2018.  Anticoagulation was stopped due to refractory anemia and IVC filter was placed.  CT chest did not show a PE. She has OSA and uses CPAP.  Last echo in 1/21 showed normal LV EF with PASP 80 and severely dilated RV with mild RV dysfunction and D-shaped septum. She has been on Lasix for diuresis due to RV failure.    She reports shortness of breath for about 3 years.  She says that no one has ever told her why she has to use oxygen.  She uses oxygen all the time now.  She is short of breath walking in the house, short of breath with showering and dressing.  No lightheadedness or dizziness.  No chest pain.  No orthopnea/PND.    ECG (personally reviewed, 1/21): NSR, LPFB  Labs (12/20): BNP 819 Labs (2/21): K 4.4, creatinine 0.72  PMH: 1. COVID-19 PNA 8/20.  2. Type 2 diabetes 3. DVT: right leg, 2018.  - Has IVC filter, not anticoagulated due to h/o anemia.  4. HTN 5. Hyperlipidemia.  6. OSA: Uses Bipap.  7. Chronic hypoxemic respiratory failure: 2-3 L home oxygen.  8. Hyponatremia on HCTZ 9. Chronic diastolic CHF:  - Lexiscan Cardiolite in 2/19 was low risk no ischemia.  - Echo (1/21): EF 65-70%, no LVH, trivial MR, PASP 80 mmHg, severe RV enlargement with mildly decreased RV systolic function, D-shaped interventricular septum, dilated IVC.  - CTA chest (8/18): No evidence for PE.   Social History   Socioeconomic History  . Marital status: Single    Spouse name: Not on file  . Number of children: Not on file  . Years of education: Not on file  . Highest education level: Not on file  Occupational History  . Occupation: CELL    Employer: RETIRED  Tobacco Use  .  Smoking status: Former Smoker    Packs/day: 1.50    Years: 20.00    Pack years: 30.00    Quit date: 1999    Years since quitting: 22.2  . Smokeless tobacco: Never Used  Substance and Sexual Activity  . Alcohol use: No    Alcohol/week: 0.0 standard drinks  . Drug use: No  . Sexual activity: Not Currently  Other Topics Concern  . Not on file  Social History Narrative   Independent at baseline. Lives at home with her brother   Social Determinants of Health   Financial Resource Strain:   . Difficulty of Paying Living Expenses: Not on file  Food Insecurity:   . Worried About Charity fundraiser in the Last Year: Not on file  . Ran Out of Food in the Last Year: Not on file  Transportation Needs:   . Lack of Transportation (Medical): Not on file  . Lack of Transportation (Non-Medical): Not on file  Physical Activity:   . Days of Exercise per Week: Not on file  . Minutes of Exercise per Session: Not on file  Stress:   . Feeling of Stress : Not on file  Social Connections:   . Frequency of Communication with Friends and Family: Not on file  . Frequency of Social Gatherings with Friends and Family:  Not on file  . Attends Religious Services: Not on file  . Active Member of Clubs or Organizations: Not on file  . Attends Archivist Meetings: Not on file  . Marital Status: Not on file  Intimate Partner Violence:   . Fear of Current or Ex-Partner: Not on file  . Emotionally Abused: Not on file  . Physically Abused: Not on file  . Sexually Abused: Not on file   Family History  Problem Relation Age of Onset  . Prostate cancer Father   . Other Sister        mouth cancer  . Hypertension Mother    ROS: All systems reviewed and negative except as per HPI.   Current Outpatient Medications  Medication Sig Dispense Refill  . acetaminophen (TYLENOL) 325 MG tablet Take 2 tablets (650 mg total) by mouth every 6 (six) hours as needed for mild pain (or Fever >/= 101).    Marland Kitchen  aspirin 81 MG chewable tablet Chew 1 tablet (81 mg total) by mouth daily. 30 tablet 0  . ferrous gluconate (FERGON) 325 MG tablet Take 325 mg by mouth daily.    . furosemide (LASIX) 20 MG tablet Take 2 tablets (40 mg total) by mouth every morning AND 1 tablet (20 mg total) every evening. 90 tablet 5  . HUMALOG KWIKPEN 100 UNIT/ML KiwkPen Inject 10 Units into the skin 2 (two) times daily.     . insulin glargine (LANTUS) 100 UNIT/ML injection Inject 32 Units into the skin daily.     Marland Kitchen latanoprost (XALATAN) 0.005 % ophthalmic solution 1 drop at bedtime.    . lovastatin (MEVACOR) 20 MG tablet Take 20 mg by mouth at bedtime.    . metFORMIN (GLUCOPHAGE) 1000 MG tablet Take 1,000 mg by mouth daily.     . Multiple Vitamin (MULTIVITAMIN) tablet Take 1 tablet by mouth daily.    Marland Kitchen omeprazole (PRILOSEC) 40 MG capsule Take 1 capsule (40 mg total) by mouth daily before breakfast. 30 capsule 0  . timolol (TIMOPTIC) 0.25 % ophthalmic solution Place 1 drop into both eyes 2 (two) times daily.     . dapagliflozin propanediol (FARXIGA) 10 MG TABS tablet Take 10 mg by mouth daily before breakfast. 30 tablet 6  . potassium chloride SA (KLOR-CON) 20 MEQ tablet Take 1 tablet (20 mEq total) by mouth daily. 90 tablet 3   No current facility-administered medications for this encounter.   BP 130/82   Pulse 89   Wt 100.8 kg (222 lb 3.2 oz)   SpO2 95% Comment: 3L of oxygen  BMI 35.86 kg/m  General: NAD Neck: JVP 14 cm, no thyromegaly or thyroid nodule.  Lungs: Slight crackles at bases.  CV: Nondisplaced PMI.  Heart regular S1/S2, no S3/S4, no murmur.  2+ edema to knees bilaterally.  No carotid bruit.  Normal pedal pulses.  Abdomen: Soft, nontender, no hepatosplenomegaly, no distention.  Skin: Intact without lesions or rashes.  Neurologic: Alert and oriented x 3.  Psych: Normal affect. Extremities: No clubbing or cyanosis.  HEENT: Normal.   Assessment/Plan: 1. Chronic diastolic CHF/RV failure: Echo in 1/21 showed  significant RV dysfunction with severe pulmonary hypertension and D-shaped interventricular septum. She has quite significant RV failure on exam.  Question here is whether the RV failure is caused by pulmonary hypertension the absence of left heart disease or whether there is significant LV diastolic failure and pulmonary venous hypertension. NYHA class III symptoms with volume overload.  - Increase Lasix to 40 mg bid  x 3 days then 40 qam/20 qpm.  Increase KCl to 20 bid. BMET today and in 10 days.  - RHC/LHC will be planned to differential pulmonary arterial and pulmonary venous hypertension as well as assess filling pressures and cardiac output.  I will also do coronary angiography given significant exertional symptoms.  We discussed risks/benefits and she agrees to the procedure.  2. Pulmonary HTN: Echo in 1/21 with RV failure and estimate PA systolic pressure 80 mmHg.  As above, major question is whether this is pulmonary arterial or pulmonary venous hypertension primarily.  - RHC as above.  - I will arrange for V/Q scan to look for chronic PE.  - I will arrange for high resolution chest CT to look for ILD given chronic hypoxemic respiratory failure.  - I will arrange for PFTs.  - Serologic PH workup with ANA, RF, anti-Jo1, anti-SCL70.  - Continue to use Bipap at night.  3. H/o DVT: 2018.  She has IVC filter and is not anticoagulated due to h/o anemia.  - As above, I will get V/Q scan to look for evidence of chronic PE.  4. Type 2 diabetes: Given significant volume overload, stop Actos and start [place] or Jardiance at 10 mg daily.  5. Chronic hypoxemic respiratory failure: ?Due to LV diastolic dysfunction or pulmonary hypertension.  No known lung parenchymal disease.  - Workup as above with PFTs and high resolution CT chest as well as V/Q scan.   Followup in 3 wks.   Loralie Champagne 06/21/2019

## 2019-06-21 NOTE — Progress Notes (Signed)
PCP: Tate, Denny C, MD Cardiology: Dr. Arida HF Cardiology: Dr. Rebeckah Masih  74 y.o. with history of chronic hypoxemic respiratory failure, OSA, and chronic diastolic CHF was referred by Dr. Arida for evaluation of CHF and pulmonary hypertension.  Patient has been on home oxygen for 2-3 years per her report.  She had a DVT in 2018.  Anticoagulation was stopped due to refractory anemia and IVC filter was placed.  CT chest did not show a PE. She has OSA and uses CPAP.  Last echo in 1/21 showed normal LV EF with PASP 80 and severely dilated RV with mild RV dysfunction and D-shaped septum. She has been on Lasix for diuresis due to RV failure.    She reports shortness of breath for about 3 years.  She says that no one has ever told her why she has to use oxygen.  She uses oxygen all the time now.  She is short of breath walking in the house, short of breath with showering and dressing.  No lightheadedness or dizziness.  No chest pain.  No orthopnea/PND.    ECG (personally reviewed, 1/21): NSR, LPFB  Labs (12/20): BNP 819 Labs (2/21): K 4.4, creatinine 0.72  PMH: 1. COVID-19 PNA 8/20.  2. Type 2 diabetes 3. DVT: right leg, 2018.  - Has IVC filter, not anticoagulated due to h/o anemia.  4. HTN 5. Hyperlipidemia.  6. OSA: Uses Bipap.  7. Chronic hypoxemic respiratory failure: 2-3 L home oxygen.  8. Hyponatremia on HCTZ 9. Chronic diastolic CHF:  - Lexiscan Cardiolite in 2/19 was low risk no ischemia.  - Echo (1/21): EF 65-70%, no LVH, trivial MR, PASP 80 mmHg, severe RV enlargement with mildly decreased RV systolic function, D-shaped interventricular septum, dilated IVC.  - CTA chest (8/18): No evidence for PE.   Social History   Socioeconomic History  . Marital status: Single    Spouse name: Not on file  . Number of children: Not on file  . Years of education: Not on file  . Highest education level: Not on file  Occupational History  . Occupation: CELL    Employer: RETIRED  Tobacco Use  .  Smoking status: Former Smoker    Packs/day: 1.50    Years: 20.00    Pack years: 30.00    Quit date: 1999    Years since quitting: 22.2  . Smokeless tobacco: Never Used  Substance and Sexual Activity  . Alcohol use: No    Alcohol/week: 0.0 standard drinks  . Drug use: No  . Sexual activity: Not Currently  Other Topics Concern  . Not on file  Social History Narrative   Independent at baseline. Lives at home with her brother   Social Determinants of Health   Financial Resource Strain:   . Difficulty of Paying Living Expenses: Not on file  Food Insecurity:   . Worried About Running Out of Food in the Last Year: Not on file  . Ran Out of Food in the Last Year: Not on file  Transportation Needs:   . Lack of Transportation (Medical): Not on file  . Lack of Transportation (Non-Medical): Not on file  Physical Activity:   . Days of Exercise per Week: Not on file  . Minutes of Exercise per Session: Not on file  Stress:   . Feeling of Stress : Not on file  Social Connections:   . Frequency of Communication with Friends and Family: Not on file  . Frequency of Social Gatherings with Friends and Family:   Not on file  . Attends Religious Services: Not on file  . Active Member of Clubs or Organizations: Not on file  . Attends Club or Organization Meetings: Not on file  . Marital Status: Not on file  Intimate Partner Violence:   . Fear of Current or Ex-Partner: Not on file  . Emotionally Abused: Not on file  . Physically Abused: Not on file  . Sexually Abused: Not on file   Family History  Problem Relation Age of Onset  . Prostate cancer Father   . Other Sister        mouth cancer  . Hypertension Mother    ROS: All systems reviewed and negative except as per HPI.   Current Outpatient Medications  Medication Sig Dispense Refill  . acetaminophen (TYLENOL) 325 MG tablet Take 2 tablets (650 mg total) by mouth every 6 (six) hours as needed for mild pain (or Fever >/= 101).    .  aspirin 81 MG chewable tablet Chew 1 tablet (81 mg total) by mouth daily. 30 tablet 0  . ferrous gluconate (FERGON) 325 MG tablet Take 325 mg by mouth daily.    . furosemide (LASIX) 20 MG tablet Take 2 tablets (40 mg total) by mouth every morning AND 1 tablet (20 mg total) every evening. 90 tablet 5  . HUMALOG KWIKPEN 100 UNIT/ML KiwkPen Inject 10 Units into the skin 2 (two) times daily.     . insulin glargine (LANTUS) 100 UNIT/ML injection Inject 32 Units into the skin daily.     . latanoprost (XALATAN) 0.005 % ophthalmic solution 1 drop at bedtime.    . lovastatin (MEVACOR) 20 MG tablet Take 20 mg by mouth at bedtime.    . metFORMIN (GLUCOPHAGE) 1000 MG tablet Take 1,000 mg by mouth daily.     . Multiple Vitamin (MULTIVITAMIN) tablet Take 1 tablet by mouth daily.    . omeprazole (PRILOSEC) 40 MG capsule Take 1 capsule (40 mg total) by mouth daily before breakfast. 30 capsule 0  . timolol (TIMOPTIC) 0.25 % ophthalmic solution Place 1 drop into both eyes 2 (two) times daily.     . dapagliflozin propanediol (FARXIGA) 10 MG TABS tablet Take 10 mg by mouth daily before breakfast. 30 tablet 6  . potassium chloride SA (KLOR-CON) 20 MEQ tablet Take 1 tablet (20 mEq total) by mouth daily. 90 tablet 3   No current facility-administered medications for this encounter.   BP 130/82   Pulse 89   Wt 100.8 kg (222 lb 3.2 oz)   SpO2 95% Comment: 3L of oxygen  BMI 35.86 kg/m  General: NAD Neck: JVP 14 cm, no thyromegaly or thyroid nodule.  Lungs: Slight crackles at bases.  CV: Nondisplaced PMI.  Heart regular S1/S2, no S3/S4, no murmur.  2+ edema to knees bilaterally.  No carotid bruit.  Normal pedal pulses.  Abdomen: Soft, nontender, no hepatosplenomegaly, no distention.  Skin: Intact without lesions or rashes.  Neurologic: Alert and oriented x 3.  Psych: Normal affect. Extremities: No clubbing or cyanosis.  HEENT: Normal.   Assessment/Plan: 1. Chronic diastolic CHF/RV failure: Echo in 1/21 showed  significant RV dysfunction with severe pulmonary hypertension and D-shaped interventricular septum. She has quite significant RV failure on exam.  Question here is whether the RV failure is caused by pulmonary hypertension the absence of left heart disease or whether there is significant LV diastolic failure and pulmonary venous hypertension. NYHA class III symptoms with volume overload.  - Increase Lasix to 40 mg bid   x 3 days then 40 qam/20 qpm.  Increase KCl to 20 bid. BMET today and in 10 days.  - RHC/LHC will be planned to differential pulmonary arterial and pulmonary venous hypertension as well as assess filling pressures and cardiac output.  I will also do coronary angiography given significant exertional symptoms.  We discussed risks/benefits and she agrees to the procedure.  2. Pulmonary HTN: Echo in 1/21 with RV failure and estimate PA systolic pressure 80 mmHg.  As above, major question is whether this is pulmonary arterial or pulmonary venous hypertension primarily.  - RHC as above.  - I will arrange for V/Q scan to look for chronic PE.  - I will arrange for high resolution chest CT to look for ILD given chronic hypoxemic respiratory failure.  - I will arrange for PFTs.  - Serologic PH workup with ANA, RF, anti-Jo1, anti-SCL70.  - Continue to use Bipap at night.  3. H/o DVT: 2018.  She has IVC filter and is not anticoagulated due to h/o anemia.  - As above, I will get V/Q scan to look for evidence of chronic PE.  4. Type 2 diabetes: Given significant volume overload, stop Actos and start [place] or Jardiance at 10 mg daily.  5. Chronic hypoxemic respiratory failure: ?Due to LV diastolic dysfunction or pulmonary hypertension.  No known lung parenchymal disease.  - Workup as above with PFTs and high resolution CT chest as well as V/Q scan.   Followup in 3 wks.   Loralie Champagne 06/21/2019

## 2019-06-22 ENCOUNTER — Other Ambulatory Visit (HOSPITAL_COMMUNITY): Payer: Self-pay

## 2019-06-22 DIAGNOSIS — I272 Pulmonary hypertension, unspecified: Secondary | ICD-10-CM

## 2019-06-22 LAB — ANTI-JO 1 ANTIBODY, IGG: Anti JO-1: <0.2 AI (ref 0.0–0.9)

## 2019-06-22 LAB — ANTI-SCLERODERMA ANTIBODY: Scleroderma (Scl-70) (ENA) Antibody, IgG: <0.2 AI (ref 0.0–0.9)

## 2019-06-22 LAB — ANA: Anti Nuclear Antibody (ANA): NEGATIVE

## 2019-06-23 LAB — RHEUMATOID FACTOR: Rheumatoid fact SerPl-aCnc: 11.5 [IU]/mL (ref 0.0–13.9)

## 2019-06-24 ENCOUNTER — Other Ambulatory Visit (HOSPITAL_COMMUNITY): Payer: Self-pay

## 2019-06-24 DIAGNOSIS — I272 Pulmonary hypertension, unspecified: Secondary | ICD-10-CM

## 2019-06-27 ENCOUNTER — Ambulatory Visit: Payer: Medicare Other

## 2019-06-27 ENCOUNTER — Other Ambulatory Visit
Admission: RE | Admit: 2019-06-27 | Discharge: 2019-06-27 | Disposition: A | Discharge status: Home / Self Care | Payer: Medicare Other | Source: Ambulatory Visit | Attending: Cardiology | Admitting: Cardiology

## 2019-06-27 DIAGNOSIS — Z01812 Encounter for preprocedural laboratory examination: Secondary | ICD-10-CM | POA: Diagnosis present

## 2019-06-27 DIAGNOSIS — Z20822 Contact with and (suspected) exposure to covid-19: Secondary | ICD-10-CM | POA: Insufficient documentation

## 2019-06-27 DIAGNOSIS — Z23 Encounter for immunization: Secondary | ICD-10-CM

## 2019-06-27 LAB — SARS CORONAVIRUS 2 (TAT 6-24 HRS): SARS Coronavirus 2: NEGATIVE

## 2019-06-27 NOTE — Progress Notes (Signed)
   Covid-19 Vaccination Clinic  Name:  Susan Barajas    MRN: [id number] DOB: 08-15-1945  06/27/2019  [name] was observed post Covid-19 immunization for 15 minutes without incident. She was provided with Vaccine Information Sheet and instruction to access the V-Safe system.   [name] was instructed to call 911 with any severe reactions post vaccine: Marland Kitchen Difficulty breathing  . Swelling of face and throat  . A fast heartbeat  . A bad rash all over body  . Dizziness and weakness   Immunizations Administered    Name Date Dose VIS Date Route   Moderna COVID-19 Vaccine 06/27/2019 12:49 PM 0.5 mL 03/15/2019 Intramuscular   Manufacturer: Moderna   Lot: [id number]   [place]: [phone]

## 2019-06-30 ENCOUNTER — Ambulatory Visit (HOSPITAL_COMMUNITY)
Admission: RE | Admit: 2019-06-30 | Discharge: 2019-06-30 | Disposition: A | Discharge status: Home / Self Care | Payer: Medicare Other | Attending: Cardiology | Admitting: Cardiology

## 2019-06-30 ENCOUNTER — Encounter (HOSPITAL_COMMUNITY)
Admission: RE | Disposition: A | Discharge status: Home / Self Care | Payer: Self-pay | Source: Home / Self Care | Attending: Cardiology

## 2019-06-30 ENCOUNTER — Other Ambulatory Visit: Payer: Self-pay

## 2019-06-30 DIAGNOSIS — Z87891 Personal history of nicotine dependence: Secondary | ICD-10-CM | POA: Insufficient documentation

## 2019-06-30 DIAGNOSIS — Z7982 Long term (current) use of aspirin: Secondary | ICD-10-CM | POA: Diagnosis not present

## 2019-06-30 DIAGNOSIS — I110 Hypertensive heart disease with heart failure: Secondary | ICD-10-CM | POA: Insufficient documentation

## 2019-06-30 DIAGNOSIS — E119 Type 2 diabetes mellitus without complications: Secondary | ICD-10-CM | POA: Insufficient documentation

## 2019-06-30 DIAGNOSIS — I2510 Atherosclerotic heart disease of native coronary artery without angina pectoris: Secondary | ICD-10-CM | POA: Diagnosis not present

## 2019-06-30 DIAGNOSIS — R0600 Dyspnea, unspecified: Secondary | ICD-10-CM | POA: Diagnosis not present

## 2019-06-30 DIAGNOSIS — E785 Hyperlipidemia, unspecified: Secondary | ICD-10-CM | POA: Diagnosis not present

## 2019-06-30 DIAGNOSIS — Z8616 Personal history of COVID-19: Secondary | ICD-10-CM | POA: Diagnosis not present

## 2019-06-30 DIAGNOSIS — I2721 Secondary pulmonary arterial hypertension: Secondary | ICD-10-CM | POA: Insufficient documentation

## 2019-06-30 DIAGNOSIS — Z86718 Personal history of other venous thrombosis and embolism: Secondary | ICD-10-CM | POA: Diagnosis not present

## 2019-06-30 DIAGNOSIS — G4733 Obstructive sleep apnea (adult) (pediatric): Secondary | ICD-10-CM | POA: Diagnosis not present

## 2019-06-30 DIAGNOSIS — J9611 Chronic respiratory failure with hypoxia: Secondary | ICD-10-CM | POA: Diagnosis not present

## 2019-06-30 DIAGNOSIS — I272 Pulmonary hypertension, unspecified: Secondary | ICD-10-CM | POA: Diagnosis not present

## 2019-06-30 DIAGNOSIS — Z794 Long term (current) use of insulin: Secondary | ICD-10-CM | POA: Diagnosis not present

## 2019-06-30 DIAGNOSIS — I5032 Chronic diastolic (congestive) heart failure: Secondary | ICD-10-CM | POA: Insufficient documentation

## 2019-06-30 DIAGNOSIS — Z79899 Other long term (current) drug therapy: Secondary | ICD-10-CM | POA: Insufficient documentation

## 2019-06-30 DIAGNOSIS — Z8249 Family history of ischemic heart disease and other diseases of the circulatory system: Secondary | ICD-10-CM | POA: Insufficient documentation

## 2019-06-30 HISTORY — PX: RIGHT/LEFT HEART CATH AND CORONARY ANGIOGRAPHY: CATH118266

## 2019-06-30 LAB — BASIC METABOLIC PANEL
Anion gap: 11 (ref 5–15)
BUN: 18 mg/dL (ref 8–23)
CO2: 26 mmol/L (ref 22–32)
Calcium: 9.1 mg/dL (ref 8.9–10.3)
Chloride: 104 mmol/L (ref 98–111)
Creatinine, Ser: 0.86 mg/dL (ref 0.44–1.00)
GFR calc Af Amer: >60 mL/min (ref >60)
GFR calc non Af Amer: >60 mL/min (ref >60)
Glucose, Bld: 135 mg/dL — ABNORMAL HIGH (ref 70–99)
Potassium: 4.1 mmol/L (ref 3.5–5.1)
Sodium: 141 mmol/L (ref 135–145)

## 2019-06-30 LAB — GLUCOSE, CAPILLARY
Glucose-Capillary: 144 mg/dL — ABNORMAL HIGH (ref 70–99)
Glucose-Capillary: 113 mg/dL — ABNORMAL HIGH (ref 70–99)

## 2019-06-30 SURGERY — RIGHT/LEFT HEART CATH AND CORONARY ANGIOGRAPHY
Anesthesia: LOCAL

## 2019-06-30 MED ORDER — MIDAZOLAM HCL 2 MG/2ML IJ SOLN
INTRAMUSCULAR | Status: DC | PRN
  Administered 2019-06-30: 1 mg via INTRAVENOUS

## 2019-06-30 MED ORDER — SODIUM CHLORIDE 0.9% FLUSH: 3.0000 mL | Freq: Two times a day (BID) | INTRAVENOUS | Status: DC

## 2019-06-30 MED ORDER — HEPARIN (PORCINE) IN NACL 1000-0.9 UT/500ML-% IV SOLN
INTRAVENOUS | Status: AC
  Filled 2019-06-30: qty 1000

## 2019-06-30 MED ORDER — MIDAZOLAM HCL 2 MG/2ML IJ SOLN
INTRAMUSCULAR | Status: AC
  Filled 2019-06-30: qty 2

## 2019-06-30 MED ORDER — LIDOCAINE HCL (PF) 1 % IJ SOLN
INTRAMUSCULAR | Status: DC | PRN
  Administered 2019-06-30: 1 mL
  Administered 2019-06-30: 2 mL

## 2019-06-30 MED ORDER — HEPARIN (PORCINE) IN NACL 1000-0.9 UT/500ML-% IV SOLN
INTRAVENOUS | Status: DC | PRN
  Administered 2019-06-30: 500 mL

## 2019-06-30 MED ORDER — IOHEXOL 350 MG/ML SOLN
INTRAVENOUS | Status: DC | PRN
  Administered 2019-06-30: 35 mL

## 2019-06-30 MED ORDER — VERAPAMIL HCL 2.5 MG/ML IV SOLN
INTRAVENOUS | Status: AC
  Filled 2019-06-30: qty 2

## 2019-06-30 MED ORDER — ASPIRIN 81 MG PO CHEW
81.0000 mg | CHEWABLE_TABLET | Freq: Once | ORAL | Status: AC
  Administered 2019-06-30: 81 mg via ORAL

## 2019-06-30 MED ORDER — FENTANYL CITRATE (PF) 100 MCG/2ML IJ SOLN
INTRAMUSCULAR | Status: AC
  Filled 2019-06-30: qty 2

## 2019-06-30 MED ORDER — SODIUM CHLORIDE 0.9% FLUSH: 3.0000 mL | INTRAVENOUS | Status: DC | PRN

## 2019-06-30 MED ORDER — SODIUM CHLORIDE 0.9 % IV SOLN: 250.0000 mL | INTRAVENOUS | Status: DC | PRN

## 2019-06-30 MED ORDER — LABETALOL HCL 5 MG/ML IV SOLN: 10.0000 mg | INTRAVENOUS | Status: DC | PRN

## 2019-06-30 MED ORDER — FENTANYL CITRATE (PF) 100 MCG/2ML IJ SOLN
INTRAMUSCULAR | Status: DC | PRN
  Administered 2019-06-30: 25 ug via INTRAVENOUS

## 2019-06-30 MED ORDER — VERAPAMIL HCL 2.5 MG/ML IV SOLN
INTRAVENOUS | Status: DC | PRN
  Administered 2019-06-30: 10 mL via INTRA_ARTERIAL

## 2019-06-30 MED ORDER — ASPIRIN 81 MG PO CHEW
CHEWABLE_TABLET | ORAL | Status: AC
  Administered 2019-06-30: 81 mg
  Filled 2019-06-30: qty 1

## 2019-06-30 MED ORDER — ONDANSETRON HCL 4 MG/2ML IJ SOLN: 4.0000 mg | Freq: Four times a day (QID) | INTRAMUSCULAR | Status: DC | PRN

## 2019-06-30 MED ORDER — FUROSEMIDE 20 MG PO TABS: 40.0000 mg | ORAL_TABLET | Freq: Two times a day (BID) | ORAL | 5 refills | Status: DC

## 2019-06-30 MED ORDER — SODIUM CHLORIDE 0.9 % IV SOLN: INTRAVENOUS | Status: DC

## 2019-06-30 MED ORDER — HEPARIN SODIUM (PORCINE) 1000 UNIT/ML IJ SOLN
INTRAMUSCULAR | Status: DC | PRN
  Administered 2019-06-30: 4500 [IU] via INTRAVENOUS

## 2019-06-30 MED ORDER — ACETAMINOPHEN 325 MG PO TABS: 650.0000 mg | ORAL_TABLET | ORAL | Status: DC | PRN

## 2019-06-30 MED ORDER — LIDOCAINE HCL (PF) 1 % IJ SOLN
INTRAMUSCULAR | Status: AC
  Filled 2019-06-30: qty 30

## 2019-06-30 MED ORDER — HYDRALAZINE HCL 20 MG/ML IJ SOLN: 10.0000 mg | INTRAMUSCULAR | Status: DC | PRN

## 2019-06-30 SURGICAL SUPPLY — 13 items
CATH 5FR JL3.5 JR4 ANG PIG MP (CATHETERS) ×2 IMPLANT
CATH BALLN WEDGE 5F 110CM (CATHETERS) ×2 IMPLANT
DEVICE RAD COMP TR BAND LRG (VASCULAR PRODUCTS) ×2 IMPLANT
GLIDESHEATH SLEND SS 6F .021 (SHEATH) ×2 IMPLANT
GUIDEWIRE INQWIRE 1.5J.035X260 (WIRE) ×1 IMPLANT
INQWIRE 1.5J .035X260CM (WIRE) ×2
KIT HEART LEFT (KITS) ×2 IMPLANT
PACK CARDIAC CATHETERIZATION (CUSTOM PROCEDURE TRAY) ×2 IMPLANT
SHEATH GLIDE SLENDER 4/5FR (SHEATH) ×2 IMPLANT
TRANSDUCER W/STOPCOCK (MISCELLANEOUS) ×2 IMPLANT
TUBING ART PRESS 72  MALE/FEM (TUBING) ×1
TUBING ART PRESS 72 MALE/FEM (TUBING) ×1 IMPLANT
WIRE EMERALD 3MM-J .025X260CM (WIRE) ×2 IMPLANT

## 2019-06-30 NOTE — Interval H&P Note (Signed)
History and Physical Interval Note:  06/30/2019 12:31 PM  Susan Barajas  has presented today for surgery, with the diagnosis of hp.  The various methods of treatment have been discussed with the patient and family. After consideration of risks, benefits and other options for treatment, the patient has consented to  Procedure(s): RIGHT/LEFT HEART CATH AND CORONARY ANGIOGRAPHY (N/A) as a surgical intervention.  The patient's history has been reviewed, patient examined, no change in status, stable for surgery.  I have reviewed the patient's chart and labs.  Questions were answered to the patient's satisfaction.     Breella Vanostrand [employer]

## 2019-06-30 NOTE — Discharge Instructions (Signed)
Increase furosemide to 40 mg twice a day. Drink plenty of fluid for 48 hours and keep wrist elevated at heart level for 24 hours  Radial Site Care   This sheet gives you information about how to care for yourself after your procedure. Your health care provider may also give you more specific instructions. If you have problems or questions, contact your health care provider. What can I expect after the procedure? After the procedure, it is common to have:  Bruising and tenderness at the catheter insertion area. Follow these instructions at home: Medicines  Take over-the-counter and prescription medicines only as told by your health care provider. Insertion site care 1. Follow instructions from your health care provider about how to take care of your insertion site. Make sure you: ? Wash your hands with soap and water before you change your bandage (dressing). If soap and water are not available, use hand sanitizer. ? remove your dressing as told by your health care provider. In 24 hours 2. Check your insertion site every day for signs of infection. Check for: ? Redness, swelling, or pain. ? Fluid or blood. ? Pus or a bad smell. ? Warmth. 3. Do not take baths, swim, or use a hot tub until your health care provider approves. 4. You may shower 24-48 hours after the procedure, or as directed by your health care provider. ? Remove the dressing and gently wash the site with plain soap and water. ? Pat the area dry with a clean towel. ? Do not rub the site. That could cause bleeding. 5. Do not apply powder or lotion to the site. Activity   1. For 24 hours after the procedure, or as directed by your health care provider: ? Do not flex or bend the affected arm. ? Do not push or pull heavy objects with the affected arm. ? Do not drive yourself home from the hospital or clinic. You may drive 24 hours after the procedure unless your health care provider tells you not to. ? Do not operate  machinery or power tools. 2. Do not lift anything that is heavier than 10 lb (4.5 kg), or the limit that you are told, until your health care provider says that it is safe. For 4 days 3. Ask your health care provider when it is okay to: ? Return to work or school. ? Resume usual physical activities or sports. ? Resume sexual activity. General instructions  If the catheter site starts to bleed, raise your arm and put firm pressure on the site. If the bleeding does not stop, get help right away. This is a medical emergency.  If you went home on the same day as your procedure, a responsible adult should be with you for the first 24 hours after you arrive home.  Keep all follow-up visits as told by your health care provider. This is important. Contact a health care provider if:  You have a fever.  You have redness, swelling, or yellow drainage around your insertion site. Get help right away if:  You have unusual pain at the radial site.  The catheter insertion area swells very fast.  The insertion area is bleeding, and the bleeding does not stop when you hold steady pressure on the area.  Your arm or hand becomes pale, cool, tingly, or numb. These symptoms may represent a serious problem that is an emergency. Do not wait to see if the symptoms will go away. Get medical help right away. Call your local  emergency services (911 in the [place] Do not drive yourself to the hospital. Summary  After the procedure, it is common to have bruising and tenderness at the site.  Follow instructions from your health care provider about how to take care of your radial site wound. Check the wound every day for signs of infection.  Do not lift anything that is heavier than 10 lb (4.5 kg), or the limit that you are told, until your health care provider says that it is safe. This information is not intended to replace advice given to you by your health care provider. Make sure you discuss any questions you  have with your health care provider. Document Revised: 05/06/2017 Document Reviewed: 05/06/2017 Elsevier Patient Education  2020 [employer].

## 2019-06-30 NOTE — Progress Notes (Signed)
Discharge instructions given to patient and her son. Both verbalized understanding.

## 2019-07-01 ENCOUNTER — Other Ambulatory Visit (HOSPITAL_COMMUNITY): Payer: Medicare Other

## 2019-07-01 LAB — POCT I-STAT EG7
Acid-Base Excess: 2 mmol/L (ref 0.0–2.0)
Acid-Base Excess: 2 mmol/L (ref 0.0–2.0)
Acid-Base Excess: 2 mmol/L (ref 0.0–2.0)
Bicarbonate: 29.8 mmol/L — ABNORMAL HIGH (ref 20.0–28.0)
Bicarbonate: 29.9 mmol/L — ABNORMAL HIGH (ref 20.0–28.0)
Bicarbonate: 30.1 mmol/L — ABNORMAL HIGH (ref 20.0–28.0)
Calcium, Ion: 1.21 mmol/L (ref 1.15–1.40)
Calcium, Ion: 1.24 mmol/L (ref 1.15–1.40)
Calcium, Ion: 1.29 mmol/L (ref 1.15–1.40)
HCT: 36 % (ref 36.0–46.0)
HCT: 37 % (ref 36.0–46.0)
HCT: 37 % (ref 36.0–46.0)
Hemoglobin: 12.2 g/dL (ref 12.0–15.0)
Hemoglobin: 12.6 g/dL (ref 12.0–15.0)
Hemoglobin: 12.6 g/dL (ref 12.0–15.0)
O2 Saturation: 71 %
O2 Saturation: 71 %
O2 Saturation: 74 %
Potassium: 4 mmol/L (ref 3.5–5.1)
Potassium: 4 mmol/L (ref 3.5–5.1)
Potassium: 4.1 mmol/L (ref 3.5–5.1)
Sodium: 141 mmol/L (ref 135–145)
Sodium: 142 mmol/L (ref 135–145)
Sodium: 142 mmol/L (ref 135–145)
TCO2: 32 mmol/L (ref 22–32)
TCO2: 32 mmol/L (ref 22–32)
TCO2: 32 mmol/L (ref 22–32)
pCO2, Ven: 59.7 mmHg (ref 44.0–60.0)
pCO2, Ven: 59.7 mmHg (ref 44.0–60.0)
pCO2, Ven: 60.5 mmHg — ABNORMAL HIGH (ref 44.0–60.0)
pH, Ven: 7.305 (ref 7.250–7.430)
pH, Ven: 7.306 (ref 7.250–7.430)
pH, Ven: 7.307 (ref 7.250–7.430)
pO2, Ven: 42 mmHg (ref 32.0–45.0)
pO2, Ven: 42 mmHg (ref 32.0–45.0)
pO2, Ven: 44 mmHg (ref 32.0–45.0)

## 2019-07-06 ENCOUNTER — Telehealth (HOSPITAL_COMMUNITY): Payer: Self-pay

## 2019-07-06 NOTE — Telephone Encounter (Signed)

## 2019-07-07 ENCOUNTER — Encounter (HOSPITAL_COMMUNITY): Payer: Self-pay | Admitting: Cardiology

## 2019-07-07 ENCOUNTER — Other Ambulatory Visit: Payer: Self-pay

## 2019-07-07 ENCOUNTER — Telehealth (HOSPITAL_COMMUNITY): Payer: Self-pay | Admitting: Pharmacist

## 2019-07-07 ENCOUNTER — Ambulatory Visit (HOSPITAL_COMMUNITY)
Admission: RE | Admit: 2019-07-07 | Discharge: 2019-07-07 | Disposition: A | Discharge status: Home / Self Care | Payer: Medicare Other | Source: Ambulatory Visit | Attending: Cardiology | Admitting: Cardiology

## 2019-07-07 VITALS — BP 130/62 | HR 80 | Wt 197.0 lb

## 2019-07-07 DIAGNOSIS — I272 Pulmonary hypertension, unspecified: Secondary | ICD-10-CM | POA: Diagnosis not present

## 2019-07-07 DIAGNOSIS — Z8616 Personal history of COVID-19: Secondary | ICD-10-CM | POA: Insufficient documentation

## 2019-07-07 DIAGNOSIS — I2721 Secondary pulmonary arterial hypertension: Secondary | ICD-10-CM | POA: Insufficient documentation

## 2019-07-07 DIAGNOSIS — Z794 Long term (current) use of insulin: Secondary | ICD-10-CM | POA: Diagnosis not present

## 2019-07-07 DIAGNOSIS — I50810 Right heart failure, unspecified: Secondary | ICD-10-CM | POA: Diagnosis not present

## 2019-07-07 DIAGNOSIS — Z7982 Long term (current) use of aspirin: Secondary | ICD-10-CM | POA: Insufficient documentation

## 2019-07-07 DIAGNOSIS — J432 Centrilobular emphysema: Secondary | ICD-10-CM | POA: Insufficient documentation

## 2019-07-07 DIAGNOSIS — E785 Hyperlipidemia, unspecified: Secondary | ICD-10-CM | POA: Insufficient documentation

## 2019-07-07 DIAGNOSIS — I110 Hypertensive heart disease with heart failure: Secondary | ICD-10-CM | POA: Insufficient documentation

## 2019-07-07 DIAGNOSIS — G4733 Obstructive sleep apnea (adult) (pediatric): Secondary | ICD-10-CM | POA: Diagnosis not present

## 2019-07-07 DIAGNOSIS — J849 Interstitial pulmonary disease, unspecified: Secondary | ICD-10-CM | POA: Diagnosis not present

## 2019-07-07 DIAGNOSIS — I2510 Atherosclerotic heart disease of native coronary artery without angina pectoris: Secondary | ICD-10-CM | POA: Diagnosis not present

## 2019-07-07 DIAGNOSIS — I5032 Chronic diastolic (congestive) heart failure: Secondary | ICD-10-CM | POA: Diagnosis not present

## 2019-07-07 DIAGNOSIS — Z79899 Other long term (current) drug therapy: Secondary | ICD-10-CM | POA: Insufficient documentation

## 2019-07-07 DIAGNOSIS — I700 Atherosclerosis of aorta: Secondary | ICD-10-CM | POA: Insufficient documentation

## 2019-07-07 DIAGNOSIS — Z8249 Family history of ischemic heart disease and other diseases of the circulatory system: Secondary | ICD-10-CM | POA: Diagnosis not present

## 2019-07-07 DIAGNOSIS — Z87891 Personal history of nicotine dependence: Secondary | ICD-10-CM | POA: Diagnosis not present

## 2019-07-07 DIAGNOSIS — Z86718 Personal history of other venous thrombosis and embolism: Secondary | ICD-10-CM | POA: Insufficient documentation

## 2019-07-07 DIAGNOSIS — E119 Type 2 diabetes mellitus without complications: Secondary | ICD-10-CM | POA: Insufficient documentation

## 2019-07-07 LAB — BASIC METABOLIC PANEL
Anion gap: 10 (ref 5–15)
BUN: 14 mg/dL (ref 8–23)
CO2: 23 mmol/L (ref 22–32)
Calcium: 9.2 mg/dL (ref 8.9–10.3)
Chloride: 102 mmol/L (ref 98–111)
Creatinine, Ser: 0.75 mg/dL (ref 0.44–1.00)
GFR calc Af Amer: >60 mL/min (ref >60)
GFR calc non Af Amer: >60 mL/min (ref >60)
Glucose, Bld: 163 mg/dL — ABNORMAL HIGH (ref 70–99)
Potassium: 4.7 mmol/L (ref 3.5–5.1)
Sodium: 135 mmol/L (ref 135–145)

## 2019-07-07 LAB — BRAIN NATRIURETIC PEPTIDE: B Natriuretic Peptide: 186.9 pg/mL — ABNORMAL HIGH (ref 0.0–100.0)

## 2019-07-07 MED ORDER — MACITENTAN 10 MG PO TABS: 10.0000 mg | ORAL_TABLET | Freq: Every day | ORAL | 11 refills | Status: DC

## 2019-07-07 NOTE — Telephone Encounter (Signed)
Patient Advocate Encounter   Received notification from [employer] that prior authorization for Opsumit is required.   PA submitted on CoverMyMeds Key [id number] Status is pending   Will continue to follow.  Audry Riles, PharmD, BCPS, BCCP, CPP Heart Failure Clinic Pharmacist [phone]

## 2019-07-07 NOTE — Progress Notes (Signed)
Pt ambulated 182 meters. Starting HR 71 elevated to 120 while waling and starting O2 97% decreased to 79 while walking, utilizing 4L O2. Pt noted to be SOB and fatigued at 5 minute mark.  Ambulation discontinued at 5 minutes.  Pt recovered well. Denied dizziness

## 2019-07-07 NOTE — Telephone Encounter (Signed)
Advanced Heart Failure Patient Advocate Encounter  Prior Authorization for Opsumit has been approved.    PA# [id number]  Effective dates: 07/07/19 through 04/13/20  Audry Riles, PharmD, BCPS, BCCP, CPP Heart Failure Clinic Pharmacist [phone]

## 2019-07-07 NOTE — Patient Instructions (Signed)
START Opsumit 44m (1 tab) daily when approved  Your physician has recommended that you have a pulmonary function test. Pulmonary Function Tests are a group of tests that measure how well air moves in and out of your lungs.  You will get a call to schedule this appointment.  Labs today We will only contact you if something comes back abnormal or we need to make some changes. Otherwise no news is good news!  Your physician recommends that you schedule a follow-up appointment in: 6 weeks with Dr MAundra Dubin   Please call office at [phone] 2 if you have any questions or concerns.   At the [facility] you and your health needs are our priority. As part of our continuing mission to provide you with exceptional heart care, we have created designated Provider Care Teams. These Care Teams include your primary Cardiologist (physician) and Advanced Practice Providers (APPs- Physician Assistants and Nurse Practitioners) who all work together to provide you with the care you need, when you need it.   You may see any of the following providers on your designated Care Team at your next follow up: .Marland KitchenDr DGlori Bickers. Dr DLoralie Champagne. ADarrick Grinder NP . BLyda Jester PA . LAudry Riles PharmD   Please be sure to bring in all your medications bottles to every appointment.

## 2019-07-07 NOTE — Telephone Encounter (Signed)
Received notice that patient will be started on Opsumit. Sent in Patient Enrollment form and REMS form to [employer].   Application is pending, will continue to follow.  Audry Riles, PharmD, BCPS, BCCP, CPP Heart Failure Clinic Pharmacist [phone]

## 2019-07-07 NOTE — Progress Notes (Deleted)
Cardiology Office Note    Date:  07/07/2019   ID:  Susan Barajas, DOB 1946-03-26, MRN [id number]  PCP:  Albina Billet, MD  Cardiologist:  Kathlyn Sacramento, MD  Electrophysiologist:  None   Chief Complaint: ***  History of Present Illness:   Susan Barajas is a 74 y.o. female with history of ***  ***   Labs independently reviewed: ***  Past Medical History:  Diagnosis Date  . Asthma 1950  . Clotting disorder (HCC)    right leg  . Diabetes ([place]) Age 25   Type 2; insulin dependent  . Glaucoma 1949  . Hyperlipidemia   . Hypertension   . Obesity, unspecified   . Personal history of tobacco use, presenting hazards to health   . Varicose veins of lower extremities with other complications     Past Surgical History:  Procedure Laterality Date  . BREAST BIOPSY Right 1991  . BREAST BIOPSY Left 2013  . COLONOSCOPY  2010   Dr. Vira Agar  . COLONOSCOPY  Jan 2016   Dr Vira Agar  . ESOPHAGOGASTRODUODENOSCOPY N/A 11/26/2016   Procedure: ESOPHAGOGASTRODUODENOSCOPY (EGD);  Surgeon: Lin Landsman, MD;  Location: [facility] ENDOSCOPY;  Service: Gastroenterology;  Laterality: N/A;  . GIVENS CAPSULE STUDY N/A 11/26/2016   Procedure: GIVENS CAPSULE STUDY, if EGD negative, plan to drop capsule with scope if EGD negative;  Surgeon: Lin Landsman, MD;  Location: [facility] ENDOSCOPY;  Service: Gastroenterology;  Laterality: N/A;  . IVC FILTER INSERTION N/A 11/14/2016   Procedure: IVC Filter Insertion;  Surgeon: Katha Cabal, MD;  Location: [facility] CV LAB;  Service: Cardiovascular;  Laterality: N/A;  . RIGHT/LEFT HEART CATH AND CORONARY ANGIOGRAPHY N/A 06/30/2019   Procedure: RIGHT/LEFT HEART CATH AND CORONARY ANGIOGRAPHY;  Surgeon: Larey Dresser, MD;  Location: [facility] CV LAB;  Service: Cardiovascular;  Laterality: N/A;  . VEIN SURGERY Right 2006   Vein Closure Procedure; RF ablation of right GSV    Current Medications: No outpatient medications have been marked  as taking for the 07/11/19 encounter (Appointment) with Rise Mu, PA-C.    Allergies:   Patient has no known allergies.   Social History   Socioeconomic History  . Marital status: Single    Spouse name: Not on file  . Number of children: Not on file  . Years of education: Not on file  . Highest education level: Not on file  Occupational History  . Occupation: CELL    Employer: RETIRED  Tobacco Use  . Smoking status: Former Smoker    Packs/day: 1.50    Years: 20.00    Pack years: 30.00    Quit date: 1999    Years since quitting: 22.2  . Smokeless tobacco: Never Used  Substance and Sexual Activity  . Alcohol use: No    Alcohol/week: 0.0 standard drinks  . Drug use: No  . Sexual activity: Not Currently  Other Topics Concern  . Not on file  Social History Narrative   Independent at baseline. Lives at home with her brother   Social Determinants of Health   Financial Resource Strain:   . Difficulty of Paying Living Expenses:   Food Insecurity:   . Worried About Charity fundraiser in the Last Year:   . Arboriculturist in the Last Year:   Transportation Needs:   . Film/video editor (Medical):   Marland Kitchen Lack of Transportation (Non-Medical):   Physical Activity:   . Days of Exercise per Week:   .  Minutes of Exercise per Session:   Stress:   . Feeling of Stress :   Social Connections:   . Frequency of Communication with Friends and Family:   . Frequency of Social Gatherings with Friends and Family:   . Attends Religious Services:   . Active Member of Clubs or Organizations:   . Attends Archivist Meetings:   Marland Kitchen Marital Status:      Family History:  The patient's family history includes Hypertension in her mother; Other in her sister; Prostate cancer in her father.  ROS:   ROS   EKGs/Labs/Other Studies Reviewed:    Studies reviewed were summarized above. The additional studies were reviewed today:  R/LHC 06/30/2019: 1. Normal PCWP, elevated RA  pressure.  2. Moderate-severe pulmonary arterial hypertension.  3. Preserved cardiac output.  4. No evidence for L>R shunt.  5. Nonobstructive mild CAD.   Awaiting V/Q scan and high resolution CT chest.  Will likely need treatment with selective pulmonary vasodilators.  __________  2D echo 04/2019: 1. The tricuspid regurgitant velocity is 4.03 m/s, and with an assumed  right atrial pressure of 15 mmHg, the estimated right ventricular systolic  pressure is severely elevated at 80.0 mmHg.  2. Severely elevated pulmonary artery systolic pressure.  3. Right ventricular volume and pressure overload.  4. Global right ventricle has mildly reduced systolic function.The right  ventricular size is severely enlarged. No increase in right ventricular  wall thickness.  5. Left ventricular ejection fraction, by visual estimation, is 65 to  70%. The left ventricle has normal function. There is no left ventricular  hypertrophy.  6. Left ventricular diastolic function could not be evaluated.  7. The left ventricle demonstrates regional wall motion abnormalities.  8. Left atrial size was normal.  9. Right atrial size was severely dilated.  10. The mitral valve is normal in structure. Trivial mitral valve  regurgitation.  11. The tricuspid valve is grossly normal.  12. The aortic valve is tricuspid. Aortic valve regurgitation is not  visualized. No evidence of aortic valve sclerosis or stenosis.  13. The pulmonic valve was grossly normal. Pulmonic valve regurgitation is  trivial.  14. The inferior vena cava is dilated in size with <50% respiratory  variability, suggesting right atrial pressure of 15 mmHg.  15. There is left bowing of the interatrial septum, suggestive of elevated  right atrial pressure.   EKG:  EKG is ordered today.  The EKG ordered today demonstrates ***  Recent Labs: 04/13/2019: Pro B Natriuretic peptide (BNP) 819.0 06/21/2019: Platelets 245 06/30/2019: Hemoglobin  12.2 07/07/2019: B Natriuretic Peptide 186.9; BUN 14; Creatinine, Ser 0.75; Potassium 4.7; Sodium 135  Recent Lipid Panel    Component Value Date/Time   CHOL 104 10/22/2016 1814   TRIG 47 10/22/2016 1814   HDL 47 10/22/2016 1814   CHOLHDL 2.2 10/22/2016 1814   VLDL 9 10/22/2016 1814   LDLCALC 48 10/22/2016 1814    PHYSICAL EXAM:    VS:  There were no vitals taken for this visit.  BMI: There is no height or weight on file to calculate BMI.  Physical Exam  Wt Readings from Last 3 Encounters:  07/07/19 197 lb (89.4 kg)  06/30/19 200 lb (90.7 kg)  06/21/19 222 lb 3.2 oz (100.8 kg)     ASSESSMENT & PLAN:   1. ***  Disposition: F/u with Dr. Fletcher Anon or an APP in ***.   Medication Adjustments/Labs and Tests Ordered: Current medicines are reviewed at length with the patient today.  Concerns regarding medicines are outlined above. Medication changes, Labs and Tests ordered today are summarized above and listed in the Patient Instructions accessible in Encounters.   Signed, Christell Faith, PA-C 07/07/2019 2:19 PM     [address], [place] [id number] [phone]

## 2019-07-08 ENCOUNTER — Ambulatory Visit (HOSPITAL_COMMUNITY)
Admission: RE | Admit: 2019-07-08 | Discharge: 2019-07-08 | Disposition: A | Discharge status: Home / Self Care | Payer: Medicare Other | Source: Ambulatory Visit | Attending: Cardiology | Admitting: Cardiology

## 2019-07-08 ENCOUNTER — Encounter (HOSPITAL_COMMUNITY)
Admission: RE | Admit: 2019-07-08 | Discharge: 2019-07-08 | Disposition: A | Discharge status: Home / Self Care | Payer: Medicare Other | Source: Ambulatory Visit | Attending: Cardiology | Admitting: Cardiology

## 2019-07-08 ENCOUNTER — Telehealth (HOSPITAL_COMMUNITY): Payer: Self-pay | Admitting: Vascular Surgery

## 2019-07-08 ENCOUNTER — Other Ambulatory Visit (HOSPITAL_COMMUNITY): Payer: Self-pay | Admitting: Cardiology

## 2019-07-08 ENCOUNTER — Inpatient Hospital Stay: Admission: RE | Admit: 2019-07-08 | Payer: Medicare Other | Source: Ambulatory Visit

## 2019-07-08 DIAGNOSIS — J849 Interstitial pulmonary disease, unspecified: Secondary | ICD-10-CM | POA: Diagnosis not present

## 2019-07-08 DIAGNOSIS — I272 Pulmonary hypertension, unspecified: Secondary | ICD-10-CM

## 2019-07-08 DIAGNOSIS — I110 Hypertensive heart disease with heart failure: Secondary | ICD-10-CM | POA: Diagnosis not present

## 2019-07-08 MED ORDER — TECHNETIUM TO 99M ALBUMIN AGGREGATED
1.5600 | Freq: Once | INTRAVENOUS | Status: AC | PRN
  Administered 2019-07-08: 12:00:00 1.56 via INTRAVENOUS

## 2019-07-08 NOTE — Telephone Encounter (Signed)
Called pt 3 times both home and cell # to inform pt that covid test day and time has been changed, no answer, no VM AND vm is full on home #

## 2019-07-09 NOTE — Progress Notes (Signed)
PCP: Albina Billet, MD Cardiology: Dr. Fletcher Anon HF Cardiology: Dr. Aundra Dubin  74 y.o. with history of chronic hypoxemic respiratory failure, OSA, and chronic diastolic CHF was referred by Dr. Fletcher Anon for evaluation of CHF and pulmonary hypertension.  Patient has been on home oxygen for 2-3 years per her report.  She had a DVT in 2018.  Anticoagulation was stopped due to refractory anemia and IVC filter was placed.  CT chest did not show a PE. She has OSA and uses CPAP.  Last echo in 1/21 showed normal LV EF with PASP 80 and severely dilated RV with mild RV dysfunction and D-shaped septum. She has been on Lasix for diuresis due to RV failure.    RHC/LHC was done in 3/21, showing mild CAD; mean RA 12, PA 68/25 mean 42, mean PCWP 9, CI 4.1, PVR 4.13 WU.   She returns for followup of pulmonary hypertension and RV failure.  Weight is down 25 lbs with diuresis.  She is taking Lasix 40 mg daily now.  She is feeling much better, short of breath after walking about 100 yards. No chest pain. No lightheadedness.  No orthopnea/PND.  She wears home oxygen at all times.   6 minute walk (3/21): 182 m  ECG (personally reviewed): NSR, inferior TWIs  Labs (12/20): BNP 819 Labs (2/21): K 4.4, creatinine 0.72 Labs (3/21): RF normal, ANA negative, anti-Jo-1 negative, anti-SCL-70 negative  PMH: 1. COVID-19 PNA 8/20.  2. Type 2 diabetes 3. DVT: right leg, 2018.  - Has IVC filter, not anticoagulated due to h/o anemia.  4. HTN 5. Hyperlipidemia.  6. OSA: Uses Bipap.  7. Chronic hypoxemic respiratory failure: 2-3 L home oxygen.  8. Hyponatremia on HCTZ 9. Chronic diastolic CHF:  - Lexiscan Cardiolite in 2/19 was low risk no ischemia.  - Echo (1/21): EF 65-70%, no LVH, trivial MR, PASP 80 mmHg, severe RV enlargement with mildly decreased RV systolic function, D-shaped interventricular septum, dilated IVC.  - CTA chest (8/18): No evidence for PE.  10. Pulmonary hypertension: RHC/LHC 3/21 with mild CAD; mean RA 12, PA  68/25 mean 42, mean PCWP 9, CI 4.1, PVR 4.13 WU.  - Serologic workup negative.   Social History   Socioeconomic History  . Marital status: Single    Spouse name: Not on file  . Number of children: Not on file  . Years of education: Not on file  . Highest education level: Not on file  Occupational History  . Occupation: CELL    Employer: RETIRED  Tobacco Use  . Smoking status: Former Smoker    Packs/day: 1.50    Years: 20.00    Pack years: 30.00    Quit date: 1999    Years since quitting: 22.2  . Smokeless tobacco: Never Used  Substance and Sexual Activity  . Alcohol use: No    Alcohol/week: 0.0 standard drinks  . Drug use: No  . Sexual activity: Not Currently  Other Topics Concern  . Not on file  Social History Narrative   Independent at baseline. Lives at home with her brother   Social Determinants of Health   Financial Resource Strain:   . Difficulty of Paying Living Expenses:   Food Insecurity:   . Worried About Charity fundraiser in the Last Year:   . Arboriculturist in the Last Year:   Transportation Needs:   . Film/video editor (Medical):   Marland Kitchen Lack of Transportation (Non-Medical):   Physical Activity:   . Days  of Exercise per Week:   . Minutes of Exercise per Session:   Stress:   . Feeling of Stress :   Social Connections:   . Frequency of Communication with Friends and Family:   . Frequency of Social Gatherings with Friends and Family:   . Attends Religious Services:   . Active Member of Clubs or Organizations:   . Attends Archivist Meetings:   Marland Kitchen Marital Status:   Intimate Partner Violence:   . Fear of Current or Ex-Partner:   . Emotionally Abused:   Marland Kitchen Physically Abused:   . Sexually Abused:    Family History  Problem Relation Age of Onset  . Prostate cancer Father   . Other Sister        mouth cancer  . Hypertension Mother    ROS: All systems reviewed and negative except as per HPI.   Current Outpatient Medications   Medication Sig Dispense Refill  . acetaminophen (TYLENOL) 325 MG tablet Take 2 tablets (650 mg total) by mouth every 6 (six) hours as needed for mild pain (or Fever >/= 101).    Marland Kitchen aspirin 81 MG chewable tablet Chew 1 tablet (81 mg total) by mouth daily. 30 tablet 0  . dapagliflozin propanediol (FARXIGA) 10 MG TABS tablet Take 10 mg by mouth daily.    . ferrous gluconate (FERGON) 325 MG tablet Take 325 mg by mouth in the morning and at bedtime.     . furosemide (LASIX) 40 MG tablet Take 40 mg by mouth daily.    Marland Kitchen HUMALOG KWIKPEN 100 UNIT/ML KiwkPen Inject 10 Units into the skin 2 (two) times daily.     . insulin glargine (LANTUS) 100 UNIT/ML injection Inject 32 Units into the skin daily.     Marland Kitchen latanoprost (XALATAN) 0.005 % ophthalmic solution Place 1 drop into both eyes at bedtime.     . lovastatin (MEVACOR) 20 MG tablet Take 20 mg by mouth every evening.     . metFORMIN (GLUCOPHAGE) 1000 MG tablet Take 1,000 mg by mouth daily.     . Multiple Vitamin (MULTIVITAMIN) tablet Take 1 tablet by mouth daily.    Marland Kitchen omeprazole (PRILOSEC) 40 MG capsule Take 40 mg by mouth daily.    . potassium chloride SA (KLOR-CON) 20 MEQ tablet Take 1 tablet (20 mEq total) by mouth daily. 90 tablet 3  . timolol (TIMOPTIC) 0.25 % ophthalmic solution Place 1 drop into both eyes 2 (two) times daily.     . macitentan (OPSUMIT) 10 MG tablet Take 1 tablet (10 mg total) by mouth daily. 30 tablet 11   No current facility-administered medications for this encounter.   BP 130/62   Pulse 80   Wt 89.4 kg (197 lb)   SpO2 98% Comment: 2L of oxygen  BMI 31.80 kg/m  General: NAD Neck: No JVD, no thyromegaly or thyroid nodule.  Lungs: Clear to auscultation bilaterally with normal respiratory effort. CV: Nondisplaced PMI.  Heart regular S1/S2, no S3/S4, no murmur.  No peripheral edema.  No carotid bruit.  Normal pedal pulses.  Abdomen: Soft, nontender, no hepatosplenomegaly, no distention.  Skin: Intact without lesions or  rashes.  Neurologic: Alert and oriented x 3.  Psych: Normal affect. Extremities: No clubbing or cyanosis.  HEENT: Normal.   Assessment/Plan: 1. RV failure: Echo in 1/21 showed significant RV dysfunction with severe pulmonary hypertension and D-shaped interventricular septum.  Based on RHC, it looks like RV failure is due to pulmonary arterial hypertension rather than LV failure (normal  PCWP).  She has lost > 20 lbs with diuresis and looks near-euvolemic on exam.  - Continue Lasix 40 mg daily.  BMET/BNP today.  2. Pulmonary HTN: Echo in 1/21 with RV failure and estimate PA systolic pressure 80 mmHg.  RHC in 3/21 showed pulmonary arterial hypertension.  Serologic workup was negative.  Suspicious for primary pulmonary hypertension. Baseline 6 minute walk done today.  - I will arrange for V/Q scan to look for chronic PE => to be done today.  - I will arrange for high resolution chest CT to look for ILD given chronic hypoxemic respiratory failure => to be done today.  - I will arrange for PFTs.  - Continue to use Bipap at night.  - If V/Q scan and high resolution CT unremarkable, will start Opsumit 10 mg daily with addition of tadalafil soon after.  3. H/o DVT: 2018.  She has IVC filter and is not anticoagulated due to h/o anemia.  - As above, I will get V/Q scan to look for evidence of chronic PE.  4. Type 2 diabetes: She is now on Farxiga.  5. Chronic hypoxemic respiratory failure: May be due to pulmonary hypertension.  No known lung parenchymal disease.  - Workup as above with PFTs and high resolution CT chest as well as V/Q scan.   Followup in 6 wks.   Loralie Champagne 07/09/2019

## 2019-07-11 ENCOUNTER — Ambulatory Visit: Payer: Medicare Other | Admitting: Physician Assistant

## 2019-07-11 ENCOUNTER — Other Ambulatory Visit
Admission: RE | Admit: 2019-07-11 | Discharge: 2019-07-11 | Disposition: A | Discharge status: Home / Self Care | Payer: Medicare Other | Source: Ambulatory Visit | Attending: Physician Assistant | Admitting: Physician Assistant

## 2019-07-11 DIAGNOSIS — Z01812 Encounter for preprocedural laboratory examination: Secondary | ICD-10-CM | POA: Insufficient documentation

## 2019-07-11 DIAGNOSIS — Z20822 Contact with and (suspected) exposure to covid-19: Secondary | ICD-10-CM | POA: Diagnosis not present

## 2019-07-11 LAB — SARS CORONAVIRUS 2 (TAT 6-24 HRS): SARS Coronavirus 2: NEGATIVE

## 2019-07-13 ENCOUNTER — Other Ambulatory Visit: Payer: Self-pay

## 2019-07-13 ENCOUNTER — Ambulatory Visit (HOSPITAL_COMMUNITY)
Admission: RE | Admit: 2019-07-13 | Discharge: 2019-07-13 | Disposition: A | Discharge status: Home / Self Care | Payer: Medicare Other | Source: Ambulatory Visit | Attending: Cardiology | Admitting: Cardiology

## 2019-07-13 DIAGNOSIS — I5032 Chronic diastolic (congestive) heart failure: Secondary | ICD-10-CM | POA: Diagnosis not present

## 2019-07-13 LAB — PULMONARY FUNCTION TEST
DL/VA % pred: 49 %
DL/VA: 2.02 ml/min/mmHg/L
DLCO unc % pred: 32 %
DLCO unc: 6.67 ml/min/mmHg
FEF 25-75 Post: 0.66 L/sec
FEF 25-75 Pre: 0.45 L/sec
FEF2575-%Change-Post: 44 %
FEF2575-%Pred-Post: 38 %
FEF2575-%Pred-Pre: 26 %
FEV1-%Change-Post: 12 %
FEV1-%Pred-Post: 66 %
FEV1-%Pred-Pre: 59 %
FEV1-Post: 1.28 L
FEV1-Pre: 1.14 L
FEV1FVC-%Change-Post: 1 %
FEV1FVC-%Pred-Pre: 72 %
FEV6-%Change-Post: 11 %
FEV6-%Pred-Post: 89 %
FEV6-%Pred-Pre: 80 %
FEV6-Post: 2.12 L
FEV6-Pre: 1.91 L
FEV6FVC-%Change-Post: 0 %
FEV6FVC-%Pred-Post: 97 %
FEV6FVC-%Pred-Pre: 97 %
FVC-%Change-Post: 11 %
FVC-%Pred-Post: 91 %
FVC-%Pred-Pre: 82 %
FVC-Post: 2.26 L
FVC-Pre: 2.04 L
Post FEV1/FVC ratio: 56 %
Post FEV6/FVC ratio: 94 %
Pre FEV1/FVC ratio: 56 %
Pre FEV6/FVC Ratio: 94 %
RV % pred: 127 %
RV: 3.02 L
TLC % pred: 93 %
TLC: 5.01 L

## 2019-07-13 MED ORDER — ALBUTEROL SULFATE (2.5 MG/3ML) 0.083% IN NEBU
2.5000 mg | INHALATION_SOLUTION | Freq: Once | RESPIRATORY_TRACT | Status: AC
  Administered 2019-07-13: 2.5 mg via RESPIRATORY_TRACT

## 2019-07-14 NOTE — Telephone Encounter (Signed)
Received message that patient's enrollment with [facility] has been completed so she is now able to start on Opsumit. Referral was placed to [facility] and patient will receive Opsumit through [place].   Audry Riles, PharmD, BCPS, BCCP, CPP Heart Failure Clinic Pharmacist [phone]

## 2019-07-15 ENCOUNTER — Other Ambulatory Visit (HOSPITAL_COMMUNITY): Payer: Self-pay | Admitting: *Deleted

## 2019-07-15 MED ORDER — MACITENTAN 10 MG PO TABS: 10.0000 mg | ORAL_TABLET | Freq: Every day | ORAL | 11 refills | Status: DC

## 2019-08-12 ENCOUNTER — Encounter: Payer: Self-pay | Admitting: Internal Medicine

## 2019-08-15 ENCOUNTER — Inpatient Hospital Stay: Payer: Medicare Other

## 2019-08-15 ENCOUNTER — Encounter: Payer: Self-pay | Admitting: Internal Medicine

## 2019-08-15 ENCOUNTER — Inpatient Hospital Stay: Payer: Medicare Other | Attending: Internal Medicine | Admitting: Internal Medicine

## 2019-08-15 ENCOUNTER — Encounter (INDEPENDENT_AMBULATORY_CARE_PROVIDER_SITE_OTHER): Payer: Self-pay

## 2019-08-15 ENCOUNTER — Other Ambulatory Visit: Payer: Self-pay

## 2019-08-15 DIAGNOSIS — Z86718 Personal history of other venous thrombosis and embolism: Secondary | ICD-10-CM | POA: Insufficient documentation

## 2019-08-15 DIAGNOSIS — E119 Type 2 diabetes mellitus without complications: Secondary | ICD-10-CM | POA: Diagnosis not present

## 2019-08-15 DIAGNOSIS — J449 Chronic obstructive pulmonary disease, unspecified: Secondary | ICD-10-CM | POA: Diagnosis not present

## 2019-08-15 DIAGNOSIS — Z8042 Family history of malignant neoplasm of prostate: Secondary | ICD-10-CM | POA: Insufficient documentation

## 2019-08-15 DIAGNOSIS — K5900 Constipation, unspecified: Secondary | ICD-10-CM | POA: Insufficient documentation

## 2019-08-15 DIAGNOSIS — I10 Essential (primary) hypertension: Secondary | ICD-10-CM | POA: Diagnosis not present

## 2019-08-15 DIAGNOSIS — Z79899 Other long term (current) drug therapy: Secondary | ICD-10-CM | POA: Insufficient documentation

## 2019-08-15 DIAGNOSIS — E669 Obesity, unspecified: Secondary | ICD-10-CM | POA: Insufficient documentation

## 2019-08-15 DIAGNOSIS — Z87891 Personal history of nicotine dependence: Secondary | ICD-10-CM | POA: Insufficient documentation

## 2019-08-15 DIAGNOSIS — E611 Iron deficiency: Secondary | ICD-10-CM

## 2019-08-15 DIAGNOSIS — Z800 Family history of malignant neoplasm of digestive organs: Secondary | ICD-10-CM | POA: Insufficient documentation

## 2019-08-15 DIAGNOSIS — D509 Iron deficiency anemia, unspecified: Secondary | ICD-10-CM | POA: Diagnosis present

## 2019-08-15 DIAGNOSIS — I509 Heart failure, unspecified: Secondary | ICD-10-CM | POA: Diagnosis not present

## 2019-08-15 DIAGNOSIS — Z794 Long term (current) use of insulin: Secondary | ICD-10-CM | POA: Insufficient documentation

## 2019-08-15 DIAGNOSIS — E785 Hyperlipidemia, unspecified: Secondary | ICD-10-CM | POA: Diagnosis not present

## 2019-08-15 DIAGNOSIS — Z7982 Long term (current) use of aspirin: Secondary | ICD-10-CM | POA: Insufficient documentation

## 2019-08-15 LAB — COMPREHENSIVE METABOLIC PANEL
ALT: 13 U/L (ref 0–44)
AST: 17 U/L (ref 15–41)
Albumin: 3.7 g/dL (ref 3.5–5.0)
Alkaline Phosphatase: 51 U/L (ref 38–126)
Anion gap: 8 (ref 5–15)
BUN: 15 mg/dL (ref 8–23)
CO2: 26 mmol/L (ref 22–32)
Calcium: 8.6 mg/dL — ABNORMAL LOW (ref 8.9–10.3)
Chloride: 102 mmol/L (ref 98–111)
Creatinine, Ser: 0.95 mg/dL (ref 0.44–1.00)
GFR calc Af Amer: >60 mL/min (ref >60)
GFR calc non Af Amer: 59 mL/min — ABNORMAL LOW (ref >60)
Glucose, Bld: 202 mg/dL — ABNORMAL HIGH (ref 70–99)
Potassium: 4.3 mmol/L (ref 3.5–5.1)
Sodium: 136 mmol/L (ref 135–145)
Total Bilirubin: 0.5 mg/dL (ref 0.3–1.2)
Total Protein: 6.8 g/dL (ref 6.5–8.1)

## 2019-08-15 LAB — LACTATE DEHYDROGENASE: LDH: 127 U/L (ref 98–192)

## 2019-08-15 LAB — CBC WITH DIFFERENTIAL/PLATELET
Abs Immature Granulocytes: 0.06 10*3/uL (ref 0.00–0.07)
Basophils Absolute: 0 10*3/uL (ref 0.0–0.1)
Basophils Relative: 1 %
Eosinophils Absolute: 0 10*3/uL (ref 0.0–0.5)
Eosinophils Relative: 0 %
HCT: 26.6 % — ABNORMAL LOW (ref 36.0–46.0)
Hemoglobin: 7.2 g/dL — ABNORMAL LOW (ref 12.0–15.0)
Immature Granulocytes: 1 %
Lymphocytes Relative: 8 %
Lymphs Abs: 0.7 10*3/uL (ref 0.7–4.0)
MCH: 24.9 pg — ABNORMAL LOW (ref 26.0–34.0)
MCHC: 27.1 g/dL — ABNORMAL LOW (ref 30.0–36.0)
MCV: 92 fL (ref 80.0–100.0)
Monocytes Absolute: 0.5 10*3/uL (ref 0.1–1.0)
Monocytes Relative: 7 %
Neutro Abs: 6.9 10*3/uL (ref 1.7–7.7)
Neutrophils Relative %: 83 %
Platelets: 299 10*3/uL (ref 150–400)
RBC: 2.89 MIL/uL — ABNORMAL LOW (ref 3.87–5.11)
RDW: 15.9 % — ABNORMAL HIGH (ref 11.5–15.5)
WBC: 8.1 10*3/uL (ref 4.0–10.5)
nRBC: 0.4 % — ABNORMAL HIGH (ref 0.0–0.2)

## 2019-08-15 LAB — FERRITIN: Ferritin: 10 ng/mL — ABNORMAL LOW (ref 11–307)

## 2019-08-15 LAB — TECHNOLOGIST SMEAR REVIEW: Plt Morphology: ADEQUATE

## 2019-08-15 LAB — IRON AND TIBC
Iron: 11 ug/dL — ABNORMAL LOW (ref 28–170)
Saturation Ratios: 3 % — ABNORMAL LOW (ref 10.4–31.8)
TIBC: 448 ug/dL (ref 250–450)
UIBC: 437 ug/dL

## 2019-08-15 LAB — RETICULOCYTES
Immature Retic Fract: 42.4 % — ABNORMAL HIGH (ref 2.3–15.9)
RBC.: 2.87 MIL/uL — ABNORMAL LOW (ref 3.87–5.11)
Retic Count, Absolute: 223.3 10*3/uL — ABNORMAL HIGH (ref 19.0–186.0)
Retic Ct Pct: 7.8 % — ABNORMAL HIGH (ref 0.4–3.1)
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
## 2019-08-15 NOTE — Assessment & Plan Note (Addendum)
#  Iron deficiency anemia- [PCP]-hemoglobin 8.8 iron saturation 5%; no ferritin.  Suggestive of iron deficiency unclear etiology.  Poor tolerance to p.o. iron.  Recommend IV iron infusion.   Discussed the potential acute infusion reactions with IV iron; which are quite rare.  Patient understands the risk; will proceed with infusions.   #Etiology of iron deficiency is unclear; s/p previous work-up-including EGD colonoscopy.  Await work-up from today; recommend evaluation with GI.  #CHF/COPD home O2-borderline respiratory status-stable.  Thank you Dr.Tate for allowing me to participate in the care of your pleasant patient. Please do not hesitate to contact me with questions or concerns in the interim.   # DISPOSITION: # labs today [ordered] # Venofer weekly x 4; start next week.  # follow up in 6 weeks- MD; labs- cbc; Possible venofer-Dr.B

## 2019-08-16 LAB — MULTIPLE MYELOMA PANEL, SERUM
Albumin SerPl Elph-Mcnc: 3.4 g/dL (ref 2.9–4.4)
Albumin/Glob SerPl: 1.1 (ref 0.7–1.7)
Alpha 1: 0.3 g/dL (ref 0.0–0.4)
Alpha2 Glob SerPl Elph-Mcnc: 0.7 g/dL (ref 0.4–1.0)
B-Globulin SerPl Elph-Mcnc: 1.1 g/dL (ref 0.7–1.3)
Gamma Glob SerPl Elph-Mcnc: 1.1 g/dL (ref 0.4–1.8)
Globulin, Total: 3.2 g/dL (ref 2.2–3.9)
IgA: 124 mg/dL (ref 64–422)
IgG (Immunoglobin G), Serum: 1050 mg/dL (ref 586–1602)
IgM (Immunoglobulin M), Srm: 64 mg/dL (ref 26–217)
Total Protein ELP: 6.6 g/dL (ref 6.0–8.5)

## 2019-08-16 LAB — KAPPA/LAMBDA LIGHT CHAINS
Kappa free light chain: 30.2 mg/L — ABNORMAL HIGH (ref 3.3–19.4)
Kappa, lambda light chain ratio: 1.42 (ref 0.26–1.65)
Lambda free light chains: 21.2 mg/L (ref 5.7–26.3)

## 2019-08-18 ENCOUNTER — Telehealth: Payer: Self-pay | Admitting: Internal Medicine

## 2019-08-18 NOTE — Telephone Encounter (Signed)
I tried to reach patient- re: anemia/hb 7.4. wanted to discuss re: need PRBC transfusion. Pt awaiting venofer next week.  Heather- please reach out to patient; and I will be willing to talk to pt re: above.  GB

## 2019-08-19 ENCOUNTER — Telehealth: Payer: Self-pay | Admitting: Internal Medicine

## 2019-08-19 ENCOUNTER — Other Ambulatory Visit: Payer: Self-pay | Admitting: *Deleted

## 2019-08-19 DIAGNOSIS — E611 Iron deficiency: Secondary | ICD-10-CM

## 2019-08-19 NOTE — Telephone Encounter (Signed)
Spoke to patient regarding her hemoglobin 7.4.  Recommend PRBC transfusion as patient is symptomatic/especially the context of her COPD CHF home O2.  Discussed the pros and cons of transfusion.  Patient interested.  We will continue Venofer as planned/PRBC transfusion 1 unit.  Discussed with heather.

## 2019-08-22 ENCOUNTER — Other Ambulatory Visit: Payer: Self-pay

## 2019-08-22 ENCOUNTER — Inpatient Hospital Stay: Payer: Medicare Other

## 2019-08-22 ENCOUNTER — Other Ambulatory Visit: Payer: Self-pay | Admitting: Internal Medicine

## 2019-08-22 ENCOUNTER — Other Ambulatory Visit: Payer: Self-pay | Admitting: *Deleted

## 2019-08-22 DIAGNOSIS — E611 Iron deficiency: Secondary | ICD-10-CM

## 2019-08-22 DIAGNOSIS — D509 Iron deficiency anemia, unspecified: Secondary | ICD-10-CM | POA: Diagnosis not present

## 2019-08-22 LAB — CBC WITH DIFFERENTIAL/PLATELET
Abs Immature Granulocytes: 0.04 10*3/uL (ref 0.00–0.07)
Basophils Absolute: 0 10*3/uL (ref 0.0–0.1)
Basophils Relative: 1 %
Eosinophils Absolute: 0 10*3/uL (ref 0.0–0.5)
Eosinophils Relative: 0 %
HCT: 27.6 % — ABNORMAL LOW (ref 36.0–46.0)
Hemoglobin: 7.5 g/dL — ABNORMAL LOW (ref 12.0–15.0)
Immature Granulocytes: 1 %
Lymphocytes Relative: 7 %
Lymphs Abs: 0.6 10*3/uL — ABNORMAL LOW (ref 0.7–4.0)
MCH: 24.6 pg — ABNORMAL LOW (ref 26.0–34.0)
MCHC: 27.2 g/dL — ABNORMAL LOW (ref 30.0–36.0)
MCV: 90.5 fL (ref 80.0–100.0)
Monocytes Absolute: 0.5 10*3/uL (ref 0.1–1.0)
Monocytes Relative: 6 %
Neutro Abs: 7.5 10*3/uL (ref 1.7–7.7)
Neutrophils Relative %: 85 %
Platelets: 243 10*3/uL (ref 150–400)
RBC: 3.05 MIL/uL — ABNORMAL LOW (ref 3.87–5.11)
RDW: 16.2 % — ABNORMAL HIGH (ref 11.5–15.5)
WBC: 8.7 10*3/uL (ref 4.0–10.5)
nRBC: 0.3 % — ABNORMAL HIGH (ref 0.0–0.2)

## 2019-08-22 LAB — SAMPLE TO BLOOD BANK

## 2019-08-22 LAB — PREPARE RBC (CROSSMATCH)

## 2019-08-22 MED ORDER — DIPHENHYDRAMINE HCL 25 MG PO CAPS
25.0000 mg | ORAL_CAPSULE | Freq: Once | ORAL | Status: AC
  Administered 2019-08-22: 25 mg via ORAL
  Filled 2019-08-22: qty 1

## 2019-08-22 MED ORDER — SODIUM CHLORIDE 0.9% IV SOLUTION
250.0000 mL | Freq: Once | INTRAVENOUS | Status: AC
  Administered 2019-08-22: 250 mL via INTRAVENOUS
  Filled 2019-08-22: qty 250

## 2019-08-22 MED ORDER — ACETAMINOPHEN 325 MG PO TABS
650.0000 mg | ORAL_TABLET | Freq: Once | ORAL | Status: AC
  Administered 2019-08-22: 650 mg via ORAL
  Filled 2019-08-22: qty 2

## 2019-08-23 ENCOUNTER — Inpatient Hospital Stay: Payer: Medicare Other

## 2019-08-23 ENCOUNTER — Ambulatory Visit (HOSPITAL_COMMUNITY)
Admission: RE | Admit: 2019-08-23 | Discharge: 2019-08-23 | Disposition: A | Discharge status: Home / Self Care | Payer: Medicare Other | Source: Ambulatory Visit | Attending: Cardiology | Admitting: Cardiology

## 2019-08-23 VITALS — BP 130/56 | HR 88 | Wt 194.0 lb

## 2019-08-23 VITALS — BP 112/51 | HR 68 | Temp 97.6°F | Resp 20

## 2019-08-23 DIAGNOSIS — I272 Pulmonary hypertension, unspecified: Secondary | ICD-10-CM | POA: Diagnosis not present

## 2019-08-23 DIAGNOSIS — Z7982 Long term (current) use of aspirin: Secondary | ICD-10-CM | POA: Insufficient documentation

## 2019-08-23 DIAGNOSIS — Z87891 Personal history of nicotine dependence: Secondary | ICD-10-CM | POA: Insufficient documentation

## 2019-08-23 DIAGNOSIS — Z8701 Personal history of pneumonia (recurrent): Secondary | ICD-10-CM | POA: Diagnosis not present

## 2019-08-23 DIAGNOSIS — E785 Hyperlipidemia, unspecified: Secondary | ICD-10-CM | POA: Diagnosis not present

## 2019-08-23 DIAGNOSIS — I5032 Chronic diastolic (congestive) heart failure: Secondary | ICD-10-CM | POA: Insufficient documentation

## 2019-08-23 DIAGNOSIS — Z95828 Presence of other vascular implants and grafts: Secondary | ICD-10-CM | POA: Insufficient documentation

## 2019-08-23 DIAGNOSIS — E611 Iron deficiency: Secondary | ICD-10-CM | POA: Diagnosis not present

## 2019-08-23 DIAGNOSIS — Z79899 Other long term (current) drug therapy: Secondary | ICD-10-CM | POA: Insufficient documentation

## 2019-08-23 DIAGNOSIS — Z86718 Personal history of other venous thrombosis and embolism: Secondary | ICD-10-CM | POA: Insufficient documentation

## 2019-08-23 DIAGNOSIS — Z8616 Personal history of COVID-19: Secondary | ICD-10-CM | POA: Diagnosis not present

## 2019-08-23 DIAGNOSIS — E871 Hypo-osmolality and hyponatremia: Secondary | ICD-10-CM | POA: Diagnosis not present

## 2019-08-23 DIAGNOSIS — Z9889 Other specified postprocedural states: Secondary | ICD-10-CM | POA: Diagnosis not present

## 2019-08-23 DIAGNOSIS — I2721 Secondary pulmonary arterial hypertension: Secondary | ICD-10-CM | POA: Insufficient documentation

## 2019-08-23 DIAGNOSIS — J449 Chronic obstructive pulmonary disease, unspecified: Secondary | ICD-10-CM | POA: Diagnosis not present

## 2019-08-23 DIAGNOSIS — J9611 Chronic respiratory failure with hypoxia: Secondary | ICD-10-CM | POA: Diagnosis not present

## 2019-08-23 DIAGNOSIS — I50810 Right heart failure, unspecified: Secondary | ICD-10-CM | POA: Diagnosis not present

## 2019-08-23 DIAGNOSIS — Z9981 Dependence on supplemental oxygen: Secondary | ICD-10-CM | POA: Diagnosis not present

## 2019-08-23 DIAGNOSIS — Z794 Long term (current) use of insulin: Secondary | ICD-10-CM | POA: Diagnosis not present

## 2019-08-23 DIAGNOSIS — D509 Iron deficiency anemia, unspecified: Secondary | ICD-10-CM | POA: Diagnosis not present

## 2019-08-23 DIAGNOSIS — I110 Hypertensive heart disease with heart failure: Secondary | ICD-10-CM | POA: Insufficient documentation

## 2019-08-23 DIAGNOSIS — E119 Type 2 diabetes mellitus without complications: Secondary | ICD-10-CM | POA: Diagnosis not present

## 2019-08-23 DIAGNOSIS — G4733 Obstructive sleep apnea (adult) (pediatric): Secondary | ICD-10-CM | POA: Diagnosis not present

## 2019-08-23 LAB — BASIC METABOLIC PANEL
Anion gap: 11 (ref 5–15)
BUN: 13 mg/dL (ref 8–23)
CO2: 25 mmol/L (ref 22–32)
Calcium: 9.2 mg/dL (ref 8.9–10.3)
Chloride: 103 mmol/L (ref 98–111)
Creatinine, Ser: 0.88 mg/dL (ref 0.44–1.00)
GFR calc Af Amer: >60 mL/min (ref >60)
GFR calc non Af Amer: >60 mL/min (ref >60)
Glucose, Bld: 140 mg/dL — ABNORMAL HIGH (ref 70–99)
Potassium: 4.3 mmol/L (ref 3.5–5.1)
Sodium: 139 mmol/L (ref 135–145)

## 2019-08-23 LAB — TYPE AND SCREEN
ABO/RH(D): B POS
Antibody Screen: NEGATIVE
Unit division: 0

## 2019-08-23 LAB — BPAM RBC
Blood Product Expiration Date: 202105212359
ISSUE DATE / TIME: 202105101113
Unit Type and Rh: 1700

## 2019-08-23 MED ORDER — IRON SUCROSE 20 MG/ML IV SOLN
200.0000 mg | Freq: Once | INTRAVENOUS | Status: AC
  Administered 2019-08-23: 200 mg via INTRAVENOUS
  Filled 2019-08-23: qty 10

## 2019-08-23 MED ORDER — TADALAFIL 20 MG PO TABS
20.0000 mg | ORAL_TABLET | Freq: Every day | ORAL | 3 refills | Status: DC
Start: 2019-08-23 — End: 2019-08-24

## 2019-08-23 MED ORDER — SODIUM CHLORIDE 0.9 % IV SOLN
Freq: Once | INTRAVENOUS | Status: AC
  Filled 2019-08-23: qty 250

## 2019-08-23 NOTE — Patient Instructions (Signed)
Labs done today. We will contact you only if your labs are abnormal.  START Tadalafil 43m daily(Our Clinic Pharmacist, LAudry Rileswill contact you about starting this medication)  No other medication changes were made. Please continue all other medication as prescribed.  [facility] been [facility] to [facility] They will contact you to schedule an appointment.  Your physician recommends that you schedule a follow-up appointment in: 3 weeks for an appointment with our Clinic Pharmacist and in 6 weeks for an appointment with Dr. MAundra Dubin  At the [facility] you and your health needs are our priority. As part of our continuing mission to provide you with exceptional heart care, we have created designated Provider Care Teams. These Care Teams include your primary Cardiologist (physician) and Advanced Practice Providers (APPs- Physician Assistants and Nurse Practitioners) who all work together to provide you with the care you need, when you need it.   You may see any of the following providers on your designated Care Team at your next follow up: .Marland KitchenDr DGlori Bickers. Dr DLoralie Champagne. ADarrick Grinder NP . BLyda Jester PA . LAudry Riles PharmD   Please be sure to bring in all your medications bottles to every appointment.

## 2019-08-23 NOTE — Progress Notes (Signed)
6 minute walk. Pt walked 28mters. Pt O2 dopped as low as 76% on 4L, HR as high as 130. Pt tolerated well. Some sob at completion. Recovered to 98% and HR 90. Advised per MD to increase O2 supply to 4L when walking.

## 2019-08-23 NOTE — Progress Notes (Signed)
Pt tolerated venofer infusion well with no signs of complications or reaction. VSS. RN educated pt on the importance of notifying the clinic if any complications occur at home and if it is an emergency to call 911. Pt verbalized understanding. Pt stable for discharge.   Keyshaun Exley [employer]

## 2019-08-24 ENCOUNTER — Telehealth (HOSPITAL_COMMUNITY): Payer: Self-pay | Admitting: Pharmacist

## 2019-08-24 MED ORDER — TADALAFIL 20 MG PO TABS: 20.0000 mg | ORAL_TABLET | Freq: Every day | ORAL | 3 refills | Status: DC

## 2019-08-24 NOTE — Telephone Encounter (Signed)
Advanced Heart Failure Patient Advocate Encounter  Prior Authorization for Tadalafil has been approved.    PA#  [id number] Effective dates: 08/24/19 through 04/13/20  Audry Riles, PharmD, BCPS, BCCP, CPP Heart Failure Clinic Pharmacist [phone]

## 2019-08-24 NOTE — Telephone Encounter (Signed)
Patient Advocate Encounter   Received notification from [employer] that prior authorization for Tadalafil is required.   PA submitted on CoverMyMeds Key [id number] Status is pending   Will continue to follow.  Audry Riles, PharmD, BCPS, BCCP, CPP Heart Failure Clinic Pharmacist [phone]

## 2019-08-24 NOTE — Progress Notes (Signed)
PCP: Albina Billet, MD Cardiology: Dr. Fletcher Anon HF Cardiology: Dr. Aundra Dubin  74 y.o. with history of chronic hypoxemic respiratory failure, OSA, and chronic diastolic CHF was referred by Dr. Fletcher Anon for evaluation of CHF and pulmonary hypertension.  Patient has been on home oxygen for 2-3 years per her report.  She was a smoker remotely. She had a DVT in 2018.  Anticoagulation was stopped due to refractory anemia and IVC filter was placed.  CT chest did not show a PE. She has OSA and uses CPAP.  Last echo in 1/21 showed normal LV EF with PASP 80 and severely dilated RV with mild RV dysfunction and D-shaped septum. She has been on Lasix for diuresis due to RV failure.    RHC/LHC was done in 3/21, showing mild CAD; mean RA 12, PA 68/25 mean 42, mean PCWP 9, CI 4.1, PVR 4.13 WU.  V/Q scan in 3/21 did not show evidence for chronic PE.  High resolution chest CT in 3/21 did not so evidence for ILD, it did show mild-moderate emphysema. PFTs (3/21) showed moderate-severe COPD.   She returns for followup of pulmonary hypertension and RV failure.  Weight is down another 3 lbs.  She has had a long history of Fe deficiency anemia and is seeing hematology.  Most recently, hgb was 7.5.  After this reading, she got both IV Fe and PRBCs.  No BRBPR/melena.  She is short of breath only after walking a long distance.  No orthopnea/PND.  No lightheadedness/syncope.  No chest pain.    6 minute walk (3/21): 182 m 6 minute walk (5/21): 243 m, oxygen saturation dropped to as low as 70s  ECG (personally reviewed): NSR, inferior TWIs  Labs (12/20): BNP 819 Labs (2/21): K 4.4, creatinine 0.72 Labs (3/21): RF normal, ANA negative, anti-Jo-1 negative, anti-SCL-70 negative  PMH: 1. COVID-19 PNA 8/20.  2. Type 2 diabetes 3. DVT: right leg, 2018.  - Has IVC filter, not anticoagulated due to h/o anemia.  4. HTN 5. Hyperlipidemia.  6. OSA: Uses Bipap.  7. Chronic hypoxemic respiratory failure: 2-3 L home oxygen.  8. Hyponatremia  on HCTZ 9. Chronic diastolic CHF:  - Lexiscan Cardiolite in 2/19 was low risk no ischemia.  - Echo (1/21): EF 65-70%, no LVH, trivial MR, PASP 80 mmHg, severe RV enlargement with mildly decreased RV systolic function, D-shaped interventricular septum, dilated IVC.  - CTA chest (8/18): No evidence for PE.  10. Pulmonary hypertension: RHC/LHC 3/21 with mild CAD; mean RA 12, PA 68/25 mean 42, mean PCWP 9, CI 4.1, PVR 4.13 WU.  - Serologic workup negative.  - V/Q scan (3/21): No chronic PE.  - HRCT (3/21): No ILD, mild-moderate COPD. - PFTs (3/21): moderate-severe COPD  Social History   Socioeconomic History  . Marital status: Single    Spouse name: Not on file  . Number of children: Not on file  . Years of education: Not on file  . Highest education level: Not on file  Occupational History  . Occupation: CELL    Employer: RETIRED  Tobacco Use  . Smoking status: Former Smoker    Packs/day: 1.50    Years: 20.00    Pack years: 30.00    Quit date: 1999    Years since quitting: 22.3  . Smokeless tobacco: Never Used  Substance and Sexual Activity  . Alcohol use: No    Alcohol/week: 0.0 standard drinks  . Drug use: No  . Sexual activity: Not Currently  Other Topics Concern  .  Not on file  Social History Narrative   Independent at baseline. Lives at home with her brother; in [place]. Quit smoking 20 years; no alcohol.    Social Determinants of Health   Financial Resource Strain:   . Difficulty of Paying Living Expenses:   Food Insecurity:   . Worried About Charity fundraiser in the Last Year:   . Arboriculturist in the Last Year:   Transportation Needs:   . Film/video editor (Medical):   Marland Kitchen Lack of Transportation (Non-Medical):   Physical Activity:   . Days of Exercise per Week:   . Minutes of Exercise per Session:   Stress:   . Feeling of Stress :   Social Connections:   . Frequency of Communication with Friends and Family:   . Frequency of Social Gatherings  with Friends and Family:   . Attends Religious Services:   . Active Member of Clubs or Organizations:   . Attends Archivist Meetings:   Marland Kitchen Marital Status:   Intimate Partner Violence:   . Fear of Current or Ex-Partner:   . Emotionally Abused:   Marland Kitchen Physically Abused:   . Sexually Abused:    Family History  Problem Relation Age of Onset  . Prostate cancer Father   . Other Sister        mouth cancer  . Hypertension Mother    ROS: All systems reviewed and negative except as per HPI.   Current Outpatient Medications  Medication Sig Dispense Refill  . acetaminophen (TYLENOL) 325 MG tablet Take 2 tablets (650 mg total) by mouth every 6 (six) hours as needed for mild pain (or Fever >/= 101).    Marland Kitchen aspirin 81 MG chewable tablet Chew 1 tablet (81 mg total) by mouth daily. 30 tablet 0  . dapagliflozin propanediol (FARXIGA) 10 MG TABS tablet Take 10 mg by mouth daily.    . ferrous gluconate (FERGON) 325 MG tablet Take 325 mg by mouth in the morning and at bedtime.     . furosemide (LASIX) 40 MG tablet Take 40 mg by mouth daily.    Marland Kitchen HUMALOG KWIKPEN 100 UNIT/ML KiwkPen Inject 10 Units into the skin 2 (two) times daily.     . insulin glargine (LANTUS) 100 UNIT/ML injection Inject 32 Units into the skin daily.     Marland Kitchen latanoprost (XALATAN) 0.005 % ophthalmic solution Place 1 drop into both eyes at bedtime.     . lovastatin (MEVACOR) 20 MG tablet Take 20 mg by mouth every evening.     . macitentan (OPSUMIT) 10 MG tablet Take 1 tablet (10 mg total) by mouth daily. 30 tablet 11  . metFORMIN (GLUCOPHAGE) 1000 MG tablet Take 1,000 mg by mouth daily.     . Multiple Vitamin (MULTIVITAMIN) tablet Take 1 tablet by mouth daily.    Marland Kitchen omeprazole (PRILOSEC) 40 MG capsule Take 40 mg by mouth daily.    . potassium chloride SA (KLOR-CON) 20 MEQ tablet Take 1 tablet (20 mEq total) by mouth daily. 90 tablet 3  . timolol (TIMOPTIC) 0.25 % ophthalmic solution Place 1 drop into both eyes 2 (two) times daily.      . tadalafil (CIALIS) 20 MG tablet Take 1 tablet (20 mg total) by mouth daily. 90 tablet 3   No current facility-administered medications for this encounter.   BP (!) 130/56   Pulse 88   Wt 88 kg (194 lb)   SpO2 95%   BMI 31.31 kg/m  General:  NAD Neck: No JVD, no thyromegaly or thyroid nodule.  Lungs: Clear to auscultation bilaterally with normal respiratory effort. CV: Nondisplaced PMI.  Heart regular S1/S2, no S3/S4, no murmur.  Trace ankle edema.  No carotid bruit.  Normal pedal pulses.  Abdomen: Soft, nontender, no hepatosplenomegaly, no distention.  Skin: Intact without lesions or rashes.  Neurologic: Alert and oriented x 3.  Psych: Normal affect. Extremities: No clubbing or cyanosis.  HEENT: Normal.   Assessment/Plan: 1. RV failure: Echo in 1/21 showed significant RV dysfunction with severe pulmonary hypertension and D-shaped interventricular septum.  Based on RHC, it looks like RV failure is due to pulmonary arterial hypertension rather than LV failure (normal PCWP).  Weight is down with diuresis and she looks near-euvolemic on exam.  - Continue Lasix 40 mg daily.  BMET/BNP today.  2. Pulmonary HTN: Echo in 1/21 with RV failure and estimate PA systolic pressure 80 mmHg.  RHC in 3/21 showed pulmonary arterial hypertension.  Serologic workup was negative.  V/Q scan showed no chronic PE.  HRCT showed no ILD, mild-moderate emphysema.  PFTs suggestive of moderate-severe COPD. 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  6 minute walk today was improved though she is hypoxemic with exertion.   - Continue to use Bipap at night, she will need to increase oxygen with exertion, would use 4 L with exertion.  - Continue Opsumit 10 mg daily.  - Add tadalafil 20 mg daily, increase to 40 mg daily at followup.   3. H/o DVT: 2018.  She has IVC filter and is not anticoagulated due to h/o anemia.  4. Type 2 diabetes: She is  now on Farxiga.  5. Chronic hypoxemic respiratory failure: Home oxygen.  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  Suspect pulmonary arterial hypertension also plays a role.  - Needs to increase oxygen to 4L/min with exertion.  6. Anemia: Fe deficiency anemia, recently got IV Fe and PRBCs. No overt bleeding.  No recent GI workup.  - I will refer her to GI, I think that she is going to need a GI workup.   Followup in 3 wks with HF pharmacist for titration of tadalafil.  See me in 6 wks.   Loralie Champagne 08/24/2019

## 2019-08-29 ENCOUNTER — Other Ambulatory Visit: Payer: Self-pay

## 2019-08-29 ENCOUNTER — Inpatient Hospital Stay: Payer: Medicare Other

## 2019-08-29 VITALS — BP 102/59 | HR 87 | Temp 97.7°F | Resp 20

## 2019-08-29 DIAGNOSIS — D509 Iron deficiency anemia, unspecified: Secondary | ICD-10-CM | POA: Diagnosis not present

## 2019-08-29 DIAGNOSIS — E611 Iron deficiency: Secondary | ICD-10-CM

## 2019-08-29 MED ORDER — SODIUM CHLORIDE 0.9 % IV SOLN
Freq: Once | INTRAVENOUS | Status: AC
  Filled 2019-08-29: qty 250

## 2019-08-29 MED ORDER — IRON SUCROSE 20 MG/ML IV SOLN
200.0000 mg | Freq: Once | INTRAVENOUS | Status: AC
  Administered 2019-08-29: 200 mg via INTRAVENOUS
  Filled 2019-08-29: qty 10

## 2019-09-05 ENCOUNTER — Other Ambulatory Visit: Payer: Self-pay

## 2019-09-05 ENCOUNTER — Inpatient Hospital Stay: Payer: Medicare Other

## 2019-09-05 VITALS — BP 105/55 | HR 74 | Temp 97.4°F | Resp 19

## 2019-09-05 DIAGNOSIS — D509 Iron deficiency anemia, unspecified: Secondary | ICD-10-CM | POA: Diagnosis not present

## 2019-09-05 DIAGNOSIS — E611 Iron deficiency: Secondary | ICD-10-CM

## 2019-09-05 MED ORDER — IRON SUCROSE 20 MG/ML IV SOLN
200.0000 mg | Freq: Once | INTRAVENOUS | Status: AC
  Administered 2019-09-05: 200 mg via INTRAVENOUS
  Filled 2019-09-05: qty 10

## 2019-09-05 MED ORDER — SODIUM CHLORIDE 0.9 % IV SOLN
Freq: Once | INTRAVENOUS | Status: AC
  Filled 2019-09-05: qty 250

## 2019-09-05 NOTE — Progress Notes (Signed)
Post venofer infusion vitals were taken and pt.'s O2-86 on 3 L. Per pt she "does not feel short of breath, feels fine, her oxygen sometimes does not reach 90% on 3L when she checks her oxygen at home, and that she sometimes has to increase her O2 to 4 L at rest." MD notified and made aware, per MD "pt can be discharge and if her oxygen drops at home or if she gets symptomatic to call her pulmonologist and PCP." RN educated pt on the importance of calling the clinic if any complications occur at home and if it is an emergency to call 911. Pt verbalized understanding. Pt stable for discharge.   Susan Barajas [employer]

## 2019-09-06 ENCOUNTER — Encounter: Payer: Self-pay | Admitting: Gastroenterology

## 2019-09-13 ENCOUNTER — Inpatient Hospital Stay: Payer: Medicare Other | Attending: Internal Medicine

## 2019-09-13 ENCOUNTER — Other Ambulatory Visit: Payer: Self-pay

## 2019-09-13 VITALS — BP 107/62 | HR 84 | Temp 98.5°F | Resp 20

## 2019-09-13 DIAGNOSIS — N189 Chronic kidney disease, unspecified: Secondary | ICD-10-CM | POA: Diagnosis not present

## 2019-09-13 DIAGNOSIS — Z79899 Other long term (current) drug therapy: Secondary | ICD-10-CM | POA: Insufficient documentation

## 2019-09-13 DIAGNOSIS — E1122 Type 2 diabetes mellitus with diabetic chronic kidney disease: Secondary | ICD-10-CM | POA: Insufficient documentation

## 2019-09-13 DIAGNOSIS — E785 Hyperlipidemia, unspecified: Secondary | ICD-10-CM | POA: Insufficient documentation

## 2019-09-13 DIAGNOSIS — Z794 Long term (current) use of insulin: Secondary | ICD-10-CM | POA: Insufficient documentation

## 2019-09-13 DIAGNOSIS — J45909 Unspecified asthma, uncomplicated: Secondary | ICD-10-CM | POA: Insufficient documentation

## 2019-09-13 DIAGNOSIS — E669 Obesity, unspecified: Secondary | ICD-10-CM | POA: Diagnosis not present

## 2019-09-13 DIAGNOSIS — J449 Chronic obstructive pulmonary disease, unspecified: Secondary | ICD-10-CM | POA: Insufficient documentation

## 2019-09-13 DIAGNOSIS — D509 Iron deficiency anemia, unspecified: Secondary | ICD-10-CM | POA: Insufficient documentation

## 2019-09-13 DIAGNOSIS — I509 Heart failure, unspecified: Secondary | ICD-10-CM | POA: Insufficient documentation

## 2019-09-13 DIAGNOSIS — I129 Hypertensive chronic kidney disease with stage 1 through stage 4 chronic kidney disease, or unspecified chronic kidney disease: Secondary | ICD-10-CM | POA: Diagnosis not present

## 2019-09-13 DIAGNOSIS — Z87891 Personal history of nicotine dependence: Secondary | ICD-10-CM | POA: Insufficient documentation

## 2019-09-13 DIAGNOSIS — E611 Iron deficiency: Secondary | ICD-10-CM

## 2019-09-13 MED ORDER — IRON SUCROSE 20 MG/ML IV SOLN
200.0000 mg | Freq: Once | INTRAVENOUS | Status: AC
  Administered 2019-09-13: 200 mg via INTRAVENOUS
  Filled 2019-09-13: qty 10

## 2019-09-13 MED ORDER — SODIUM CHLORIDE 0.9 % IV SOLN
Freq: Once | INTRAVENOUS | Status: AC
  Filled 2019-09-13: qty 250

## 2019-09-27 ENCOUNTER — Inpatient Hospital Stay: Payer: Medicare Other

## 2019-09-27 ENCOUNTER — Inpatient Hospital Stay: Payer: Medicare Other | Admitting: Internal Medicine

## 2019-09-29 ENCOUNTER — Other Ambulatory Visit: Payer: Self-pay

## 2019-09-29 ENCOUNTER — Ambulatory Visit (HOSPITAL_COMMUNITY)
Admission: RE | Admit: 2019-09-29 | Discharge: 2019-09-29 | Disposition: A | Discharge status: Home / Self Care | Payer: Medicare Other | Source: Ambulatory Visit | Attending: Internal Medicine | Admitting: Internal Medicine

## 2019-09-29 VITALS — BP 100/44 | HR 81 | Wt 191.2 lb

## 2019-09-29 DIAGNOSIS — Z9989 Dependence on other enabling machines and devices: Secondary | ICD-10-CM | POA: Diagnosis not present

## 2019-09-29 DIAGNOSIS — D509 Iron deficiency anemia, unspecified: Secondary | ICD-10-CM | POA: Diagnosis not present

## 2019-09-29 DIAGNOSIS — G4733 Obstructive sleep apnea (adult) (pediatric): Secondary | ICD-10-CM | POA: Insufficient documentation

## 2019-09-29 DIAGNOSIS — Z9981 Dependence on supplemental oxygen: Secondary | ICD-10-CM | POA: Insufficient documentation

## 2019-09-29 DIAGNOSIS — J449 Chronic obstructive pulmonary disease, unspecified: Secondary | ICD-10-CM | POA: Insufficient documentation

## 2019-09-29 DIAGNOSIS — I5032 Chronic diastolic (congestive) heart failure: Secondary | ICD-10-CM | POA: Diagnosis not present

## 2019-09-29 DIAGNOSIS — J9601 Acute respiratory failure with hypoxia: Secondary | ICD-10-CM | POA: Diagnosis not present

## 2019-09-29 DIAGNOSIS — E119 Type 2 diabetes mellitus without complications: Secondary | ICD-10-CM | POA: Diagnosis not present

## 2019-09-29 DIAGNOSIS — Z79899 Other long term (current) drug therapy: Secondary | ICD-10-CM | POA: Diagnosis not present

## 2019-09-29 DIAGNOSIS — Z86718 Personal history of other venous thrombosis and embolism: Secondary | ICD-10-CM | POA: Insufficient documentation

## 2019-09-29 DIAGNOSIS — Z794 Long term (current) use of insulin: Secondary | ICD-10-CM | POA: Insufficient documentation

## 2019-09-29 DIAGNOSIS — F17210 Nicotine dependence, cigarettes, uncomplicated: Secondary | ICD-10-CM | POA: Diagnosis not present

## 2019-09-29 DIAGNOSIS — I272 Pulmonary hypertension, unspecified: Secondary | ICD-10-CM

## 2019-09-29 MED ORDER — TADALAFIL (PAH) 20 MG PO TABS: 20.0000 mg | ORAL_TABLET | Freq: Every day | ORAL | 11 refills | Status: DC

## 2019-09-29 NOTE — Progress Notes (Addendum)
PCP: Albina Billet, MD Cardiology: Dr. Fletcher Anon HF Cardiology: Dr. Aundra Dubin  HPI: 74 y.o. with history of chronic hypoxemic respiratory failure, OSA, and chronic diastolic CHF was referred by Dr. Fletcher Anon for evaluation of CHF and pulmonary hypertension.  Patient has been on home oxygen for 2-3 years per her report. She was a smoker remotely. She had a DVT in 2018. Anticoagulation was stopped due to refractory anemia and IVC filter was placed. CT chest did not show a PE. She has OSA and uses CPAP. Last echo in 04/2019 showed normal LV EF with PASP 80 and severely dilated RV with mild RV dysfunction and D-shaped septum. She has been on Lasix for diuresis due to RV failure.    RHC/LHC was done in 06/2019, showing mild CAD; mean RA 12, PA 68/25 mean 42, mean PCWP 9, CI 4.1, PVR 4.13. V/Q scan in 3/21 did not show evidence for chronic PE. High resolution chest CT in 3/21 did not so evidence for ILD, it did show mild-moderate emphysema. PFTs (06/2019) showed moderate-severe COPD.   At last clinic visit with Dr. Aundra Dubin for pulmonary hypertension and RV failure, her weight was down 3 lbs. She reported shortness of breath only after walking a long distance. She had no orthopnea/PND.  No lightheadedness/syncope or chest pain. She has had a long history of Fe deficiency anemia and is seeing hematology who has been administering IV iron to her.  Today she returns to HF clinic for pharmacist medication titration. At last visit with MD, she was prescribed tadalafil (Alyq) 20 mg daily for [facility]. However, there was an issue with trying to obtain tadalafil from her pharmacy, therefore she never started taking tadalafil. She reports strong adherence to all of her other medications. She reports feeling a bit better compared to before. She is able to walk more before becoming tired. Shortness of breath has improved compared to before. She uses 2L of O2 at home and 3L when out and about. She explains that her O2 sats are always low in  the 70s but she is asymptomatic. She only requires one pillow to sleep at night. Patient denies swelling, orthopnea, or PND. She checks her weight at home, which as been stable around 191 lbs on furosemide 40 mg daily. No chest pain, lightheadedness, dizziness, or syncope. Patient does not check BP at home. She can conduct her ADLs such as cooking, cleaning, bathing independently. She eats a low-sodium diet.    Shortness of breath/dyspnea on exertion? no   Orthopnea/PND? no  Edema? no  Lightheadedness/dizziness? no  Daily weights at home? yes  Blood pressure/heart rate monitoring at home? no  Following low-sodium/fluid-restricted diet? yes  PH Medications: -  Opsumit 10 mg daily - Tadalafil 20 mg daily prescribed but patient did not start taking due to issue obtaining medication from pharmacy  Has the patient been experiencing any side effects to the medications prescribed?  no  Does the patient have any problems obtaining medications due to transportation or finances?   Yes - patient has [employer]. She receives her Opsumit from [facility]. She was unable to obtain tadalafil from [facility] due to insurance billing issue. Tadalafil (Alyq) 20 mg prescription now sent to [facility] to avoid future issues with obtaining PAH medications.  Understanding of regimen: good Understanding of indications: good Potential of compliance: good Patient understands to avoid NSAIDs. Patient understands to avoid decongestants.    Pertinent Lab Values 08/23/19:  Serum creatinine 0.88, BUN 13, Potassium 4.3, Sodium 139  Vital Signs:  Weight: 191 lbs (last clinic weight: 194 lbs)  Blood pressure: 100/44  Heart rate: 81  Assessment: 1. RV failure: Echo in 04/2019 showed significant RV dysfunction with severe pulmonary hypertension and D-shaped interventricular septum.  Based on RHC, it appears that she RV failure is due to pulmonary arterial hypertension rather than LV  failure (normal PCWP).  Her weight is stable with diuresis and she looks euvolemic on exam.  - Continue furosemide 40 mg daily  2. Pulmonary HTN: Echo in 1/21 with RV failure and estimate PA systolic pressure 80 mmHg.  [place] in 06/2019 showed pulmonary arterial hypertension.  Serologic workup was negative.  V/Q scan showed no chronic PE. High-resolution computed tomography showed no ILD, but was positive for mild-moderate emphysema.  PFTs were suggestive of moderate-severe COPD. Pulmonary hypertension seemed out of proportion to parenchymal disease, MD suspects mixed group 1 and group 3 PH (COPD). - Continue to use Bipap at night - Continue Opsumit 10 mg daily.  - Start tadalafil 20 mg daily since patient was unable to start previously as planned. Issue with pharmacy has been resolved. She will fill both Opsumit and tadalafil at [facility].  3. H/o DVT: 2018.  She has IVC filter and is not anticoagulated due to history of anemia. She is being followed for IV iron and blood transfusions at cancer center 4. Type 2 diabetes: She is now on Insulin, Farxiga and metformin.  5. Chronic hypoxemic respiratory failure: Home oxygen. Moderate-severe COPD on PFTs, mild-moderate emphysema on high-resolution computed tomography. Suspicion of pulmonary arterial hypertension also playing a role.  - Management of O2 per MD. Patient asymptomatic despite O2 sats in the 70s. 6. Anemia: Fe deficiency anemia, recently received IV Fe and PRBCs. No overt bleeding.  No recent GI workup.  - Dr. Aundra Dubin has referred patient to GI.    Plan: 1) Medication changes: Based on clinical presentation, vital signs and recent labs will send prescription for tadalafil (Alyq) 20 mg daily to [facility] to ensure patient receives supply 2) Follow-up: office visit with Dr. Aundra Dubin scheduled 10/28/19  Sherren Kerns, PharmD PGY1 [facility] Resident  Audry Riles, PharmD, BCPS, BCCP, CPP [facility]  Pharmacist [phone]

## 2019-09-29 NOTE — Patient Instructions (Signed)
It was a pleasure seeing you today!  MEDICATIONS: -We are changing your medications today -Start tadalafil 20 mg (1 tablet) daily. -Call if you have questions about your medications.   NEXT APPOINTMENT: Return to clinic in 1 month with Dr. Aundra Dubin.  In general, to take care of your heart failure: -Limit your fluid intake to 2 Liters (half-gallon) per day.   -Limit your salt intake to ideally 2-3 grams (2000-3000 mg) per day. -Weigh yourself daily and record, and bring that "weight diary" to your next appointment.  (Weight gain of 2-3 pounds in 1 day typically means fluid weight.) -The medications for your heart are to help your heart and help you live longer.   -Please contact us before stopping any of your heart medications.  Call the clinic at [phone] with questions or to reschedule future appointments.

## 2019-10-05 ENCOUNTER — Inpatient Hospital Stay: Payer: Medicare Other

## 2019-10-05 ENCOUNTER — Other Ambulatory Visit: Payer: Self-pay

## 2019-10-05 ENCOUNTER — Inpatient Hospital Stay (HOSPITAL_BASED_OUTPATIENT_CLINIC_OR_DEPARTMENT_OTHER): Payer: Medicare Other | Admitting: Internal Medicine

## 2019-10-05 VITALS — BP 106/60 | HR 89 | Temp 97.8°F | Resp 18

## 2019-10-05 DIAGNOSIS — D509 Iron deficiency anemia, unspecified: Secondary | ICD-10-CM | POA: Diagnosis not present

## 2019-10-05 DIAGNOSIS — E611 Iron deficiency: Secondary | ICD-10-CM

## 2019-10-05 LAB — CBC WITH DIFFERENTIAL/PLATELET
Abs Immature Granulocytes: 0.04 10*3/uL (ref 0.00–0.07)
Basophils Absolute: 0 10*3/uL (ref 0.0–0.1)
Basophils Relative: 0 %
Eosinophils Absolute: 0 10*3/uL (ref 0.0–0.5)
Eosinophils Relative: 0 %
HCT: 33.1 % — ABNORMAL LOW (ref 36.0–46.0)
Hemoglobin: 10 g/dL — ABNORMAL LOW (ref 12.0–15.0)
Immature Granulocytes: 0 %
Lymphocytes Relative: 9 %
Lymphs Abs: 0.8 10*3/uL (ref 0.7–4.0)
MCH: 29.1 pg (ref 26.0–34.0)
MCHC: 30.2 g/dL (ref 30.0–36.0)
MCV: 96.2 fL (ref 80.0–100.0)
Monocytes Absolute: 0.7 10*3/uL (ref 0.1–1.0)
Monocytes Relative: 8 %
Neutro Abs: 7.4 10*3/uL (ref 1.7–7.7)
Neutrophils Relative %: 83 %
Platelets: 275 10*3/uL (ref 150–400)
RBC: 3.44 MIL/uL — ABNORMAL LOW (ref 3.87–5.11)
RDW: 16.4 % — ABNORMAL HIGH (ref 11.5–15.5)
WBC: 9 10*3/uL (ref 4.0–10.5)
nRBC: 0 % (ref 0.0–0.2)

## 2019-10-05 MED ORDER — SODIUM CHLORIDE 0.9 % IV SOLN
Freq: Once | INTRAVENOUS | Status: AC
  Filled 2019-10-05: qty 250

## 2019-10-05 MED ORDER — IRON SUCROSE 20 MG/ML IV SOLN
200.0000 mg | Freq: Once | INTRAVENOUS | Status: AC
  Administered 2019-10-05: 200 mg via INTRAVENOUS
  Filled 2019-10-05: qty 10

## 2019-10-05 NOTE — Progress Notes (Signed)
Pt tolerated infusion well. Pt and VS stable at discharge.  

## 2019-10-05 NOTE — Progress Notes (Signed)
[facility] NOTE  Patient Care Team: Albina Billet, MD as PCP - General (Unknown Physician Specialty) Wellington Hampshire, MD as PCP - Cardiology (Cardiology) Christene Lye, MD (General Surgery) Albina Billet, MD (Internal Medicine) Schnier, Dolores Lory, MD (Vascular Surgery) Wilford Corner, MD as Consulting Physician (Gastroenterology) Cammie Sickle, MD as Consulting Physician (Hematology and Oncology)  CHIEF COMPLAINTS/PURPOSE OF CONSULTATION: Anemia   HEMATOLOGY HISTORY  # CHRONIC ANEMIA EGD/capsule-WNL-2018 [Dr.vanga]-UGIB ; colonoscopy-2018 [Elliot] ; capsule-not done; hemoglobin around 8.8; iron saturation 5% [Dr.Tate].  Poor tolerance of p.o. iron  # CHF/[Dr.Mclaheny]; COPD- on Home 2lit/ O2  HISTORY OF PRESENTING ILLNESS:  Susan Barajas 74 y.o.  female anemia/CKD iron deficiency-is here for follow-up.  Patient is currently status post IV Venofer; notes to have slight improvement of energy levels. No blood in stools or black or stools.  Review of Systems  Constitutional: Positive for malaise/fatigue. Negative for chills, diaphoresis, fever and weight loss.  HENT: Negative for nosebleeds and sore throat.   Eyes: Negative for double vision.  Respiratory: Positive for shortness of breath. Negative for cough, hemoptysis, sputum production and wheezing.   Cardiovascular: Negative for chest pain, palpitations, orthopnea and leg swelling.  Gastrointestinal: Negative for abdominal pain, blood in stool, constipation, diarrhea, heartburn, melena, nausea and vomiting.  Genitourinary: Negative for dysuria, frequency and urgency.  Musculoskeletal: Negative for back pain and joint pain.  Skin: Negative.  Negative for itching and rash.  Neurological: Negative for dizziness, tingling, focal weakness, weakness and headaches.  Endo/Heme/Allergies: Does not bruise/bleed easily.  Psychiatric/Behavioral: Negative for depression. The patient is not  nervous/anxious and does not have insomnia.     MEDICAL HISTORY:  Past Medical History:  Diagnosis Date  . Asthma 1950  . Clotting disorder (HCC)    right leg  . Diabetes ([place]) Age 44   Type 2; insulin dependent  . Glaucoma 1949  . Hyperlipidemia   . Hypertension   . Obesity, unspecified   . Personal history of tobacco use, presenting hazards to health   . Varicose veins of lower extremities with other complications     SURGICAL HISTORY: Past Surgical History:  Procedure Laterality Date  . BREAST BIOPSY Right 1991  . BREAST BIOPSY Left 2013  . COLONOSCOPY  2010   Dr. Vira Agar  . COLONOSCOPY  Jan 2016   Dr Vira Agar  . ESOPHAGOGASTRODUODENOSCOPY N/A 11/26/2016   Procedure: ESOPHAGOGASTRODUODENOSCOPY (EGD);  Surgeon: Lin Landsman, MD;  Location[facility] ENDOSCOPY;  Service: Gastroenterology;  Laterality: N/A;  . GIVENS CAPSULE STUDY N/A 11/26/2016   Procedure: GIVENS CAPSULE STUDY, if EGD negative, plan to drop capsule with scope if EGD negative;  Surgeon: Lin Landsman, MD;  Location: [facility] ENDOSCOPY;  Service: Gastroenterology;  Laterality: N/A;  . IVC FILTER INSERTION N/A 11/14/2016   Procedure: IVC Filter Insertion;  Surgeon: Katha Cabal, MD;  Location: [facility] CV LAB;  Service: Cardiovascular;  Laterality: N/A;  . RIGHT/LEFT HEART CATH AND CORONARY ANGIOGRAPHY N/A 06/30/2019   Procedure: RIGHT/LEFT HEART CATH AND CORONARY ANGIOGRAPHY;  Surgeon: Larey Dresser, MD;  Location: [facility] CV LAB;  Service: Cardiovascular;  Laterality: N/A;  . VEIN SURGERY Right 2006   Vein Closure Procedure; RF ablation of right GSV    SOCIAL HISTORY: Social History   Socioeconomic History  . Marital status: Single    Spouse name: Not on file  . Number of children: Not on file  . Years of education: Not on file  .  Highest education level: Not on file  Occupational History  . Occupation: CELL    Employer: RETIRED  Tobacco Use  . Smoking status: Former Smoker     Packs/day: 1.50    Years: 20.00    Pack years: 30.00    Quit date: 1999    Years since quitting: 22.4  . Smokeless tobacco: Never Used  Vaping Use  . Vaping Use: Never used  Substance and Sexual Activity  . Alcohol use: No    Alcohol/week: 0.0 standard drinks  . Drug use: No  . Sexual activity: Not Currently  Other Topics Concern  . Not on file  Social History Narrative   Independent at baseline. Lives at home with her brother; in [place]. Quit smoking 20 years; no alcohol.    Social Determinants of Health   Financial Resource Strain:   . Difficulty of Paying Living Expenses:   Food Insecurity:   . Worried About Charity fundraiser in the Last Year:   . Arboriculturist in the Last Year:   Transportation Needs:   . Film/video editor (Medical):   Marland Kitchen Lack of Transportation (Non-Medical):   Physical Activity:   . Days of Exercise per Week:   . Minutes of Exercise per Session:   Stress:   . Feeling of Stress :   Social Connections:   . Frequency of Communication with Friends and Family:   . Frequency of Social Gatherings with Friends and Family:   . Attends Religious Services:   . Active Member of Clubs or Organizations:   . Attends Archivist Meetings:   Marland Kitchen Marital Status:   Intimate Partner Violence:   . Fear of Current or Ex-Partner:   . Emotionally Abused:   Marland Kitchen Physically Abused:   . Sexually Abused:     FAMILY HISTORY: Family History  Problem Relation Age of Onset  . Prostate cancer Father   . Other Sister        mouth cancer  . Hypertension Mother     ALLERGIES:  has No Known Allergies.  MEDICATIONS:  Current Outpatient Medications  Medication Sig Dispense Refill  . acetaminophen (TYLENOL) 325 MG tablet Take 2 tablets (650 mg total) by mouth every 6 (six) hours as needed for mild pain (or Fever >/= 101).    Marland Kitchen aspirin 81 MG chewable tablet Chew 1 tablet (81 mg total) by mouth daily. 30 tablet 0  . dapagliflozin propanediol (FARXIGA) 10 MG  TABS tablet Take 10 mg by mouth daily.    . ferrous gluconate (FERGON) 325 MG tablet Take 325 mg by mouth in the morning and at bedtime.     . furosemide (LASIX) 40 MG tablet Take 40 mg by mouth daily.    Marland Kitchen HUMALOG KWIKPEN 100 UNIT/ML KiwkPen Inject 10 Units into the skin 2 (two) times daily.     . insulin glargine (LANTUS) 100 UNIT/ML injection Inject 32 Units into the skin daily.     Marland Kitchen latanoprost (XALATAN) 0.005 % ophthalmic solution Place 1 drop into both eyes at bedtime.     . lovastatin (MEVACOR) 20 MG tablet Take 20 mg by mouth every evening.     . macitentan (OPSUMIT) 10 MG tablet Take 1 tablet (10 mg total) by mouth daily. 30 tablet 11  . metFORMIN (GLUCOPHAGE) 1000 MG tablet Take 1,000 mg by mouth daily.     . Multiple Vitamin (MULTIVITAMIN) tablet Take 1 tablet by mouth daily.    Marland Kitchen omeprazole (PRILOSEC) 40 MG  capsule Take 40 mg by mouth daily.    . potassium chloride SA (KLOR-CON) 20 MEQ tablet Take 1 tablet (20 mEq total) by mouth daily. 90 tablet 3  . tadalafil, PAH, (ALYQ) 20 MG tablet Take 1 tablet (20 mg total) by mouth daily. 30 tablet 11  . timolol (TIMOPTIC) 0.25 % ophthalmic solution Place 1 drop into both eyes 2 (two) times daily.     . tadalafil (CIALIS) 20 MG tablet Take 1 tablet (20 mg total) by mouth daily. (Patient not taking: Reported on 09/29/2019) 90 tablet 3   No current facility-administered medications for this visit.      PHYSICAL EXAMINATION:   Vitals:   10/05/19 1335 10/05/19 1336  BP: 125/60   Pulse: 83   Resp: 20   Temp: 100.1 F (37.8 C) 99.3 F (37.4 C)  SpO2: 95%    Filed Weights   10/05/19 1335  Weight: 189 lb 9.6 oz (86 kg)    Physical Exam Constitutional:      Comments: 3 L of oxygen nasal cannula.  Walk independently.  HENT:     Head: Normocephalic and atraumatic.     Mouth/Throat:     Pharynx: No oropharyngeal exudate.  Eyes:     Pupils: Pupils are equal, round, and reactive to light.  Cardiovascular:     Rate and Rhythm:  Normal rate and regular rhythm.  Pulmonary:     Effort: No respiratory distress.     Breath sounds: No wheezing.  Abdominal:     General: Bowel sounds are normal. There is no distension.     Palpations: Abdomen is soft. There is no mass.     Tenderness: There is no abdominal tenderness. There is no guarding or rebound.  Musculoskeletal:        General: No tenderness. Normal range of motion.     Cervical back: Normal range of motion and neck supple.  Skin:    General: Skin is warm.  Neurological:     Mental Status: She is alert and oriented to person, place, and time.  Psychiatric:        Mood and Affect: Affect normal.     LABORATORY DATA:  I have reviewed the data as listed Lab Results  Component Value Date   WBC 9.0 10/05/2019   HGB 10.0 (L) 10/05/2019   HCT 33.1 (L) 10/05/2019   MCV 96.2 10/05/2019   PLT 275 10/05/2019   Recent Labs    07/07/19 1103 08/15/19 1450 08/23/19 1618  NA 135 136 139  K 4.7 4.3 4.3  CL 102 102 103  CO2 _0 GLUCOSE 163* 202* 140*  BUN _1 CREATININE 0.75 0.95 0.88  CALCIUM 9.2 8.6* 9.2  GFRNONAA >60 59* >60  GFRAA >60 >60 >60  PROT  --  6.8  --   ALBUMIN  --  3.7  --   AST  --  17  --   ALT  --  13  --   ALKPHOS  --  51  --   BILITOT  --  0.5  --      No results found.  Iron deficiency #Iron deficiency anemia- [PCP]-hemoglobin 8.8 iron saturation 5%; no ferritin.  Suggestive of iron deficiency unclear etiology.  Poor tolerance to p.o. iron. S/P IV iron infusion. Proceed with Venofer today.  #Etiology of iron deficiency is unclear; s/p previous work-up-including EGD colonoscopy; awaiting  GI eval in July 2021.   #CHF/COPD home O2-borderline respiratory status-stable.  #  DISPOSITION: # Venofer TODAY # follow up in 2 MONTHS- MD; labs- cbc; Possible venofer-Dr.B   All questions were answered. The patient knows to call the clinic with any problems, questions or concerns.    Cammie Sickle, MD 10/06/2019  2:51 PM

## 2019-10-05 NOTE — Assessment & Plan Note (Addendum)
#  Iron deficiency anemia- [PCP]-hemoglobin 8.8 iron saturation 5%; no ferritin.  Suggestive of iron deficiency unclear etiology.  Poor tolerance to p.o. iron. S/P IV iron infusion. Proceed with Venofer today.  #Etiology of iron deficiency is unclear; s/p previous work-up-including EGD colonoscopy; awaiting  GI eval in July 2021.   #CHF/COPD home O2-borderline respiratory status-stable.  # DISPOSITION: # Venofer TODAY # follow up in 2 MONTHS- MD; labs- cbc; Possible venofer-Dr.B

## 2019-10-28 ENCOUNTER — Ambulatory Visit (HOSPITAL_COMMUNITY)
Admission: RE | Admit: 2019-10-28 | Discharge: 2019-10-28 | Disposition: A | Discharge status: Home / Self Care | Payer: Medicare Other | Source: Ambulatory Visit | Attending: Cardiology | Admitting: Cardiology

## 2019-10-28 ENCOUNTER — Other Ambulatory Visit: Payer: Self-pay

## 2019-10-28 ENCOUNTER — Encounter (HOSPITAL_COMMUNITY): Payer: Self-pay | Admitting: Cardiology

## 2019-10-28 VITALS — BP 112/56 | HR 84 | Wt 191.0 lb

## 2019-10-28 DIAGNOSIS — Z87891 Personal history of nicotine dependence: Secondary | ICD-10-CM | POA: Insufficient documentation

## 2019-10-28 DIAGNOSIS — I110 Hypertensive heart disease with heart failure: Secondary | ICD-10-CM | POA: Insufficient documentation

## 2019-10-28 DIAGNOSIS — J9611 Chronic respiratory failure with hypoxia: Secondary | ICD-10-CM | POA: Diagnosis not present

## 2019-10-28 DIAGNOSIS — G4733 Obstructive sleep apnea (adult) (pediatric): Secondary | ICD-10-CM | POA: Diagnosis not present

## 2019-10-28 DIAGNOSIS — Z7982 Long term (current) use of aspirin: Secondary | ICD-10-CM | POA: Insufficient documentation

## 2019-10-28 DIAGNOSIS — Z79899 Other long term (current) drug therapy: Secondary | ICD-10-CM | POA: Diagnosis not present

## 2019-10-28 DIAGNOSIS — I50810 Right heart failure, unspecified: Secondary | ICD-10-CM | POA: Diagnosis not present

## 2019-10-28 DIAGNOSIS — Z86718 Personal history of other venous thrombosis and embolism: Secondary | ICD-10-CM | POA: Diagnosis not present

## 2019-10-28 DIAGNOSIS — Z9981 Dependence on supplemental oxygen: Secondary | ICD-10-CM | POA: Insufficient documentation

## 2019-10-28 DIAGNOSIS — Z8249 Family history of ischemic heart disease and other diseases of the circulatory system: Secondary | ICD-10-CM | POA: Insufficient documentation

## 2019-10-28 DIAGNOSIS — I272 Pulmonary hypertension, unspecified: Secondary | ICD-10-CM | POA: Diagnosis not present

## 2019-10-28 DIAGNOSIS — Z794 Long term (current) use of insulin: Secondary | ICD-10-CM | POA: Insufficient documentation

## 2019-10-28 DIAGNOSIS — Z8616 Personal history of COVID-19: Secondary | ICD-10-CM | POA: Diagnosis not present

## 2019-10-28 DIAGNOSIS — I2721 Secondary pulmonary arterial hypertension: Secondary | ICD-10-CM | POA: Diagnosis not present

## 2019-10-28 DIAGNOSIS — E119 Type 2 diabetes mellitus without complications: Secondary | ICD-10-CM | POA: Diagnosis not present

## 2019-10-28 DIAGNOSIS — E785 Hyperlipidemia, unspecified: Secondary | ICD-10-CM | POA: Diagnosis not present

## 2019-10-28 DIAGNOSIS — I5032 Chronic diastolic (congestive) heart failure: Secondary | ICD-10-CM | POA: Diagnosis not present

## 2019-10-28 LAB — BRAIN NATRIURETIC PEPTIDE: B Natriuretic Peptide: 210.8 pg/mL — ABNORMAL HIGH (ref 0.0–100.0)

## 2019-10-28 LAB — BASIC METABOLIC PANEL
Anion gap: 6 (ref 5–15)
BUN: 14 mg/dL (ref 8–23)
CO2: 25 mmol/L (ref 22–32)
Calcium: 8.6 mg/dL — ABNORMAL LOW (ref 8.9–10.3)
Chloride: 107 mmol/L (ref 98–111)
Creatinine, Ser: 0.78 mg/dL (ref 0.44–1.00)
GFR calc Af Amer: >60 mL/min (ref >60)
GFR calc non Af Amer: >60 mL/min (ref >60)
Glucose, Bld: 103 mg/dL — ABNORMAL HIGH (ref 70–99)
Potassium: 4.2 mmol/L (ref 3.5–5.1)
Sodium: 138 mmol/L (ref 135–145)

## 2019-10-28 MED ORDER — FUROSEMIDE 40 MG PO TABS: ORAL_TABLET | ORAL | 3 refills | Status: DC

## 2019-10-28 MED ORDER — TADALAFIL (PAH) 20 MG PO TABS: 40.0000 mg | ORAL_TABLET | Freq: Every day | ORAL | 11 refills | Status: DC

## 2019-10-28 NOTE — Progress Notes (Signed)
6 min walk  Starting O2 sat-96% on 6L  Ending O2 sat- 76% on 6L  Starting heartrate-73 Ending heartrate-113  How many feet walked? 400 How many meters walked?121.92  Completed full 10mn? No completed 370m   Comments: patient became short of breath during the 6 min walk sat dropped to into the 70's and I stopped the walk.

## 2019-10-28 NOTE — Patient Instructions (Signed)
INCREASE Lasix to 40 mg in the AM and 20 mg in the PM INCREASE Tadalafil to 40 mg daily INCREASE Oxygen to 4L/min  Labs today We will only contact you if something comes back abnormal or we need to make some changes. Otherwise no news is good news!  Labs needed in 10 days  Your physician recommends that you schedule a follow-up appointment in: 3 months with Dr Aundra Dubin  At the [facility], you and your health needs are our priority. As part of our continuing mission to provide you with exceptional heart care, we have created designated Provider Care Teams. These Care Teams include your primary Cardiologist (physician) and Advanced Practice Providers (APPs- Physician Assistants and Nurse Practitioners) who all work together to provide you with the care you need, when you need it.   You may see any of the following providers on your designated Care Team at your next follow up: Marland Kitchen Dr Glori Bickers . Dr Loralie Champagne . Darrick Grinder, NP . Lyda Jester, PA . Audry Riles, PharmD   Please be sure to bring in all your medications bottles to every appointment.

## 2019-10-30 NOTE — Progress Notes (Signed)
PCP: Albina Billet, MD Cardiology: Dr. Fletcher Anon HF Cardiology: Dr. Aundra Dubin  74 y.o. with history of chronic hypoxemic respiratory failure, OSA, and chronic diastolic CHF was referred by Dr. Fletcher Anon for evaluation of CHF and pulmonary hypertension.  Patient has been on home oxygen for 2-3 years per her report.  She was a smoker remotely. She had a DVT in 2018.  Anticoagulation was stopped due to refractory anemia and IVC filter was placed.  CT chest did not show a PE. She has OSA and uses CPAP.  Last echo in 1/21 showed normal LV EF with PASP 80 and severely dilated RV with mild RV dysfunction and D-shaped septum. She has been on Lasix for diuresis due to RV failure.    RHC/LHC was done in 3/21, showing mild CAD; mean RA 12, PA 68/25 mean 42, mean PCWP 9, CI 4.1, PVR 4.13 WU.  V/Q scan in 3/21 did not show evidence for chronic PE.  High resolution chest CT in 3/21 did not show evidence for ILD, it did show mild-moderate emphysema. PFTs (3/21) showed moderate-severe COPD.   She returns for followup of pulmonary hypertension and RV failure.  Weight is down 3 lbs.  Oxygen saturation is low on 3L [place], 85% at rest today.  She is now taking macitentan 10 mg daily and tadalafil 20 mg daily.  She is short of breath walking up stairs/inclines or walking long distances.  Ok in her house.  No BRBPR/melena.  No chest pain.  No lightheadedness/syncope.     6 minute walk (3/21): 182 m 6 minute walk (5/21): 243 m, oxygen saturation dropped to as low as 70s 6 minute walk (7/21): Only walked 3 min bc oxygen saturation dropped to 70s and CMA stopped walk, she went 121 m.   Labs (12/20): BNP 819 Labs (2/21): K 4.4, creatinine 0.72 Labs (3/21): RF normal, ANA negative, anti-Jo-1 negative, anti-SCL-70 negative Labs (5/21): K 4.3, creatinine 0.88 Labs (6/21): hgb 10  PMH: 1. COVID-19 PNA 8/20.  2. Type 2 diabetes 3. DVT: right leg, 2018.  - Has IVC filter, not anticoagulated due to h/o anemia.  4. HTN 5. Hyperlipidemia.   6. OSA: Uses Bipap.  7. COPD: Chronic hypoxemic respiratory failure, 3 L home oxygen.  8. Hyponatremia on HCTZ 9. Chronic diastolic CHF:  - Lexiscan Cardiolite in 2/19 was low risk no ischemia.  - Echo (1/21): EF 65-70%, no LVH, trivial MR, PASP 80 mmHg, severe RV enlargement with mildly decreased RV systolic function, D-shaped interventricular septum, dilated IVC.  - CTA chest (8/18): No evidence for PE.  10. Pulmonary hypertension: RHC/LHC 3/21 with mild CAD; mean RA 12, PA 68/25 mean 42, mean PCWP 9, CI 4.1, PVR 4.13 WU.  - Serologic workup negative.  - V/Q scan (3/21): No chronic PE.  - HRCT (3/21): No ILD, mild-moderate COPD. - PFTs (3/21): moderate-severe COPD  Social History   Socioeconomic History  . Marital status: Single    Spouse name: Not on file  . Number of children: Not on file  . Years of education: Not on file  . Highest education level: Not on file  Occupational History  . Occupation: CELL    Employer: RETIRED  Tobacco Use  . Smoking status: Former Smoker    Packs/day: 1.50    Years: 20.00    Pack years: 30.00    Quit date: 1999    Years since quitting: 22.5  . Smokeless tobacco: Never Used  Vaping Use  . Vaping Use: Never used  Substance and Sexual Activity  . Alcohol use: No    Alcohol/week: 0.0 standard drinks  . Drug use: No  . Sexual activity: Not Currently  Other Topics Concern  . Not on file  Social History Narrative   Independent at baseline. Lives at home with her brother; in [place]. Quit smoking 20 years; no alcohol.    Social Determinants of Health   Financial Resource Strain:   . Difficulty of Paying Living Expenses:   Food Insecurity:   . Worried About Charity fundraiser in the Last Year:   . Arboriculturist in the Last Year:   Transportation Needs:   . Film/video editor (Medical):   Marland Kitchen Lack of Transportation (Non-Medical):   Physical Activity:   . Days of Exercise per Week:   . Minutes of Exercise per Session:    Stress:   . Feeling of Stress :   Social Connections:   . Frequency of Communication with Friends and Family:   . Frequency of Social Gatherings with Friends and Family:   . Attends Religious Services:   . Active Member of Clubs or Organizations:   . Attends Archivist Meetings:   Marland Kitchen Marital Status:   Intimate Partner Violence:   . Fear of Current or Ex-Partner:   . Emotionally Abused:   Marland Kitchen Physically Abused:   . Sexually Abused:    Family History  Problem Relation Age of Onset  . Prostate cancer Father   . Other Sister        mouth cancer  . Hypertension Mother    ROS: All systems reviewed and negative except as per HPI.   Current Outpatient Medications  Medication Sig Dispense Refill  . acetaminophen (TYLENOL) 325 MG tablet Take 2 tablets (650 mg total) by mouth every 6 (six) hours as needed for mild pain (or Fever >/= 101).    Marland Kitchen aspirin 81 MG chewable tablet Chew 1 tablet (81 mg total) by mouth daily. 30 tablet 0  . dapagliflozin propanediol (FARXIGA) 10 MG TABS tablet Take 10 mg by mouth daily.    . ferrous gluconate (FERGON) 325 MG tablet Take 325 mg by mouth in the morning and at bedtime.     . furosemide (LASIX) 40 MG tablet Take 1 tablet (40 mg total) by mouth in the morning AND 0.5 tablets (20 mg total) every evening. 45 tablet 3  . HUMALOG KWIKPEN 100 UNIT/ML KiwkPen Inject 10 Units into the skin 2 (two) times daily.     . insulin glargine (LANTUS) 100 UNIT/ML injection Inject 32 Units into the skin daily.     Marland Kitchen latanoprost (XALATAN) 0.005 % ophthalmic solution Place 1 drop into both eyes at bedtime.     . lovastatin (MEVACOR) 20 MG tablet Take 20 mg by mouth every evening.     . macitentan (OPSUMIT) 10 MG tablet Take 1 tablet (10 mg total) by mouth daily. 30 tablet 11  . metFORMIN (GLUCOPHAGE) 1000 MG tablet Take 1,000 mg by mouth daily.     . Multiple Vitamin (MULTIVITAMIN) tablet Take 1 tablet by mouth daily.    Marland Kitchen omeprazole (PRILOSEC) 40 MG capsule Take  40 mg by mouth daily.    . potassium chloride SA (KLOR-CON) 20 MEQ tablet Take 1 tablet (20 mEq total) by mouth daily. 90 tablet 3  . tadalafil, PAH, (ALYQ) 20 MG tablet Take 2 tablets (40 mg total) by mouth daily. 60 tablet 11  . timolol (TIMOPTIC) 0.25 % ophthalmic solution Place  1 drop into both eyes 2 (two) times daily.     . tadalafil (CIALIS) 20 MG tablet Take 1 tablet (20 mg total) by mouth daily. (Patient not taking: Reported on 10/28/2019) 90 tablet 3   No current facility-administered medications for this encounter.   BP (!) 112/56   Pulse 84   Wt 86.6 kg (191 lb)   SpO2 (!) 85%   BMI 30.83 kg/m  General: NAD Neck: JVP 8-9 cm with HJR, no thyromegaly or thyroid nodule.  Lungs: Clear to auscultation bilaterally with normal respiratory effort. CV: Nondisplaced PMI.  Heart regular S1/S2, no S3/S4, no murmur.  1+ ankle edema.  No carotid bruit.  Normal pedal pulses.  Abdomen: Soft, nontender, no hepatosplenomegaly, no distention.  Skin: Intact without lesions or rashes.  Neurologic: Alert and oriented x 3.  Psych: Normal affect. Extremities: No clubbing or cyanosis.  HEENT: Normal.   Assessment/Plan: 1. RV failure: Echo in 1/21 showed significant RV dysfunction with severe pulmonary hypertension and D-shaped interventricular septum.  Based on RHC, it looks like RV failure is due to pulmonary arterial hypertension rather than LV failure (normal PCWP).  She looks mildly volume overloaded today on exam.   - Increase Lasix to 40 qam/20 qpm, BMET today and in 10 days.  2. Pulmonary HTN: Echo in 1/21 with RV failure and estimate PA systolic pressure 80 mmHg.  RHC in 3/21 showed pulmonary arterial hypertension.  Serologic workup was negative.  V/Q scan showed no chronic PE.  HRCT showed no ILD, mild-moderate emphysema.  PFTs suggestive of moderate-severe COPD. 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  6 minute walk  today was worse but was stopped after 3 minutes due to fall in oxygen saturation to 70s.     - Continue to use Bipap at night, she will need to increase oxygen with exertion, would use 6 L with exertion, 4 L at rest.  - Continue Opsumit 10 mg daily.  - Increase tadalafil to 40 mg daily.  - Check BNP today.    3. H/o DVT: 2018.  She has IVC filter and is not anticoagulated due to h/o anemia.  4. Type 2 diabetes: She is now on Farxiga.  5. Chronic hypoxemic respiratory failure: Home oxygen.  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  Suspect pulmonary arterial hypertension also plays a role.  - Needs to increase oxygen to 4L/min at rest and 6L/min with exertion.  - She has followup with pulmonary.  6. Anemia: Fe deficiency anemia, recently got IV Fe and PRBCs. No overt bleeding.  No recent GI workup.  - She has an appt to see GI.   Followup in 3 months.   Loralie Champagne 10/30/2019

## 2019-11-02 ENCOUNTER — Other Ambulatory Visit (INDEPENDENT_AMBULATORY_CARE_PROVIDER_SITE_OTHER): Payer: Medicare Other

## 2019-11-02 ENCOUNTER — Encounter: Payer: Self-pay | Admitting: Gastroenterology

## 2019-11-02 ENCOUNTER — Ambulatory Visit (INDEPENDENT_AMBULATORY_CARE_PROVIDER_SITE_OTHER): Payer: Medicare Other | Admitting: Gastroenterology

## 2019-11-02 ENCOUNTER — Other Ambulatory Visit: Payer: Self-pay

## 2019-11-02 VITALS — BP 110/56 | HR 76 | Ht 66.0 in | Wt 192.0 lb

## 2019-11-02 DIAGNOSIS — I5032 Chronic diastolic (congestive) heart failure: Secondary | ICD-10-CM

## 2019-11-02 DIAGNOSIS — I272 Pulmonary hypertension, unspecified: Secondary | ICD-10-CM

## 2019-11-02 DIAGNOSIS — Z9981 Dependence on supplemental oxygen: Secondary | ICD-10-CM | POA: Diagnosis not present

## 2019-11-02 DIAGNOSIS — D509 Iron deficiency anemia, unspecified: Secondary | ICD-10-CM

## 2019-11-02 LAB — CBC WITH DIFFERENTIAL/PLATELET
Basophils Absolute: 0 10*3/uL (ref 0.0–0.1)
Basophils Relative: 0.5 % (ref 0.0–3.0)
Eosinophils Absolute: 0.1 10*3/uL (ref 0.0–0.7)
Eosinophils Relative: 2.3 % (ref 0.0–5.0)
HCT: 27.1 % — ABNORMAL LOW (ref 36.0–46.0)
Hemoglobin: 8.5 g/dL — ABNORMAL LOW (ref 12.0–15.0)
Lymphocytes Relative: 8.9 % — ABNORMAL LOW (ref 12.0–46.0)
Lymphs Abs: 0.5 10*3/uL — ABNORMAL LOW (ref 0.7–4.0)
MCHC: 31.5 g/dL (ref 30.0–36.0)
MCV: 88.9 fl (ref 78.0–100.0)
Monocytes Absolute: 0.5 10*3/uL (ref 0.1–1.0)
Monocytes Relative: 9 % (ref 3.0–12.0)
Neutro Abs: 4.7 10*3/uL (ref 1.4–7.7)
Neutrophils Relative %: 79.3 % — ABNORMAL HIGH (ref 43.0–77.0)
Platelets: 267 10*3/uL (ref 150.0–400.0)
RBC: 3.05 Mil/uL — ABNORMAL LOW (ref 3.87–5.11)
RDW: 16.3 % — ABNORMAL HIGH (ref 11.5–15.5)
WBC: 5.9 10*3/uL (ref 4.0–10.5)

## 2019-11-02 NOTE — Progress Notes (Signed)
HPI :  74 y/o y/o female with chronic hypoxic failure, OSA, chronic diastolic heart failure, referred here by Dr. Loralie Champagne for anemia.  She has a history of anemia dating back at least to 2018 from what I can gather per review of the chart, Hgb to 8-9s at that time. No labs on chart prior to 2018. She had a DVT at that time on anticoagulation which was stopped at one point due to refractory anemia and had an IVC filter placed.   Patient has had a prior workup in the past including colonoscopy in 2016 which reportedly showed diverticulosis and hemorrhoids (no report available), as well as an EGD in August 2018 for melena / dark stools / worsening anemia which showed no overt cause of bleeding but remarkable for H pylori gastritis. She had a subsequent capsule endoscopy at that time which was read as normal. That is the last time she has had an evaluation of her bowel that she is aware of.   In March her hemoglobin was noted to be 10.1 with an MCV of 98.  On May 3 her hemoglobin had dropped to 7.2, with a ferritin value of 10, iron level of 11, iron sat of 3%.  She was seen by hematology and had IV iron infusions as well as a RBC transfusion.  Her hemoglobin as of June 23 had increased to 10.0 which is the last time she has had a checked.  She states she has been on iron pills for a very long time for anemia.  Her stools are always dark, she does not see any overt blood that she is aware of.  She has 1 bowel movement per day at baseline and this has not changed.  She does take omeprazole 40 mg a day, she denies any GERD symptoms, no dysphagia, no nausea vomiting, no abdominal pains.  She has no family history of colon cancer or GI tract malignancies she is aware of.  She does not take a blood thinner, only 81 mg of aspirin per day. She denies the use of other NSAIDs.  She has a significant cardiac history and pulmonary hypertension. Last echo in 1/21 showed normal LV EF with PASP 80 and severely  dilated RV with mild RV dysfunction. She has been on Lasix for diuresis due to RV failure. RHC/LHC was done in 3/21, showing mild CAD;  PA 68/25 mean 42, mean PCWP 9. V/Q scan in 3/21 did not show evidence for chronic PE.  High resolution chest CT in 3/21 did not show evidence for ILD, it did show mild-moderate emphysema. PFTs (3/21) showed moderate-severe COPD.   She is on 3 L [place] at baseline.  She continues to have short of breath with exertion. On BiPAP at night  Prior endoscopic work-up: Capsule endosopy 11/26/2016 - normal - Dr. Marius Ditch  EGD 11/26/2016 - normal esophagus, nodular tissue in the stomach biopsied, erythematous stomach, normal duodenum - H pylori gastritis - treated, She was given triple antibiotic therapy with clarithromycin plus PPI plus amoxicillin for 14 days  - negative for GIM  Colonoscopy 2016 - diverticulosis and hemorrhoids  Follow up H pylori stool test negative 01/2017   Past Medical History:  Diagnosis Date  . Asthma 1950  . Clotting disorder (HCC)    right leg  . 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 Glaucoma 1949  . Hyperlipidemia   . Hypertension   . Obesity, unspecified   . Personal history of  tobacco use, presenting hazards to health   . Varicose veins of lower extremities with other complications      Past Surgical History:  Procedure Laterality Date  . BREAST BIOPSY Right 1991  . BREAST BIOPSY Left 2013  . COLONOSCOPY  2010   Dr. Vira Agar  . COLONOSCOPY  Jan 2016   Dr Vira Agar  . ESOPHAGOGASTRODUODENOSCOPY N/A 11/26/2016   Procedure: ESOPHAGOGASTRODUODENOSCOPY (EGD);  Surgeon: Lin Landsman, MD;  Location: [facility] ENDOSCOPY;  Service: Gastroenterology;  Laterality: N/A;  . GIVENS CAPSULE STUDY N/A 11/26/2016   Procedure: GIVENS CAPSULE STUDY, if EGD negative, plan to drop capsule with scope if EGD negative;  Surgeon: Lin Landsman, MD;  Location[facility] END[facility]COPY;  Service: Gastroenterology;  Laterality: N/A;  . IVC FILTER  INSERTION N/A 11/14/2016   Procedure: IVC Filter Insertion;  Surgeon: Katha Cabal, MD;  Location: [place] CV LAB;  Service: Cardiovascular;  Laterality: N/A;  . RIGHT/LEFT HEART CATH AND CORONARY ANGIOGRAPHY N/A 06/30/2019   Procedure: RIGHT/LEFT HEART CATH AND CORONARY ANGIOGRAPHY;  Surgeon: Larey Dresser, MD;  Location: [place] [facility] LAB;  Service: Cardiovascular;  Laterality: N/A;  . VEIN SURGERY Right 2006   Vein Closure Procedure; RF ablation of right GSV   Family History  Problem Relation Age of Onset  . Prostate cancer Father   . Other Sister        mouth cancer  . Hypertension Mother    Social History   Tobacco Use  . Smoking status: Former Smoker    Packs/day: 1.50    Years: 20.00    Pack years: 30.00    Quit date: 1999    Years since quitting: 22.5  . Smokeless tobacco: Never Used  Vaping Use  . Vaping Use: Never used  Substance Use Topics  . Alcohol use: No    Alcohol/week: 0.0 standard drinks  . Drug use: No   Current Outpatient Medications  Medication Sig Dispense Refill  . acetaminophen (TYLENOL) 325 MG tablet Take 2 tablets (650 mg total) by mouth every 6 (six) hours as needed for mild pain (or Fever >/= 101).    Marland Kitchen aspirin 81 MG chewable tablet Chew 1 tablet (81 mg total) by mouth daily. 30 tablet 0  . dapagliflozin propanediol (FARXIGA) 10 MG TABS tablet Take 10 mg by mouth daily.    . ferrous gluconate (FERGON) 325 MG tablet Take 325 mg by mouth in the morning and at bedtime.     . furosemide (LASIX) 40 MG tablet Take 1 tablet (40 mg total) by mouth in the morning AND 0.5 tablets (20 mg total) every evening. 45 tablet 3  . HUMALOG KWIKPEN 100 UNIT/ML KiwkPen Inject 10 Units into the skin 2 (two) times daily.     . insulin glargine (LANTUS) 100 UNIT/ML injection Inject 32 Units into the skin daily.     Marland Kitchen latanoprost (XALATAN) 0.005 % ophthalmic solution Place 1 drop into both eyes at bedtime.     . lovastatin (MEVACOR) 20 MG tablet Take 20 mg by  mouth every evening.     . macitentan (OPSUMIT) 10 MG tablet Take 1 tablet (10 mg total) by mouth daily. 30 tablet 11  . metFORMIN (GLUCOPHAGE) 1000 MG tablet Take 1,000 mg by mouth daily.     . Multiple Vitamin (MULTIVITAMIN) tablet Take 1 tablet by mouth daily.    Marland Kitchen omeprazole (PRILOSEC) 40 MG capsule Take 40 mg by mouth daily.    . potassium chloride SA (KLOR-CON) 20 MEQ tablet  Take 1 tablet (20 mEq total) by mouth daily. 90 tablet 3  . tadalafil (CIALIS) 20 MG tablet Take 1 tablet (20 mg total) by mouth daily. 90 tablet 3  . tadalafil, PAH, (ALYQ) 20 MG tablet Take 2 tablets (40 mg total) by mouth daily. 60 tablet 11  . timolol (TIMOPTIC) 0.25 % ophthalmic solution Place 1 drop into both eyes 2 (two) times daily.      No current facility-administered medications for this visit.   No Known Allergies   Review of Systems: All systems reviewed and negative except where noted in HPI.    Lab Results  Component Value Date   IRON 11 (L) 08/15/2019   TIBC 448 08/15/2019   FERRITIN 10 (L) 08/15/2019    Lab Results  Component Value Date   CREATININE 0.78 10/28/2019   BUN 14 10/28/2019   NA 138 10/28/2019   K 4.2 10/28/2019   CL 107 10/28/2019   CO2 25 10/28/2019    Lab Results  Component Value Date   ALT 13 08/15/2019   AST 17 08/15/2019   ALKPHOS 51 08/15/2019   BILITOT 0.5 08/15/2019   Additional labs per H and P  Physical Exam: BP (!) 110/56   Pulse 76   Ht 5' 6" (1.676 m)   Wt 192 lb (87.1 kg)   BMI 30.99 kg/m  Constitutional: Pleasant, female in no acute distress on supplemental oxygen HEENT: Normocephalic and atraumatic. Conjunctivae are normal. No scleral icterus. Neck supple.  Cardiovascular: Normal rate, regular rhythm.  Pulmonary/chest: Effort normal, some decreased BS at bases bilaterally Abdominal: Soft, nondistended, nontender.There are no masses palpable.  Extremities: no edema Lymphadenopathy: No cervical adenopathy noted. Neurological: Alert and  oriented to person place and time. Skin: Skin is warm and dry. No rashes noted. Psychiatric: Normal mood and affect. Behavior is normal.   ASSESSMENT AND PLAN: 74 year old female here for new patient assessment the following:  Iron deficiency anemia  Supplemental oxygen dependence / CHF / Pulmonary hypertension  As above, longstanding history of anemia, dating back to the chart I see at least since 2018 although patient states she has been on oral iron for years.  She underwent a colonoscopy in 2016 which was reportedly normal, EGD and capsule endoscopy for this issue in 2018 in the setting of melena was negative other than for H. pylori for which she was treated and had eradication testing which was negative.  She is not on any blood thinners, only on 81 mg of aspirin a day, and on PPI.  Now with progressive anemia leading to transfusion and IV iron, she has chronically dark stools due to oral iron.  I discussed the situation with the patient.  Would normally recommend EGD/enteroscopy and colonoscopy for this issue given recurrence of severe anemia leading to transfusion.  However she has significant cardiopulmonary disease and is at high risk for anesthesia.  I discussed options with her.  I reassured her that given her prior work-up for this issue that was mostly unremarkable it is unlikely that she has a GI malignancy causing this which is reassuring.  If she can maintain her hemoglobin with IV iron we may consider monitoring of this, however if she cannot maintain hemoglobin despite IV iron or needs additional transfusion we may need to pursue EGD/enteroscopy and colonoscopy to be done at the hospital.  I will reach out to Dr. Aundra Dubin to assess her procedural risk at this time and see if she would be cleared for that. Another option  would be to start with a capsule endoscopy which would be much lower risk for her, get some views of the stomach and assess her small intestine, but we discussed that we  cannot do anything therapeutic with that exam. For today, will send her to the lab and reassess her Hgb, continue iron and her medications, avoid all other NSAIDs, will touch base with Dr. Aundra Dubin about her clearance for a hospital endoscopy, and get back to her. She agreed.    [facility], MD [facility]  CC: Larey Dresser, MD

## 2019-11-02 NOTE — Patient Instructions (Signed)
If you are age 74 or older, your body mass index should be between 23-30. Your Body mass index is 30.99 kg/m. If this is out of the aforementioned range listed, please consider follow up with your Primary Care Provider.  If you are age 90 or younger, your body mass index should be between 19-25. Your Body mass index is 30.99 kg/m. If this is out of the aformentioned range listed, please consider follow up with your Primary Care Provider.    Please go to the lab in the basement of our building to have lab work done as you leave today. Hit "B" for basement when you get on the elevator.  When the doors open the lab is on your left.  We will call you with the results. Thank you.  Due to recent changes in healthcare laws, you may see the results of your imaging and laboratory studies on MyChart before your provider has had a chance to review them.  We understand that in some cases there may be results that are confusing or concerning to you. Not all laboratory results come back in the same time frame and the provider may be waiting for multiple results in order to interpret others.  Please give [name] 48 hours in order for your provider to thoroughly review all the results before contacting the office for clarification of your results.   Thank you for entrusting me with your care and for choosing [facility], Dr. [place] Cellar

## 2019-11-04 ENCOUNTER — Telehealth: Payer: Self-pay

## 2019-11-04 NOTE — Telephone Encounter (Signed)
Pt has been scheduled for 11/17/2019 at 2:30 with TP at [facility] office, as there are no avavilable slots in [place]. Pt is agreeable with traveling to [place]. Pt has been provided with address and contact number for [place] office. Nothing further needed at this time.

## 2019-11-04 NOTE — Telephone Encounter (Signed)
-----  Message from Flora Lipps, MD sent at 11/03/2019  2:26 PM EDT ----- Regarding: RE: mutual Hello. I have very limited  office time since I am busy with ICU. However, patient can see anyone of our providers in office. I will relay the message to my nursing staff.     Amir Fick, can you help this patient be seen sooner than later with any provider. Thank you ----- Message ----- From: Yetta Flock, MD Sent: 11/03/2019   2:23 PM EDT To: Flora Lipps, MD Subject: FW: mutual                                     Hi, Just wanted to touch base about this mutual patient. Please see below. This patient has multiple comorbidities and unfortunately worsening anemia concerning for GI tract loss. I think she needs an EGD and colonoscopy at some point in the upcoming weeks but her respiratory status is worsening. Would you be able to evaluate her in the near future to see if anything else can be done to optimize her respiratory status? Thanks for your help, difficult case.  Richardson Landry ----- Message ----- From: Yetta Flock, MD Sent: 11/03/2019   2:18 PM EDT To: Larey Dresser, MD Subject: RE: mutual                                     Thanks for the quick feedback. I'll touch base with pulmonary, trying to avoid putting her through procedures if possible but she is having a tough time maintaining her Hgb. Will see how she does in the next few weeks. Thanks  Richardson Landry ----- Message ----- From: Larey Dresser, MD Sent: 11/03/2019   9:30 AM EDT To: Yetta Flock, MD Subject: RE: mutual                                     She is going to be high risk, not maintaining oxygen saturation well, had her increase oxygen at last clinic visit.  Pulmonary hypertension will increase risk as well. Probably ought to have her see pulmonary pre-endoscopy to make sure she is optimized.  Sedation could be a problem, think she could do the procedures if sedated as lightly as possible.   ----- Message  ----- From: Yetta Flock, MD Sent: 11/03/2019   7:22 AM EDT To: Larey Dresser, MD Subject: mutual                                         [place], [facility] you are well. Wanted to get your opinion on this patient. She has iron deficiency anemia leading to IV iron and RBC transfusion, not holding her Hgb despite IV iron. I discussed EGD/colonoscopy with her at the hospital, she remains pretty dyspnic and had her oxygen increased recently. As she's high risk for anesthesia, wanted to see how you think she would do with procedures / anesthesia at this time or if you have any other plans for her from cardiac / pulm HTN standpoint before I should proceed. Difficult case, thanks for your opinion.  Richardson Landry

## 2019-11-09 ENCOUNTER — Other Ambulatory Visit: Payer: Self-pay

## 2019-11-09 DIAGNOSIS — D509 Iron deficiency anemia, unspecified: Secondary | ICD-10-CM

## 2019-11-09 NOTE — Progress Notes (Signed)
Cbc

## 2019-11-10 ENCOUNTER — Other Ambulatory Visit (INDEPENDENT_AMBULATORY_CARE_PROVIDER_SITE_OTHER): Payer: Medicare Other

## 2019-11-10 DIAGNOSIS — D509 Iron deficiency anemia, unspecified: Secondary | ICD-10-CM

## 2019-11-10 LAB — CBC
HCT: 31.4 % — ABNORMAL LOW (ref 36.0–46.0)
Hemoglobin: 9.5 g/dL — ABNORMAL LOW (ref 12.0–15.0)
MCHC: 30.2 g/dL (ref 30.0–36.0)
MCV: 89.2 fl (ref 78.0–100.0)
Platelets: 324 10*3/uL (ref 150.0–400.0)
RBC: 3.52 Mil/uL — ABNORMAL LOW (ref 3.87–5.11)
RDW: 16.7 % — ABNORMAL HIGH (ref 11.5–15.5)
WBC: 6.3 10*3/uL (ref 4.0–10.5)

## 2019-11-14 ENCOUNTER — Other Ambulatory Visit: Payer: Self-pay

## 2019-11-14 DIAGNOSIS — D509 Iron deficiency anemia, unspecified: Secondary | ICD-10-CM

## 2019-11-17 ENCOUNTER — Encounter: Payer: Self-pay | Admitting: Adult Health

## 2019-11-17 ENCOUNTER — Ambulatory Visit: Payer: Medicare Other | Admitting: Adult Health

## 2019-11-17 ENCOUNTER — Other Ambulatory Visit: Payer: Self-pay

## 2019-11-17 DIAGNOSIS — I272 Pulmonary hypertension, unspecified: Secondary | ICD-10-CM | POA: Diagnosis not present

## 2019-11-17 DIAGNOSIS — G4733 Obstructive sleep apnea (adult) (pediatric): Secondary | ICD-10-CM | POA: Insufficient documentation

## 2019-11-17 DIAGNOSIS — J9611 Chronic respiratory failure with hypoxia: Secondary | ICD-10-CM | POA: Diagnosis not present

## 2019-11-17 DIAGNOSIS — J449 Chronic obstructive pulmonary disease, unspecified: Secondary | ICD-10-CM | POA: Diagnosis not present

## 2019-11-17 MED ORDER — STIOLTO RESPIMAT 2.5-2.5 MCG/ACT IN AERS: 2.0000 | INHALATION_SPRAY | Freq: Every day | RESPIRATORY_TRACT | 5 refills | Status: DC

## 2019-11-17 MED ORDER — STIOLTO RESPIMAT 2.5-2.5 MCG/ACT IN AERS
2.0000 | INHALATION_SPRAY | Freq: Every day | RESPIRATORY_TRACT | 0 refills | Status: DC
Start: 2019-11-17 — End: 2019-12-09

## 2019-11-17 NOTE — Assessment & Plan Note (Signed)
Severe pulmonary hypertension secondary to underlying diastolic heart failure, obstructive sleep apnea, cor pulmonale.  Patient is continue on current regimen follow-up with pulmonary hypertension clinic.  Plan  Patient Instructions  Continue on BIPAP At bedtime.  Continue on Oxygen 4l/m rest and 6l/m with activity .  Activity as tolerated  Follow up with Cardiology as planned  Add Stiolto 2 puffs  Daily, rinse after use.  Follow up with Dr. Mortimer Fries 2 months and As needed   Please contact office for sooner follow up if symptoms do not improve or worsen or seek emergency care    '

## 2019-11-17 NOTE — Assessment & Plan Note (Signed)
Patient is a former smoker.  Pulmonary function testing showed moderate airflow obstruction with reversibility. We'll try a LABA LAMA combo to see if she has any clinical benefit.  Plan  Patient Instructions  Continue on BIPAP At bedtime.  Continue on Oxygen 4l/m rest and 6l/m with activity .  Activity as tolerated  Follow up with Cardiology as planned  Add Stiolto 2 puffs  Daily, rinse after use.  Follow up with Dr. Mortimer Fries 2 months and As needed   Please contact office for sooner follow up if symptoms do not improve or worsen or seek emergency care

## 2019-11-17 NOTE — Progress Notes (Signed)
_0  ID: Susan Barajas, female    DOB: May 10, 1945, 74 y.o.   MRN: [id number]  Chief Complaint  Patient presents with  . Follow-up    OSA     Referring provider: Albina Billet, MD  HPI: 74 year old female former smoker followed for severe obstructive sleep apnea on nocturnal BiPAP and chronic respiratory failure on oxygen .  Pulmonary hypertension secondary to underlying sleep apnea, diastolic heart failure and cor pulmonale COVID-19 infection with Covid pneumonia in September 2020 Previous DVT in 2018.  Anticoagulation stopped due to refractory anemia and IVC filter placed.   TEST/EVENTS :  RHC/LHC was done in 3/21, showing mild CAD; mean RA 12, PA 68/25 mean 42, mean PCWP 9, CI 4.1, PVR 4.13 WU.  V/Q scan in 3/21 did not show evidence for chronic PE.    High resolution chest CT in 3/21 did not show evidence for ILD, it did show mild-moderate emphysema.     11/17/2019 Follow up : OSA and O2 RF  Patient returns for a 60-monthfollow-up.  Patient has underlying severe obstructive sleep apnea on nocturnal BiPAP.  Patient says she is doing well on BiPAP.  She wears her BiPAP each night.  BiPAP download shows excellent compliance with daily average usage at 7.5 hours.  Patient is on IPAP 18 and EPAP 13 cm H2O.  AHI 0.2.  Patient remains on oxygen 6 L of oxygen.  . Recently seen in cardiology and oxygen was increased to 4 L at rest and 6 L with exertion.  Feels that this has helped with her breathing and shortness of breath.  Patient has underlying diastolic heart failure , cor pulmonale and severe pulmonary hypertension.  Followed by cardiology.  She is on tadalafil and macitentan. Remains on Lasix.  Says her lower extremity swelling has been under good control.  Says that she remains active.  Light household chores.  Gets winded with moderate activity.  Covid vaccines are up-to-date  Patient is a former smoker.  Previous pulmonary function testing in March 2021 showed moderate  airflow obstruction with an FEV1 at 66%, ratio 56, FVC 91%, positive bronchodilator response.  Significant mid flow obstruction and reversibility.  DLCO 32%.  Previous CT chest showed mild to moderate emphysema.  She denies any increased cough congestion or wheezing.  No Known Allergies  Immunization History  Administered Date(s) Administered  . Fluad Quad(high Dose 65+) 03/14/2019  . Influenza Split 01/25/2018  . Moderna SARS-COVID-2 Vaccination 05/27/2019, 06/27/2019  . Pneumococcal Polysaccharide-23 04/13/2019    Past Medical History:  Diagnosis Date  . Asthma 1950  . Clotting disorder (HCC)    right leg  . [place] 74  Type 2; insulin dependent  . Glaucoma 1949  . Hyperlipidemia   . Hypertension   . Obesity, unspecified   . Personal history of tobacco use, presenting hazards to health   . Varicose veins of lower extremities with other complications     Tobacco History: Social History   Tobacco Use  Smoking Status Former Smoker  . Packs/day: 1.50  . Years: 20.00  . Pack years: 30.00  . Quit date: 1999  . Years since quitting: 22.6  Smokeless Tobacco Never Used   Counseling given: Not Answered   Outpatient Medications Prior to Visit  Medication Sig Dispense Refill  . acetaminophen (TYLENOL) 325 MG tablet Take 2 tablets (650 mg total) by mouth every 6 (six) hours as needed for mild pain (or Fever >/= 101).    .Marland Kitchen  aspirin 81 MG chewable tablet Chew 1 tablet (81 mg total) by mouth daily. 30 tablet 0  . dapagliflozin propanediol (FARXIGA) 10 MG TABS tablet Take 10 mg by mouth daily.    . ferrous gluconate (FERGON) 325 MG tablet Take 325 mg by mouth in the morning and at bedtime.     . furosemide (LASIX) 40 MG tablet Take 1 tablet (40 mg total) by mouth in the morning AND 0.5 tablets (20 mg total) every evening. 45 tablet 3  . HUMALOG KWIKPEN 100 UNIT/ML KiwkPen Inject 10 Units into the skin 2 (two) times daily.     . insulin glargine (LANTUS) 100 UNIT/ML  injection Inject 32 Units into the skin daily.     Marland Kitchen latanoprost (XALATAN) 0.005 % ophthalmic solution Place 1 drop into both eyes at bedtime.     . lovastatin (MEVACOR) 20 MG tablet Take 20 mg by mouth every evening.     . macitentan (OPSUMIT) 10 MG tablet Take 1 tablet (10 mg total) by mouth daily. 30 tablet 11  . metFORMIN (GLUCOPHAGE) 1000 MG tablet Take 1,000 mg by mouth daily.     . Multiple Vitamin (MULTIVITAMIN) tablet Take 1 tablet by mouth daily.    Marland Kitchen omeprazole (PRILOSEC) 40 MG capsule Take 40 mg by mouth daily.    . potassium chloride SA (KLOR-CON) 20 MEQ tablet Take 1 tablet (20 mEq total) by mouth daily. 90 tablet 3  . tadalafil (CIALIS) 20 MG tablet Take 1 tablet (20 mg total) by mouth daily. 90 tablet 3  . tadalafil, PAH, (ALYQ) 20 MG tablet Take 2 tablets (40 mg total) by mouth daily. 60 tablet 11  . timolol (TIMOPTIC) 0.25 % ophthalmic solution Place 1 drop into both eyes 2 (two) times daily.      No facility-administered medications prior to visit.     Review of Systems:   Constitutional:   No  weight loss, night sweats,  Fevers, chills, + fatigue, or  lassitude.  HEENT:   No headaches,  Difficulty swallowing,  Tooth/dental problems, or  Sore throat,                No sneezing, itching, ear ache, nasal congestion, post nasal drip,   CV:  No chest pain,  Orthopnea, PND, swelling in lower extremities, anasarca, dizziness, palpitations, syncope.   GI  No heartburn, indigestion, abdominal pain, nausea, vomiting, diarrhea, change in bowel habits, loss of appetite, bloody stools.   Resp:    No chest wall deformity  Skin: no rash or lesions.  GU: no dysuria, change in color of urine, no urgency or frequency.  No flank pain, no hematuria   MS:  No joint pain or swelling.  No decreased range of motion.  No back pain.    Physical Exam  BP (!) 110/56 (BP Location: Left Arm, Cuff Size: Normal)   Pulse 71   Temp (!) 97.3 F (36.3 C) (Oral)   Ht _0  (1.626 m)   Wt  190 lb (86.2 kg)   SpO2 96%   BMI 32.61 kg/m   GEN: A/Ox3; pleasant , NAD, BMI 32   HEENT:  [place]/AT,   NOSE-clear, THROAT-clear, no lesions, no postnasal drip or exudate noted.   NECK:  Supple w/ fair ROM; no JVD; normal carotid impulses w/o bruits; no thyromegaly or nodules palpated; no lymphadenopathy.    RESP  Clear  P & A; w/o, wheezes/ rales/ or rhonchi. no accessory muscle use, no dullness to percussion  CARD:  RRR, no m/r/g, tr peripheral edema, pulses intact, no cyanosis or clubbing.  GI:   Soft & nt; nml bowel sounds; no organomegaly or masses detected.   Musco: Warm bil, no deformities or joint swelling noted.   Neuro: alert, no focal deficits noted.    Skin: Warm, no lesions or rashes    Lab Results:  CBC    Component Value Date/Time   WBC 6.3 11/10/2019 0751   RBC 3.52 (L) 11/10/2019 0751   HGB 9.5 (L) 11/10/2019 0751   HCT 31.4 (L) 11/10/2019 0751   HCT 27.1 (L) 11/25/2016 2153   PLT 324.0 11/10/2019 0751   MCV 89.2 11/10/2019 0751   MCH 29.1 10/05/2019 1318   MCHC 30.2 11/10/2019 0751   RDW 16.7 (H) 11/10/2019 0751   LYMPHSABS 0.5 (L) 11/02/2019 1032   MONOABS 0.5 11/02/2019 1032   EOSABS 0.1 11/02/2019 1032   BASOSABS 0.0 11/02/2019 1032    BMET    Component Value Date/Time   NA 138 10/28/2019 1213   K 4.2 10/28/2019 1213   CL 107 10/28/2019 1213   CO2 25 10/28/2019 1213   GLUCOSE 103 (H) 10/28/2019 1213   BUN 14 10/28/2019 1213   CREATININE 0.78 10/28/2019 1213   CALCIUM 8.6 (L) 10/28/2019 1213   GFRNONAA >60 10/28/2019 1213   GFRAA >60 10/28/2019 1213    BNP    Component Value Date/Time   BNP 210.8 (H) 10/28/2019 1213    ProBNP    Component Value Date/Time   PROBNP 819.0 (H) 04/13/2019 1712    Imaging: No results found.  iron sucrose (VENOFER) injection 200 mg    Date Action Dose Route User   10/05/2019 1434 Given  Intravenous Egbert Garibaldi, RN    0.9 %  sodium chloride infusion    Date Action Dose Route User   10/05/2019  1436 Rate/Dose Change  (none) Egbert Garibaldi, RN   10/05/2019 1431 Rate/Dose Verify  (none) Egbert Garibaldi, RN   10/05/2019 1427 New Bag/Given  Intravenous Egbert Garibaldi, RN      PFT Results Latest Ref Rng & Units 07/13/2019  FVC-Pre L 2.04  FVC-Predicted Pre % 82  FVC-Post L 2.26  FVC-Predicted Post % 91  Pre FEV1/FVC % % 56  Post FEV1/FCV % % 56  FEV1-Pre L 1.14  FEV1-Predicted Pre % 59  FEV1-Post L 1.28  DLCO uncorrected ml/min/mmHg 6.67  DLCO UNC% % 32  DLVA Predicted % 49  TLC L 5.01  TLC % Predicted % 93  RV % Predicted % 127    No results found for: NITRICOXIDE      Assessment & Plan:   No problem-specific Assessment & Plan notes found for this encounter.     Rexene Edison, NP 11/17/2019

## 2019-11-17 NOTE — Assessment & Plan Note (Signed)
Recently increased oxygen with improved clinical benefit.  O2 saturation goals greater than 88 to 90%.  Plan  Patient Instructions  Continue on BIPAP At bedtime.  Continue on Oxygen 4l/m rest and 6l/m with activity .  Activity as tolerated  Follow up with Cardiology as planned  Add Stiolto 2 puffs  Daily, rinse after use.  Follow up with Dr. Mortimer Fries 2 months and As needed   Please contact office for sooner follow up if symptoms do not improve or worsen or seek emergency care

## 2019-11-17 NOTE — Assessment & Plan Note (Signed)
Severe obstructive sleep apnea with excellent control and compliance on nocturnal BiPAP.  Continue on current settings.

## 2019-11-17 NOTE — Patient Instructions (Addendum)
Continue on BIPAP At bedtime.  Continue on Oxygen 4l/m rest and 6l/m with activity .  Activity as tolerated  Follow up with Cardiology as planned  Add Stiolto 2 puffs  Daily, rinse after use.  Follow up with Dr. Mortimer Fries 2 months and As needed   Please contact office for sooner follow up if symptoms do not improve or worsen or seek emergency care

## 2019-11-17 NOTE — Addendum Note (Signed)
Addended by: Coralie Keens on: 11/17/2019 05:33 PM   Modules accepted: Orders

## 2019-11-23 ENCOUNTER — Telehealth: Payer: Self-pay | Admitting: Gastroenterology

## 2019-11-23 ENCOUNTER — Other Ambulatory Visit (INDEPENDENT_AMBULATORY_CARE_PROVIDER_SITE_OTHER): Payer: Medicare Other

## 2019-11-23 DIAGNOSIS — D509 Iron deficiency anemia, unspecified: Secondary | ICD-10-CM | POA: Diagnosis not present

## 2019-11-23 LAB — CBC WITH DIFFERENTIAL/PLATELET
Basophils Absolute: 0 10*3/uL (ref 0.0–0.1)
Basophils Relative: 0.8 % (ref 0.0–3.0)
Eosinophils Absolute: 0.2 10*3/uL (ref 0.0–0.7)
Eosinophils Relative: 3.6 % (ref 0.0–5.0)
HCT: 29.8 % — ABNORMAL LOW (ref 36.0–46.0)
Hemoglobin: 9.2 g/dL — ABNORMAL LOW (ref 12.0–15.0)
Lymphocytes Relative: 6.9 % — ABNORMAL LOW (ref 12.0–46.0)
Lymphs Abs: 0.4 10*3/uL — ABNORMAL LOW (ref 0.7–4.0)
MCHC: 30.7 g/dL (ref 30.0–36.0)
MCV: 89.8 fl (ref 78.0–100.0)
Monocytes Absolute: 0.5 10*3/uL (ref 0.1–1.0)
Monocytes Relative: 8.6 % (ref 3.0–12.0)
Neutro Abs: 5.1 10*3/uL (ref 1.4–7.7)
Neutrophils Relative %: 80.1 % — ABNORMAL HIGH (ref 43.0–77.0)
Platelets: 254 10*3/uL (ref 150.0–400.0)
RBC: 3.32 Mil/uL — ABNORMAL LOW (ref 3.87–5.11)
RDW: 17.6 % — ABNORMAL HIGH (ref 11.5–15.5)
WBC: 6.3 10*3/uL (ref 4.0–10.5)

## 2019-11-24 NOTE — Telephone Encounter (Signed)
See result note

## 2019-12-09 ENCOUNTER — Inpatient Hospital Stay: Payer: Medicare Other

## 2019-12-09 ENCOUNTER — Other Ambulatory Visit: Payer: Self-pay

## 2019-12-09 ENCOUNTER — Inpatient Hospital Stay (HOSPITAL_BASED_OUTPATIENT_CLINIC_OR_DEPARTMENT_OTHER): Payer: Medicare Other | Admitting: Internal Medicine

## 2019-12-09 ENCOUNTER — Inpatient Hospital Stay: Payer: Medicare Other | Attending: Internal Medicine

## 2019-12-09 ENCOUNTER — Encounter: Payer: Self-pay | Admitting: Internal Medicine

## 2019-12-09 VITALS — BP 109/60 | HR 66 | Resp 18

## 2019-12-09 VITALS — BP 110/50 | HR 75 | Temp 97.9°F | Resp 20 | Ht 64.0 in | Wt 188.4 lb

## 2019-12-09 DIAGNOSIS — I509 Heart failure, unspecified: Secondary | ICD-10-CM | POA: Diagnosis not present

## 2019-12-09 DIAGNOSIS — N189 Chronic kidney disease, unspecified: Secondary | ICD-10-CM | POA: Diagnosis not present

## 2019-12-09 DIAGNOSIS — J449 Chronic obstructive pulmonary disease, unspecified: Secondary | ICD-10-CM | POA: Diagnosis not present

## 2019-12-09 DIAGNOSIS — E1122 Type 2 diabetes mellitus with diabetic chronic kidney disease: Secondary | ICD-10-CM | POA: Diagnosis not present

## 2019-12-09 DIAGNOSIS — I129 Hypertensive chronic kidney disease with stage 1 through stage 4 chronic kidney disease, or unspecified chronic kidney disease: Secondary | ICD-10-CM | POA: Insufficient documentation

## 2019-12-09 DIAGNOSIS — Z794 Long term (current) use of insulin: Secondary | ICD-10-CM | POA: Insufficient documentation

## 2019-12-09 DIAGNOSIS — Z87891 Personal history of nicotine dependence: Secondary | ICD-10-CM | POA: Diagnosis not present

## 2019-12-09 DIAGNOSIS — D509 Iron deficiency anemia, unspecified: Secondary | ICD-10-CM | POA: Diagnosis not present

## 2019-12-09 DIAGNOSIS — E611 Iron deficiency: Secondary | ICD-10-CM

## 2019-12-09 DIAGNOSIS — E669 Obesity, unspecified: Secondary | ICD-10-CM | POA: Insufficient documentation

## 2019-12-09 DIAGNOSIS — J45909 Unspecified asthma, uncomplicated: Secondary | ICD-10-CM | POA: Diagnosis not present

## 2019-12-09 DIAGNOSIS — E785 Hyperlipidemia, unspecified: Secondary | ICD-10-CM | POA: Insufficient documentation

## 2019-12-09 DIAGNOSIS — Z7982 Long term (current) use of aspirin: Secondary | ICD-10-CM | POA: Diagnosis not present

## 2019-12-09 DIAGNOSIS — Z79899 Other long term (current) drug therapy: Secondary | ICD-10-CM | POA: Insufficient documentation

## 2019-12-09 LAB — CBC WITH DIFFERENTIAL/PLATELET
Abs Immature Granulocytes: 0.02 10*3/uL (ref 0.00–0.07)
Basophils Absolute: 0 10*3/uL (ref 0.0–0.1)
Basophils Relative: 1 %
Eosinophils Absolute: 0 10*3/uL (ref 0.0–0.5)
Eosinophils Relative: 0 %
HCT: 31.3 % — ABNORMAL LOW (ref 36.0–46.0)
Hemoglobin: 9.5 g/dL — ABNORMAL LOW (ref 12.0–15.0)
Immature Granulocytes: 0 %
Lymphocytes Relative: 10 %
Lymphs Abs: 0.6 10*3/uL — ABNORMAL LOW (ref 0.7–4.0)
MCH: 27.7 pg (ref 26.0–34.0)
MCHC: 30.4 g/dL (ref 30.0–36.0)
MCV: 91.3 fL (ref 80.0–100.0)
Monocytes Absolute: 0.5 10*3/uL (ref 0.1–1.0)
Monocytes Relative: 8 %
Neutro Abs: 5.4 10*3/uL (ref 1.7–7.7)
Neutrophils Relative %: 81 %
Platelets: 256 10*3/uL (ref 150–400)
RBC: 3.43 MIL/uL — ABNORMAL LOW (ref 3.87–5.11)
RDW: 15.9 % — ABNORMAL HIGH (ref 11.5–15.5)
WBC: 6.6 10*3/uL (ref 4.0–10.5)
nRBC: 0 % (ref 0.0–0.2)

## 2019-12-09 MED ORDER — SODIUM CHLORIDE 0.9 % IV SOLN
Freq: Once | INTRAVENOUS | Status: AC
  Filled 2019-12-09: qty 250

## 2019-12-09 MED ORDER — IRON SUCROSE 20 MG/ML IV SOLN
200.0000 mg | Freq: Once | INTRAVENOUS | Status: AC
  Administered 2019-12-09: 200 mg via INTRAVENOUS
  Filled 2019-12-09: qty 10

## 2019-12-09 NOTE — Progress Notes (Signed)
[place] NOTE  Patient Care Team: Albina Billet, MD as PCP - General (Unknown Physician Specialty) Wellington Hampshire, MD as PCP - Cardiology (Cardiology) Christene Lye, MD (General Surgery) Albina Billet, MD (Internal Medicine) Schnier, Dolores Lory, MD (Vascular Surgery) Wilford Corner, MD as Consulting Physician (Gastroenterology) Cammie Sickle, MD as Consulting Physician (Hematology and Oncology)  CHIEF COMPLAINTS/PURPOSE OF CONSULTATION: Anemia   HEMATOLOGY HISTORY  # CHRONIC ANEMIA EGD/capsule-WNL-2018 [Dr.vanga]-UGIB ; colonoscopy-2018 [[facility]] ; capsule-not done; hemoglobin around 8.8; iron saturation 5% [Dr.Tate].  Poor tolerance of p.o. iron; [[facility] July 2021]-high risk of anesthesia  # CHF/[Dr.Mclaheny]; COPD- on Home 2lit/ O2  HISTORY OF PRESENTING ILLNESS:  Susan Barajas 74 y.o.  female anemia/CKD iron deficiency-is here for follow-up.  The patient has been followed up by GI-considered to be high risk for endoscopies given her cardiopulmonary status.  Patient continues to be on home O2.  No blood in stools.  Her energy levels are adequate.   Review of Systems  Constitutional: Positive for malaise/fatigue. Negative for chills, diaphoresis, fever and weight loss.  HENT: Negative for nosebleeds and sore throat.   Eyes: Negative for double vision.  Respiratory: Positive for shortness of breath. Negative for cough, hemoptysis, sputum production and wheezing.   Cardiovascular: Negative for chest pain, palpitations, orthopnea and leg swelling.  Gastrointestinal: Negative for abdominal pain, blood in stool, constipation, diarrhea, heartburn, melena, nausea and vomiting.  Genitourinary: Negative for dysuria, frequency and urgency.  Musculoskeletal: Negative for back pain and joint pain.  Skin: Negative.  Negative for itching and rash.  Neurological: Negative for dizziness, tingling, focal weakness, weakness and  headaches.  Endo/Heme/Allergies: Does not bruise/bleed easily.  Psychiatric/Behavioral: Negative for depression. The patient is not nervous/anxious and does not have insomnia.     MEDICAL HISTORY:  Past Medical History:  Diagnosis Date  . Asthma 1950  . Clotting disorder (HCC)    right leg  . Diabetes ([place]) Age 89   Type 2; insulin dependent  . Glaucoma 1949  . Hyperlipidemia   . Hypertension   . Obesity, unspecified   . Personal history of tobacco use, presenting hazards to health   . Varicose veins of lower extremities with other complications     SURGICAL HISTORY: Past Surgical History:  Procedure Laterality Date  . BREAST BIOPSY Right 1991  . BREAST BIOPSY Left 2013  . COLONOSCOPY  2010   Dr. Vira Agar  . COLONOSCOPY  Jan 2016   Dr Vira Agar  . ESOPHAGOGASTRODUODENOSCOPY N/A 11/26/2016   Procedure: ESOPHAGOGASTRODUODENOSCOPY (EGD);  Surgeon: Lin Landsman, MD;  Location: [facility] ENDOSCOPY;  Service: Gastroenterology;  Laterality: N/A;  . GIVENS CAPSULE STUDY N/A 11/26/2016   Procedure: GIVENS CAPSULE STUDY, if EGD negative, plan to drop capsule with scope if EGD negative;  Surgeon: Lin Landsman, MD;  Location: [facility] ENDOSCOPY;  Service: Gastroenterology;  Laterality: N/A;  . IVC FILTER INSERTION N/A 11/14/2016   Procedure: IVC Filter Insertion;  Surgeon: Katha Cabal, MD;  Location: [facility] CV LAB;  Service: Cardiovascular;  Laterality: N/A;  . RIGHT/LEFT HEART CATH AND CORONARY ANGIOGRAPHY N/A 06/30/2019   Procedure: RIGHT/LEFT HEART CATH AND CORONARY ANGIOGRAPHY;  Surgeon: Larey Dresser, MD;  Location: [facility] LAB;  Service: Cardiovascular;  Laterality: N/A;  . VEIN SURGERY Right 2006   Vein Closure Procedure; RF ablation of right GSV    SOCIAL HISTORY: Social History   Socioeconomic History  . Marital status: Single    Spouse  name: Not on file  . Number of children: 2  . Years of education: Not on file  . Highest education level: Not on  file  Occupational History  . Occupation: CELL    Employer: RETIRED  Tobacco Use  . Smoking status: Former Smoker    Packs/day: 1.50    Years: 20.00    Pack years: 30.00    Quit date: 1999    Years since quitting: 22.6  . Smokeless tobacco: Never Used  Vaping Use  . Vaping Use: Never used  Substance and Sexual Activity  . Alcohol use: No    Alcohol/week: 0.0 standard drinks  . Drug use: No  . Sexual activity: Not Currently  Other Topics Concern  . Not on file  Social History Narrative   Independent at baseline. Lives at home with her brother; in [place]. Quit smoking 20 years; no alcohol.    Social Determinants of Health   Financial Resource Strain:   . Difficulty of Paying Living Expenses: Not on file  Food Insecurity:   . Worried About Charity fundraiser in the Last Year: Not on file  . Ran Out of Food in the Last Year: Not on file  Transportation Needs:   . Lack of Transportation (Medical): Not on file  . Lack of Transportation (Non-Medical): Not on file  Physical Activity:   . Days of Exercise per Week: Not on file  . Minutes of Exercise per Session: Not on file  Stress:   . Feeling of Stress : Not on file  Social Connections:   . Frequency of Communication with Friends and Family: Not on file  . Frequency of Social Gatherings with Friends and Family: Not on file  . Attends Religious Services: Not on file  . Active Member of Clubs or Organizations: Not on file  . Attends Archivist Meetings: Not on file  . Marital Status: Not on file  Intimate Partner Violence:   . Fear of Current or Ex-Partner: Not on file  . Emotionally Abused: Not on file  . Physically Abused: Not on file  . Sexually Abused: Not on file    FAMILY HISTORY: Family History  Problem Relation Age of Onset  . Prostate cancer Father   . Other Sister        mouth cancer  . Hypertension Mother     ALLERGIES:  has No Known Allergies.  MEDICATIONS:  Current Outpatient  Medications  Medication Sig Dispense Refill  . acetaminophen (TYLENOL) 325 MG tablet Take 2 tablets (650 mg total) by mouth every 6 (six) hours as needed for mild pain (or Fever >/= 101).    Marland Kitchen aspirin 81 MG chewable tablet Chew 1 tablet (81 mg total) by mouth daily. 30 tablet 0  . dapagliflozin propanediol (FARXIGA) 10 MG TABS tablet Take 10 mg by mouth daily.    . ferrous gluconate (FERGON) 325 MG tablet Take 325 mg by mouth in the morning and at bedtime.     . furosemide (LASIX) 40 MG tablet Take 1 tablet (40 mg total) by mouth in the morning AND 0.5 tablets (20 mg total) every evening. 45 tablet 3  . HUMALOG KWIKPEN 100 UNIT/ML KiwkPen Inject 10 Units into the skin 2 (two) times daily.     . insulin glargine (LANTUS) 100 UNIT/ML injection Inject 32 Units into the skin daily.     Marland Kitchen latanoprost (XALATAN) 0.005 % ophthalmic solution Place 1 drop into both eyes at bedtime.     Marland Kitchen  lovastatin (MEVACOR) 20 MG tablet Take 20 mg by mouth every evening.     . macitentan (OPSUMIT) 10 MG tablet Take 1 tablet (10 mg total) by mouth daily. 30 tablet 11  . metFORMIN (GLUCOPHAGE) 1000 MG tablet Take 1,000 mg by mouth daily.     . Multiple Vitamin (MULTIVITAMIN) tablet Take 1 tablet by mouth daily.    Marland Kitchen omeprazole (PRILOSEC) 40 MG capsule Take 40 mg by mouth daily.    . potassium chloride SA (KLOR-CON) 20 MEQ tablet Take 1 tablet (20 mEq total) by mouth daily. 90 tablet 3  . tadalafil, PAH, (ALYQ) 20 MG tablet Take 2 tablets (40 mg total) by mouth daily. 60 tablet 11  . timolol (TIMOPTIC) 0.25 % ophthalmic solution Place 1 drop into both eyes 2 (two) times daily.     . Tiotropium Bromide-Olodaterol (STIOLTO RESPIMAT) 2.5-2.5 MCG/ACT AERS Inhale 2 puffs into the lungs daily. 1 g 5   No current facility-administered medications for this visit.      PHYSICAL EXAMINATION:   Vitals:   12/09/19 1344  BP: (!) 110/50  Pulse: 75  Resp: 20  Temp: 97.9 F (36.6 C)   Filed Weights   12/09/19 1339  Weight:  188 lb 6.4 oz (85.5 kg)    Physical Exam Constitutional:      Comments: 3 L of oxygen nasal cannula.  Walk independently.  HENT:     Head: Normocephalic and atraumatic.     Mouth/Throat:     Pharynx: No oropharyngeal exudate.  Eyes:     Pupils: Pupils are equal, round, and reactive to light.  Cardiovascular:     Rate and Rhythm: Normal rate and regular rhythm.  Pulmonary:     Effort: No respiratory distress.     Breath sounds: No wheezing.  Abdominal:     General: Bowel sounds are normal. There is no distension.     Palpations: Abdomen is soft. There is no mass.     Tenderness: There is no abdominal tenderness. There is no guarding or rebound.  Musculoskeletal:        General: No tenderness. Normal range of motion.     Cervical back: Normal range of motion and neck supple.  Skin:    General: Skin is warm.  Neurological:     Mental Status: She is alert and oriented to person, place, and time.  Psychiatric:        Mood and Affect: Affect normal.     LABORATORY DATA:  I have reviewed the data as listed Lab Results  Component Value Date   WBC 6.6 12/09/2019   HGB 9.5 (L) 12/09/2019   HCT 31.3 (L) 12/09/2019   MCV 91.3 12/09/2019   PLT 256 12/09/2019   Recent Labs    08/15/19 1450 08/23/19 1618 10/28/19 1213  NA 136 139 138  K 4.3 4.3 4.2  CL 102 103 107  CO2 _0 GLUCOSE 202* 140* 103*  BUN _1 CREATININE 0.95 0.88 0.78  CALCIUM 8.6* 9.2 8.6*  GFRNONAA 59* >60 >60  GFRAA >60 >60 >60  PROT 6.8  --   --   ALBUMIN 3.7  --   --   AST 17  --   --   ALT 13  --   --   ALKPHOS 51  --   --   BILITOT 0.5  --   --      No results found.  Iron deficiency #Iron deficiency anemia- [PCP]ron saturation 5%;  no ferritin.  Suggestive of iron deficiency unclear etiology.  Poor tolerance to p.o. iron. S/P IV iron infusion.  Today hemoglobin is 9.5.  Proceed with Venofer today.  #Etiology of iron deficiency is unclear; s/p previous work-up-including EGD  colonoscopy GI evaluation; [place] 2021 July-high risk for endoscopies.  #CHF/COPD home O2-borderline respiratory status-stable.  # DISPOSITION: # Venofer TODAY # follow up in 2 MONTHS- MD; labs- cbc;bmp; iorn studies/ferritin- Possible venofer-Dr.B   All questions were answered. The patient knows to call the clinic with any problems, questions or concerns.    Cammie Sickle, MD 12/09/2019 2:01 PM

## 2019-12-09 NOTE — Assessment & Plan Note (Signed)
#  Iron deficiency anemia- [PCP]ron saturation 5%; no ferritin.  Suggestive of iron deficiency unclear etiology.  Poor tolerance to p.o. iron. S/P IV iron infusion.  Today hemoglobin is 9.5.  Proceed with Venofer today.  #Etiology of iron deficiency is unclear; s/p previous work-up-including EGD colonoscopy GI evaluation; [place] 2021 July-high risk for endoscopies.  #CHF/COPD home O2-borderline respiratory status-stable.  # DISPOSITION: # Venofer TODAY # follow up in 2 MONTHS- MD; labs- cbc;bmp; iorn studies/ferritin- Possible venofer-Dr.B

## 2019-12-13 ENCOUNTER — Telehealth: Payer: Self-pay

## 2019-12-13 NOTE — Telephone Encounter (Signed)
Called and spoke to pt.  She will go to the lab in the basement of our building one day this week.

## 2019-12-13 NOTE — Telephone Encounter (Signed)
-----  Message from Roetta Sessions, Felicity sent at 11/24/2019 11:39 AM EDT ----- Regarding: cbc due Call and remind pt to go to the lab for CBC

## 2019-12-14 ENCOUNTER — Other Ambulatory Visit (INDEPENDENT_AMBULATORY_CARE_PROVIDER_SITE_OTHER): Payer: Medicare Other

## 2019-12-14 DIAGNOSIS — D509 Iron deficiency anemia, unspecified: Secondary | ICD-10-CM

## 2019-12-14 LAB — CBC WITH DIFFERENTIAL/PLATELET
Basophils Absolute: 0.1 10*3/uL (ref 0.0–0.1)
Basophils Relative: 1.1 % (ref 0.0–3.0)
Eosinophils Absolute: 0.2 10*3/uL (ref 0.0–0.7)
Eosinophils Relative: 4 % (ref 0.0–5.0)
HCT: 34.3 % — ABNORMAL LOW (ref 36.0–46.0)
Hemoglobin: 10.7 g/dL — ABNORMAL LOW (ref 12.0–15.0)
Lymphocytes Relative: 8.4 % — ABNORMAL LOW (ref 12.0–46.0)
Lymphs Abs: 0.4 10*3/uL — ABNORMAL LOW (ref 0.7–4.0)
MCHC: 31.2 g/dL (ref 30.0–36.0)
MCV: 91.5 fl (ref 78.0–100.0)
Monocytes Absolute: 0.4 10*3/uL (ref 0.1–1.0)
Monocytes Relative: 8.3 % (ref 3.0–12.0)
Neutro Abs: 3.7 10*3/uL (ref 1.4–7.7)
Neutrophils Relative %: 78.2 % — ABNORMAL HIGH (ref 43.0–77.0)
Platelets: 234 10*3/uL (ref 150.0–400.0)
RBC: 3.74 Mil/uL — ABNORMAL LOW (ref 3.87–5.11)
RDW: 17.9 % — ABNORMAL HIGH (ref 11.5–15.5)
WBC: 4.7 10*3/uL (ref 4.0–10.5)

## 2019-12-15 ENCOUNTER — Other Ambulatory Visit: Payer: Self-pay

## 2019-12-15 DIAGNOSIS — D509 Iron deficiency anemia, unspecified: Secondary | ICD-10-CM

## 2020-01-12 ENCOUNTER — Encounter: Payer: Self-pay | Admitting: Internal Medicine

## 2020-01-12 ENCOUNTER — Other Ambulatory Visit: Payer: Self-pay

## 2020-01-12 ENCOUNTER — Ambulatory Visit (INDEPENDENT_AMBULATORY_CARE_PROVIDER_SITE_OTHER): Payer: Medicare Other | Admitting: Internal Medicine

## 2020-01-12 VITALS — BP 96/42 | HR 74 | Temp 97.1°F | Ht 64.0 in | Wt 191.2 lb

## 2020-01-12 DIAGNOSIS — I5032 Chronic diastolic (congestive) heart failure: Secondary | ICD-10-CM

## 2020-01-12 DIAGNOSIS — J9611 Chronic respiratory failure with hypoxia: Secondary | ICD-10-CM | POA: Diagnosis not present

## 2020-01-12 DIAGNOSIS — I2729 Other secondary pulmonary hypertension: Secondary | ICD-10-CM | POA: Diagnosis not present

## 2020-01-12 DIAGNOSIS — G4733 Obstructive sleep apnea (adult) (pediatric): Secondary | ICD-10-CM

## 2020-01-12 NOTE — Patient Instructions (Signed)
A +++  KEEP UP THE GREAT WORK WITH CPAP THERAPY!!!  Continue Oxygen as prescribed Follow up with cardiologist as scheduled

## 2020-01-12 NOTE — Progress Notes (Signed)
STUDIES:  CT chest 11/17/2016 I have Independently reviewed images of  CT chest   \ Interpretation: No evidence of PE, possible little enlarged pulmonary artery, right peritracheal mild reactive adenopathy Probable underlying interstitial edema  Ultrasound on 11/14/16 + Right leg DVT   Recent echo, ordered by pulmonology, demonstrated worsening pulmonary hypertension with an RVSP of 80, right atrial pressure of 15, and evidence of severely dilated right atrium with left bowing of the interatrial septum.  BNP at that time elevated 819.  Name: [place] Anna Jeanpaul MRN: [id number] DOB: [birth year]     CHIEF COMPLAINT:  Follow up OSA     HISTORY OF PRESENT ILLNESS:   Patient with a history of severe sleep apnea with AHI of 97 Previous compliance report shows excellent compliance with AHI down to 0.4  Patient has OSA obesity with cor pulmonale and elevated PA pressures  On BiPAP 18/13 Full facemask    Daytime sleepiness and fatigue much improved Patient uses oxygen therapy 24/7 2 L nasal cannula She has been doing this for several years   No evidence of heart failure at this time No evidence or signs of infection at this time No respiratory distress No fevers, chills, nausea, vomiting, diarrhea No evidence of lower extremity edema No evidence hemoptysis   PREVIOUS DX OF COVID 19 INFECTION PNEUMONIA 12/2018     Review of Systems:  Gen:  Denies  fever, sweats, chills weight loss  HEENT: Denies blurred vision, double vision, ear pain, eye pain, hearing loss, nose bleeds, sore throat Cardiac:  No dizziness, chest pain or heaviness, chest tightness,edema, No JVD Resp:   No cough, -sputum production, CHRONIC +shortness of breath,-wheezing, -hemoptysis,  Gi: Denies swallowing difficulty, stomach pain, nausea or vomiting, diarrhea, constipation, bowel incontinence Gu:  Denies bladder incontinence, burning urine Ext:   Denies Joint pain, stiffness or swelling Skin: Denies   skin rash, easy bruising or bleeding or hives Endoc:  Denies polyuria, polydipsia , polyphagia or weight change Psych:   Denies depression, insomnia or hallucinations  Other:  All other systems negative     Physical Examination:   General Appearance: No distress  Neuro:without focal findings,  speech normal,  HEENT: PERRLA, EOM intact.   Pulmonary: normal breath sounds, No wheezing.  CardiovascularNormal S1,S2.  No m/r/g.   Abdomen: Benign, Soft, non-tender. Renal:  No costovertebral tenderness  GU:  Not performed at this time. Endoc: No evident thyromegaly Skin:   warm, no rashes, no ecchymosis  Extremities: normal, no cyanosis, clubbing. PSYCHIATRIC: Mood, affect within normal limits.   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 and chronic diastolic heart failure  HF pEF/severe pulmonary hypertension  Shortness of breath or dyspnea exertion seem to have remained the same over the last 6 months No evidence of exacerbation at this time No indication for antibiotics or prednisone at this time No signs of infection   Severe OSA with excessive daytime sleepiness Continue BiPAP therapy as prescribed Patient uses and benefits from therapy She needs this for survival  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 with elevated PA pressures from diastolic heart failure and sleep apnea Most likely related to severe OSA  Chronic hypoxic respiratory failure from diastolic heart failure and cor pulmonale with OSA Continue oxygen  therapy as prescribed Patient uses oxygen and benefits from oxygen therapy  Chronic diastolic heart failure Follow-up cardiology as recommended Lasix as tolerated   Obesity last  weight 222 pounds-->191 pounds -recommend significant weight loss -recommend changing diet  Deconditioned state -Recommend increased daily activity and exercise     COVID-19 EDUCATION: The signs and symptoms of COVID-19 were discussed with the patient and how to seek care for testing.  The importance of social distancing was discussed today. Hand Washing Techniques and avoid touching face was advised.  MEDICATION ADJUSTMENTS/LABS AND TESTS ORDERED: CONTINUE BIPAP AS PRESCIBED CONTINUE OXYGEN AS PRESCRIBED    CURRENT MEDICATIONS REVIEWED AT LENGTH WITH PATIENT TODAY   Patient satisfied with Plan of action and management. All questions answered   Follow up in 1 years  Total time spent 23 mins   Jermine Bibbee Patricia Pesa, M.D.  [facility]  Medical Director [place] Director [facility] Cardio-Pulmonary Department

## 2020-01-16 ENCOUNTER — Telehealth: Payer: Self-pay

## 2020-01-16 NOTE — Telephone Encounter (Signed)
-----  Message from Yevette Edwards, RN sent at 12/15/2019  8:58 AM EDT ----- Regarding: Labs Repeat cbc, order in epic

## 2020-01-16 NOTE — Telephone Encounter (Signed)
Called patient's listed home number, vm full, unable to leave message.   Called mobile phone, no vm set up.

## 2020-01-17 ENCOUNTER — Other Ambulatory Visit (HOSPITAL_COMMUNITY): Payer: Self-pay | Admitting: Cardiology

## 2020-01-17 NOTE — Telephone Encounter (Signed)
Spoke with patient to remind her that she is due for lab work. Advised patient that she can go by the lab at her convenience, no appt necessary. She is aware that the lab is open between 7:30 am - 5 pm, Monday through Friday.

## 2020-01-18 ENCOUNTER — Other Ambulatory Visit (INDEPENDENT_AMBULATORY_CARE_PROVIDER_SITE_OTHER): Payer: Medicare Other

## 2020-01-18 DIAGNOSIS — D509 Iron deficiency anemia, unspecified: Secondary | ICD-10-CM

## 2020-01-18 LAB — CBC WITH DIFFERENTIAL/PLATELET
Basophils Absolute: 0 10*3/uL (ref 0.0–0.1)
Basophils Relative: 0.5 % (ref 0.0–3.0)
Eosinophils Absolute: 0.3 10*3/uL (ref 0.0–0.7)
Eosinophils Relative: 5.2 % — ABNORMAL HIGH (ref 0.0–5.0)
HCT: 29.1 % — ABNORMAL LOW (ref 36.0–46.0)
Hemoglobin: 8.9 g/dL — ABNORMAL LOW (ref 12.0–15.0)
Lymphocytes Relative: 8.1 % — ABNORMAL LOW (ref 12.0–46.0)
Lymphs Abs: 0.4 10*3/uL — ABNORMAL LOW (ref 0.7–4.0)
MCHC: 30.6 g/dL (ref 30.0–36.0)
MCV: 88.4 fl (ref 78.0–100.0)
Monocytes Absolute: 0.5 10*3/uL (ref 0.1–1.0)
Monocytes Relative: 9.1 % (ref 3.0–12.0)
Neutro Abs: 4 10*3/uL (ref 1.4–7.7)
Neutrophils Relative %: 77.1 % — ABNORMAL HIGH (ref 43.0–77.0)
Platelets: 302 10*3/uL (ref 150.0–400.0)
RBC: 3.29 Mil/uL — ABNORMAL LOW (ref 3.87–5.11)
RDW: 16.6 % — ABNORMAL HIGH (ref 11.5–15.5)
WBC: 5.1 10*3/uL (ref 4.0–10.5)

## 2020-01-19 ENCOUNTER — Other Ambulatory Visit: Payer: Self-pay

## 2020-01-19 DIAGNOSIS — D509 Iron deficiency anemia, unspecified: Secondary | ICD-10-CM

## 2020-01-25 ENCOUNTER — Telehealth: Payer: Self-pay

## 2020-01-25 NOTE — Telephone Encounter (Signed)
-----  Message from Yevette Edwards, RN sent at 01/19/2020 11:35 AM EDT ----- Regarding: Labs Repeat CBC, order in epic

## 2020-01-25 NOTE — Telephone Encounter (Signed)
Spoke with patient to remind her that she is due for repeat labs at this time. Advised that she can go by at her convenience later today or tomorrow between 7:30 AM-5 PM, no appt necessary. Pt is aware that we are closed on Friday. Pt verbalized understanding and has no concerns at this time.

## 2020-01-26 ENCOUNTER — Telehealth: Payer: Self-pay | Admitting: *Deleted

## 2020-01-26 ENCOUNTER — Telehealth: Payer: Self-pay | Admitting: Internal Medicine

## 2020-01-26 ENCOUNTER — Other Ambulatory Visit (INDEPENDENT_AMBULATORY_CARE_PROVIDER_SITE_OTHER): Payer: Medicare Other

## 2020-01-26 ENCOUNTER — Other Ambulatory Visit: Payer: Self-pay

## 2020-01-26 DIAGNOSIS — D509 Iron deficiency anemia, unspecified: Secondary | ICD-10-CM

## 2020-01-26 DIAGNOSIS — E611 Iron deficiency: Secondary | ICD-10-CM

## 2020-01-26 LAB — CBC WITH DIFFERENTIAL/PLATELET
Basophils Absolute: 0 10*3/uL (ref 0.0–0.1)
Basophils Relative: 0.6 % (ref 0.0–3.0)
Eosinophils Absolute: 0.2 10*3/uL (ref 0.0–0.7)
Eosinophils Relative: 2.7 % (ref 0.0–5.0)
HCT: 27.4 % — ABNORMAL LOW (ref 36.0–46.0)
Hemoglobin: 8.2 g/dL — ABNORMAL LOW (ref 12.0–15.0)
Lymphocytes Relative: 6.7 % — ABNORMAL LOW (ref 12.0–46.0)
Lymphs Abs: 0.5 10*3/uL — ABNORMAL LOW (ref 0.7–4.0)
MCHC: 30 g/dL (ref 30.0–36.0)
MCV: 88 fl (ref 78.0–100.0)
Monocytes Absolute: 0.4 10*3/uL (ref 0.1–1.0)
Monocytes Relative: 6.3 % (ref 3.0–12.0)
Neutro Abs: 6 10*3/uL (ref 1.4–7.7)
Neutrophils Relative %: 83.7 % — ABNORMAL HIGH (ref 43.0–77.0)
Platelets: 273 10*3/uL (ref 150.0–400.0)
RBC: 3.11 Mil/uL — ABNORMAL LOW (ref 3.87–5.11)
RDW: 16.5 % — ABNORMAL HIGH (ref 11.5–15.5)
WBC: 7.2 10*3/uL (ref 4.0–10.5)

## 2020-01-26 NOTE — Telephone Encounter (Signed)
Reviewed the labs patient's hemoglobin is 8.3; trending down recommend IV iron ASAP.  Colette-schedule IV iron only ASAP; 1 week from then-MD; CBC; hold tube; venofer.  Thanks, GB

## 2020-01-26 NOTE — Addendum Note (Signed)
Addended by: Gloris Ham on: 01/26/2020 04:19 PM   Modules accepted: Orders

## 2020-01-26 NOTE — Telephone Encounter (Signed)
See md phone note

## 2020-01-26 NOTE — Telephone Encounter (Signed)
Patient called requesting that her appointment be moved up. She is currently scheduled for 02/10/20 per request of PCP  CBC w/Diff Order: [id number] Status:  Final result Visible to patient:  No (scheduled for 01/26/2020 11:50 AM) Next appt:  02/10/2020 at 12:45 PM in Oncology ([facility]) Dx:  Iron deficiency anemia, unspecified i...  2 Result Notes  Ref Range & Units 08:30 8 d ago  WBC 4.0 - 10.5 K/uL 7.2  5.1   RBC 3.87 - 5.11 Mil/uL 3.11Low  3.29Low   Hemoglobin 12.0 - 15.0 g/dL 8.2 Repeated and verified X2.Low  8.9 Repeated and verified X2.Low   HCT 36 - 46 % 27.4Low  29.1Low   MCV 78.0 - 100.0 fl 88.0  88.4   MCHC 30.0 - 36.0 g/dL 30.0  30.6   RDW 11.5 - 15.5 % 16.5High  16.6High   Platelets 150 - 400 K/uL 273.0  302.0   Neutrophils Relative % 43 - 77 % 83.7High  77.1High   Lymphocytes Relative 12 - 46 % 6.7 Repeated and verified X2.Low  8.1Low   Monocytes Relative 3 - 12 % 6.3  9.1   Eosinophils Relative 0 - 5 % 2.7  5.2High   Basophils Relative 0 - 3 % 0.6  0.5   Neutro Abs 1.4 - 7.7 K/uL 6.0  4.0   Lymphs Abs 0.7 - 4.0 K/uL 0.5Low  0.4Low   Monocytes Absolute 0.1 - 1.0 K/uL 0.4  0.5   Eosinophils Absolute 0.0 - 0.7 K/uL 0.2  0.3   Basophils Absolute 0.0 - 0.1 K/uL 0.0  0.0   Resulting Agency  [facility]      Specimen Collected: 01/26/20 08:30 Last Resulted: 01/26/20 10:50     Lab Flowsheet   Order Details   View Encounter   Lab and Collection Details   Routing   Result History       Result Care Coordination  Result Notes  Yevette Edwards, [place]  01/26/2020 1:26 PM EDT Back to Top    Spoke with patient regarding her lab results, pt denies any bleeding symptoms or fatigue. Advised patient that if she has obvious signs of bleeding she will need to go to the hospital for evaluation. Advised patient to follow up with her hematologist to see if she can move her appt up. She is aware that she will need repeat  labs in 1 week. Advised that if IV iron is not helping then we may have to proceed with procedures at the hospital, advised that cardiology has deemed her high risk for anesthesia that is why we have been holding off. Pt verbalized understanding and has no concerns at this time.  Lab order and reminder in epic.    Yetta Flock, MD  01/26/2020 1:11 PM EDT     Herbert Seta can you let the patient know unfortunately her Hgb has been going down, now to 8.2. Does she have any bleeding symptoms? If she is having obvious bleeding she may need to go to the hospital. Otherwise if not I think she is scheduled for IV iron at the end of the month but I think she needs it ASAP if she can call her hematologist. I recommend she have another CBC in 1 week otherwise. If IV iron does not help she may need to have procedures done at the hospital but we have been holding off since she is high risk for anesthesia after discussion with her cardiologist. Hopefully IV can help as it has  in the past. Thanks  Patient Communication  01/26/2020 11:50 AM

## 2020-01-31 ENCOUNTER — Inpatient Hospital Stay: Payer: Medicare Other | Attending: Internal Medicine

## 2020-01-31 ENCOUNTER — Other Ambulatory Visit: Payer: Self-pay

## 2020-01-31 VITALS — BP 106/68 | HR 72

## 2020-01-31 DIAGNOSIS — Z794 Long term (current) use of insulin: Secondary | ICD-10-CM | POA: Diagnosis not present

## 2020-01-31 DIAGNOSIS — Z8042 Family history of malignant neoplasm of prostate: Secondary | ICD-10-CM | POA: Insufficient documentation

## 2020-01-31 DIAGNOSIS — Z79899 Other long term (current) drug therapy: Secondary | ICD-10-CM | POA: Diagnosis not present

## 2020-01-31 DIAGNOSIS — E119 Type 2 diabetes mellitus without complications: Secondary | ICD-10-CM | POA: Insufficient documentation

## 2020-01-31 DIAGNOSIS — D509 Iron deficiency anemia, unspecified: Secondary | ICD-10-CM | POA: Diagnosis not present

## 2020-01-31 DIAGNOSIS — Z87891 Personal history of nicotine dependence: Secondary | ICD-10-CM | POA: Diagnosis not present

## 2020-01-31 DIAGNOSIS — I509 Heart failure, unspecified: Secondary | ICD-10-CM | POA: Diagnosis not present

## 2020-01-31 DIAGNOSIS — Z7982 Long term (current) use of aspirin: Secondary | ICD-10-CM | POA: Insufficient documentation

## 2020-01-31 DIAGNOSIS — I10 Essential (primary) hypertension: Secondary | ICD-10-CM | POA: Diagnosis not present

## 2020-01-31 DIAGNOSIS — Z808 Family history of malignant neoplasm of other organs or systems: Secondary | ICD-10-CM | POA: Diagnosis not present

## 2020-01-31 DIAGNOSIS — E611 Iron deficiency: Secondary | ICD-10-CM

## 2020-01-31 DIAGNOSIS — E785 Hyperlipidemia, unspecified: Secondary | ICD-10-CM | POA: Diagnosis not present

## 2020-01-31 DIAGNOSIS — J449 Chronic obstructive pulmonary disease, unspecified: Secondary | ICD-10-CM | POA: Insufficient documentation

## 2020-01-31 DIAGNOSIS — E669 Obesity, unspecified: Secondary | ICD-10-CM | POA: Insufficient documentation

## 2020-01-31 DIAGNOSIS — J45909 Unspecified asthma, uncomplicated: Secondary | ICD-10-CM | POA: Diagnosis not present

## 2020-01-31 MED ORDER — SODIUM CHLORIDE 0.9 % IV SOLN
Freq: Once | INTRAVENOUS | Status: AC
  Filled 2020-01-31: qty 250

## 2020-01-31 MED ORDER — IRON SUCROSE 20 MG/ML IV SOLN
200.0000 mg | Freq: Once | INTRAVENOUS | Status: AC
  Administered 2020-01-31: 200 mg via INTRAVENOUS
  Filled 2020-01-31: qty 10

## 2020-02-02 ENCOUNTER — Telehealth: Payer: Self-pay

## 2020-02-02 NOTE — Telephone Encounter (Signed)
-----  Message from Yevette Edwards, RN sent at 01/26/2020  1:23 PM EDT ----- Regarding: Labs Repeat CBC, order in epic

## 2020-02-02 NOTE — Telephone Encounter (Signed)
Spoke with patient, advised that she is due for repeat labs this week. Pt states that she will not be able to go today and will be going out of town tomorrow. Pt requested that I give her a call on Monday to come in for labs. Pt states that she had an iron infusion on Tuesday 10/19.   New reminder in epic.

## 2020-02-06 ENCOUNTER — Telehealth: Payer: Self-pay

## 2020-02-06 NOTE — Telephone Encounter (Signed)
-----  Message from Yevette Edwards, RN sent at 02/02/2020  8:21 AM EDT ----- Regarding: FW: Labs  ----- Message ----- From: Yevette Edwards, RN Sent: 02/02/2020 To: Yevette Edwards, RN Subject: Labs                                           Repeat CBC, order in epic

## 2020-02-06 NOTE — Telephone Encounter (Signed)
Spoke with patient to remind her that she is due for repeat labs. Patient states that she will be by in the morning to have labs drawn. Patient verbalized understanding and has no other concerns at this time.

## 2020-02-07 ENCOUNTER — Other Ambulatory Visit: Payer: Self-pay

## 2020-02-07 ENCOUNTER — Other Ambulatory Visit (INDEPENDENT_AMBULATORY_CARE_PROVIDER_SITE_OTHER): Payer: Medicare Other

## 2020-02-07 DIAGNOSIS — D509 Iron deficiency anemia, unspecified: Secondary | ICD-10-CM

## 2020-02-07 LAB — CBC WITH DIFFERENTIAL/PLATELET
Basophils Absolute: 0 10*3/uL (ref 0.0–0.1)
Basophils Relative: 0.7 % (ref 0.0–3.0)
Eosinophils Absolute: 0.3 10*3/uL (ref 0.0–0.7)
Eosinophils Relative: 4.6 % (ref 0.0–5.0)
HCT: 31.2 % — ABNORMAL LOW (ref 36.0–46.0)
Hemoglobin: 9.6 g/dL — ABNORMAL LOW (ref 12.0–15.0)
Lymphocytes Relative: 7.8 % — ABNORMAL LOW (ref 12.0–46.0)
Lymphs Abs: 0.4 10*3/uL — ABNORMAL LOW (ref 0.7–4.0)
MCHC: 30.8 g/dL (ref 30.0–36.0)
MCV: 89.1 fl (ref 78.0–100.0)
Monocytes Absolute: 0.4 10*3/uL (ref 0.1–1.0)
Monocytes Relative: 7.1 % (ref 3.0–12.0)
Neutro Abs: 4.4 10*3/uL (ref 1.4–7.7)
Neutrophils Relative %: 79.8 % — ABNORMAL HIGH (ref 43.0–77.0)
Platelets: 244 10*3/uL (ref 150.0–400.0)
RBC: 3.5 Mil/uL — ABNORMAL LOW (ref 3.87–5.11)
RDW: 17.9 % — ABNORMAL HIGH (ref 11.5–15.5)
WBC: 5.5 10*3/uL (ref 4.0–10.5)

## 2020-02-09 ENCOUNTER — Other Ambulatory Visit: Payer: Self-pay

## 2020-02-09 DIAGNOSIS — E611 Iron deficiency: Secondary | ICD-10-CM

## 2020-02-10 ENCOUNTER — Other Ambulatory Visit: Payer: Self-pay

## 2020-02-10 ENCOUNTER — Inpatient Hospital Stay: Payer: Medicare Other

## 2020-02-10 ENCOUNTER — Inpatient Hospital Stay (HOSPITAL_BASED_OUTPATIENT_CLINIC_OR_DEPARTMENT_OTHER): Payer: Medicare Other | Admitting: Internal Medicine

## 2020-02-10 ENCOUNTER — Other Ambulatory Visit: Payer: Self-pay | Admitting: Licensed Clinical Social Worker

## 2020-02-10 VITALS — BP 122/55 | HR 78 | Temp 98.5°F | Resp 16 | Ht 64.0 in | Wt 190.0 lb

## 2020-02-10 DIAGNOSIS — D509 Iron deficiency anemia, unspecified: Secondary | ICD-10-CM | POA: Diagnosis not present

## 2020-02-10 DIAGNOSIS — D500 Iron deficiency anemia secondary to blood loss (chronic): Secondary | ICD-10-CM | POA: Diagnosis not present

## 2020-02-10 DIAGNOSIS — E611 Iron deficiency: Secondary | ICD-10-CM

## 2020-02-10 LAB — BASIC METABOLIC PANEL
Anion gap: 7 (ref 5–15)
BUN: 25 mg/dL — ABNORMAL HIGH (ref 8–23)
CO2: 27 mmol/L (ref 22–32)
Calcium: 8.8 mg/dL — ABNORMAL LOW (ref 8.9–10.3)
Chloride: 103 mmol/L (ref 98–111)
Creatinine, Ser: 0.74 mg/dL (ref 0.44–1.00)
GFR, Estimated: >60 mL/min (ref >60)
Glucose, Bld: 155 mg/dL — ABNORMAL HIGH (ref 70–99)
Potassium: 4 mmol/L (ref 3.5–5.1)
Sodium: 137 mmol/L (ref 135–145)

## 2020-02-10 LAB — URINALYSIS, COMPLETE (UACMP) WITH MICROSCOPIC
Bilirubin Urine: NEGATIVE
Glucose, UA: >=500 mg/dL — AB
Hgb urine dipstick: NEGATIVE
Ketones, ur: NEGATIVE mg/dL
Leukocytes,Ua: NEGATIVE
Nitrite: NEGATIVE
Protein, ur: NEGATIVE mg/dL
Specific Gravity, Urine: 1.017 (ref 1.005–1.030)
Squamous Epithelial / HPF: NONE SEEN (ref 0–5)
pH: 6 (ref 5.0–8.0)

## 2020-02-10 LAB — CBC WITH DIFFERENTIAL/PLATELET
Abs Immature Granulocytes: 0.01 10*3/uL (ref 0.00–0.07)
Basophils Absolute: 0 10*3/uL (ref 0.0–0.1)
Basophils Relative: 1 %
Eosinophils Absolute: 0 10*3/uL (ref 0.0–0.5)
Eosinophils Relative: 0 %
HCT: 29.5 % — ABNORMAL LOW (ref 36.0–46.0)
Hemoglobin: 8.6 g/dL — ABNORMAL LOW (ref 12.0–15.0)
Immature Granulocytes: 0 %
Lymphocytes Relative: 11 %
Lymphs Abs: 0.6 10*3/uL — ABNORMAL LOW (ref 0.7–4.0)
MCH: 27.2 pg (ref 26.0–34.0)
MCHC: 29.2 g/dL — ABNORMAL LOW (ref 30.0–36.0)
MCV: 93.4 fL (ref 80.0–100.0)
Monocytes Absolute: 0.6 10*3/uL (ref 0.1–1.0)
Monocytes Relative: 10 %
Neutro Abs: 4.2 10*3/uL (ref 1.7–7.7)
Neutrophils Relative %: 78 %
Platelets: 224 10*3/uL (ref 150–400)
RBC: 3.16 MIL/uL — ABNORMAL LOW (ref 3.87–5.11)
RDW: 16.9 % — ABNORMAL HIGH (ref 11.5–15.5)
WBC: 5.4 10*3/uL (ref 4.0–10.5)
nRBC: 0 % (ref 0.0–0.2)

## 2020-02-10 LAB — IRON AND TIBC
Iron: 48 ug/dL (ref 28–170)
Saturation Ratios: 13 % (ref 10.4–31.8)
TIBC: 381 ug/dL (ref 250–450)
UIBC: 333 ug/dL

## 2020-02-10 LAB — FERRITIN: Ferritin: 27 ng/mL (ref 11–307)

## 2020-02-10 MED ORDER — SODIUM CHLORIDE 0.9 % IV SOLN
Freq: Once | INTRAVENOUS | Status: AC
  Filled 2020-02-10: qty 250

## 2020-02-10 MED ORDER — IRON SUCROSE 20 MG/ML IV SOLN
200.0000 mg | Freq: Once | INTRAVENOUS | Status: AC
  Administered 2020-02-10: 200 mg via INTRAVENOUS
  Filled 2020-02-10: qty 10

## 2020-02-10 NOTE — Progress Notes (Signed)
[place] NOTE  Patient Care Team: Albina Billet, MD as PCP - General (Unknown Physician Specialty) Wellington Hampshire, MD as PCP - Cardiology (Cardiology) Christene Lye, MD (General Surgery) Albina Billet, MD (Internal Medicine) Schnier, Dolores Lory, MD (Vascular Surgery) Wilford Corner, MD as Consulting Physician (Gastroenterology) Cammie Sickle, MD as Consulting Physician (Hematology and Oncology)  CHIEF COMPLAINTS/PURPOSE OF CONSULTATION: Anemia   HEMATOLOGY HISTORY  # CHRONIC ANEMIA EGD/capsule-WNL-2018 [Dr.vanga]-UGIB ; colonoscopy-2018 [Elliot] ; capsule-not done; hemoglobin around 8.8; iron saturation 5% [Dr.Tate].  Poor tolerance of p.o. iron; [[facility] July 2021]-high risk of anesthesia  # CHF/[Dr.Mclaheny]; COPD [Dr.Kasa]- on Home 4lit/ O2  HISTORY OF PRESENTING ILLNESS:  Susan Barajas 74 y.o.  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  Patient was recently evaluated PCP her to hemoglobin down to 8.5.  Patient gets short of breath easily with minimal exertion.  No swelling the legs.  No blood in stools no black-colored stools.   Review of Systems  Constitutional: Positive for malaise/fatigue. Negative for chills, diaphoresis, fever and weight loss.  HENT: Negative for nosebleeds and sore throat.   Eyes: Negative for double vision.  Respiratory: Positive for shortness of breath. Negative for cough, hemoptysis, sputum production and wheezing.   Cardiovascular: Negative for chest pain, palpitations, orthopnea and leg swelling.  Gastrointestinal: Negative for abdominal pain, blood in stool, constipation, diarrhea, heartburn, melena, nausea and vomiting.  Genitourinary: Negative for dysuria, frequency and urgency.  Musculoskeletal: Negative for back pain and joint pain.  Skin: Negative.  Negative for itching and rash.  Neurological: Negative for  dizziness, tingling, focal weakness, weakness and headaches.  Endo/Heme/Allergies: Does not bruise/bleed easily.  Psychiatric/Behavioral: Negative for depression. The patient is not nervous/anxious and does not have insomnia.     MEDICAL HISTORY:  Past Medical History:  Diagnosis Date  . Asthma 1950  . Clotting disorder (HCC)    right leg  . 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 Glaucoma 1949  . Hyperlipidemia   . Hypertension   . Obesity, unspecified   . Personal history of tobacco use, presenting hazards to health   . Varicose veins of lower extremities with other complications     SURGICAL HISTORY: Past Surgical History:  Procedure Laterality Date  . BREAST BIOPSY Right 1991  . BREAST BIOPSY Left 2013  . COLONOSCOPY  2010   Dr. Vira Agar  . COLONOSCOPY  Jan 2016   Dr Vira Agar  . ESOPHAGOGASTRODUODENOSCOPY N/A 11/26/2016   Procedure: ESOPHAGOGASTRODUODENOSCOPY (EGD);  Surgeon: Lin Landsman, MD;  [facility] ENDOSCOPY;  Service: Gastroenterology;  Laterality: N/A;  . GIVENS CAPSULE STUDY N/A 11/26/2016   Procedure: GIVENS CAPSULE STUDY, if EGD negative, plan to drop capsule with scope if EGD negative;  Surgeon: Lin Landsman, MD;  Location: [facility] ENDOSCOPY;  Service: Gastroenterology;  Laterality: N/A;  . IVC FILTER INSERTION N/A 11/14/2016   Procedure: IVC Filter Insertion;  Surgeon: Katha Cabal, MD;  Location: [place] CV LAB;  Service: Cardiovascular;  Laterality: N/A;  . RIGHT/LEFT HEART CATH AND CORONARY ANGIOGRAPHY N/A 06/30/2019   Procedure: RIGHT/LEFT HEART CATH AND CORONARY ANGIOGRAPHY;  Surgeon: Larey Dresser, MD;  Location: [place] CV LAB;  Service: Cardiovascular;  Laterality: N/A;  . VEIN SURGERY Right 2006   Vein Closure Procedure; RF ablation of right GSV    SOCIAL HISTORY: Social  History   Socioeconomic History  . Marital status: Single    Spouse name: Not on file  . Number of children: 2  . Years of education:  Not on file  . Highest education level: Not on file  Occupational History  . Occupation: CELL    Employer: RETIRED  Tobacco Use  . Smoking status: Former Smoker    Packs/day: 1.50    Years: 20.00    Pack years: 30.00    Quit date: 1999    Years since quitting: 22.8  . Smokeless tobacco: Never Used  Vaping Use  . Vaping Use: Never used  Substance and Sexual Activity  . Alcohol use: No    Alcohol/week: 0.0 standard drinks  . Drug use: No  . Sexual activity: Not Currently  Other Topics Concern  . Not on file  Social History Narrative   Independent at baseline. Lives at home with her brother; in [place]. Quit smoking 20 years; no alcohol.    Social Determinants of Health   Financial Resource Strain:   . Difficulty of Paying Living Expenses: Not on file  Food Insecurity:   . Worried About Charity fundraiser in the Last Year: Not on file  . Ran Out of Food in the Last Year: Not on file  Transportation Needs:   . Lack of Transportation (Medical): Not on file  . Lack of Transportation (Non-Medical): Not on file  Physical Activity:   . Days of Exercise per Week: Not on file  . Minutes of Exercise per Session: Not on file  Stress:   . Feeling of Stress : Not on file  Social Connections:   . Frequency of Communication with Friends and Family: Not on file  . Frequency of Social Gatherings with Friends and Family: Not on file  . Attends Religious Services: Not on file  . Active Member of Clubs or Organizations: Not on file  . Attends Archivist Meetings: Not on file  . Marital Status: Not on file  Intimate Partner Violence:   . Fear of Current or Ex-Partner: Not on file  . Emotionally Abused: Not on file  . Physically Abused: Not on file  . Sexually Abused: Not on file    FAMILY HISTORY: Family History  Problem Relation Age of Onset  . Prostate cancer Father   . Other Sister        mouth cancer  . Hypertension Mother     ALLERGIES:  has No Known  Allergies.  MEDICATIONS:  Current Outpatient Medications  Medication Sig Dispense Refill  . acetaminophen (TYLENOL) 325 MG tablet Take 2 tablets (650 mg total) by mouth every 6 (six) hours as needed for mild pain (or Fever >/= 101).    Marland Kitchen aspirin 81 MG chewable tablet Chew 1 tablet (81 mg total) by mouth daily. 30 tablet 0  . FARXIGA 10 MG TABS tablet TAKE 1 TABLET BY MOUTH ONCE DAILY BEFORE BREAKFAST 90 tablet 0  . ferrous gluconate (FERGON) 325 MG tablet Take 325 mg by mouth in the morning and at bedtime.     . furosemide (LASIX) 40 MG tablet Take 1 tablet (40 mg total) by mouth in the morning AND 0.5 tablets (20 mg total) every evening. 45 tablet 3  . HUMALOG KWIKPEN 100 UNIT/ML KiwkPen Inject 10 Units into the skin 2 (two) times daily.     . insulin glargine (LANTUS) 100 UNIT/ML injection Inject 32 Units into the skin daily.     Marland Kitchen latanoprost (XALATAN) 0.005 %  ophthalmic solution Place 1 drop into both eyes at bedtime.     . lovastatin (MEVACOR) 20 MG tablet Take 20 mg by mouth every evening.     . macitentan (OPSUMIT) 10 MG tablet Take 1 tablet (10 mg total) by mouth daily. 30 tablet 11  . metFORMIN (GLUCOPHAGE) 1000 MG tablet Take 1,000 mg by mouth daily.     . Multiple Vitamin (MULTIVITAMIN) tablet Take 1 tablet by mouth daily.    Marland Kitchen omeprazole (PRILOSEC) 40 MG capsule Take 40 mg by mouth daily.    . potassium chloride SA (KLOR-CON) 20 MEQ tablet Take 1 tablet (20 mEq total) by mouth daily. 90 tablet 3  . tadalafil, PAH, (ALYQ) 20 MG tablet Take 2 tablets (40 mg total) by mouth daily. 60 tablet 11  . timolol (TIMOPTIC) 0.25 % ophthalmic solution Place 1 drop into both eyes 2 (two) times daily.     . Tiotropium Bromide-Olodaterol (STIOLTO RESPIMAT) 2.5-2.5 MCG/ACT AERS Inhale 2 puffs into the lungs daily. 1 g 5   No current facility-administered medications for this visit.   Facility-Administered Medications Ordered in Other Visits  Medication Dose Route Frequency Provider Last Rate  Last Admin  . 0.9 %  sodium chloride infusion   Intravenous Once Charlaine Dalton R, MD      . iron sucrose (VENOFER) injection 200 mg  200 mg Intravenous Once Charlaine Dalton R, MD          PHYSICAL EXAMINATION:   Vitals:   02/10/20 1314  BP: (!) 122/55  Pulse: 78  Resp: 16  Temp: 98.5 F (36.9 C)  SpO2: (!) 88%   Filed Weights   02/10/20 1314  Weight: 190 lb (86.2 kg)    Physical Exam Constitutional:      Comments: 3 L of oxygen nasal cannula.  Walk independently.  HENT:     Head: Normocephalic and atraumatic.     Mouth/Throat:     Pharynx: No oropharyngeal exudate.  Eyes:     Pupils: Pupils are equal, round, and reactive to light.  Cardiovascular:     Rate and Rhythm: Normal rate and regular rhythm.  Pulmonary:     Effort: No respiratory distress.     Breath sounds: No wheezing.  Abdominal:     General: Bowel sounds are normal. There is no distension.     Palpations: Abdomen is soft. There is no mass.     Tenderness: There is no abdominal tenderness. There is no guarding or rebound.  Musculoskeletal:        General: No tenderness. Normal range of motion.     Cervical back: Normal range of motion and neck supple.  Skin:    General: Skin is warm.  Neurological:     Mental Status: She is alert and oriented to person, place, and time.  Psychiatric:        Mood and Affect: Affect normal.     LABORATORY DATA:  I have reviewed the data as listed Lab Results  Component Value Date   WBC 5.4 02/10/2020   HGB 8.6 (L) 02/10/2020   HCT 29.5 (L) 02/10/2020   MCV 93.4 02/10/2020   PLT 224 02/10/2020   Recent Labs    08/15/19 1450 08/15/19 1450 08/23/19 1618 10/28/19 1213 02/10/20 1246  NA 136   < > 139 138 137  K 4.3   < > 4.3 4.2 4.0  CL 102   < > 103 107 103  CO2 26   < > 25 25 27  GLUCOSE 202*   < > 140* 103* 155*  BUN 15   < > 13 14 25*  CREATININE 0.95   < > 0.88 0.78 0.74  CALCIUM 8.6*   < > 9.2 8.6* 8.8*  GFRNONAA 59*   < > >60 >60 >60   GFRAA >60  --  >60 >60  --   PROT 6.8  --   --   --   --   ALBUMIN 3.7  --   --   --   --   AST 17  --   --   --   --   ALT 13  --   --   --   --   ALKPHOS 51  --   --   --   --   BILITOT 0.5  --   --   --   --    < > = values in this interval not displayed.     No results found.  Iron deficiency #Iron deficiency anemia- [may 2021]-on saturation 3%; Ferritin-5.  Suggestive of iron deficiency unclear etiology.  Poor tolerance to p.o. iron. S/P IV iron infusion.  Today hemoglobin is 8.5.   Proceed with Venofer today.   #Etiology of iron deficiency is unclear; s/p previous work-up-including EGD colonoscopy GI evaluation; [place] 2021 July-high risk for endoscopies.  #CHF/COPD home O2-borderline respiratory status-stable.  # DISPOSITION:check UA.   # Venofer TODAY;  # Venofer weekly x 3  # CT A/P ASAP # follow up in 3 weeks- MD; labs- cbc/venofer-Dr.B   All questions were answered. The patient knows to call the clinic with any problems, questions or concerns.    Cammie Sickle, MD 02/10/2020 1:46 PM

## 2020-02-10 NOTE — Assessment & Plan Note (Addendum)
#  Iron deficiency anemia- [may 2021]-on saturation 3%; Ferritin-5.  Suggestive of iron deficiency unclear etiology.  Poor tolerance to p.o. iron. S/P IV iron infusion.  Today hemoglobin is 8.5. Proceed with Venofer today.   #Etiology of iron deficiency is unclear; s/p previous work-up-including EGD colonoscopy GI evaluation; [place] 2021 July-high risk for endoscopies.  Recommend CT scan abdomen pelvis; order UA.  #CHF/COPD home O2-borderline respiratory status-stable.  # DISPOSITION:check UA.   # Venofer TODAY;  # Venofer weekly x 3  # CT A/P ASAP # follow up in 3 weeks- MD; labs- cbc/venofer-Dr.B

## 2020-02-15 ENCOUNTER — Other Ambulatory Visit: Payer: Self-pay

## 2020-02-15 ENCOUNTER — Ambulatory Visit
Admission: RE | Admit: 2020-02-15 | Discharge: 2020-02-15 | Disposition: A | Discharge status: Home / Self Care | Payer: Medicare Other | Source: Ambulatory Visit | Attending: Internal Medicine | Admitting: Internal Medicine

## 2020-02-15 ENCOUNTER — Telehealth: Payer: Self-pay | Admitting: *Deleted

## 2020-02-15 DIAGNOSIS — I700 Atherosclerosis of aorta: Secondary | ICD-10-CM | POA: Insufficient documentation

## 2020-02-15 DIAGNOSIS — D500 Iron deficiency anemia secondary to blood loss (chronic): Secondary | ICD-10-CM | POA: Diagnosis present

## 2020-02-15 DIAGNOSIS — R1900 Intra-abdominal and pelvic swelling, mass and lump, unspecified site: Secondary | ICD-10-CM | POA: Insufficient documentation

## 2020-02-15 DIAGNOSIS — E611 Iron deficiency: Secondary | ICD-10-CM | POA: Diagnosis present

## 2020-02-15 DIAGNOSIS — I2510 Atherosclerotic heart disease of native coronary artery without angina pectoris: Secondary | ICD-10-CM | POA: Insufficient documentation

## 2020-02-15 MED ORDER — IOHEXOL 300 MG/ML  SOLN
100.0000 mL | Freq: Once | INTRAMUSCULAR | Status: AC | PRN
  Administered 2020-02-15: 100 mL via INTRAVENOUS

## 2020-02-15 NOTE — Telephone Encounter (Signed)
IMPRESSION: 1. 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 Lymphoma and small-bowel adenocarcinoma are considered less likely. Mass is considered high risk for hemorrhage with respect to percutaneous biopsy. Consider PET in further evaluation, as clinically indicated. These results will be called to the ordering clinician or representative by the Radiologist Assistant, and communication documented in the PACS or [employer]. 2. Aortic atherosclerosis (ICD10-I70.0). Coronary artery calcification. 3. 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     Electronically Signed   By: Lorin Picket M.D.   On: 02/15/2020 15:09

## 2020-02-15 NOTE — Telephone Encounter (Signed)
Susan Barajas will call pt.  GB

## 2020-02-15 NOTE — Telephone Encounter (Signed)
Called report Large left Abdominal mass with large draining vein, Recommend PET for further evaluation. Report not available yet in [facility]

## 2020-02-16 ENCOUNTER — Inpatient Hospital Stay (HOSPITAL_BASED_OUTPATIENT_CLINIC_OR_DEPARTMENT_OTHER): Payer: Medicare Other | Admitting: Internal Medicine

## 2020-02-16 ENCOUNTER — Other Ambulatory Visit: Payer: Self-pay

## 2020-02-16 ENCOUNTER — Inpatient Hospital Stay: Payer: Medicare Other | Attending: Internal Medicine

## 2020-02-16 ENCOUNTER — Telehealth: Payer: Self-pay | Admitting: Internal Medicine

## 2020-02-16 ENCOUNTER — Encounter: Payer: Self-pay | Admitting: Internal Medicine

## 2020-02-16 VITALS — BP 107/52 | HR 67 | Temp 98.0°F | Resp 18 | Ht 64.0 in | Wt 191.0 lb

## 2020-02-16 VITALS — BP 104/62 | HR 59 | Temp 97.0°F | Resp 18

## 2020-02-16 DIAGNOSIS — I509 Heart failure, unspecified: Secondary | ICD-10-CM | POA: Insufficient documentation

## 2020-02-16 DIAGNOSIS — C49A3 Gastrointestinal stromal tumor of small intestine: Secondary | ICD-10-CM

## 2020-02-16 DIAGNOSIS — Z79899 Other long term (current) drug therapy: Secondary | ICD-10-CM | POA: Insufficient documentation

## 2020-02-16 DIAGNOSIS — E119 Type 2 diabetes mellitus without complications: Secondary | ICD-10-CM | POA: Diagnosis not present

## 2020-02-16 DIAGNOSIS — E669 Obesity, unspecified: Secondary | ICD-10-CM | POA: Diagnosis not present

## 2020-02-16 DIAGNOSIS — R5383 Other fatigue: Secondary | ICD-10-CM | POA: Insufficient documentation

## 2020-02-16 DIAGNOSIS — D509 Iron deficiency anemia, unspecified: Secondary | ICD-10-CM | POA: Diagnosis present

## 2020-02-16 DIAGNOSIS — R1902 Left upper quadrant abdominal swelling, mass and lump: Secondary | ICD-10-CM

## 2020-02-16 DIAGNOSIS — D372 Neoplasm of uncertain behavior of small intestine: Secondary | ICD-10-CM | POA: Diagnosis not present

## 2020-02-16 DIAGNOSIS — E611 Iron deficiency: Secondary | ICD-10-CM

## 2020-02-16 DIAGNOSIS — Z794 Long term (current) use of insulin: Secondary | ICD-10-CM | POA: Insufficient documentation

## 2020-02-16 DIAGNOSIS — I2510 Atherosclerotic heart disease of native coronary artery without angina pectoris: Secondary | ICD-10-CM | POA: Diagnosis not present

## 2020-02-16 DIAGNOSIS — E785 Hyperlipidemia, unspecified: Secondary | ICD-10-CM | POA: Insufficient documentation

## 2020-02-16 DIAGNOSIS — I10 Essential (primary) hypertension: Secondary | ICD-10-CM | POA: Diagnosis not present

## 2020-02-16 DIAGNOSIS — Z87891 Personal history of nicotine dependence: Secondary | ICD-10-CM | POA: Insufficient documentation

## 2020-02-16 DIAGNOSIS — J449 Chronic obstructive pulmonary disease, unspecified: Secondary | ICD-10-CM | POA: Diagnosis not present

## 2020-02-16 LAB — CBC WITH DIFFERENTIAL/PLATELET
Abs Immature Granulocytes: 0.03 10*3/uL (ref 0.00–0.07)
Basophils Absolute: 0 10*3/uL (ref 0.0–0.1)
Basophils Relative: 0 %
Eosinophils Absolute: 0 10*3/uL (ref 0.0–0.5)
Eosinophils Relative: 0 %
HCT: 29.6 % — ABNORMAL LOW (ref 36.0–46.0)
Hemoglobin: 8.7 g/dL — ABNORMAL LOW (ref 12.0–15.0)
Immature Granulocytes: 1 %
Lymphocytes Relative: 9 %
Lymphs Abs: 0.6 10*3/uL — ABNORMAL LOW (ref 0.7–4.0)
MCH: 27.5 pg (ref 26.0–34.0)
MCHC: 29.4 g/dL — ABNORMAL LOW (ref 30.0–36.0)
MCV: 93.7 fL (ref 80.0–100.0)
Monocytes Absolute: 0.5 10*3/uL (ref 0.1–1.0)
Monocytes Relative: 8 %
Neutro Abs: 5.1 10*3/uL (ref 1.7–7.7)
Neutrophils Relative %: 82 %
Platelets: 270 10*3/uL (ref 150–400)
RBC: 3.16 MIL/uL — ABNORMAL LOW (ref 3.87–5.11)
RDW: 17 % — ABNORMAL HIGH (ref 11.5–15.5)
WBC: 6.2 10*3/uL (ref 4.0–10.5)
nRBC: 0 % (ref 0.0–0.2)

## 2020-02-16 LAB — APTT: aPTT: 26 seconds (ref 24–36)

## 2020-02-16 LAB — PROTIME-INR
INR: 0.9 (ref 0.8–1.2)
Prothrombin Time: 12.2 seconds (ref 11.4–15.2)

## 2020-02-16 LAB — LACTATE DEHYDROGENASE: LDH: 123 U/L (ref 98–192)

## 2020-02-16 MED ORDER — SODIUM CHLORIDE 0.9 % IV SOLN
Freq: Once | INTRAVENOUS | Status: AC
  Filled 2020-02-16: qty 250

## 2020-02-16 MED ORDER — IRON SUCROSE 20 MG/ML IV SOLN
200.0000 mg | Freq: Once | INTRAVENOUS | Status: AC
  Administered 2020-02-16: 200 mg via INTRAVENOUS
  Filled 2020-02-16: qty 10

## 2020-02-16 NOTE — Progress Notes (Signed)
Pt tolerated infusion well. No s/s of distress or reaction noted. Pt and VS stable at discharge.

## 2020-02-16 NOTE — Progress Notes (Signed)
[facility] NOTE  Patient Care Team: Albina Billet, MD as PCP - General (Unknown Physician Specialty) Wellington Hampshire, MD as PCP - Cardiology (Cardiology) Christene Lye, MD (General Surgery) Albina Billet, MD (Internal Medicine) Schnier, Dolores Lory, MD (Vascular Surgery) Wilford Corner, MD as Consulting Physician (Gastroenterology) Cammie Sickle, MD as Consulting Physician (Hematology and Oncology)  CHIEF COMPLAINTS/PURPOSE OF CONSULTATION: Anemia   HEMATOLOGY HISTORY  # NOV 2021- CT A/P-10 x 10 left abdominal mass-  # CHRONIC ANEMIA EGD/capsule-WNL-2018 [Dr.vanga]-UGIB ; colonoscopy-2018 [Elliot] ; capsule-not done; hemoglobin around 8.8; iron saturation 5% [Dr.Tate].  Poor tolerance of p.o. iron; [[facility] July 2021]-high risk of anesthesia  # CHF/[Dr.Mclaheny]; COPD [Dr.Kasa]- on Home 4-6lit/ O2  HISTORY OF PRESENTING ILLNESS:  Susan Barajas 74 y.o.  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  Patient denies any worsening abdominal pain. Any nausea vomiting. Continues to have chronic shortness of breath even with minimal exertion. No blood in stools. Black stool because of iron pills.  Review of Systems  Constitutional: Positive for malaise/fatigue. Negative for chills, diaphoresis, fever and weight loss.  HENT: Negative for nosebleeds and sore throat.   Eyes: Negative for double vision.  Respiratory: Positive for shortness of breath. Negative for cough, hemoptysis, sputum production and wheezing.   Cardiovascular: Negative for chest pain, palpitations, orthopnea and leg swelling.  Gastrointestinal: Negative for abdominal pain, blood in stool, constipation, diarrhea, heartburn, melena, nausea and vomiting.  Genitourinary: Negative for dysuria, frequency and urgency.  Musculoskeletal: Negative for back pain and joint  pain.  Skin: Negative.  Negative for itching and rash.  Neurological: Negative for dizziness, tingling, focal weakness, weakness and headaches.  Endo/Heme/Allergies: Does not bruise/bleed easily.  Psychiatric/Behavioral: Negative for depression. The patient is not nervous/anxious and does not have insomnia.     MEDICAL HISTORY:  Past Medical History:  Diagnosis Date  . Asthma 1950  . Clotting disorder (HCC)    right leg  . 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 Glaucoma 1949  . Hyperlipidemia   . Hypertension   . Obesity, unspecified   . Personal history of tobacco use, presenting hazards to health   . Varicose veins of lower extremities with other complications     SURGICAL HISTORY: Past Surgical History:  Procedure Laterality Date  . BREAST BIOPSY Right 1991  . BREAST BIOPSY Left 2013  . COLONOSCOPY  2010   Dr. Vira Agar  . COLONOSCOPY  Jan 2016   Dr Vira Agar  . ESOPHAGOGASTRODUODENOSCOPY N/A 11/26/2016   Procedure: ESOPHAGOGASTRODUODENOSCOPY (EGD);  Surgeon: Lin Landsman, MD;  Location: [facility] ENDOSCOPY;  Service: Gastroenterology;  Laterality: N/A;  . GIVENS CAPSULE STUDY N/A 11/26/2016   Procedure: GIVENS CAPSULE STUDY, if EGD negative, plan to drop capsule with scope if EGD negative;  Surgeon: Lin Landsman, MD;  Location: [facility] ENDOSCOPY;  Service: Gastroenterology;  Laterality: N/A;  . IVC FILTER INSERTION N/A 11/14/2016   Procedure: IVC Filter Insertion;  Surgeon: Katha Cabal, MD;  Location: [facility] CV LAB;  Service: Cardiovascular;  Laterality: N/A;  . RIGHT/LEFT HEART CATH AND CORONARY ANGIOGRAPHY N/A 06/30/2019   Procedure: RIGHT/LEFT HEART CATH AND CORONARY ANGIOGRAPHY;  Surgeon: Larey Dresser, MD;  Location: [facility] CV LAB;  Service: Cardiovascular;  Laterality: N/A;  . VEIN SURGERY Right 2006   Vein  Closure Procedure; RF ablation of right GSV    SOCIAL HISTORY: Social History   Socioeconomic History  . Marital status:  Single    Spouse name: Not on file  . Number of children: 2  . Years of education: Not on file  . Highest education level: Not on file  Occupational History  . Occupation: CELL    Employer: RETIRED  Tobacco Use  . Smoking status: Former Smoker    Packs/day: 1.50    Years: 20.00    Pack years: 30.00    Quit date: 1999    Years since quitting: 22.8  . Smokeless tobacco: Never Used  Vaping Use  . Vaping Use: Never used  Substance and Sexual Activity  . Alcohol use: No    Alcohol/week: 0.0 standard drinks  . Drug use: No  . Sexual activity: Not Currently  Other Topics Concern  . Not on file  Social History Narrative   Independent at baseline. Lives at home with her brother; in [place]. Quit smoking 20 years; no alcohol.    Social Determinants of Health   Financial Resource Strain:   . Difficulty of Paying Living Expenses: Not on file  Food Insecurity:   . Worried About Charity fundraiser in the Last Year: Not on file  . Ran Out of Food in the Last Year: Not on file  Transportation Needs:   . Lack of Transportation (Medical): Not on file  . Lack of Transportation (Non-Medical): Not on file  Physical Activity:   . Days of Exercise per Week: Not on file  . Minutes of Exercise per Session: Not on file  Stress:   . Feeling of Stress : Not on file  Social Connections:   . Frequency of Communication with Friends and Family: Not on file  . Frequency of Social Gatherings with Friends and Family: Not on file  . Attends Religious Services: Not on file  . Active Member of Clubs or Organizations: Not on file  . Attends Archivist Meetings: Not on file  . Marital Status: Not on file  Intimate Partner Violence:   . Fear of Current or Ex-Partner: Not on file  . Emotionally Abused: Not on file  . Physically Abused: Not on file  . Sexually Abused: Not on file    FAMILY HISTORY: Family History  Problem Relation Age of Onset  . Prostate cancer Father   . Other  Sister        mouth cancer  . Hypertension Mother     ALLERGIES:  has No Known Allergies.  MEDICATIONS:  Current Outpatient Medications  Medication Sig Dispense Refill  . acetaminophen (TYLENOL) 325 MG tablet Take 2 tablets (650 mg total) by mouth every 6 (six) hours as needed for mild pain (or Fever >/= 101).    Marland Kitchen aspirin 81 MG chewable tablet Chew 1 tablet (81 mg total) by mouth daily. 30 tablet 0  . FARXIGA 10 MG TABS tablet TAKE 1 TABLET BY MOUTH ONCE DAILY BEFORE BREAKFAST 90 tablet 0  . ferrous gluconate (FERGON) 325 MG tablet Take 325 mg by mouth in the morning and at bedtime.     . furosemide (LASIX) 40 MG tablet Take 1 tablet (40 mg total) by mouth in the morning AND 0.5 tablets (20 mg total) every evening. 45 tablet 3  . HUMALOG KWIKPEN 100 UNIT/ML KiwkPen Inject 10 Units into the skin 2 (two) times daily.     . insulin glargine (LANTUS) 100 UNIT/ML injection Inject 32  Units into the skin daily.     Marland Kitchen latanoprost (XALATAN) 0.005 % ophthalmic solution Place 1 drop into both eyes at bedtime.     . lovastatin (MEVACOR) 20 MG tablet Take 20 mg by mouth every evening.     . macitentan (OPSUMIT) 10 MG tablet Take 1 tablet (10 mg total) by mouth daily. 30 tablet 11  . metFORMIN (GLUCOPHAGE) 1000 MG tablet Take 1,000 mg by mouth daily.     . Multiple Vitamin (MULTIVITAMIN) tablet Take 1 tablet by mouth daily.    Marland Kitchen omeprazole (PRILOSEC) 40 MG capsule Take 40 mg by mouth daily.    . potassium chloride SA (KLOR-CON) 20 MEQ tablet Take 1 tablet (20 mEq total) by mouth daily. 90 tablet 3  . tadalafil, PAH, (ALYQ) 20 MG tablet Take 2 tablets (40 mg total) by mouth daily. 60 tablet 11  . timolol (TIMOPTIC) 0.25 % ophthalmic solution Place 1 drop into both eyes 2 (two) times daily.     . Tiotropium Bromide-Olodaterol (STIOLTO RESPIMAT) 2.5-2.5 MCG/ACT AERS Inhale 2 puffs into the lungs daily. 1 g 5   No current facility-administered medications for this visit.   Facility-Administered  Medications Ordered in Other Visits  Medication Dose Route Frequency Provider Last Rate Last Admin  . 0.9 %  sodium chloride infusion   Intravenous Once Charlaine Dalton R, MD      . iron sucrose (VENOFER) injection 200 mg  200 mg Intravenous Once Charlaine Dalton R, MD          PHYSICAL EXAMINATION:   Vitals:   02/16/20 1100  BP: (!) 107/52  Pulse: 67  Resp: 18  Temp: 98 F (36.7 C)  SpO2: 93%   Filed Weights   02/16/20 1058  Weight: 191 lb (86.6 kg)    Physical Exam Constitutional:      Comments: 6 L of oxygen nasal cannula.  Walk independently. She is accompanied by her sister.  HENT:     Head: Normocephalic and atraumatic.     Mouth/Throat:     Pharynx: No oropharyngeal exudate.  Eyes:     Pupils: Pupils are equal, round, and reactive to light.  Cardiovascular:     Rate and Rhythm: Normal rate and regular rhythm.  Pulmonary:     Effort: No respiratory distress.     Breath sounds: No wheezing.     Comments: Decreased breath sounds. Abdominal:     General: Bowel sounds are normal. There is no distension.     Palpations: Abdomen is soft. There is no mass.     Tenderness: There is no abdominal tenderness. There is no guarding or rebound.  Musculoskeletal:        General: No tenderness. Normal range of motion.     Cervical back: Normal range of motion and neck supple.  Skin:    General: Skin is warm.  Neurological:     Mental Status: She is alert and oriented to person, place, and time.  Psychiatric:        Mood and Affect: Affect normal.     LABORATORY DATA:  I have reviewed the data as listed Lab Results  Component Value Date   WBC 5.4 02/10/2020   HGB 8.6 (L) 02/10/2020   HCT 29.5 (L) 02/10/2020   MCV 93.4 02/10/2020   PLT 224 02/10/2020   Recent Labs    08/15/19 1450 08/15/19 1450 08/23/19 1618 10/28/19 1213 02/10/20 1246  NA 136   < > 139 138 137  K 4.3   < >  4.3 4.2 4.0  CL 102   < > 103 107 103  CO2 26   < > _0 GLUCOSE  202*   < > 140* 103* 155*  BUN 15   < > 13 14 25*  CREATININE 0.95   < > 0.88 0.78 0.74  CALCIUM 8.6*   < > 9.2 8.6* 8.8*  GFRNONAA 59*   < > >60 >60 >60  GFRAA >60  --  >60 >60  --   PROT 6.8  --   --   --   --   ALBUMIN 3.7  --   --   --   --   AST 17  --   --   --   --   ALT 13  --   --   --   --   ALKPHOS 51  --   --   --   --   BILITOT 0.5  --   --   --   --    < > = values in this interval not displayed.     CT Abdomen Pelvis W Contrast  Result Date: 02/15/2020 CLINICAL DATA:  Iron deficiency anemia. EXAM: CT ABDOMEN AND PELVIS WITH CONTRAST TECHNIQUE: Multidetector CT imaging of the abdomen and pelvis was performed using the standard protocol following bolus administration of intravenous contrast. CONTRAST:  133m OMNIPAQUE IOHEXOL 300 MG/ML  SOLN COMPARISON:  None. FINDINGS: Lower chest: Minimal subsegmental volume loss in the left lower lobe. Heart is enlarged. Coronary artery calcification. No pericardial or pleural effusion. Distal esophagus is unremarkable. Hepatobiliary: Liver and gallbladder are unremarkable. No biliary ductal dilatation. Pancreas: Negative. Spleen: Negative. Adrenals/Urinary Tract: Adrenal glands and kidneys are unremarkable. Ureters are decompressed. Bladder is unremarkable. Stomach/Bowel: Stomach is unremarkable. Jejunum is intimately associated with a heterogeneous mass in the left abdomen, measuring 9.3 x 9.8 cm (2/34). Small bowel, appendix and colon are otherwise unremarkable. Vascular/Lymphatic: Atherosclerotic calcification of the aorta without aneurysm. IVC filter is in place. Abdominal peritoneal ligament and retroperitoneal lymph nodes are subcentimeter in short axis size. Small bowel mesenteric lymph nodes measure up to 1.6 cm (2/50). Reproductive: Multiple calcified fibroids in the uterus. No adnexal mass. Other: As partly discussed above, there is a large heterogeneous intraperitoneal mass in the left abdomen measuring 9.3 x 9.8 cm (2/34) with a large  dilated draining vein. Flecks of internal calcification are noted. Lesion is well-circumscribed and contacts multiple loops of small bowel. No free fluid. Mesenteries and peritoneum are otherwise unremarkable. Musculoskeletal: No worrisome lytic or sclerotic lesions. IMPRESSION: 1. 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 Lymphoma and small-bowel adenocarcinoma are considered less likely. Mass is considered high risk for hemorrhage with respect to percutaneous biopsy. Consider PET in further evaluation, as clinically indicated. These results will be called to the ordering clinician or representative by the Radiologist Assistant, and communication documented in the PACS or [employer] 2. Aortic atherosclerosis (ICD10-I70.0). Coronary artery calcification. 3. 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 Electronically Signed   By: MLorin PicketM.D.   On: 02/15/2020 15:09    Iron deficiency # Iron deficiency anemia- [may 2021]-on saturation 3%; Ferritin-5.  Suggestive of iron deficiency unclear etiology.  Poor tolerance to p.o. iron. S/P IV iron infusion.  Today hemoglobin is 8.5. Proceed with Venofer today.  # ETIOLOGY:  Likely secondary to the left abdominal mass which is closely related to small  bowel. He is on imaging findings highly suspicious for malignancy. The differential diagnosis includes-GIST tumor; angiosarcoma versus lymphomas. Given the high vascularity of the tumor-high risk for bleeding from biopsy. We will get a PET scan for further evaluation; plan biopsy planning. We will also get tumor markers today. Patient consents me to reach out to Dr. Bary Castilla.  #CHF/COPD home O2 4-6Lits;-borderline respiratory status-STABLE; patient understands that her respiratory status could be a potentially bear weight to biopsy option/treatment options.   # DISPOSITION:.   #  Venofer TODAY;  # labs- cbc;Pt;PTT; ca-19-9;CEA; LDH- please order #PET ASAP # keep appts as planned for next week- Dr.B  # I reviewed the blood work- with the patient in detail; also reviewed the imaging independently [as summarized above]; and with the patient in detail.   Dr.Tate   All questions were answered. The patient knows to call the clinic with any problems, questions or concerns.    Cammie Sickle, MD 02/16/2020 12:06 PM

## 2020-02-16 NOTE — Telephone Encounter (Signed)
On 11/03- spoke to pt re: results of the CT scans- abdominal mass. Pt asked to come for appt on 11/04 at 11 am to discuss the options.  Thanks, GB

## 2020-02-16 NOTE — Assessment & Plan Note (Addendum)
#  Iron deficiency anemia- [may 2021]-on saturation 3%; Ferritin-5.  Suggestive of iron deficiency unclear etiology.  Poor tolerance to p.o. iron. S/P IV iron infusion.  Today hemoglobin is 8.5. Proceed with Venofer today.  # ETIOLOGY:  Likely secondary to the left abdominal mass which is closely related to small bowel. He is on imaging findings highly suspicious for malignancy. The differential diagnosis includes-GIST tumor; angiosarcoma versus lymphomas. Given the high vascularity of the tumor-high risk for bleeding from biopsy. We will get a PET scan for further evaluation; plan biopsy planning. We will also get tumor markers today. Patient consents me to reach out to Dr. Bary Castilla.  #CHF/COPD home O2 4-6Lits;-borderline respiratory status-STABLE; patient understands that her respiratory status could be a potentially bear weight to biopsy option/treatment options.   # DISPOSITION:.   # Venofer TODAY;  # labs- cbc;Pt;PTT; ca-19-9;CEA; LDH- please order #PET ASAP # keep appts as planned for next week- Dr.B  # I reviewed the blood work- with the patient in detail; also reviewed the imaging independently [as summarized above]; and with the patient in detail.   Dr.Tate

## 2020-02-17 ENCOUNTER — Inpatient Hospital Stay: Payer: Medicare Other

## 2020-02-17 LAB — CANCER ANTIGEN 19-9: CA 19-9: 5 U/mL (ref 0–35)

## 2020-02-17 LAB — CEA: CEA: 1.5 ng/mL (ref 0.0–4.7)

## 2020-02-20 ENCOUNTER — Other Ambulatory Visit: Payer: Self-pay | Admitting: Internal Medicine

## 2020-02-21 ENCOUNTER — Telehealth: Payer: Self-pay

## 2020-02-21 NOTE — Telephone Encounter (Signed)
-----  Message from Yevette Edwards, RN sent at 02/07/2020  1:09 PM EDT ----- Regarding: Labs Repeat CBC, order in epic

## 2020-02-21 NOTE — Telephone Encounter (Signed)
Spoke with patient in regards to lab results and Dr. Havery Moros recommendations. Pt is aware that we will repeat labs in 2 weeks, pt states that she receives another iron infusion this Friday. Patient verbalized understanding and had no other concerns at the end of the call.   Lab order and reminder in epic.

## 2020-02-21 NOTE — Telephone Encounter (Signed)
Dr. Havery Moros, patient is due for repeat CBC, she recently had labs on 02/16/20, pt states that she also received iron infusion on this day as well. Please advise, thank you

## 2020-02-21 NOTE — Telephone Encounter (Signed)
Ok, Hgb is stable. Let's repeat CBC in 2 weeks and see how she responds to another dose of IV iron. Thanks

## 2020-02-22 ENCOUNTER — Ambulatory Visit
Admission: RE | Admit: 2020-02-22 | Discharge: 2020-02-22 | Disposition: A | Discharge status: Home / Self Care | Payer: Medicare Other | Source: Ambulatory Visit | Attending: Internal Medicine | Admitting: Internal Medicine

## 2020-02-22 ENCOUNTER — Other Ambulatory Visit: Payer: Self-pay

## 2020-02-22 DIAGNOSIS — R1902 Left upper quadrant abdominal swelling, mass and lump: Secondary | ICD-10-CM

## 2020-02-22 DIAGNOSIS — M5137 Other intervertebral disc degeneration, lumbosacral region: Secondary | ICD-10-CM | POA: Diagnosis not present

## 2020-02-22 DIAGNOSIS — J439 Emphysema, unspecified: Secondary | ICD-10-CM | POA: Insufficient documentation

## 2020-02-22 DIAGNOSIS — I517 Cardiomegaly: Secondary | ICD-10-CM | POA: Diagnosis not present

## 2020-02-22 DIAGNOSIS — D259 Leiomyoma of uterus, unspecified: Secondary | ICD-10-CM | POA: Diagnosis not present

## 2020-02-22 DIAGNOSIS — I2510 Atherosclerotic heart disease of native coronary artery without angina pectoris: Secondary | ICD-10-CM | POA: Diagnosis not present

## 2020-02-22 DIAGNOSIS — C49A3 Gastrointestinal stromal tumor of small intestine: Secondary | ICD-10-CM | POA: Diagnosis not present

## 2020-02-22 DIAGNOSIS — K5730 Diverticulosis of large intestine without perforation or abscess without bleeding: Secondary | ICD-10-CM | POA: Insufficient documentation

## 2020-02-22 LAB — GLUCOSE, CAPILLARY: Glucose-Capillary: 94 mg/dL (ref 70–99)

## 2020-02-22 IMAGING — PT NM PET TUM IMG INITIAL (PI) SKULL BASE T - THIGH
1 series · 5 of 5 positions shown · non-contrast
Comparison: 02/15/2020 CT scan

CLINICAL DATA: Initial treatment strategy for GI stromal tumor of
the small bowel.

EXAM:
NUCLEAR MEDICINE PET SKULL BASE TO THIGH
TECHNIQUE: 9.7 mCi F-18 FDG was injected intravenously. Full-ring PET imaging
was performed from the skull base to thigh after the radiotracer. CT
data was obtained and used for attenuation correction and anatomic
localization.
Fasting blood glucose: 94 mg/dl

[Series 1113: results mm oncology reading · 5.0mm · 0.75mm/px · 5 of 5 slices shown]
[im 1/5]
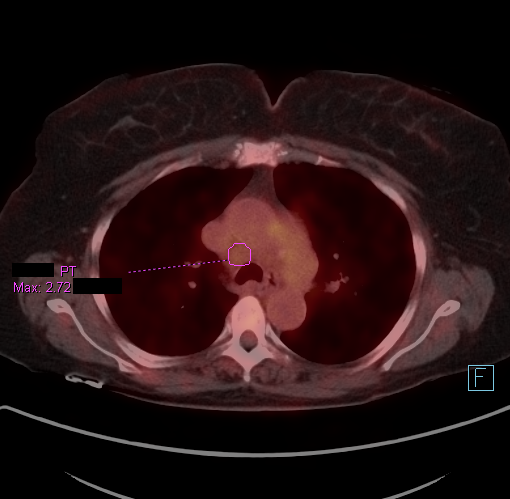
[im 2/5]
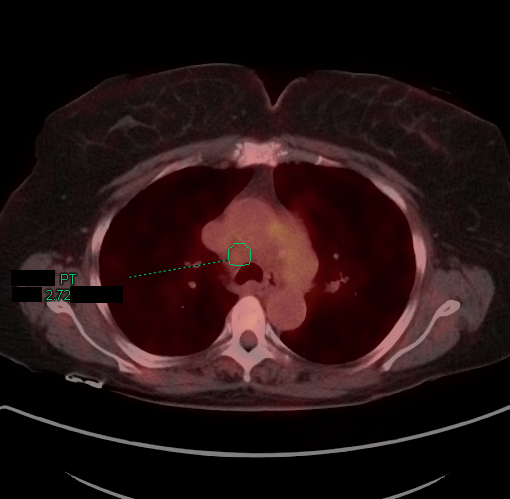
[im 3/5]
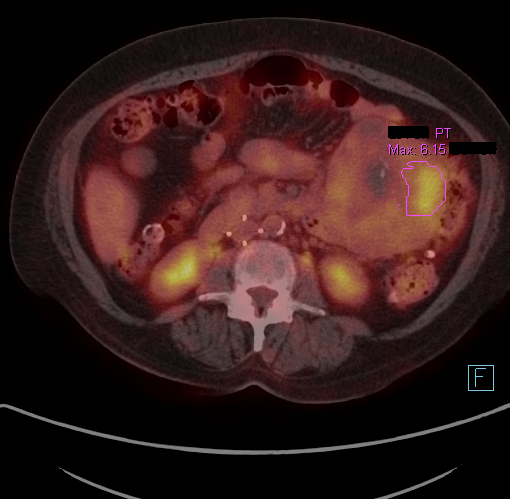
[im 4/5]
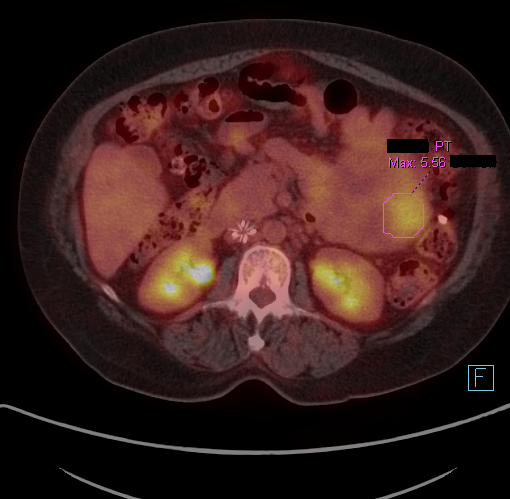
[im 5/5]
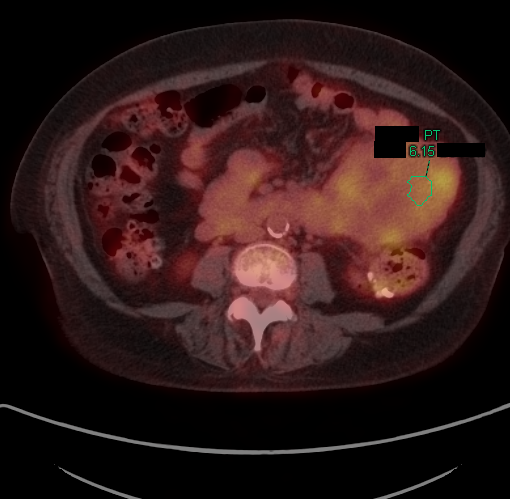

[5 of 5 positions shown; findings below may reference images not displayed]

FINDINGS: Mediastinal blood pool activity: SUV max

Liver activity: SUV max NA

NECK: No significant abnormal hypermetabolic activity in this
region.

Incidental CT findings: none

CHEST: 1.0 cm right lower paratracheal lymph node on image 76 of
series 3 is not appreciably hypermetabolic, maximum SUV 2.7 is less
than blood pool. Similarly there is likely an AP window lymph node
causing the slight convexity of the AP window, but no hypermetabolic
activity to suggest malignant involvement.

Incidental CT findings: Coronary, aortic arch, and branch vessel
atherosclerotic vascular disease. Mild cardiomegaly. Centrilobular
emphysema. Mild left lower lobe scarring along the hemidiaphragm.

ABDOMEN/PELVIS: The 10.2 cm left upper quadrant mass has maximum SUV
of 6.2, and heterogeneous internal activity with some suspected
central necrosis. This mass is immediately adjacent to loops of
jejunum and also proximate to the distal transverse colon.

Incidental CT findings: Scattered colonic diverticulosis. Aortoiliac
atherosclerotic vascular disease. Infrarenal IVC filter. Sigmoid
colon diverticulosis. Calcified uterine fibroids.

Small muscular lipomas are noted involving the left vastus
intermedius and right hip adductor musculature.

SKELETON: No significant abnormal hypermetabolic activity in this
region.

Incidental CT findings: Degenerative endplate sclerosis and
degenerative loss of intervertebral disc height at L5-S1.
IMPRESSION: 1. The dominant 10.2 cm left upper quadrant mass has a maximum SUV
of 6.2 and notable internal heterogeneity, appearance compatible
with tissue diagnosis of GI stromal tumor.
2. No findings of distant metastatic spread are identified.
3. Borderline sized paratracheal and AP window lymph nodes are not
hypermetabolic.
4. Other imaging findings of potential clinical significance: Aortic
Atherosclerosis (XQJE1-O9P.P). Coronary atherosclerosis. Mild
cardiomegaly. Emphysema (XQJE1-[id number].G). Mild scarring in the left
lower lobe. Scattered colonic diverticulosis with more concentrated
diverticulosis in the sigmoid colon. Infrarenal IVC filter.
Calcified uterine fibroids. Degenerative disc disease at L5-S1.

## 2020-02-22 MED ORDER — FLUDEOXYGLUCOSE F - 18 (FDG) INJECTION
9.4000 | Freq: Once | INTRAVENOUS | Status: AC | PRN
  Administered 2020-02-22: 9.66 via INTRAVENOUS

## 2020-02-23 ENCOUNTER — Other Ambulatory Visit: Payer: Medicare Other

## 2020-02-24 ENCOUNTER — Other Ambulatory Visit: Payer: Self-pay

## 2020-02-24 ENCOUNTER — Encounter: Payer: Self-pay | Admitting: Internal Medicine

## 2020-02-24 ENCOUNTER — Telehealth: Payer: Self-pay | Admitting: Internal Medicine

## 2020-02-24 ENCOUNTER — Inpatient Hospital Stay (HOSPITAL_BASED_OUTPATIENT_CLINIC_OR_DEPARTMENT_OTHER): Payer: Medicare Other | Admitting: Internal Medicine

## 2020-02-24 ENCOUNTER — Inpatient Hospital Stay: Payer: Medicare Other

## 2020-02-24 VITALS — BP 113/61 | HR 65 | Resp 20

## 2020-02-24 DIAGNOSIS — E611 Iron deficiency: Secondary | ICD-10-CM

## 2020-02-24 DIAGNOSIS — D509 Iron deficiency anemia, unspecified: Secondary | ICD-10-CM | POA: Diagnosis not present

## 2020-02-24 DIAGNOSIS — R1904 Left lower quadrant abdominal swelling, mass and lump: Secondary | ICD-10-CM

## 2020-02-24 MED ORDER — SODIUM CHLORIDE 0.9 % IV SOLN
Freq: Once | INTRAVENOUS | Status: AC
  Filled 2020-02-24: qty 250

## 2020-02-24 MED ORDER — IRON SUCROSE 20 MG/ML IV SOLN
200.0000 mg | Freq: Once | INTRAVENOUS | Status: AC
  Administered 2020-02-24: 200 mg via INTRAVENOUS
  Filled 2020-02-24: qty 10

## 2020-02-24 NOTE — Progress Notes (Signed)
Tumor Board Documentation  Susan Barajas was presented by Dr Rogue Bussing at our Tumor Board on 02/23/2020, which included representatives from medical oncology, radiation oncology, internal medicine, navigation, pathology, radiology, surgical, pharmacy, genetics, research, palliative care, pulmonology.  Susan currently presents as a current patient, for discussion with history of the following treatments: active survellience.  Additionally, we reviewed previous medical and familial history, history of present illness, and recent lab results along with all available histopathologic and imaging studies. The tumor board considered available treatment options and made the following recommendations: Biopsy (CT Guided Biopsy Dr Pascal Lux)    The following procedures/referrals were also placed: No orders of the defined types were placed in this encounter.   Clinical Trial Status: not discussed   Staging used: Not Applicable  National site-specific guidelines   were discussed with respect to the case.  Tumor board is a meeting of clinicians from various specialty areas who evaluate and discuss patients for whom a multidisciplinary approach is being considered. Final determinations in the plan of care are those of the provider(s). The responsibility for follow up of recommendations given during tumor board is that of the provider.   Today's extended care, comprehensive team conference, Susan was not present for the discussion and was not examined.   Multidisciplinary Tumor Board is a multidisciplinary case peer review process.  Decisions discussed in the Multidisciplinary Tumor Board reflect the opinions of the specialists present at the conference without having examined the patient.  Ultimately, treatment and diagnostic decisions rest with the primary provider(s) and the patient.

## 2020-02-24 NOTE — Progress Notes (Signed)
[place] NOTE  Patient Care Team: Albina Billet, MD as PCP - General (Unknown Physician Specialty) Wellington Hampshire, MD as PCP - Cardiology (Cardiology) Christene Lye, MD (General Surgery) Albina Billet, MD (Internal Medicine) Schnier, Dolores Lory, MD (Vascular Surgery) Wilford Corner, MD as Consulting Physician (Gastroenterology) Cammie Sickle, MD as Consulting Physician (Hematology and Oncology)  CHIEF COMPLAINTS/PURPOSE OF CONSULTATION: Anemia   HEMATOLOGY HISTORY  # NOV 2021- CT A/P-10 x 10 left abdominal mass-  # CHRONIC ANEMIA EGD/capsule-WNL-2018 [Dr.vanga]-UGIB ; colonoscopy-2018 [Elliot] ; capsule-not done; hemoglobin around 8.8; iron saturation 5% [Dr.Tate].  Poor tolerance of p.o. iron; [[facility] July 2021]-high risk of anesthesia  # CHF/[Dr.Mclaheny]; COPD [Dr.Kasa]- on Home 4-6lit/ O2  HISTORY OF PRESENTING ILLNESS:  Susan Barajas 74 y.o.  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  The PET scan was ordered because of concerns of malignancy as noted on CT scan/left upper quadrant mass.   Patient denies any blood in stools or black or stools.  Denies any worsening abdominal pain.  She continues to have chronic shortness of breath with minimal exertion.  Patient is currently getting IV iron infusion.  Review of Systems  Constitutional: Positive for malaise/fatigue. Negative for chills, diaphoresis, fever and weight loss.  HENT: Negative for nosebleeds and sore throat.   Eyes: Negative for double vision.  Respiratory: Positive for shortness of breath. Negative for cough, hemoptysis, sputum production and wheezing.   Cardiovascular: Negative for chest pain, palpitations, orthopnea and leg swelling.  Gastrointestinal: Negative for abdominal pain, blood in stool, constipation, diarrhea, heartburn, melena, nausea  and vomiting.  Genitourinary: Negative for dysuria, frequency and urgency.  Musculoskeletal: Negative for back pain and joint pain.  Skin: Negative.  Negative for itching and rash.  Neurological: Negative for dizziness, tingling, focal weakness, weakness and headaches.  Endo/Heme/Allergies: Does not bruise/bleed easily.  Psychiatric/Behavioral: Negative for depression. The patient is not nervous/anxious and does not have insomnia.     MEDICAL HISTORY:  Past Medical History:  Diagnosis Date  . Asthma 1950  . Clotting disorder (HCC)    right leg  . 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 Glaucoma 1949  . Hyperlipidemia   . Hypertension   . Obesity, unspecified   . Personal history of tobacco use, presenting hazards to health   . Varicose veins of lower extremities with other complications     SURGICAL HISTORY: Past Surgical History:  Procedure Laterality Date  . BREAST BIOPSY Right 1991  . BREAST BIOPSY Left 2013  . COLONOSCOPY  2010   Dr. Vira Agar  . COLONOSCOPY  Jan 2016   Dr Vira Agar  . ESOPHAGOGASTRODUODENOSCOPY N/A 11/26/2016   Procedure: ESOPHAGOGASTRODUODENOSCOPY (EGD);  Surgeon: Lin Landsman, MD;  Location: [facility] ENDOSCOPY;  Service: Gastroenterology;  Laterality: N/A;  . GIVENS CAPSULE STUDY N/A 11/26/2016   Procedure: GIVENS CAPSULE STUDY, if EGD negative, plan to drop capsule with scope if EGD negative;  Surgeon: Lin Landsman, MD;  Location: [facility] ENDOSCOPY;  Service: Gastroenterology;  Laterality: N/A;  . IVC FILTER INSERTION N/A 11/14/2016   Procedure: IVC Filter Insertion;  Surgeon: Katha Cabal, MD;  Location: [place];  Service: Cardiovascular;  Laterality: N/A;  . RIGHT/LEFT HEART CATH AND CORONARY ANGIOGRAPHY N/A 06/30/2019   Procedure: RIGHT/LEFT HEART CATH AND CORONARY ANGIOGRAPHY;  Surgeon: Larey Dresser, MD;  Location: [facility];  Service: Cardiovascular;  Laterality: N/A;  . VEIN SURGERY Right 2006   Vein  Closure Procedure; RF ablation of right GSV    SOCIAL HISTORY: Social History   Socioeconomic History  . Marital status: Single    Spouse name: Not on file  . Number of children: 2  . Years of education: Not on file  . Highest education level: Not on file  Occupational History  . Occupation: CELL    Employer: RETIRED  Tobacco Use  . Smoking status: Former Smoker    Packs/day: 1.50    Years: 20.00    Pack years: 30.00    Quit date: 1999    Years since quitting: 22.8  . Smokeless tobacco: Never Used  Vaping Use  . Vaping Use: Never used  Substance and Sexual Activity  . Alcohol use: No    Alcohol/week: 0.0 standard drinks  . Drug use: No  . Sexual activity: Not Currently  Other Topics Concern  . Not on file  Social History Narrative   Independent at baseline. Lives at home with her brother; in [place]. Quit smoking 20 years; no alcohol.    Social Determinants of Health   Financial Resource Strain:   . Difficulty of Paying Living Expenses: Not on file  Food Insecurity:   . Worried About Charity fundraiser in the Last Year: Not on file  . Ran Out of Food in the Last Year: Not on file  Transportation Needs:   . Lack of Transportation (Medical): Not on file  . Lack of Transportation (Non-Medical): Not on file  Physical Activity:   . Days of Exercise per Week: Not on file  . Minutes of Exercise per Session: Not on file  Stress:   . Feeling of Stress : Not on file  Social Connections:   . Frequency of Communication with Friends and Family: Not on file  . Frequency of Social Gatherings with Friends and Family: Not on file  . Attends Religious Services: Not on file  . Active Member of Clubs or Organizations: Not on file  . Attends Archivist Meetings: Not on file  . Marital Status: Not on file  Intimate Partner Violence:   . Fear of Current or Ex-Partner: Not on file  . Emotionally Abused: Not on file  . Physically Abused: Not on file  . Sexually  Abused: Not on file    FAMILY HISTORY: Family History  Problem Relation Age of Onset  . Prostate cancer Father   . Other Sister        mouth cancer  . Hypertension Mother     ALLERGIES:  has No Known Allergies.  MEDICATIONS:  Current Outpatient Medications  Medication Sig Dispense Refill  . acetaminophen (TYLENOL) 325 MG tablet Take 2 tablets (650 mg total) by mouth every 6 (six) hours as needed for mild pain (or Fever >/= 101).    Marland Kitchen aspirin 81 MG chewable tablet Chew 1 tablet (81 mg total) by mouth daily. 30 tablet 0  . FARXIGA 10 MG TABS tablet TAKE 1 TABLET BY MOUTH ONCE DAILY BEFORE BREAKFAST 90 tablet 0  . ferrous gluconate (FERGON) 325 MG tablet Take 325 mg by mouth in the morning and at bedtime.     . furosemide (LASIX) 40 MG tablet Take 1 tablet (40 mg total) by mouth in the morning AND 0.5 tablets (20 mg total) every evening. 45 tablet 3  . HUMALOG [facility]  100 UNIT/ML KiwkPen Inject 10 Units into the skin 2 (two) times daily.     . insulin glargine (LANTUS) 100 UNIT/ML injection Inject 32 Units into the skin daily.     Marland Kitchen latanoprost (XALATAN) 0.005 % ophthalmic solution Place 1 drop into both eyes at bedtime.     . lovastatin (MEVACOR) 20 MG tablet Take 20 mg by mouth every evening.     . macitentan (OPSUMIT) 10 MG tablet Take 1 tablet (10 mg total) by mouth daily. 30 tablet 11  . metFORMIN (GLUCOPHAGE) 1000 MG tablet Take 1,000 mg by mouth daily.     . Multiple Vitamin (MULTIVITAMIN) tablet Take 1 tablet by mouth daily.    Marland Kitchen omeprazole (PRILOSEC) 40 MG capsule Take 40 mg by mouth daily.    . potassium chloride SA (KLOR-CON) 20 MEQ tablet Take 1 tablet (20 mEq total) by mouth daily. 90 tablet 3  . tadalafil, PAH, (ALYQ) 20 MG tablet Take 2 tablets (40 mg total) by mouth daily. 60 tablet 11  . timolol (TIMOPTIC) 0.25 % ophthalmic solution Place 1 drop into both eyes 2 (two) times daily.     . Tiotropium Bromide-Olodaterol (STIOLTO RESPIMAT) 2.5-2.5 MCG/ACT AERS Inhale 2 puffs  into the lungs daily. 1 g 5   No current facility-administered medications for this visit.      PHYSICAL EXAMINATION:   Vitals:   02/24/20 1304  BP: (!) 113/58  Pulse: 75  Resp: 16  Temp: 99.2 F (37.3 C)  SpO2: 93%   Filed Weights   02/24/20 1304  Weight: 190 lb (86.2 kg)    Physical Exam Constitutional:      Comments: 6 L of oxygen nasal cannula.  Walk independently. She is alone.  HENT:     Head: Normocephalic and atraumatic.     Mouth/Throat:     Pharynx: No oropharyngeal exudate.  Eyes:     Pupils: Pupils are equal, round, and reactive to light.  Cardiovascular:     Rate and Rhythm: Normal rate and regular rhythm.  Pulmonary:     Effort: No respiratory distress.     Breath sounds: No wheezing.     Comments: Decreased breath sounds. Abdominal:     General: Bowel sounds are normal. There is no distension.     Palpations: Abdomen is soft. There is no mass.     Tenderness: There is no abdominal tenderness. There is no guarding or rebound.  Musculoskeletal:        General: No tenderness. Normal range of motion.     Cervical back: Normal range of motion and neck supple.  Skin:    General: Skin is warm.  Neurological:     Mental Status: She is alert and oriented to person, place, and time.  Psychiatric:        Mood and Affect: Affect normal.     LABORATORY DATA:  I have reviewed the data as listed Lab Results  Component Value Date   WBC 6.2 02/16/2020   HGB 8.7 (L) 02/16/2020   HCT 29.6 (L) 02/16/2020   MCV 93.7 02/16/2020   PLT 270 02/16/2020   Recent Labs    08/15/19 1450 08/15/19 1450 08/23/19 1618 10/28/19 1213 02/10/20 1246  NA 136   < > 139 138 137  K 4.3   < > 4.3 4.2 4.0  CL 102   < > 103 107 103  CO2 26   < > _0 GLUCOSE 202*   < > 140* 103* 155*  BUN 15   < > 13 14 25*  CREATININE 0.95   < > 0.88 0.78 0.74  CALCIUM 8.6*   < > 9.2 8.6* 8.8*  GFRNONAA 59*   < > >60 >60 >60  GFRAA >60  --  >60 >60  --   PROT 6.8  --   --    --   --   ALBUMIN 3.7  --   --   --   --   AST 17  --   --   --   --   ALT 13  --   --   --   --   ALKPHOS 51  --   --   --   --   BILITOT 0.5  --   --   --   --    < > = values in this interval not displayed.     CT Abdomen Pelvis W Contrast  Result Date: 02/15/2020 CLINICAL DATA:  Iron deficiency anemia. EXAM: CT ABDOMEN AND PELVIS WITH CONTRAST TECHNIQUE: Multidetector CT imaging of the abdomen and pelvis was performed using the standard protocol following bolus administration of intravenous contrast. CONTRAST:  126m OMNIPAQUE IOHEXOL 300 MG/ML  SOLN COMPARISON:  None. FINDINGS: Lower chest: Minimal subsegmental volume loss in the left lower lobe. Heart is enlarged. Coronary artery calcification. No pericardial or pleural effusion. Distal esophagus is unremarkable. Hepatobiliary: Liver and gallbladder are unremarkable. No biliary ductal dilatation. Pancreas: Negative. Spleen: Negative. Adrenals/Urinary Tract: Adrenal glands and kidneys are unremarkable. Ureters are decompressed. Bladder is unremarkable. Stomach/Bowel: Stomach is unremarkable. Jejunum is intimately associated with a heterogeneous mass in the left abdomen, measuring 9.3 x 9.8 cm (2/34). Small bowel, appendix and colon are otherwise unremarkable. Vascular/Lymphatic: Atherosclerotic calcification of the aorta without aneurysm. IVC filter is in place. Abdominal peritoneal ligament and retroperitoneal lymph nodes are subcentimeter in short axis size. Small bowel mesenteric lymph nodes measure up to 1.6 cm (2/50). Reproductive: Multiple calcified fibroids in the uterus. No adnexal mass. Other: As partly discussed above, there is a large heterogeneous intraperitoneal mass in the left abdomen measuring 9.3 x 9.8 cm (2/34) with a large dilated draining vein. Flecks of internal calcification are noted. Lesion is well-circumscribed and contacts multiple loops of small bowel. No free fluid. Mesenteries and peritoneum are otherwise unremarkable.  Musculoskeletal: No worrisome lytic or sclerotic lesions. IMPRESSION: 1. 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 Lymphoma and small-bowel adenocarcinoma are considered less likely. Mass is considered high risk for hemorrhage with respect to percutaneous biopsy. Consider PET in further evaluation, as clinically indicated. These results will be called to the ordering clinician or representative by the Radiologist Assistant, and communication documented in the PACS or [employer] 2. Aortic atherosclerosis (ICD10-I70.0). Coronary artery calcification. 3. 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 Electronically Signed   By: MLorin PicketM.D.   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 EXAM: NUCLEAR MEDICINE PET SKULL BASE TO THIGH TECHNIQUE: 9.7 mCi F-18 FDG was injected intravenously. Full-ring PET imaging was performed from the skull base to thigh after the radiotracer. CT data was obtained and used for attenuation correction and anatomic localization. Fasting blood glucose: 94 mg/dl COMPARISON:  02/15/2020 CT scan FINDINGS: Mediastinal blood pool activity: SUV max 2.9 Liver activity:  SUV max NA NECK: No significant abnormal hypermetabolic activity in this region. Incidental CT findings: none CHEST: 1.0 cm right lower paratracheal lymph node on image 76 of series 3 is not appreciably hypermetabolic, maximum SUV 2.7 is less than blood pool. Similarly there is likely an AP window lymph node causing the slight convexity of the AP window, but no hypermetabolic activity to suggest malignant involvement. Incidental CT findings: Coronary, aortic arch, and branch vessel atherosclerotic vascular disease. Mild cardiomegaly. Centrilobular emphysema. Mild  left lower lobe scarring along the hemidiaphragm. ABDOMEN/PELVIS: The 10.2 cm left upper quadrant mass has maximum SUV of 6.2, and heterogeneous internal activity with some suspected central necrosis. This mass is immediately adjacent to loops of jejunum and also proximate to the distal transverse colon. Incidental CT findings: Scattered colonic diverticulosis. Aortoiliac atherosclerotic vascular disease. Infrarenal IVC filter. Sigmoid colon diverticulosis. Calcified uterine fibroids. Small muscular lipomas are noted involving the left vastus intermedius and right hip adductor musculature. SKELETON: No significant abnormal hypermetabolic activity in this region. Incidental CT findings: Degenerative endplate sclerosis and degenerative loss of intervertebral disc height at L5-S1. IMPRESSION: 1. 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 2. No findings of distant metastatic spread are identified. 3. Borderline sized paratracheal and AP window lymph nodes are not hypermetabolic. 4. Other imaging findings of potential clinical significance: Aortic Atherosclerosis (ICD10-I70.0). Coronary atherosclerosis. Mild cardiomegaly. Emphysema (ICD10-J43.9). Mild scarring in the left lower lobe. Scattered colonic diverticulosis with more concentrated diverticulosis in the sigmoid colon. Infrarenal IVC filter. Calcified uterine fibroids. Degenerative disc disease at L5-S1. Electronically Signed   By: Van Clines M.D.   On: 02/22/2020 16:26    Iron deficiency # Iron deficiency anemia- [may 2021]-on saturation 3%; Ferritin-5.  Suggestive of iron deficiency unclear etiology.  Poor tolerance to p.o. iron. S/P IV iron infusion.  11/04- hemoglobin is 8.4. Proceed with Venofer today.  # ETIOLOGY:  Likely secondary to the left abdominal mass which is closely related to small bowel; PET scan NOV 9th  [id number] metabolic mass noted-highly concerning for malignancy-differential includes GIST; angiosarcoma; lymphoma.  Reviewed the tumor conference on 11/11-recommend CT-guided biopsy.  Discussed with the patient.  Patient agreement.  Ordered CT-guided biopsy. Pt on Asprin 81 mg/day.  Discussed with patient that treatment options to be based upon the results of the biopsy.  However, aggressive treatment options unlikely to be offered given borderline performance status/respiratory status.  #CHF/COPD home O2 4-6Lits;-borderline respiratory status-STABLE; patient understands that her respiratory status could be a potentially bear weight to biopsy option/treatment options.  See above.  # DISPOSITION:.   # Ct Biospy as ordered # Venofer TODAY;  # keep appts for infusion as planned next week # Follow up with MD- TBD-Dr.B  # I reviewed the blood work- with the patient in detail; also reviewed the imaging independently [as summarized above]; and with the patient in detail.    Dr.Tate   All questions were answered. The patient knows to call the clinic with any problems, questions or concerns.    Cammie Sickle, MD 02/24/2020 4:28 PM

## 2020-02-24 NOTE — Telephone Encounter (Signed)
On 11/11-discuss at tumor conference; recommend CT-guided biopsy of the abdominal lesion.  Discussed with patient the recommendations of the tumor conference.  C-please schedule MD appointment today patient at 1:00 today [11/12; OK to double book.Patient coming for IV iron infusion today at 1:30.  Please inform/confirm with pt.  Cancel MD appointment for 11/19.

## 2020-02-24 NOTE — Progress Notes (Signed)
1433- Patient tolerated Venofer infusion well. Patient and vital signs stable. Patient discharged to home at this time.

## 2020-02-24 NOTE — Assessment & Plan Note (Addendum)
#  Iron deficiency anemia- [may 2021]-on saturation 3%; Ferritin-5.  Suggestive of iron deficiency unclear etiology.  Poor tolerance to p.o. iron. S/P IV iron infusion.  11/04- hemoglobin is 8.4. Proceed with Venofer today.  # ETIOLOGY:  Likely secondary to the left abdominal mass which is closely related to small bowel; PET scan NOV 9th [id number] metabolic mass noted-highly concerning for malignancy-differential includes GIST; angiosarcoma; lymphoma.  Reviewed the tumor conference on 11/11-recommend CT-guided biopsy.  Discussed with the patient.  Patient agreement.  Ordered CT-guided biopsy. Pt on Asprin 81 mg/day.  Discussed with patient that treatment options to be based upon the results of the biopsy.  However, aggressive treatment options unlikely to be offered given borderline performance status/respiratory status.  #CHF/COPD home O2 4-6Lits;-borderline respiratory status-STABLE; patient understands that her respiratory status could be a potentially bear weight to biopsy option/treatment options.  See above.  # DISPOSITION:.   # Ct Biospy as ordered # Venofer TODAY;  # keep appts for infusion as planned next week # Follow up with MD- TBD-Dr.B  # I reviewed the blood work- with the patient in detail; also reviewed the imaging independently [as summarized above]; and with the patient in detail.    Dr.Tate

## 2020-02-28 ENCOUNTER — Other Ambulatory Visit (HOSPITAL_COMMUNITY): Payer: Self-pay | Admitting: Physician Assistant

## 2020-02-28 ENCOUNTER — Other Ambulatory Visit: Payer: Self-pay

## 2020-02-28 ENCOUNTER — Encounter: Payer: Self-pay | Admitting: Gastroenterology

## 2020-02-28 ENCOUNTER — Ambulatory Visit (INDEPENDENT_AMBULATORY_CARE_PROVIDER_SITE_OTHER): Payer: Medicare Other | Admitting: Gastroenterology

## 2020-02-28 ENCOUNTER — Other Ambulatory Visit (INDEPENDENT_AMBULATORY_CARE_PROVIDER_SITE_OTHER): Payer: Medicare Other

## 2020-02-28 VITALS — BP 121/58 | HR 75 | Ht 65.0 in | Wt 191.0 lb

## 2020-02-28 DIAGNOSIS — R948 Abnormal results of function studies of other organs and systems: Secondary | ICD-10-CM | POA: Diagnosis not present

## 2020-02-28 DIAGNOSIS — D509 Iron deficiency anemia, unspecified: Secondary | ICD-10-CM

## 2020-02-28 LAB — HEMOGLOBIN: Hemoglobin: 8 g/dL — CL (ref 12.0–15.0)

## 2020-02-28 NOTE — Progress Notes (Signed)
HPI :  74 year old female here for a follow-up visit for iron deficiency anemia.  She has a history of chronic hypoxic respiratory failure on chronic supplemental oxygen, chronic diastolic heart failure, OSA.  See prior notes for details of her history of this.  She has had a hemoglobin ranging from 8-10 with low iron levels in the past. Patient has had a prior workup in the past including colonoscopy in 2016 which reportedly showed diverticulosis and hemorrhoids (no report available), as well as an EGD in August 2018 for melena / dark stools / worsening anemia which showed no overt cause of bleeding but remarkable for H pylori gastritis. She had a subsequent capsule endoscopy at that time which was read as normal. That is the last time she has had an evaluation of her bowel that she is aware of. She does not take a blood thinner, only 81 mg of aspirin per day. She denies the use of other NSAIDs.  She has a significant cardiac history and pulmonary hypertension. Echo in 1/21 showed normal LV EF with PASP 80 and severely dilated RV with mild RV dysfunction. RHC/LHC was done in 3/21, showing mild CAD;  PA 68/25 mean 42, mean PCWP 9. V/Q scan in 3/21 did not show evidence for chronic PE. High resolution chest CT in 3/21 did not showevidence for ILD, it did show mild-moderate emphysema. PFTs (3/21) showed moderate-severe COPD. She is on 4-6 L [place] at baseline.  She continues to have short of breath with exertion. On BiPAP at night.  At her last visit I spoke with her cardiologist, she was thought to be high risk for anesthesia and we decided to monitor her on IV iron initially.  Her hemoglobin rose from 8-10's and she was doing well.  More recently it has down trended back down to the eights.  Since I have last seen her she had a CT scan of the abdomen pelvis with contrast and a subsequent PET scan done on November 3 and November 10.  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  She has been seeing Dr. Rogue Bussing of oncology, her case was discussed at cancer conference and they decided to proceed with IR guided biopsy of this lesion which is scheduled for 2 days from now.  Patient denies any abdominal pains.  She is eating well.  No nausea or vomiting.  She has chronically dark stools from oral iron that she takes.  She had another dose of IV iron last week and is scheduled for another dose in the next day or so.  She is taking omeprazole 40 mg a day.  She does endorse ongoing dyspnea with exertion, wearing 6 L nasal cannula today.  Prior endoscopic work-up: Capsule endosopy 11/26/2016 - normal - Dr. Marius Ditch  EGD 11/26/2016 - Dr. Marius Ditch -  normal esophagus, nodular tissue in the stomach biopsied, erythematous stomach, normal duodenum. H pylori gastritis - treated, She was given triple antibiotic therapy with clarithromycin plus PPI plus amoxicillin for 14 days  Colonoscopy 2016 - diverticulosis and hemorrhoids  Follow up H pylori stool test negative 01/2017   PET scan 02/22/20 - IMPRESSION: 1. 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 2. No findings of distant metastatic spread are identified. 3. Borderline sized paratracheal and AP window lymph nodes are not hypermetabolic. 4.  Other imaging findings of potential clinical significance: Aortic Atherosclerosis (ICD10-I70.0). Coronary atherosclerosis. Mild cardiomegaly. Emphysema (ICD10-J43.9). Mild scarring in the left lower lobe. Scattered colonic diverticulosis with more concentrated diverticulosis in the sigmoid colon. Infrarenal IVC filter. Calcified uterine fibroids. Degenerative disc disease at L5-S1.   CT abdomen / pelvis 02/15/20 - IMPRESSION: 1. Large heterogeneous intraperitoneal left abdominal mass with a large draining  vein, most indicative of malignancy, possibly due to a GI stromal tumor or angiosarcoma. Lymphoma and small-bowel adenocarcinoma are considered less likely. Mass is considered high risk for hemorrhage with respect to percutaneous biopsy. Consider PET in further evaluation, as clinically indicated. These results will be called to the ordering clinician or representative by the Radiologist Assistant, and communication documented in the PACS or [employer]. 2. Aortic atherosclerosis (ICD10-I70.0). Coronary artery calcification. 3. Mesenteric adenopathy is likely metastatic   Past Medical History:  Diagnosis Date  . Asthma 1950  . Clotting disorder (HCC)    right leg  . 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 Glaucoma 1949  . Hyperlipidemia   . Hypertension   . Obesity, unspecified   . Personal history of tobacco use, presenting hazards to health   . Varicose veins of lower extremities with other complications      Past Surgical History:  Procedure Laterality Date  . BREAST BIOPSY Right 1991  . BREAST BIOPSY Left 2013  . COLONOSCOPY  2010   Dr. Vira Agar  . COLONOSCOPY  Jan 2016   Dr Vira Agar  . ESOPHAGOGASTRODUODENOSCOPY N/A 11/26/2016   Procedure: ESOPHAGOGASTRODUODENOSCOPY (EGD);  Surgeon: Lin Landsman, MD;  Location: [facility] ENDOSCOPY;  Service: Gastroenterology;  Laterality: N/A;  . GIVENS CAPSULE STUDY N/A 11/26/2016   Procedure: GIVENS CAPSULE STUDY, if EGD negative, plan to drop capsule with scope if EGD negative;  Surgeon: Lin Landsman, MD;  Location: [facility] ENDOSCOPY;  Service: Gastroenterology;  Laterality: N/A;  . IVC FILTER INSERTION N/A 11/14/2016   Procedure: IVC Filter Insertion;  Surgeon: Katha Cabal, MD;  Location: [place] CV LAB;  Service: Cardiovascular;  Laterality: N/A;  . RIGHT/LEFT HEART CATH AND CORONARY ANGIOGRAPHY N/A 06/30/2019   Procedure: RIGHT/LEFT HEART CATH AND CORONARY ANGIOGRAPHY;  Surgeon: Larey Dresser,  MD;  Location: [place] [facility] LAB;  Service: Cardiovascular;  Laterality: N/A;  . VEIN SURGERY Right 2006   Vein Closure Procedure; RF ablation of right GSV   Family History  Problem Relation Age of Onset  . Prostate cancer Father   . Other Sister        mouth cancer  . Hypertension Mother    Social History   Tobacco Use  . Smoking status: Former Smoker    Packs/day: 1.50    Years: 20.00    Pack years: 30.00    Quit date: 1999    Years since quitting: 22.8  . Smokeless tobacco: Never Used  Vaping Use  . Vaping Use: Never used  Substance Use Topics  . Alcohol use: No    Alcohol/week: 0.0 standard drinks  . Drug use: No   Current Outpatient Medications  Medication Sig Dispense Refill  . acetaminophen (TYLENOL) 325 MG tablet Take 2 tablets (650 mg total) by mouth every 6 (six) hours as needed for mild pain (or Fever >/= 101).    Marland Kitchen aspirin 81 MG chewable tablet Chew 1 tablet (81 mg total) by mouth daily. 30 tablet 0  . FARXIGA 10 MG TABS tablet TAKE 1 TABLET BY MOUTH ONCE DAILY BEFORE  BREAKFAST 90 tablet 0  . ferrous gluconate (FERGON) 325 MG tablet Take 325 mg by mouth in the morning and at bedtime.     . furosemide (LASIX) 40 MG tablet Take 1 tablet (40 mg total) by mouth in the morning AND 0.5 tablets (20 mg total) every evening. 45 tablet 3  . HUMALOG KWIKPEN 100 UNIT/ML KiwkPen Inject 10 Units into the skin 2 (two) times daily.     . insulin glargine (LANTUS) 100 UNIT/ML injection Inject 32 Units into the skin daily.     Marland Kitchen latanoprost (XALATAN) 0.005 % ophthalmic solution Place 1 drop into both eyes at bedtime.     . lovastatin (MEVACOR) 20 MG tablet Take 20 mg by mouth every evening.     . macitentan (OPSUMIT) 10 MG tablet Take 1 tablet (10 mg total) by mouth daily. 30 tablet 11  . metFORMIN (GLUCOPHAGE) 1000 MG tablet Take 1,000 mg by mouth daily.     . Multiple Vitamin (MULTIVITAMIN) tablet Take 1 tablet by mouth daily.    Marland Kitchen omeprazole (PRILOSEC) 40 MG capsule Take 40 mg  by mouth daily.    . potassium chloride SA (KLOR-CON) 20 MEQ tablet Take 1 tablet (20 mEq total) by mouth daily. 90 tablet 3  . tadalafil, PAH, (ALYQ) 20 MG tablet Take 2 tablets (40 mg total) by mouth daily. 60 tablet 11  . timolol (TIMOPTIC) 0.25 % ophthalmic solution Place 1 drop into both eyes 2 (two) times daily.     . Tiotropium Bromide-Olodaterol (STIOLTO RESPIMAT) 2.5-2.5 MCG/ACT AERS Inhale 2 puffs into the lungs daily. 1 g 5   No current facility-administered medications for this visit.   No Known Allergies   Review of Systems: All systems reviewed and negative except where noted in HPI.    CT Abdomen Pelvis W Contrast  Result Date: 02/15/2020 CLINICAL DATA:  Iron deficiency anemia. EXAM: CT ABDOMEN AND PELVIS WITH CONTRAST TECHNIQUE: Multidetector CT imaging of the abdomen and pelvis was performed using the standard protocol following bolus administration of intravenous contrast. CONTRAST:  166m OMNIPAQUE IOHEXOL 300 MG/ML  SOLN COMPARISON:  None. FINDINGS: Lower chest: Minimal subsegmental volume loss in the left lower lobe. Heart is enlarged. Coronary artery calcification. No pericardial or pleural effusion. Distal esophagus is unremarkable. Hepatobiliary: Liver and gallbladder are unremarkable. No biliary ductal dilatation. Pancreas: Negative. Spleen: Negative. Adrenals/Urinary Tract: Adrenal glands and kidneys are unremarkable. Ureters are decompressed. Bladder is unremarkable. Stomach/Bowel: Stomach is unremarkable. Jejunum is intimately associated with a heterogeneous mass in the left abdomen, measuring 9.3 x 9.8 cm (2/34). Small bowel, appendix and colon are otherwise unremarkable. Vascular/Lymphatic: Atherosclerotic calcification of the aorta without aneurysm. IVC filter is in place. Abdominal peritoneal ligament and retroperitoneal lymph nodes are subcentimeter in short axis size. Small bowel mesenteric lymph nodes measure up to 1.6 cm (2/50). Reproductive: Multiple calcified  fibroids in the uterus. No adnexal mass. Other: As partly discussed above, there is a large heterogeneous intraperitoneal mass in the left abdomen measuring 9.3 x 9.8 cm (2/34) with a large dilated draining vein. Flecks of internal calcification are noted. Lesion is well-circumscribed and contacts multiple loops of small bowel. No free fluid. Mesenteries and peritoneum are otherwise unremarkable. Musculoskeletal: No worrisome lytic or sclerotic lesions. IMPRESSION: 1. 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 Lymphoma and small-bowel adenocarcinoma are considered less likely. Mass is considered high risk for hemorrhage with respect to percutaneous  biopsy. Consider PET in further evaluation, as clinically indicated. These results will be called to the ordering clinician or representative by the Radiologist Assistant, and communication documented in the PACS or [employer]. 2. Aortic atherosclerosis (ICD10-I70.0). Coronary artery calcification. 3. 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 Electronically Signed   By: Lorin Picket M.D.   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 EXAM: NUCLEAR MEDICINE PET SKULL BASE TO THIGH TECHNIQUE: 9.7 mCi F-18 FDG was injected intravenously. Full-ring PET imaging was performed from the skull base to thigh after the radiotracer. CT data was obtained and used for attenuation correction and anatomic localization. Fasting blood glucose: 94 mg/dl COMPARISON:  02/15/2020 CT scan FINDINGS: Mediastinal blood pool activity: SUV max 2.9 Liver activity: SUV max NA NECK: No significant abnormal hypermetabolic activity in this region. Incidental CT findings: none CHEST: 1.0 cm right lower paratracheal lymph node on image 76  of series 3 is not appreciably hypermetabolic, maximum SUV 2.7 is less than blood pool. Similarly there is likely an AP window lymph node causing the slight convexity of the AP window, but no hypermetabolic activity to suggest malignant involvement. Incidental CT findings: Coronary, aortic arch, and branch vessel atherosclerotic vascular disease. Mild cardiomegaly. Centrilobular emphysema. Mild left lower lobe scarring along the hemidiaphragm. ABDOMEN/PELVIS: The 10.2 cm left upper quadrant mass has maximum SUV of 6.2, and heterogeneous internal activity with some suspected central necrosis. This mass is immediately adjacent to loops of jejunum and also proximate to the distal transverse colon. Incidental CT findings: Scattered colonic diverticulosis. Aortoiliac atherosclerotic vascular disease. Infrarenal IVC filter. Sigmoid colon diverticulosis. Calcified uterine fibroids. Small muscular lipomas are noted involving the left vastus intermedius and right hip adductor musculature. SKELETON: No significant abnormal hypermetabolic activity in this region. Incidental CT findings: Degenerative endplate sclerosis and degenerative loss of intervertebral disc height at L5-S1. IMPRESSION: 1. 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 2. No findings of distant metastatic spread are identified. 3. Borderline sized paratracheal and AP window lymph nodes are not hypermetabolic. 4. Other imaging findings of potential clinical significance: Aortic Atherosclerosis (ICD10-I70.0). Coronary atherosclerosis. Mild cardiomegaly. Emphysema (ICD10-J43.9). Mild scarring in the left lower lobe. Scattered colonic diverticulosis with more concentrated diverticulosis in the sigmoid colon. Infrarenal IVC filter. Calcified uterine fibroids. Degenerative disc disease at L5-S1. Electronically Signed   By: Van Clines M.D.   On: 02/22/2020 16:26    Physical Exam: BP (!) 121/58   Pulse 75   Ht _0  (1.651 m)   Wt 191 lb (86.6 kg)   SpO2 93%   BMI 31.78 kg/m  Constitutional: Pleasant,well-developed, female in no acute distress. HEENT: Normocephalic and atraumatic.  Neck supple.  Cardiovascular: Normal rate, regular rhythm.  Pulmonary/chest: Effort normal. Decreased BS bilaterally. Abdominal: Soft, nondistended, nontender.  Extremities: no edema Lymphadenopathy: No cervical adenopathy noted. Neurological: Alert and oriented to person place and time. Skin: Skin is warm and dry. No rashes noted. Psychiatric: Normal mood and affect. Behavior is normal.   ASSESSMENT AND PLAN: 74 year old female here for reassessment of the following:  Iron deficiency anemia / abnormal PET scan - as above she has had iron deficiency anemia dating back a few years.  Previously underwent EGD, colonoscopy, capsule endoscopy with another GI practice without  a clear cause.  Since her last visit over the summer, I spoke with her cardiologist, she was thought to be high risk for anesthesia and after discussion with the patient we decided to hold off on repeat endoscopic evaluation and treat with IV iron and await her course.  Hemoglobin actually rose appropriately but is since down trended again.  She has no overt bleeding that she is aware of but has chronic dark stools on oral iron.  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  Discussed the situation with the patient.  She has ongoing dyspnea with exertion, wearing 6 L of oxygen per nasal cannula today.  She remains high risk for anesthesia.  Her case was discussed at the cancer conference and she is scheduled for a biopsy of this mass lesion on Thursday.  Per oncology notes, aggressive therapy likely will not be offered to the patient given her significant  comorbidities.  I will repeat her hemoglobin today to see where this is trending.  She will continue IV iron infusions as scheduled, hopefully she does not require blood transfusion.  We can consider EGD for diagnostic purposes in regards to this mass however again she has significant risks for anesthesia and is scheduled for the IR guided biopsy to help determine underlying pathology.  I will reach out to her oncologist to discuss her case, we are available to assist if needed.  [place] Cellar, MD [facility]

## 2020-02-28 NOTE — Patient Instructions (Signed)
If you are age 74 or older, your body mass index should be between 23-30. Your Body mass index is 31.78 kg/m. If this is out of the aforementioned range listed, please consider follow up with your Primary Care Provider.  If you are age 74 or younger, your body mass index should be between 19-25. Your Body mass index is 31.78 kg/m. If this is out of the aformentioned range listed, please consider follow up with your Primary Care Provider.   Please go to the lab in the basement of our building to have lab work done as you leave today. Hit "B" for basement when you get on the elevator.  When the doors open the lab is on your left.  We will call you with the results. Thank you.  Due to recent changes in healthcare laws, you may see the results of your imaging and laboratory studies on MyChart before your provider has had a chance to review them.  We understand that in some cases there may be results that are confusing or concerning to you. Not all laboratory results come back in the same time frame and the provider may be waiting for multiple results in order to interpret others.  Please give [name] 48 hours in order for your provider to thoroughly review all the results before contacting the office for clarification of your results.   Thank you for entrusting me with your care and for choosing [facility], Dr. [place] Cellar

## 2020-02-29 ENCOUNTER — Other Ambulatory Visit: Payer: Self-pay | Admitting: Student

## 2020-02-29 ENCOUNTER — Inpatient Hospital Stay: Payer: Medicare Other

## 2020-02-29 ENCOUNTER — Other Ambulatory Visit: Payer: Self-pay

## 2020-02-29 VITALS — BP 102/46 | HR 63 | Temp 97.9°F | Resp 16

## 2020-02-29 DIAGNOSIS — D509 Iron deficiency anemia, unspecified: Secondary | ICD-10-CM | POA: Diagnosis not present

## 2020-02-29 DIAGNOSIS — E611 Iron deficiency: Secondary | ICD-10-CM

## 2020-02-29 MED ORDER — SODIUM CHLORIDE 0.9 % IV SOLN
Freq: Once | INTRAVENOUS | Status: AC
  Filled 2020-02-29: qty 250

## 2020-02-29 MED ORDER — IRON SUCROSE 20 MG/ML IV SOLN
200.0000 mg | Freq: Once | INTRAVENOUS | Status: AC
  Administered 2020-02-29: 200 mg via INTRAVENOUS
  Filled 2020-02-29: qty 10

## 2020-02-29 NOTE — Progress Notes (Signed)
Patient tolerated infusion well. Patient and VSS. VS at patient's baseline. Discharged home.

## 2020-03-01 ENCOUNTER — Other Ambulatory Visit: Payer: Self-pay

## 2020-03-01 ENCOUNTER — Ambulatory Visit: Payer: Medicare Other

## 2020-03-01 ENCOUNTER — Ambulatory Visit
Admission: RE | Admit: 2020-03-01 | Discharge: 2020-03-01 | Disposition: A | Discharge status: Home / Self Care | Payer: Medicare Other | Source: Ambulatory Visit | Attending: Internal Medicine | Admitting: Internal Medicine

## 2020-03-01 DIAGNOSIS — R1904 Left lower quadrant abdominal swelling, mass and lump: Secondary | ICD-10-CM | POA: Insufficient documentation

## 2020-03-01 LAB — GLUCOSE, CAPILLARY: Glucose-Capillary: 137 mg/dL — ABNORMAL HIGH (ref 70–99)

## 2020-03-01 MED ORDER — FENTANYL CITRATE (PF) 100 MCG/2ML IJ SOLN
INTRAMUSCULAR | Status: AC | PRN
Start: 2020-03-01 — End: 2020-03-01
  Administered 2020-03-01 (×2): 50 ug via INTRAVENOUS

## 2020-03-01 MED ORDER — FENTANYL CITRATE (PF) 100 MCG/2ML IJ SOLN
INTRAMUSCULAR | Status: AC
  Filled 2020-03-01: qty 2

## 2020-03-01 MED ORDER — SODIUM CHLORIDE 0.9 % IV SOLN: INTRAVENOUS | Status: DC

## 2020-03-01 MED ORDER — MIDAZOLAM HCL 2 MG/2ML IJ SOLN
INTRAMUSCULAR | Status: AC | PRN
  Administered 2020-03-01 (×2): 1 mg via INTRAVENOUS

## 2020-03-01 MED ORDER — MIDAZOLAM HCL 2 MG/2ML IJ SOLN
INTRAMUSCULAR | Status: AC
  Filled 2020-03-01: qty 2

## 2020-03-01 NOTE — Progress Notes (Signed)
Patient tolerated sandwich, coffee and water without event.  Upon discharge "small pin size spot on dressing"   Patient verbalizes understanding of procedure, follow up care.

## 2020-03-01 NOTE — Discharge Instructions (Signed)
Needle Biopsy, Care After These instructions tell you how to care for yourself after your procedure. Your doctor may also give you more specific instructions. Call your doctor if you have any problems or questions. What can I expect after the procedure? After the procedure, it is common to have:  Soreness.  Bruising.  Mild pain. Follow these instructions at home:  1. Return to your normal activities tomorrow. 2. Take over-the-counter and prescription medicines only as told by your doctor. 3. Wash your hands with soap and water before you change your bandage (dressing). If you cannot use soap and water, use hand sanitizer. 4. You may remove your bandage in 2 days 5. Check your puncture site every day for signs of infection. Watch for: ? Redness, swelling, or pain. ? Fluid or blood.  ? Pus or a bad smell. ? Warmth. 6. You can shower tomorrow 7. Keep all follow-up visits as told by your doctor. This is important. Contact a doctor if you have:  A fever.  Redness, swelling, or pain at the puncture site, and it lasts longer than a few days.  Fluid, blood, or pus coming from the puncture site.  Warmth coming from the puncture site. Get help right away if:  You have a lot of bleeding from the puncture site. Summary  After the procedure, it is common to have soreness, bruising, or mild pain at the puncture site.  Check your puncture site every day for signs of infection, such as redness, swelling, or pain.  Get help right away if you have severe bleeding from your puncture site. This information is not intended to replace advice given to you by your health care provider. Make sure you discuss any questions you have with your health care provider. Document Revised: 04/13/2017 Document Reviewed: 04/13/2017 Elsevier Patient Education  2020 [employer].

## 2020-03-01 NOTE — Procedures (Signed)
Interventional Radiology Procedure Note  Procedure: US Guided Biopsy of left abdominal mass  Complications: None  Estimated Blood Loss: < 10 mL  Findings: 18 G core biopsy of left abdominal mass performed under CT guidance.  Four core samples obtained and sent to Pathology.  Venetia Night. Kathlene Cote, M.D Pager:  [phone]

## 2020-03-01 NOTE — Progress Notes (Signed)
Patient clinically stable post Abdominal Mass biopsy per Dr Kathlene Cote, tolerated well. Vitals stable pre and post procedure. Denies complaints at this time. Daughter to bedside. Received Versed 2 mg along with Fentanyl 100 mcg IV for procedure. Report given to [employer] in specials post procedure.

## 2020-03-02 ENCOUNTER — Inpatient Hospital Stay: Payer: Medicare Other | Admitting: Internal Medicine

## 2020-03-02 ENCOUNTER — Ambulatory Visit: Payer: Medicare Other

## 2020-03-02 ENCOUNTER — Other Ambulatory Visit: Payer: Medicare Other

## 2020-03-05 ENCOUNTER — Telehealth: Payer: Self-pay | Admitting: *Deleted

## 2020-03-05 ENCOUNTER — Other Ambulatory Visit: Payer: Self-pay | Admitting: *Deleted

## 2020-03-05 ENCOUNTER — Telehealth: Payer: Self-pay

## 2020-03-05 ENCOUNTER — Inpatient Hospital Stay (HOSPITAL_BASED_OUTPATIENT_CLINIC_OR_DEPARTMENT_OTHER): Payer: Medicare Other | Admitting: Oncology

## 2020-03-05 ENCOUNTER — Inpatient Hospital Stay: Payer: Medicare Other

## 2020-03-05 ENCOUNTER — Other Ambulatory Visit: Payer: Self-pay

## 2020-03-05 ENCOUNTER — Other Ambulatory Visit (INDEPENDENT_AMBULATORY_CARE_PROVIDER_SITE_OTHER): Payer: Medicare Other

## 2020-03-05 DIAGNOSIS — E611 Iron deficiency: Secondary | ICD-10-CM

## 2020-03-05 DIAGNOSIS — D509 Iron deficiency anemia, unspecified: Secondary | ICD-10-CM

## 2020-03-05 DIAGNOSIS — D500 Iron deficiency anemia secondary to blood loss (chronic): Secondary | ICD-10-CM

## 2020-03-05 DIAGNOSIS — R1902 Left upper quadrant abdominal swelling, mass and lump: Secondary | ICD-10-CM

## 2020-03-05 DIAGNOSIS — D649 Anemia, unspecified: Secondary | ICD-10-CM

## 2020-03-05 DIAGNOSIS — R948 Abnormal results of function studies of other organs and systems: Secondary | ICD-10-CM

## 2020-03-05 LAB — CBC WITH DIFFERENTIAL/PLATELET
Abs Immature Granulocytes: 0.05 10*3/uL (ref 0.00–0.07)
Basophils Absolute: 0 10*3/uL (ref 0.0–0.1)
Basophils Relative: 0 %
Eosinophils Absolute: 0 10*3/uL (ref 0.0–0.5)
Eosinophils Relative: 0 %
HCT: 25 % — ABNORMAL LOW (ref 36.0–46.0)
Hemoglobin: 7.3 g/dL — ABNORMAL LOW (ref 12.0–15.0)
Immature Granulocytes: 1 %
Lymphocytes Relative: 9 %
Lymphs Abs: 0.8 10*3/uL (ref 0.7–4.0)
MCH: 29 pg (ref 26.0–34.0)
MCHC: 29.2 g/dL — ABNORMAL LOW (ref 30.0–36.0)
MCV: 99.2 fL (ref 80.0–100.0)
Monocytes Absolute: 0.7 10*3/uL (ref 0.1–1.0)
Monocytes Relative: 8 %
Neutro Abs: 7.5 10*3/uL (ref 1.7–7.7)
Neutrophils Relative %: 82 %
Platelets: 248 10*3/uL (ref 150–400)
RBC: 2.52 MIL/uL — ABNORMAL LOW (ref 3.87–5.11)
RDW: 18.8 % — ABNORMAL HIGH (ref 11.5–15.5)
WBC: 9.1 10*3/uL (ref 4.0–10.5)
nRBC: 0.8 % — ABNORMAL HIGH (ref 0.0–0.2)

## 2020-03-05 LAB — HEMOGLOBIN: Hemoglobin: 7 g/dL — CL (ref 12.0–15.0)

## 2020-03-05 LAB — PREPARE RBC (CROSSMATCH)

## 2020-03-05 NOTE — Telephone Encounter (Signed)
Spoke with patient , she denies any signs of bleeding from biopsy site, denies any blood in stool, she denies any weakness or increase in shortness of breath. Pt states that her next infusion is 03/12/20, pt states that she thinks it may be a blood transfusion because its a 3 hour appt. Will forward biopsy results to Dr. Rogue Bussing for review.

## 2020-03-05 NOTE — Telephone Encounter (Signed)
Lm on vm for Triage RN [place] at [facility] to return call in regards to critical lab results and next steps. Provided them my direct number for any questions or concerns.

## 2020-03-05 NOTE — Progress Notes (Signed)
Please see 03/05/20 phone note for more information.

## 2020-03-05 NOTE — Telephone Encounter (Signed)
Lab called with critical hemoglobin 7.0 on patient. Dr. Havery Moros is out of the office today.

## 2020-03-05 NOTE — Telephone Encounter (Signed)
-----  Message from Yevette Edwards, RN sent at 02/28/2020 12:37 PM EST ----- Regarding: Labs Repeat hemoglobin, order in epic

## 2020-03-05 NOTE — Telephone Encounter (Signed)
Hemoglobin was 8 a week ago.  Please contact patient to find out symptoms, she has chronic baseline dyspnea, see if she has had any signs of bleeding especially at the biopsy site, she had percutaneous biopsy of a mass last week as well.

## 2020-03-05 NOTE — Telephone Encounter (Signed)
Patient contacted re: blood transfusion in SDS. Patient will arrive at 7am.

## 2020-03-05 NOTE — Telephone Encounter (Signed)
Agree with referring this to hem-onc

## 2020-03-05 NOTE — Telephone Encounter (Signed)
Appreciate everyone's help with this. Looks like she was seen by Heme Onc today and scheduled for a blood transfusion ASAP. Thanks

## 2020-03-05 NOTE — Progress Notes (Signed)
Virtual Visit via Video Note  I connected with Susan Barajas on 03/05/20 at  3:00 PM EST by a video enabled telemedicine application and verified that I am speaking with the correct person using two identifiers.  Location: Patient: Home Provider: Clinic    I discussed the limitations of evaluation and management by telemedicine and the availability of in person appointments. The patient expressed understanding and agreed to proceed.  History of Present Illness: Susan Barajas is a 74 year old female with past medical history significant for COPD, OSA, hyponatremia, iron deficiency anemia, pulmonary hypertension who is followed by Dr. Rogue Bussing for iron deficiency anemia.  She was last given IV Venofer on 02/29/2020.  Hemoglobins have been ranging from low sevens to 10 for the past 4 years.  Etiology thought to be d/t a 10 cm abdominal mass.  Recently had a PET scan on February 21, 2020 which showed hypermetabolic activity concerning for malignancy.  Had biopsy on 02/23/20.  Awaiting biopsy results.   Patient was seen today by her primary care provider today and found to have a hemoglobin of 7.  PCP concerned given 1 week ago her hemoglobin was 8.  She denies any bleeding.  She is having normal bowel movements.  She is not symptomatic.  She denies any shortness of breath, increased fatigue or dizziness.   Observations/Objective: Review of Systems  Constitutional: Negative.  Negative for chills, fever, malaise/fatigue and weight loss.  HENT: Negative for congestion, ear pain and tinnitus.   Eyes: Negative.  Negative for blurred vision and double vision.  Respiratory: Negative.  Negative for cough, sputum production and shortness of breath.   Cardiovascular: Negative.  Negative for chest pain, palpitations and leg swelling.  Gastrointestinal: Negative.  Negative for abdominal pain, constipation, diarrhea, nausea and vomiting.  Genitourinary: Negative for dysuria, frequency and urgency.   Musculoskeletal: Negative for back pain and falls.  Skin: Negative.  Negative for rash.  Neurological: Negative.  Negative for weakness and headaches.  Endo/Heme/Allergies: Negative.  Does not bruise/bleed easily.  Psychiatric/Behavioral: Negative.  Negative for depression. The patient is not nervous/anxious and does not have insomnia.    Assessment and Plan:  Physical Exam -Limited secondary to telephone visit. -Patient does not sound that she is in distress.  She is not short of breath.  She is speaking in complete sentences.  Follow Up Instructions: Iron deficiency anemia secondary to 10 cm abdominal mass: -She has been anemic with hemoglobins ranging from low sevens to as high as 10 since 2018. -Has labwork weekly by her PCP and was told her hemoglobin was 7 today. -Etiology secondary to a-bleeding GI mass. -PET scan showing hypermetabolic activity.  -Recently biopsied and we are awaiting results. -She denies active bleeding at this time. -She is not symptomatic. -We will attempt to get her set up for a unit of blood either tomorrow or Wednesday.  Heather, RN is determining if short stay is an option for a blood transfusion.  She knows to report to the emergency room if symptoms worsen or she notices blood.  Disposition: -Will get blood transfusion scheduled asap.  -RTC as scheduled next week.   I discussed the assessment and treatment plan with the patient. The patient was provided an opportunity to ask questions and all were answered. The patient agreed with the plan and demonstrated an understanding of the instructions.   The patient was advised to call back or seek an in-person evaluation if the symptoms worsen or if the condition fails to improve as  anticipated.  I provided 15 minutes of non-face-to-face time during this encounter.   Jacquelin Hawking, NP

## 2020-03-05 NOTE — Telephone Encounter (Signed)
Brooklyn from [facility] called asking if Dr B got the critical lab results they faxed over and said patient may be in need of a blood transfusion  Hemoglobin Order: [id number] Status:  Final result Visible to patient:  No (scheduled for 03/05/2020 11:09 AM) Next appt:  03/12/2020 at 09:00 AM in Oncology ([facility]) Dx:  Abnormal gastrointestinal PET scan; I...  2 Result Notes  Ref Range & Units 09:13 6 d ago  Hemoglobin 12.0 - 15.0 g/dL 7.0 Repeated and verified X2.Low Panic  8.0 Repeated and verified X2.Low Panic   [place]    Narrative Performed by: Zannie Kehr Critical result called to heather on 03/05/2020 10:08 AM by Delorise Jackson. Results were read back to caller. (HGB)    Specimen Collected: 03/05/20 09:13 Last Resulted: 03/05/20 10:09     Lab Flowsheet   Order Details   View Encounter   Lab and Collection Details   Routing   Result History       Result Care Coordination  Result Notes  Yevette Edwards, [place]  03/05/2020 1:48 PM EST Back to Top    Please see 03/05/20 phone note for more information.   Yetta Flock, MD  03/05/2020 12:39 PM EST     Brooklyn I am out of the office today but just happened to look at the results. Hgb dropped from 8 roughly one week ago to 7.0 this AM. She just had a biopsy of an abdominal mass per IR. Can you touch base with her to see how she is feeling and if she has any bleeding symptoms? I think she will need a blood transfusion at this point given she has downtrending Hgb without much reserve. Not sure if her oncologist is aware of this, if you can let their office know. Would recommend 1 unit RBC transfusion if you can help coordinate this or see if the oncologist office wants to do this, to be done ASAP. If she can't get it done in the next few days, please let me know, would need to go to the ED if she is symptomatic for acute transfusion. Thank you

## 2020-03-05 NOTE — Telephone Encounter (Signed)
Patient has already had labs drawn today, will wait results.

## 2020-03-05 NOTE — Telephone Encounter (Signed)
Scheduled virtual visit today with Sonia Baller, NP.  Patient will receive 1 unit of blood in SDS tomorrow at 7am. Patient will come to clinic today by 4Pm for lab draw in prep for Type/Screen/ prepare rbc.

## 2020-03-06 ENCOUNTER — Telehealth: Payer: Self-pay | Admitting: Pharmacy Technician

## 2020-03-06 ENCOUNTER — Ambulatory Visit
Admission: RE | Admit: 2020-03-06 | Discharge: 2020-03-06 | Disposition: A | Discharge status: Home / Self Care | Payer: Medicare Other | Source: Ambulatory Visit | Attending: Internal Medicine | Admitting: Internal Medicine

## 2020-03-06 ENCOUNTER — Telehealth: Payer: Self-pay | Admitting: Internal Medicine

## 2020-03-06 ENCOUNTER — Telehealth: Payer: Self-pay | Admitting: Pharmacist

## 2020-03-06 ENCOUNTER — Other Ambulatory Visit: Payer: Self-pay

## 2020-03-06 ENCOUNTER — Other Ambulatory Visit: Payer: Self-pay | Admitting: *Deleted

## 2020-03-06 DIAGNOSIS — D500 Iron deficiency anemia secondary to blood loss (chronic): Secondary | ICD-10-CM | POA: Diagnosis present

## 2020-03-06 DIAGNOSIS — D649 Anemia, unspecified: Secondary | ICD-10-CM | POA: Diagnosis not present

## 2020-03-06 DIAGNOSIS — C49A3 Gastrointestinal stromal tumor of small intestine: Secondary | ICD-10-CM

## 2020-03-06 DIAGNOSIS — D509 Iron deficiency anemia, unspecified: Secondary | ICD-10-CM | POA: Diagnosis not present

## 2020-03-06 MED ORDER — IMATINIB MESYLATE 100 MG PO TABS: 400.0000 mg | ORAL_TABLET | Freq: Every day | ORAL | 1 refills | Status: DC

## 2020-03-06 MED ORDER — SODIUM CHLORIDE 0.9% IV SOLUTION: 250.0000 mL | Freq: Once | INTRAVENOUS | Status: DC

## 2020-03-06 MED ORDER — ACETAMINOPHEN 325 MG PO TABS
650.0000 mg | ORAL_TABLET | Freq: Once | ORAL | Status: AC
  Administered 2020-03-06: 650 mg via ORAL

## 2020-03-06 MED ORDER — DIPHENHYDRAMINE HCL 25 MG PO CAPS
ORAL_CAPSULE | ORAL | Status: AC
  Filled 2020-03-06: qty 1

## 2020-03-06 MED ORDER — DIPHENHYDRAMINE HCL 25 MG PO CAPS
25.0000 mg | ORAL_CAPSULE | Freq: Once | ORAL | Status: AC
  Administered 2020-03-06: 25 mg via ORAL

## 2020-03-06 MED ORDER — ACETAMINOPHEN 325 MG PO TABS
ORAL_TABLET | ORAL | Status: AC
  Filled 2020-03-06: qty 2

## 2020-03-06 NOTE — Telephone Encounter (Signed)
On 11/23- prelim update from pathology- GIST.  Awaiting on stains at this time.  Recommend checking for GLEEVEC 457m/day; given pt's recurrent severe anemia/tenuous respiratory-cardiac status.  We will plan to start treatment only when pathology is confirmed.  Will reach out to pt later in the afternoon with this update. Follow up as planned.

## 2020-03-06 NOTE — Telephone Encounter (Signed)
Oral Oncology Pharmacist Encounter  Received new prescription for Gleevec (imatinib) for the treatment of GIST.  BMP from 02/10/20 assessed, no relevant lab abnormalities. Prescription dose and frequency assessed.   Current medication list in Epic reviewed, several DDIs with imatinib identified: -Imatinib may increase the concentration of lovastatin, tadalafil, and macitentan. Monitor patient for increased toxicity of the list medications. No baseline dose adjustments needed.  Evaluated chart and no patient barriers to medication adherence identified.   Prescription has been e-scribed to the [facility] for benefits analysis and approval.  Oral Oncology Clinic will continue to follow for insurance authorization, copayment issues, initial counseling and start date.  Darl Pikes, PharmD, BCPS, BCOP, CPP Hematology/Oncology Clinical Pharmacist Practitioner [facility] [phone]  03/06/2020 2:32 PM

## 2020-03-06 NOTE — Telephone Encounter (Signed)
Oral Oncology Patient Advocate Encounter  Received notification from [employer] that prior authorization for Gleevec (Imatinib)  is required.  PA submitted on CoverMyMeds [place] Status is pending  Oral Oncology Clinic will continue to follow.  [place] Patient Page Phone [phone] Fax [phone] 03/07/2020 9:38 AM

## 2020-03-07 ENCOUNTER — Other Ambulatory Visit: Payer: Self-pay | Admitting: Internal Medicine

## 2020-03-07 ENCOUNTER — Other Ambulatory Visit: Payer: Self-pay | Admitting: Anatomic Pathology & Clinical Pathology

## 2020-03-07 ENCOUNTER — Telehealth: Payer: Self-pay | Admitting: Internal Medicine

## 2020-03-07 LAB — TYPE AND SCREEN
ABO/RH(D): B POS
Antibody Screen: NEGATIVE
Unit division: 0

## 2020-03-07 LAB — BPAM RBC
Blood Product Expiration Date: 202112172359
ISSUE DATE / TIME: 202111230722
Unit Type and Rh: 7300

## 2020-03-07 LAB — SURGICAL PATHOLOGY

## 2020-03-07 MED FILL — IMATINIB MESYLATE 100 MG TA: 100 | 30 days supply | Qty: 120 | Fill #0

## 2020-03-07 NOTE — Telephone Encounter (Signed)
On 11/23 -I spoke to patient regarding the preliminary results of her GIST tumor; awaiting final pathology.  However I want to get started on prescription for Gleevec-to get started as soon as possible given her tenuous cardiorespiratory status severe anemia.  Patient agreement.  Alysson/Judy-appreciate your follow-up. Thanks

## 2020-03-07 NOTE — Progress Notes (Signed)
On TB for 12/02.  GB

## 2020-03-07 NOTE — Telephone Encounter (Signed)
Oral Chemotherapy Pharmacist Encounter  [place] approved by the insurance with a copay of $0. [facility] will ship medication today 11/24 for delivery Friday 11/26 or Monday 11/29 (depending on the holiday). Ms. Vaughan knows Dr. Rogue Bussing will discuss the medication with her more during her appt on 11/29 and give her the go ahead on when to get started. She stated her understanding of the plan.  Patient Education I spoke with patient for overview of new oral chemotherapy medication: Gleevec (imatinib) for the treatment of GIST, planned duration until disease progression or unacceptable drug toxicity.   Pt is doing well. Counseled patient on administration, dosing, side effects, monitoring, drug-food interactions, safe handling, storage, and disposal. Patient will take 4 tablets (400 mg total) by mouth daily.  Side effects include but not limited to: diarrhea, rash, N/V, fatigue, edema, decreased wbc.    Reviewed with patient importance of keeping a medication schedule and plan for any missed doses.  After discussion with patient no patient barriers to medication adherence identified.   Ms. Reiger voiced understanding and appreciation. All questions answered. Medication handout provided.  Provided patient with [facility] phone number. Patient knows to call the office with questions or concerns. Oral Chemotherapy Navigation Clinic will continue to follow.  Darl Pikes, PharmD, BCPS, BCOP, CPP Hematology/Oncology Clinical Pharmacist Practitioner [facility] [phone]  03/07/2020 2:14 PM

## 2020-03-12 ENCOUNTER — Inpatient Hospital Stay (HOSPITAL_BASED_OUTPATIENT_CLINIC_OR_DEPARTMENT_OTHER): Payer: Medicare Other | Admitting: Internal Medicine

## 2020-03-12 ENCOUNTER — Inpatient Hospital Stay: Payer: Medicare Other

## 2020-03-12 VITALS — BP 113/66 | HR 59 | Resp 20

## 2020-03-12 DIAGNOSIS — D500 Iron deficiency anemia secondary to blood loss (chronic): Secondary | ICD-10-CM

## 2020-03-12 DIAGNOSIS — E611 Iron deficiency: Secondary | ICD-10-CM

## 2020-03-12 DIAGNOSIS — C49A3 Gastrointestinal stromal tumor of small intestine: Secondary | ICD-10-CM | POA: Diagnosis not present

## 2020-03-12 DIAGNOSIS — D509 Iron deficiency anemia, unspecified: Secondary | ICD-10-CM | POA: Diagnosis not present

## 2020-03-12 LAB — COMPREHENSIVE METABOLIC PANEL
ALT: 12 U/L (ref 0–44)
AST: 15 U/L (ref 15–41)
Albumin: 3.5 g/dL (ref 3.5–5.0)
Alkaline Phosphatase: 46 U/L (ref 38–126)
Anion gap: 9 (ref 5–15)
BUN: 22 mg/dL (ref 8–23)
CO2: 25 mmol/L (ref 22–32)
Calcium: 8.7 mg/dL — ABNORMAL LOW (ref 8.9–10.3)
Chloride: 104 mmol/L (ref 98–111)
Creatinine, Ser: 0.57 mg/dL (ref 0.44–1.00)
GFR, Estimated: >60 mL/min (ref >60)
Glucose, Bld: 197 mg/dL — ABNORMAL HIGH (ref 70–99)
Potassium: 4.1 mmol/L (ref 3.5–5.1)
Sodium: 138 mmol/L (ref 135–145)
Total Bilirubin: 0.3 mg/dL (ref 0.3–1.2)
Total Protein: 6.6 g/dL (ref 6.5–8.1)

## 2020-03-12 LAB — CBC WITH DIFFERENTIAL/PLATELET
Abs Immature Granulocytes: 0.04 10*3/uL (ref 0.00–0.07)
Basophils Absolute: 0 10*3/uL (ref 0.0–0.1)
Basophils Relative: 0 %
Eosinophils Absolute: 0 10*3/uL (ref 0.0–0.5)
Eosinophils Relative: 0 %
HCT: 28.2 % — ABNORMAL LOW (ref 36.0–46.0)
Hemoglobin: 8.1 g/dL — ABNORMAL LOW (ref 12.0–15.0)
Immature Granulocytes: 1 %
Lymphocytes Relative: 7 %
Lymphs Abs: 0.5 10*3/uL — ABNORMAL LOW (ref 0.7–4.0)
MCH: 28 pg (ref 26.0–34.0)
MCHC: 28.7 g/dL — ABNORMAL LOW (ref 30.0–36.0)
MCV: 97.6 fL (ref 80.0–100.0)
Monocytes Absolute: 0.5 10*3/uL (ref 0.1–1.0)
Monocytes Relative: 7 %
Neutro Abs: 6.6 10*3/uL (ref 1.7–7.7)
Neutrophils Relative %: 85 %
Platelets: 245 10*3/uL (ref 150–400)
RBC: 2.89 MIL/uL — ABNORMAL LOW (ref 3.87–5.11)
RDW: 17.1 % — ABNORMAL HIGH (ref 11.5–15.5)
WBC: 7.8 10*3/uL (ref 4.0–10.5)
nRBC: 0 % (ref 0.0–0.2)

## 2020-03-12 LAB — SAMPLE TO BLOOD BANK

## 2020-03-12 MED ORDER — ONDANSETRON HCL 8 MG PO TABS: ORAL_TABLET | ORAL | 1 refills | Status: DC

## 2020-03-12 MED ORDER — SODIUM CHLORIDE 0.9 % IV SOLN
Freq: Once | INTRAVENOUS | Status: AC
  Filled 2020-03-12: qty 250

## 2020-03-12 MED ORDER — IRON SUCROSE 20 MG/ML IV SOLN
200.0000 mg | Freq: Once | INTRAVENOUS | Status: AC
  Administered 2020-03-12: 200 mg via INTRAVENOUS
  Filled 2020-03-12: qty 10

## 2020-03-12 NOTE — Assessment & Plan Note (Addendum)
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  I reviewed the pathology and stage with the patient and daughter in detail.  #Given the localized disease I would recommend evaluation with surgery; referral with Dr. Bary Castilla.  However they do understand that given her tenuous respiratory status-upfront surgery is prohibitive.  We will plan to proceed with neoadjuvant Gleevec 400 mg/day.  I reviewed the potential mechanism of action and side effects of Gleevec including but not limited to nausea vomiting diarrhea skin rash weight gain swelling in the legs.  Again reviewed the administration instructions.  #CHF/COPD home O2 4-6Lits;-borderline respiratory status- STABLE.   #Iron deficient anemia [[may 2021]-on saturation 3%; Ferritin-5.]- sec to small bowel GIST.  Today hemoglobin is 8.1.  Hold off PRBC transfusion.  Proceed with Venofer today.  Discussed that delivery should also help with the bleeding.  # DISPOSITION:.   # Venofer TODAY;  # referral to Dr.Byrnett- GIST # follow up in 2 weeks-MD; labs-cbc/cmp;venofer- Dr.B  Dr.Tate;Dr.Arnbruster.    # 40 minutes face-to-face with the patient discussing the above plan of care; more than 50% of time spent on prognosis/ natural history; counseling and coordination.

## 2020-03-12 NOTE — Patient Instructions (Addendum)
#  START GLEEVEC  400 mg [4 pills of 100 mg pills]TODAY- with meals/dinner; with large glass of water

## 2020-03-12 NOTE — Progress Notes (Signed)
Patient tolerated infusion well. Discharged home.

## 2020-03-12 NOTE — Progress Notes (Signed)
[place] NOTE  Patient Care Team: Albina Billet, MD as PCP - General (Unknown Physician Specialty) Wellington Hampshire, MD as PCP - Cardiology (Cardiology) Christene Lye, MD (General Surgery) Albina Billet, MD (Internal Medicine) Delana Meyer, Dolores Lory, MD (Vascular Surgery) Wilford Corner, MD as Consulting Physician (Gastroenterology) Cammie Sickle, MD as Consulting Physician (Hematology and Oncology)  CHIEF COMPLAINTS/PURPOSE OF CONSULTATION: Anemia   HEMATOLOGY HISTORY  Oncology History Overview Note  NOV 2021- CT Bx- TUMOR  Tumor Site: Peritoneum, jejunal  Histologic Type: Gastrointestinal stromal tumor, spindle cell type  Mitotic Rate: 1 per 5 mm squared  Histologic Grade: G1, low grade (mitotic rate less than or equal to 5  per 5 mm squared)- STAGE II; CT A/P-9.8 x9.8 cm  # NOV 29th 2021- GLEEVEC 400 mg/day.     # CHRONIC ANEMIA EGD/capsule-WNL-2018 [Dr.vanga]-UGIB ; colonoscopy-2018 [Elliot] ; capsule-not done; hemoglobin around 8.8; iron saturation 5% [Dr.Tate].  Poor tolerance of p.o. iron; [Dr.Armbruster;  GI July 2021]-high risk of anesthesia; on IV venofer  # CHF/[Dr.Mclaheny]; COPD [Dr.Kasa]- on Home 4-6lit/ O2  .   GIST (gastrointestinal stromal tumor) of small bowel, malignant ([place])  03/12/2020 Initial Diagnosis   GIST (gastrointestinal stromal tumor) of small bowel, malignant (HCC)      HISTORY OF PRESENTING ILLNESS:  Susan Barajas 74 y.o.  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  In the interim patient noted to have severe anemia hemoglobin around 7.  Patient received PRBC transfusion.  Patient also receiving IV Venofer.  Post biopsy uneventful.  Otherwise no nausea no vomiting.  No headaches.  Chronic shortness of breath with minimal exertion.  Patient denies any obvious blood in  stools.  Review of Systems  Constitutional: Positive for malaise/fatigue. Negative for chills, diaphoresis, fever and weight loss.  HENT: Negative for nosebleeds and sore throat.   Eyes: Negative for double vision.  Respiratory: Positive for shortness of breath. Negative for cough, hemoptysis, sputum production and wheezing.   Cardiovascular: Negative for chest pain, palpitations, orthopnea and leg swelling.  Gastrointestinal: Negative for abdominal pain, blood in stool, constipation, diarrhea, heartburn, melena, nausea and vomiting.  Genitourinary: Negative for dysuria, frequency and urgency.  Musculoskeletal: Negative for back pain and joint pain.  Skin: Negative.  Negative for itching and rash.  Neurological: Negative for dizziness, tingling, focal weakness, weakness and headaches.  Endo/Heme/Allergies: Does not bruise/bleed easily.  Psychiatric/Behavioral: Negative for depression. The patient is not nervous/anxious and does not have insomnia.     MEDICAL HISTORY:  Past Medical History:  Diagnosis Date  . Asthma 1950  . Clotting disorder (HCC)    right leg  . 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 Glaucoma 1949  . Hyperlipidemia   . Hypertension   . Obesity, unspecified   . Personal history of tobacco use, presenting hazards to health   . Varicose veins of lower extremities with other complications     SURGICAL HISTORY: Past Surgical History:  Procedure Laterality Date  . BREAST BIOPSY Right 1991  . BREAST BIOPSY Left 2013  . COLONOSCOPY  2010   Dr. Vira Agar  . COLONOSCOPY  Jan 2016   Dr Vira Agar  . ESOPHAGOGASTRODUODENOSCOPY N/A 11/26/2016   Procedure: ESOPHAGOGASTRODUODENOSCOPY (EGD);  Surgeon: Lin Landsman, MD;  Location: [facility] ENDOSCOPY;  Service: Gastroenterology;  Laterality: N/A;  . GIVENS  CAPSULE STUDY N/A 11/26/2016   Procedure: GIVENS CAPSULE STUDY, if EGD negative, plan to drop capsule with scope if EGD negative;  Surgeon: Lin Landsman, MD;  Location: [facility] ENDOSCOPY;  Service: Gastroenterology;  Laterality: N/A;  . IVC FILTER INSERTION N/A 11/14/2016   Procedure: IVC Filter Insertion;  Surgeon: Katha Cabal, MD;  Location: [facility] CV LAB;  Service: Cardiovascular;  Laterality: N/A;  . RIGHT/LEFT HEART CATH AND CORONARY ANGIOGRAPHY N/A 06/30/2019   Procedure: RIGHT/LEFT HEART CATH AND CORONARY ANGIOGRAPHY;  Surgeon: Larey Dresser, MD;  Location: [place] [facility] LAB;  Service: Cardiovascular;  Laterality: N/A;  . VEIN SURGERY Right 2006   Vein Closure Procedure; RF ablation of right GSV    SOCIAL HISTORY: Social History   Socioeconomic History  . Marital status: Single    Spouse name: Not on file  . Number of children: 2  . Years of education: Not on file  . Highest education level: Not on file  Occupational History  . Occupation: CELL    Employer: RETIRED  Tobacco Use  . Smoking status: Former Smoker    Packs/day: 1.50    Years: 20.00    Pack years: 30.00    Quit date: 1999    Years since quitting: 22.9  . Smokeless tobacco: Never Used  Vaping Use  . Vaping Use: Never used  Substance and Sexual Activity  . Alcohol use: No    Alcohol/week: 0.0 standard drinks  . Drug use: No  . Sexual activity: Not Currently  Other Topics Concern  . Not on file  Social History Narrative   Independent at baseline. Lives at home with her brother; in [place]. Quit smoking 20 years; no alcohol.    Social Determinants of Health   Financial Resource Strain:   . Difficulty of Paying Living Expenses: Not on file  Food Insecurity:   . Worried About Charity fundraiser in the Last Year: Not on file  . Ran Out of Food in the Last Year: Not on file  Transportation Needs:   . Lack of Transportation (Medical): Not on file  . Lack of Transportation (Non-Medical): Not on file  Physical Activity:   . Days of Exercise per Week: Not on file  . Minutes of Exercise per Session: Not on file  Stress:   . Feeling of  Stress : Not on file  Social Connections:   . Frequency of Communication with Friends and Family: Not on file  . Frequency of Social Gatherings with Friends and Family: Not on file  . Attends Religious Services: Not on file  . Active Member of Clubs or Organizations: Not on file  . Attends Archivist Meetings: Not on file  . Marital Status: Not on file  Intimate Partner Violence:   . Fear of Current or Ex-Partner: Not on file  . Emotionally Abused: Not on file  . Physically Abused: Not on file  . Sexually Abused: Not on file    FAMILY HISTORY: Family History  Problem Relation Age of Onset  . Prostate cancer Father   . Other Sister        mouth cancer  . Hypertension Mother     ALLERGIES:  has No Known Allergies.  MEDICATIONS:  Current Outpatient Medications  Medication Sig Dispense Refill  . acetaminophen (TYLENOL) 325 MG tablet Take 2 tablets (650 mg total) by mouth every 6 (six) hours as needed for mild pain (or Fever >/= 101).    Marland Kitchen aspirin 81  MG chewable tablet Chew 1 tablet (81 mg total) by mouth daily. 30 tablet 0  . FARXIGA 10 MG TABS tablet TAKE 1 TABLET BY MOUTH ONCE DAILY BEFORE BREAKFAST 90 tablet 0  . ferrous gluconate (FERGON) 325 MG tablet Take 325 mg by mouth in the morning and at bedtime.     . furosemide (LASIX) 40 MG tablet Take 1 tablet (40 mg total) by mouth in the morning AND 0.5 tablets (20 mg total) every evening. 45 tablet 3  . HUMALOG KWIKPEN 100 UNIT/ML KiwkPen Inject 10 Units into the skin 2 (two) times daily.     . insulin glargine (LANTUS) 100 UNIT/ML injection Inject 32 Units into the skin daily.     Marland Kitchen latanoprost (XALATAN) 0.005 % ophthalmic solution Place 1 drop into both eyes at bedtime.     . lovastatin (MEVACOR) 20 MG tablet Take 20 mg by mouth every evening.     . macitentan (OPSUMIT) 10 MG tablet Take 1 tablet (10 mg total) by mouth daily. 30 tablet 11  . metFORMIN (GLUCOPHAGE) 1000 MG tablet Take 1,000 mg by mouth daily.     .  Multiple Vitamin (MULTIVITAMIN) tablet Take 1 tablet by mouth daily.    Marland Kitchen omeprazole (PRILOSEC) 40 MG capsule Take 40 mg by mouth daily.    . potassium chloride SA (KLOR-CON) 20 MEQ tablet Take 1 tablet (20 mEq total) by mouth daily. 90 tablet 3  . tadalafil, PAH, (ALYQ) 20 MG tablet Take 2 tablets (40 mg total) by mouth daily. 60 tablet 11  . timolol (TIMOPTIC) 0.25 % ophthalmic solution Place 1 drop into both eyes 2 (two) times daily.     . Tiotropium Bromide-Olodaterol (STIOLTO RESPIMAT) 2.5-2.5 MCG/ACT AERS Inhale 2 puffs into the lungs daily. 1 g 5  . imatinib (GLEEVEC) 100 MG tablet Take 4 tablets (400 mg total) by mouth daily. Take with meals and large glass of water (Patient not taking: Reported on 03/12/2020) 120 tablet 1  . ondansetron (ZOFRAN) 8 MG tablet One pill every 8 hours as needed for nausea/vomitting. 45 tablet 1   No current facility-administered medications for this visit.      PHYSICAL EXAMINATION:   Vitals:   03/12/20 0921  BP: (!) 153/48  Pulse: 88  Resp: (!) 22  Temp: (!) 97.3 F (36.3 C)  SpO2: 95%   Filed Weights   03/12/20 0921  Weight: 190 lb 12.8 oz (86.5 kg)    Physical Exam Constitutional:      Comments: 6 L of oxygen nasal cannula.  Walk independently. She is alone.  HENT:     Head: Normocephalic and atraumatic.     Mouth/Throat:     Pharynx: No oropharyngeal exudate.  Eyes:     Pupils: Pupils are equal, round, and reactive to light.  Cardiovascular:     Rate and Rhythm: Normal rate and regular rhythm.  Pulmonary:     Effort: No respiratory distress.     Breath sounds: No wheezing.     Comments: Decreased breath sounds. Abdominal:     General: Bowel sounds are normal. There is no distension.     Palpations: Abdomen is soft. There is no mass.     Tenderness: There is no abdominal tenderness. There is no guarding or rebound.  Musculoskeletal:        General: No tenderness. Normal range of motion.     Cervical back: Normal range of  motion and neck supple.  Skin:    General: Skin is warm.  Neurological:     Mental Status: She is alert and oriented to person, place, and time.  Psychiatric:        Mood and Affect: Affect normal.     LABORATORY DATA:  I have reviewed the data as listed Lab Results  Component Value Date   WBC 7.8 03/12/2020   HGB 8.1 (L) 03/12/2020   HCT 28.2 (L) 03/12/2020   MCV 97.6 03/12/2020   PLT 245 03/12/2020   Recent Labs    08/15/19 1450 08/15/19 1450 08/23/19 1618 08/23/19 1618 10/28/19 1213 02/10/20 1246 03/12/20 0856  NA 136   < > 139   < > 138 137 138  K 4.3   < > 4.3   < > 4.2 4.0 4.1  CL 102   < > 103   < > 107 103 104  CO2 26   < > 25   < > _0 GLUCOSE 202*   < > 140*   < > 103* 155* 197*  BUN 15   < > 13   < > 14 25* 22  CREATININE 0.95   < > 0.88   < > 0.78 0.74 0.57  CALCIUM 8.6*   < > 9.2   < > 8.6* 8.8* 8.7*  GFRNONAA 59*   < > >60   < > >60 >60 >60  GFRAA >60  --  >60  --  >60  --   --   PROT 6.8  --   --   --   --   --  6.6  ALBUMIN 3.7  --   --   --   --   --  3.5  AST 17  --   --   --   --   --  15  ALT 13  --   --   --   --   --  12  ALKPHOS 51  --   --   --   --   --  46  BILITOT 0.5  --   --   --   --   --  0.3   < > = values in this interval not displayed.     CT Abdomen Pelvis W Contrast  Result Date: 02/15/2020 CLINICAL DATA:  Iron deficiency anemia. EXAM: CT ABDOMEN AND PELVIS WITH CONTRAST TECHNIQUE: Multidetector CT imaging of the abdomen and pelvis was performed using the standard protocol following bolus administration of intravenous contrast. CONTRAST:  141m OMNIPAQUE IOHEXOL 300 MG/ML  SOLN COMPARISON:  None. FINDINGS: Lower chest: Minimal subsegmental volume loss in the left lower lobe. Heart is enlarged. Coronary artery calcification. No pericardial or pleural effusion. Distal esophagus is unremarkable. Hepatobiliary: Liver and gallbladder are unremarkable. No biliary ductal dilatation. Pancreas: Negative. Spleen: Negative.  Adrenals/Urinary Tract: Adrenal glands and kidneys are unremarkable. Ureters are decompressed. Bladder is unremarkable. Stomach/Bowel: Stomach is unremarkable. Jejunum is intimately associated with a heterogeneous mass in the left abdomen, measuring 9.3 x 9.8 cm (2/34). Small bowel, appendix and colon are otherwise unremarkable. Vascular/Lymphatic: Atherosclerotic calcification of the aorta without aneurysm. IVC filter is in place. Abdominal peritoneal ligament and retroperitoneal lymph nodes are subcentimeter in short axis size. Small bowel mesenteric lymph nodes measure up to 1.6 cm (2/50). Reproductive: Multiple calcified fibroids in the uterus. No adnexal mass. Other: As partly discussed above, there is a large heterogeneous intraperitoneal mass in the left abdomen measuring 9.3 x 9.8 cm (2/34) with a large dilated draining vein. Flecks of internal calcification  are noted. Lesion is well-circumscribed and contacts multiple loops of small bowel. No free fluid. Mesenteries and peritoneum are otherwise unremarkable. Musculoskeletal: No worrisome lytic or sclerotic lesions. IMPRESSION: 1. 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 Lymphoma and small-bowel adenocarcinoma are considered less likely. Mass is considered high risk for hemorrhage with respect to percutaneous biopsy. Consider PET in further evaluation, as clinically indicated. These results will be called to the ordering clinician or representative by the Radiologist Assistant, and communication documented in the PACS or [employer]. 2. Aortic atherosclerosis (ICD10-I70.0). Coronary artery calcification. 3. 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 Electronically Signed   By: Lorin Picket M.D.   On: 02/15/2020 15:09   NM PET Image Initial (PI) Skull Base To Thigh  Result Date: 02/22/2020 CLINICAL DATA:  Initial  treatment strategy for GI stromal tumor of the small bowel. EXAM: NUCLEAR MEDICINE PET SKULL BASE TO THIGH TECHNIQUE: 9.7 mCi F-18 FDG was injected intravenously. Full-ring PET imaging was performed from the skull base to thigh after the radiotracer. CT data was obtained and used for attenuation correction and anatomic localization. Fasting blood glucose: 94 mg/dl COMPARISON:  02/15/2020 CT scan FINDINGS: Mediastinal blood pool activity: SUV max 2.9 Liver activity: SUV max NA NECK: No significant abnormal hypermetabolic activity in this region. Incidental CT findings: none CHEST: 1.0 cm right lower paratracheal lymph node on image 76 of series 3 is not appreciably hypermetabolic, maximum SUV 2.7 is less than blood pool. Similarly there is likely an AP window lymph node causing the slight convexity of the AP window, but no hypermetabolic activity to suggest malignant involvement. Incidental CT findings: Coronary, aortic arch, and branch vessel atherosclerotic vascular disease. Mild cardiomegaly. Centrilobular emphysema. Mild left lower lobe scarring along the hemidiaphragm. ABDOMEN/PELVIS: The 10.2 cm left upper quadrant mass has maximum SUV of 6.2, and heterogeneous internal activity with some suspected central necrosis. This mass is immediately adjacent to loops of jejunum and also proximate to the distal transverse colon. Incidental CT findings: Scattered colonic diverticulosis. Aortoiliac atherosclerotic vascular disease. Infrarenal IVC filter. Sigmoid colon diverticulosis. Calcified uterine fibroids. Small muscular lipomas are noted involving the left vastus intermedius and right hip adductor musculature. SKELETON: No significant abnormal hypermetabolic activity in this region. Incidental CT findings: Degenerative endplate sclerosis and degenerative loss of intervertebral disc height at L5-S1. IMPRESSION: 1. The dominant 10.2 cm left upper quadrant mass has a maximum SUV of 6.2 and notable internal  heterogeneity, appearance compatible with tissue diagnosis of GI stromal tumor. 2. No findings of distant metastatic spread are identified. 3. Borderline sized paratracheal and AP window lymph nodes are not hypermetabolic. 4. Other imaging findings of potential clinical significance: Aortic Atherosclerosis (ICD10-I70.0). Coronary atherosclerosis. Mild cardiomegaly. Emphysema (ICD10-J43.9). Mild scarring in the left lower lobe. Scattered colonic diverticulosis with more concentrated diverticulosis in the sigmoid colon. Infrarenal IVC filter. Calcified uterine fibroids. Degenerative disc disease at L5-S1. Electronically Signed   By: Van Clines M.D.   On: 02/22/2020 16:26   CT BIOPSY  Result Date: 03/01/2020 CLINICAL DATA:  Large left-sided abdominal mass suspicious for gastrointestinal stromal tumor imaging. EXAM: CT GUIDED CORE BIOPSY OF BODY PART ANESTHESIA/SEDATION: 2.0 mg IV Versed; 100 mcg IV Fentanyl Total Moderate Sedation Time:  20 minutes. The patient's level of consciousness and physiologic status were continuously monitored during the procedure by Radiology nursing. PROCEDURE: The procedure risks, benefits, and alternatives were explained to the patient. Questions regarding  the procedure were encouraged and answered. The patient understands and consents to the procedure. The left abdominal wall was prepped with chlorhexidine in a sterile fashion, and a sterile drape was applied covering the operative field. A sterile gown and sterile gloves were used for the procedure. Local anesthesia was provided with 1% Lidocaine. Time-out was performed prior to initiating the procedure. CT was performed in a supine position to localize a site for percutaneous biopsy. Under CT guidance, a 17 gauge trocar needle was advanced into the lateral aspect of a left-sided abdominal mass. Four separate coaxial 18 gauge core biopsy samples were obtained and submitted to pathology. A slurry of cut Gel-Foam pledgets  mixed in sterile saline was then injected through the outer needle as the needle was retracted and removed. Additional CT was performed. COMPLICATIONS: None FINDINGS: Large left-sided intra-abdominal mass measures approximately 10.3 cm in greatest diameter. Solid tissue was obtained. IMPRESSION: CT-guided core biopsy performed of 10 cm left abdominal mass. Electronically Signed   By: Aletta Edouard M.D.   On: 03/01/2020 16:15    GIST (gastrointestinal stromal tumor) of small bowel, malignant ([place]) # GIST of small bowel- STAGE II [low Mitotic index; T=9.8cm- T3; N0] [s/p IR biopsy].  I reviewed the pathology and stage with the patient and daughter in detail.  #Given the localized disease I would recommend evaluation with surgery; referral with Dr. Bary Castilla.  However they do understand that given her tenuous respiratory status-upfront surgery is prohibitive.  We will plan to proceed with neoadjuvant Gleevec 400 mg/day.  I reviewed the potential mechanism of action and side effects of Gleevec including but not limited to nausea vomiting diarrhea skin rash weight gain swelling in the legs.  Again reviewed the administration instructions.  #CHF/COPD home O2 4-6Lits;-borderline respiratory status- STABLE.   #Iron deficient anemia [[may 2021]-on saturation 3%; Ferritin-5.]- sec to small bowel GIST.  Today hemoglobin is 8.1.  Hold off PRBC transfusion.  Proceed with Venofer today.  Discussed that delivery should also help with the bleeding.  # DISPOSITION:.   # Venofer TODAY;  # referral to Dr.Byrnett- GIST # follow up in 2 weeks-MD; labs-cbc/cmp;venofer- Dr.B  Dr.Tate;Dr.Arnbruster.    # 40 minutes face-to-face with the patient discussing the above plan of care; more than 50% of time spent on prognosis/ natural history; counseling and coordination.   All questions were answered. The patient knows to call the clinic with any problems, questions or concerns.    Cammie Sickle, MD 03/12/2020  10:38 AM

## 2020-03-12 NOTE — Telephone Encounter (Signed)
Oral Oncology Patient Advocate Encounter  Prior Authorization for [place] (Imatinib) has been approved.    Imatinib Approval for 100 mg, #120 tablets, 4 tablets daily PA# [id number] Effective dates: 03/07/20 through 04/13/21  Imatinib Approval for 400 mg, #30 tablets, 1 tablet daily PA# [id number] Effective dates: 03/07/20-04/13/21  Patients co-pay is $0.00  Oral Oncology Clinic will continue to follow.   [place] Patient [place] Phone [phone] Fax [phone] 03/12/2020 2:53 PM,

## 2020-03-13 ENCOUNTER — Telehealth: Payer: Self-pay | Admitting: *Deleted

## 2020-03-13 ENCOUNTER — Encounter: Payer: Self-pay | Admitting: *Deleted

## 2020-03-13 ENCOUNTER — Telehealth: Payer: Self-pay | Admitting: Internal Medicine

## 2020-03-13 NOTE — Telephone Encounter (Signed)
On 11/29-discussed with Dr. Bary Castilla.  For now proceed with neoadjuvant Gleevec.  We will also await foundation 1.  Appointment with Dr. Tollie Pizza next week.  I left a voicemail for the patient's brother Susan Barajas.  [phone]

## 2020-03-13 NOTE — Telephone Encounter (Signed)
Foundation one submitted per Dr. Aletha Halim request

## 2020-03-15 ENCOUNTER — Other Ambulatory Visit: Payer: Medicare Other

## 2020-03-16 NOTE — Progress Notes (Signed)
Tumor Board Documentation  Susan Barajas was presented by Dr Rogue Bussing at our Tumor Board on 03/15/2020, which included representatives from medical oncology, radiation oncology, internal medicine, navigation, pathology, radiology, surgical, pharmacy, genetics, nutrition, research, palliative care, pulmonology.  Susan currently presents as a current patient, for [place], for new positive pathology with history of the following treatments: active survellience, surgical intervention(s).  Additionally, we reviewed previous medical and familial history, history of present illness, and recent lab results along with all available histopathologic and imaging studies. The tumor board considered available treatment options and made the following recommendations: Surgery    The following procedures/referrals were also placed: No orders of the defined types were placed in this encounter.   Clinical Trial Status: not discussed   Staging used: AJCC Stage Group  AJCC Staging: T: 3 N: 1   Group: Grade 1 Stage II GIST   National site-specific guidelines NCCN were discussed with respect to the case.  Tumor board is a meeting of clinicians from various specialty areas who evaluate and discuss patients for whom a multidisciplinary approach is being considered. Final determinations in the plan of care are those of the provider(s). The responsibility for follow up of recommendations given during tumor board is that of the provider.   Todays extended care, comprehensive team conference, Susan was not present for the discussion and was not examined.   Multidisciplinary Tumor Board is a multidisciplinary case peer review process.  Decisions discussed in the Multidisciplinary Tumor Board reflect the opinions of the specialists present at the conference without having examined the patient.  Ultimately, treatment and diagnostic decisions rest with the primary provider(s) and the patient.

## 2020-03-26 ENCOUNTER — Inpatient Hospital Stay: Payer: Medicare Other | Attending: Internal Medicine

## 2020-03-26 ENCOUNTER — Encounter: Payer: Self-pay | Admitting: Internal Medicine

## 2020-03-26 ENCOUNTER — Other Ambulatory Visit: Payer: Self-pay

## 2020-03-26 ENCOUNTER — Inpatient Hospital Stay: Payer: Medicare Other | Admitting: Pharmacist

## 2020-03-26 ENCOUNTER — Inpatient Hospital Stay: Payer: Medicare Other

## 2020-03-26 ENCOUNTER — Inpatient Hospital Stay: Payer: Medicare Other | Admitting: Internal Medicine

## 2020-03-26 VITALS — BP 111/51 | HR 61

## 2020-03-26 DIAGNOSIS — D509 Iron deficiency anemia, unspecified: Secondary | ICD-10-CM | POA: Diagnosis not present

## 2020-03-26 DIAGNOSIS — Z794 Long term (current) use of insulin: Secondary | ICD-10-CM | POA: Diagnosis not present

## 2020-03-26 DIAGNOSIS — I10 Essential (primary) hypertension: Secondary | ICD-10-CM | POA: Insufficient documentation

## 2020-03-26 DIAGNOSIS — I509 Heart failure, unspecified: Secondary | ICD-10-CM | POA: Insufficient documentation

## 2020-03-26 DIAGNOSIS — J449 Chronic obstructive pulmonary disease, unspecified: Secondary | ICD-10-CM | POA: Insufficient documentation

## 2020-03-26 DIAGNOSIS — E119 Type 2 diabetes mellitus without complications: Secondary | ICD-10-CM | POA: Insufficient documentation

## 2020-03-26 DIAGNOSIS — C49A3 Gastrointestinal stromal tumor of small intestine: Secondary | ICD-10-CM | POA: Diagnosis not present

## 2020-03-26 DIAGNOSIS — Z79899 Other long term (current) drug therapy: Secondary | ICD-10-CM | POA: Insufficient documentation

## 2020-03-26 DIAGNOSIS — E611 Iron deficiency: Secondary | ICD-10-CM

## 2020-03-26 DIAGNOSIS — Z7982 Long term (current) use of aspirin: Secondary | ICD-10-CM | POA: Diagnosis not present

## 2020-03-26 DIAGNOSIS — Z87891 Personal history of nicotine dependence: Secondary | ICD-10-CM | POA: Insufficient documentation

## 2020-03-26 DIAGNOSIS — E785 Hyperlipidemia, unspecified: Secondary | ICD-10-CM | POA: Diagnosis not present

## 2020-03-26 DIAGNOSIS — E669 Obesity, unspecified: Secondary | ICD-10-CM | POA: Diagnosis not present

## 2020-03-26 LAB — COMPREHENSIVE METABOLIC PANEL
ALT: 12 U/L (ref 0–44)
AST: 15 U/L (ref 15–41)
Albumin: 3.7 g/dL (ref 3.5–5.0)
Alkaline Phosphatase: 48 U/L (ref 38–126)
Anion gap: 7 (ref 5–15)
BUN: 20 mg/dL (ref 8–23)
CO2: 25 mmol/L (ref 22–32)
Calcium: 8.7 mg/dL — ABNORMAL LOW (ref 8.9–10.3)
Chloride: 105 mmol/L (ref 98–111)
Creatinine, Ser: 0.73 mg/dL (ref 0.44–1.00)
GFR, Estimated: >60 mL/min (ref >60)
Glucose, Bld: 64 mg/dL — ABNORMAL LOW (ref 70–99)
Potassium: 4.2 mmol/L (ref 3.5–5.1)
Sodium: 137 mmol/L (ref 135–145)
Total Bilirubin: 0.6 mg/dL (ref 0.3–1.2)
Total Protein: 6.6 g/dL (ref 6.5–8.1)

## 2020-03-26 LAB — CBC WITH DIFFERENTIAL/PLATELET
Abs Immature Granulocytes: 0.02 10*3/uL (ref 0.00–0.07)
Basophils Absolute: 0 10*3/uL (ref 0.0–0.1)
Basophils Relative: 1 %
Eosinophils Absolute: 0 10*3/uL (ref 0.0–0.5)
Eosinophils Relative: 0 %
HCT: 31.9 % — ABNORMAL LOW (ref 36.0–46.0)
Hemoglobin: 9.2 g/dL — ABNORMAL LOW (ref 12.0–15.0)
Immature Granulocytes: 0 %
Lymphocytes Relative: 9 %
Lymphs Abs: 0.4 10*3/uL — ABNORMAL LOW (ref 0.7–4.0)
MCH: 28.7 pg (ref 26.0–34.0)
MCHC: 28.8 g/dL — ABNORMAL LOW (ref 30.0–36.0)
MCV: 99.4 fL (ref 80.0–100.0)
Monocytes Absolute: 0.4 10*3/uL (ref 0.1–1.0)
Monocytes Relative: 7 %
Neutro Abs: 4.1 10*3/uL (ref 1.7–7.7)
Neutrophils Relative %: 83 %
Platelets: 260 10*3/uL (ref 150–400)
RBC: 3.21 MIL/uL — ABNORMAL LOW (ref 3.87–5.11)
RDW: 16 % — ABNORMAL HIGH (ref 11.5–15.5)
WBC: 4.9 10*3/uL (ref 4.0–10.5)
nRBC: 0 % (ref 0.0–0.2)

## 2020-03-26 LAB — SAMPLE TO BLOOD BANK

## 2020-03-26 MED ORDER — IRON SUCROSE 20 MG/ML IV SOLN
200.0000 mg | Freq: Once | INTRAVENOUS | Status: AC
  Administered 2020-03-26: 12:00:00 200 mg via INTRAVENOUS
  Filled 2020-03-26: qty 10

## 2020-03-26 MED ORDER — SODIUM CHLORIDE 0.9 % IV SOLN
Freq: Once | INTRAVENOUS | Status: AC
  Filled 2020-03-26: qty 250

## 2020-03-26 NOTE — Progress Notes (Signed)
Pt tolerated Venofer infusion well with no signs of complications. VSS. Pt stable for discharge. Pt discharged home.   Creighton Longley [employer]

## 2020-03-26 NOTE — Progress Notes (Signed)
[place] NOTE  Patient Care Team: Albina Billet, MD as PCP - General (Unknown Physician Specialty) Wellington Hampshire, MD as PCP - Cardiology (Cardiology) Christene Lye, MD (General Surgery) Albina Billet, MD (Internal Medicine) Delana Meyer, Dolores Lory, MD (Vascular Surgery) Wilford Corner, MD as Consulting Physician (Gastroenterology) Cammie Sickle, MD as Consulting Physician (Hematology and Oncology)  CHIEF COMPLAINTS/PURPOSE OF CONSULTATION: Anemia   HEMATOLOGY HISTORY  Oncology History Overview Note  NOV 2021- CT Bx- TUMOR  Tumor Site: Peritoneum, jejunal  Histologic Type: Gastrointestinal stromal tumor, spindle cell type  Mitotic Rate: 1 per 5 mm squared  Histologic Grade: G1, low grade (mitotic rate less than or equal to 5  per 5 mm squared)- STAGE T2N1 [possible mesneteric LN]; CT A/P-9.8 x9.8 cm  # NOV 29th 2021- GLEEVEC 400 mg/day.     # CHRONIC ANEMIA EGD/capsule-WNL-2018 [Dr.vanga]-UGIB ; colonoscopy-2018 [Elliot] ; capsule-not done; hemoglobin around 8.8; iron saturation 5% [Dr.Tate].  Poor tolerance of p.o. iron; [Dr.Armbruster; [facility] July 2021]-high risk of anesthesia; on IV venofer  # CHF/[Dr.Mclaheny]; COPD [Dr.Kasa]- on Home 4-6lit/ O2  # SURVIVORSHIP:   # GENETICS:   DIAGNOSIS: GIST  STAGE: T3 N0 vs  T3N1      ;  GOALS: curative Vs control  CURRENT/MOST RECENT THERAPY : neoadjuant Gleevec   .   GIST (gastrointestinal stromal tumor) of small bowel, malignant ([place])  03/12/2020 Initial Diagnosis   GIST (gastrointestinal stromal tumor) of small bowel, malignant (H[facility])      HISTORY OF PRESENTING ILLNESS:  Susan Barajas 74 y.o.  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  2 weeks ago patient started on Gleevec 400 mg once a day.  She is tolerating well.  No diarrhea.  No skin rash.   Swelling the legs.  No nausea no vomiting.  Denies any blood in stools or black or stools.  In the interim patient also been evaluated by Dr. Bary Castilla.   Review of Systems  Constitutional: Positive for malaise/fatigue. Negative for chills, diaphoresis, fever and weight loss.  HENT: Negative for nosebleeds and sore throat.   Eyes: Negative for double vision.  Respiratory: Positive for shortness of breath. Negative for cough, hemoptysis, sputum production and wheezing.   Cardiovascular: Negative for chest pain, palpitations, orthopnea and leg swelling.  Gastrointestinal: Negative for abdominal pain, blood in stool, constipation, diarrhea, heartburn, melena, nausea and vomiting.  Genitourinary: Negative for dysuria, frequency and urgency.  Musculoskeletal: Negative for back pain and joint pain.  Skin: Negative.  Negative for itching and rash.  Neurological: Negative for dizziness, tingling, focal weakness, weakness and headaches.  Endo/Heme/Allergies: Does not bruise/bleed easily.  Psychiatric/Behavioral: Negative for depression. The patient is not nervous/anxious and does not have insomnia.     MEDICAL HISTORY:  Past Medical History:  Diagnosis Date  . Asthma 1950  . Clotting disorder (HCC)    right leg  . 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 GIST (gastrointestinal stromal tumor) of small bowel, malignant ([place])   . Glaucoma 1949  . Hyperlipidemia   . Hypertension   . Obesity, unspecified   . Personal history of tobacco use, presenting hazards to health   . Varicose veins of lower extremities with other complications     SURGICAL HISTORY: Past Surgical History:  Procedure Laterality Date  . BREAST BIOPSY Right  1991  . BREAST BIOPSY Left 2013  . COLONOSCOPY  2010   Dr. Vira Agar  . COLONOSCOPY  Jan 2016   Dr Vira Agar  . ESOPHAGOGASTRODUODENOSCOPY N/A 11/26/2016   Procedure: ESOPHAGOGASTRODUODENOSCOPY (EGD);  Surgeon: Lin Landsman, MD;  Location: [facility]  ENDOSCOPY;  Service: Gastroenterology;  Laterality: N/A;  . GIVENS CAPSULE STUDY N/A 11/26/2016   Procedure: GIVENS CAPSULE STUDY, if EGD negative, plan to drop capsule with scope if EGD negative;  Surgeon: Lin Landsman, MD;  Location: [facility] ENDOSCOPY;  Service: Gastroenterology;  Laterality: N/A;  . IVC FILTER INSERTION N/A 11/14/2016   Procedure: IVC Filter Insertion;  Surgeon: Katha Cabal, MD;  Location: [place] CV LAB;  Service: Cardiovascular;  Laterality: N/A;  . RIGHT/LEFT HEART CATH AND CORONARY ANGIOGRAPHY N/A 06/30/2019   Procedure: RIGHT/LEFT HEART CATH AND CORONARY ANGIOGRAPHY;  Surgeon: Larey Dresser, MD;  Location: [place] CV LAB;  Service: Cardiovascular;  Laterality: N/A;  . VEIN SURGERY Right 2006   Vein Closure Procedure; RF ablation of right GSV    SOCIAL HISTORY: Social History   Socioeconomic History  . Marital status: Single    Spouse name: Not on file  . Number of children: 2  . Years of education: Not on file  . Highest education level: Not on file  Occupational History  . Occupation: CELL    Employer: RETIRED  Tobacco Use  . Smoking status: Former Smoker    Packs/day: 1.50    Years: 20.00    Pack years: 30.00    Quit date: 1999    Years since quitting: 22.9  . Smokeless tobacco: Never Used  Vaping Use  . Vaping Use: Never used  Substance and Sexual Activity  . Alcohol use: No    Alcohol/week: 0.0 standard drinks  . Drug use: No  . Sexual activity: Not Currently  Other Topics Concern  . Not on file  Social History Narrative   Independent at baseline. Lives at home with her brother; in [place]. Quit smoking 20 years; no alcohol.    Social Determinants of Health   Financial Resource Strain: Not on file  Food Insecurity: Not on file  Transportation Needs: Not on file  Physical Activity: Not on file  Stress: Not on file  Social Connections: Not on file  Intimate Partner Violence: Not on file    FAMILY HISTORY: Family  History  Problem Relation Age of Onset  . Prostate cancer Father   . Other Sister        mouth cancer  . Hypertension Mother     ALLERGIES:  has No Known Allergies.  MEDICATIONS:  Current Outpatient Medications  Medication Sig Dispense Refill  . ACCU-CHEK AVIVA PLUS test strip 1 each 2 (two) times daily.    Marland Kitchen acetaminophen (TYLENOL) 325 MG tablet Take 2 tablets (650 mg total) by mouth every 6 (six) hours as needed for mild pain (or Fever >/= 101).    Marland Kitchen aspirin 81 MG chewable tablet Chew 1 tablet (81 mg total) by mouth daily. 30 tablet 0  . FARXIGA 10 MG TABS tablet TAKE 1 TABLET BY MOUTH ONCE DAILY BEFORE BREAKFAST 90 tablet 0  . ferrous gluconate (FERGON) 325 MG tablet Take 325 mg by mouth in the morning and at bedtime.     . furosemide (LASIX) 40 MG tablet Take 1 tablet (40 mg total) by mouth in the morning AND 0.5 tablets (20 mg total) every evening. 45 tablet 3  . HUMALOG KWIKPEN 100 UNIT/ML KiwkPen Inject  10 Units into the skin 2 (two) times daily.     Marland Kitchen imatinib (GLEEVEC) 100 MG tablet Take 4 tablets (400 mg total) by mouth daily. Take with meals and large glass of water 120 tablet 1  . insulin glargine (LANTUS) 100 UNIT/ML injection Inject 32 Units into the skin daily.     Marland Kitchen latanoprost (XALATAN) 0.005 % ophthalmic solution Place 1 drop into both eyes at bedtime.     . lovastatin (MEVACOR) 20 MG tablet Take 20 mg by mouth every evening.     . macitentan (OPSUMIT) 10 MG tablet Take 1 tablet (10 mg total) by mouth daily. 30 tablet 11  . metFORMIN (GLUCOPHAGE) 1000 MG tablet Take 1,000 mg by mouth daily.     . Multiple Vitamin (MULTIVITAMIN) tablet Take 1 tablet by mouth daily.    Marland Kitchen omeprazole (PRILOSEC) 40 MG capsule Take 40 mg by mouth daily.    . ondansetron (ZOFRAN) 8 MG tablet One pill every 8 hours as needed for nausea/vomitting. 45 tablet 1  . potassium chloride SA (KLOR-CON) 20 MEQ tablet Take 1 tablet (20 mEq total) by mouth daily. 90 tablet 3  . tadalafil, PAH, (ALYQ) 20  MG tablet Take 2 tablets (40 mg total) by mouth daily. 60 tablet 11  . timolol (TIMOPTIC) 0.25 % ophthalmic solution Place 1 drop into both eyes 2 (two) times daily.     . Tiotropium Bromide-Olodaterol (STIOLTO RESPIMAT) 2.5-2.5 MCG/ACT AERS Inhale 2 puffs into the lungs daily. 1 g 5   No current facility-administered medications for this visit.      PHYSICAL EXAMINATION:   Vitals:   03/26/20 1102  BP: (!) 113/51  Pulse: (!) 51  Temp: (!) 97.5 F (36.4 C)   Filed Weights   03/26/20 1102  Weight: 194 lb 3.2 oz (88.1 kg)    Physical Exam Constitutional:      Comments: 6 L of oxygen nasal cannula.  Walk independently. She is alone.  HENT:     Head: Normocephalic and atraumatic.     Mouth/Throat:     Pharynx: No oropharyngeal exudate.  Eyes:     Pupils: Pupils are equal, round, and reactive to light.  Cardiovascular:     Rate and Rhythm: Normal rate and regular rhythm.  Pulmonary:     Effort: No respiratory distress.     Breath sounds: No wheezing.     Comments: Decreased breath sounds. Abdominal:     General: Bowel sounds are normal. There is no distension.     Palpations: Abdomen is soft. There is no mass.     Tenderness: There is no abdominal tenderness. There is no guarding or rebound.  Musculoskeletal:        General: No tenderness. Normal range of motion.     Cervical back: Normal range of motion and neck supple.  Skin:    General: Skin is warm.  Neurological:     Mental Status: She is alert and oriented to person, place, and time.  Psychiatric:        Mood and Affect: Affect normal.     LABORATORY DATA:  I have reviewed the data as listed Lab Results  Component Value Date   WBC 4.9 03/26/2020   HGB 9.2 (L) 03/26/2020   HCT 31.9 (L) 03/26/2020   MCV 99.4 03/26/2020   PLT 260 03/26/2020   Recent Labs    08/15/19 1450 08/23/19 1618 10/28/19 1213 02/10/20 1246 03/12/20 0856 03/26/20 1006  NA 136 139 138 137 138 137  K 4.3 4.3 4.2 4.0 4.1 4.2   CL 102 103 107 103 104 105  CO2 _0 GLUCOSE 202* 140* 103* 155* 197* 64*  BUN _1 25* 22 20  CREATININE 0.95 0.88 0.78 0.74 0.57 0.73  CALCIUM 8.6* 9.2 8.6* 8.8* 8.7* 8.7*  GFRNONAA 59* >60 >60 >60 >60 >60  GFRAA >60 >60 >60  --   --   --   PROT 6.8  --   --   --  6.6 6.6  ALBUMIN 3.7  --   --   --  3.5 3.7  AST 17  --   --   --  15 15  ALT 13  --   --   --  12 12  ALKPHOS 51  --   --   --  46 48  BILITOT 0.5  --   --   --  0.3 0.6     CT BIOPSY  Result Date: 03/01/2020 CLINICAL DATA:  Large left-sided abdominal mass suspicious for gastrointestinal stromal tumor imaging. EXAM: CT GUIDED CORE BIOPSY OF BODY PART ANESTHESIA/SEDATION: 2.0 mg IV Versed; 100 mcg IV Fentanyl Total Moderate Sedation Time:  20 minutes. The patient's level of consciousness and physiologic status were continuously monitored during the procedure by Radiology nursing. PROCEDURE: The procedure risks, benefits, and alternatives were explained to the patient. Questions regarding the procedure were encouraged and answered. The patient understands and consents to the procedure. The left abdominal wall was prepped with chlorhexidine in a sterile fashion, and a sterile drape was applied covering the operative field. A sterile gown and sterile gloves were used for the procedure. Local anesthesia was provided with 1% Lidocaine. Time-out was performed prior to initiating the procedure. CT was performed in a supine position to localize a site for percutaneous biopsy. Under CT guidance, a 17 gauge trocar needle was advanced into the lateral aspect of a left-sided abdominal mass. Four separate coaxial 18 gauge core biopsy samples were obtained and submitted to pathology. A slurry of cut Gel-Foam pledgets mixed in sterile saline was then injected through the outer needle as the needle was retracted and removed. Additional CT was performed. COMPLICATIONS: None FINDINGS: Large left-sided intra-abdominal mass measures  approximately 10.3 cm in greatest diameter. Solid tissue was obtained. IMPRESSION: CT-guided core biopsy performed of 10 cm left abdominal mass. Electronically Signed   By: Aletta Edouard M.D.   On: 03/01/2020 16:15    GIST (gastrointestinal stromal tumor) of small bowel, malignant ([place]) # GIST of small bowel-low grade STAGE II [T3N0] vs  IV [N1] [low Mitotic index; T=9.8cm- T3; N1- ? mesneteric LN] [s/p IR biopsy]. Localized disease; ? NGS- pending.   #Patient on Gleevec 400 mg once a day tolerating well [dose might need to be titrated up based upon results of NGS].  Continue the current dose at this time.  Patient met with Ebony Hail in pharmacy.  #CHF/COPD home O2 4-6Lits;-borderline respiratory status- STABLE.   #Iron deficient anemia [[may 2021]-on saturation 3%; Ferritin-5.]- sec to small bowel GIST.  Today hemoglobin is  9.2; .  Hold off PRBC transfusion.  Proceed with Venofer today.  # DISPOSITION:.   # Venofer TODAY;  # follow up in 3 weeks-X-MD; labs-cbc/cmp;venofer  # Follow up in 6 weeks-  MD; labs-cbc/cmp;venofer- Dr.B  Dr.Tate;Dr.Arnbruster.    All questions were answered. The patient knows to call the clinic with any problems, questions or concerns.    Cammie Sickle, MD 03/26/2020 1:23  PM   

## 2020-03-26 NOTE — Assessment & Plan Note (Addendum)
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 mesneteric LN] [s/p IR biopsy]. Localized disease; ? NGS- pending.   #Patient on Gleevec 400 mg once a day tolerating well [dose might need to be titrated up based upon results of NGS].  Continue the current dose at this time.  Patient met with Ebony Hail in pharmacy.  #CHF/COPD home O2 4-6Lits;-borderline respiratory status- STABLE.   #Iron deficient anemia [[may 2021]-on saturation 3%; Ferritin-5.]- sec to small bowel GIST.  Today hemoglobin is  9.2; .  Hold off PRBC transfusion.  Proceed with Venofer today.  # DISPOSITION:.   # Venofer TODAY;  # follow up in 3 weeks-X-MD; labs-cbc/cmp;venofer  # Follow up in 6 weeks-  MD; labs-cbc/cmp;venofer- Dr.B  Dr.Tate;Dr.Arnbruster.

## 2020-03-26 NOTE — Progress Notes (Signed)
[place]  Telephone:(336) [phone] Fax:(336) [phone]  Patient Care Team: Albina Billet, MD as PCP - General (Unknown Physician Specialty) Wellington Hampshire, MD as PCP - Cardiology (Cardiology) Christene Lye, MD (General Surgery) Albina Billet, MD (Internal Medicine) Schnier, Dolores Lory, MD (Vascular Surgery) Wilford Corner, MD as Consulting Physician (Gastroenterology) Cammie Sickle, MD as Consulting Physician (Hematology and Oncology)   Name of the patient: Susan Barajas  [id number]  07-28-1945   Date of visit: 03/26/20  HPI: Patient is a 74 y.o. female with GIST. She is being treated neo-adjuvantly with Gleevec (imatinib).   Reason for Consult: Oral chemotherapy follow-up for Gleevec (imatinib) therapy. Patient started treatment on 03/12/20. Patient's daughter is with her in clinic today.   PAST MEDICAL HISTORY: Past Medical History:  Diagnosis Date  . Asthma 1950  . Clotting disorder (HCC)    right leg  . Diabetes ([place]) Age 19   Type 2; insulin dependent  . GIST (gastrointestinal stromal tumor) of small bowel, malignant ([place])   . Glaucoma 1949  . Hyperlipidemia   . Hypertension   . Obesity, unspecified   . Personal history of tobacco use, presenting hazards to health   . Varicose veins of lower extremities with other complications     PAST SURGICAL HISTORY:  Past Surgical History:  Procedure Laterality Date  . BREAST BIOPSY Right 1991  . BREAST BIOPSY Left 2013  . COLONOSCOPY  2010   Dr. Vira Agar  . COLONOSCOPY  Jan 2016   Dr Vira Agar  . ESOPHAGOGASTRODUODENOSCOPY N/A 11/26/2016   Procedure: ESOPHAGOGASTRODUODENOSCOPY (EGD);  Surgeon: Lin Landsman, MD;  Location: [facility] ENDOSCOPY;  Service: Gastroenterology;  Laterality: N/A;  . GIVENS CAPSULE STUDY N/A 11/26/2016   Procedure: GIVENS CAPSULE STUDY, if EGD negative, plan to drop capsule with scope if EGD negative;  Surgeon: Lin Landsman, MD;  Location[facility] ENDOSCOPY;  Service: Gastroenterology;  Laterality: N/A;  . IVC FILTER INSERTION N/A 11/14/2016   Procedure: IVC Filter Insertion;  Surgeon: Katha Cabal, MD;  Location: [place] CV LAB;  Service: Cardiovascular;  Laterality: N/A;  . RIGHT/LEFT HEART CATH AND CORONARY ANGIOGRAPHY N/A 06/30/2019   Procedure: RIGHT/LEFT HEART CATH AND CORONARY ANGIOGRAPHY;  Surgeon: Larey Dresser, MD;  Location: [place] CV LAB;  Service: Cardiovascular;  Laterality: N/A;  . VEIN SURGERY Right 2006   Vein Closure Procedure; RF ablation of right GSV    HEMATOLOGY/ONCOLOGY HISTORY:  Oncology History Overview Note  NOV 2021- CT Bx- TUMOR  Tumor Site: Peritoneum, jejunal  Histologic Type: Gastrointestinal stromal tumor, spindle cell type  Mitotic Rate: 1 per 5 mm squared  Histologic Grade: G1, low grade (mitotic rate less than or equal to 5  per 5 mm squared)- STAGE T2N1 [possible mesneteric LN]; CT A/P-9.8 x9.8 cm  # NOV 29th 2021- GLEEVEC 400 mg/day.     # CHRONIC ANEMIA EGD/capsule-WNL-2018 [Dr.vanga]-UGIB ; colonoscopy-2018 [Elliot] ; capsule-not done; hemoglobin around 8.8; iron saturation 5% [Dr.Tate].  Poor tolerance of p.o. iron; [Dr.Armbruster; [facility] July 2021]-high risk of anesthesia; on IV venofer  # CHF/[Dr.Mclaheny]; COPD [Dr.Kasa]- on Home 4-6lit/ O2  # SURVIVORSHIP:   # GENETICS:   DIAGNOSIS: GIST  STAGE: T3 N0 vs  T3N1      ;  GOALS: curative Vs control  CURRENT/MOST RECENT THERAPY : neoadjuant Gleevec   .   GIST (gastrointestinal stromal tumor) of small bowel, malignant ([place])  03/12/2020 Initial Diagnosis   GIST (gastrointestinal stromal  tumor) of small bowel, malignant ([place])     ALLERGIES:  has No Known Allergies.  MEDICATIONS:  Current Outpatient Medications  Medication Sig Dispense Refill  . ACCU-CHEK AVIVA PLUS test strip 1 each 2 (two) times daily.    Marland Kitchen acetaminophen (TYLENOL) 325 MG tablet Take 2 tablets (650 mg total)  by mouth every 6 (six) hours as needed for mild pain (or Fever >/= 101).    Marland Kitchen aspirin 81 MG chewable tablet Chew 1 tablet (81 mg total) by mouth daily. 30 tablet 0  . FARXIGA 10 MG TABS tablet TAKE 1 TABLET BY MOUTH ONCE DAILY BEFORE BREAKFAST 90 tablet 0  . ferrous gluconate (FERGON) 325 MG tablet Take 325 mg by mouth in the morning and at bedtime.     . furosemide (LASIX) 40 MG tablet Take 1 tablet (40 mg total) by mouth in the morning AND 0.5 tablets (20 mg total) every evening. 45 tablet 3  . HUMALOG KWIKPEN 100 UNIT/ML KiwkPen Inject 10 Units into the skin 2 (two) times daily.     Marland Kitchen imatinib (GLEEVEC) 100 MG tablet Take 4 tablets (400 mg total) by mouth daily. Take with meals and large glass of water 120 tablet 1  . insulin glargine (LANTUS) 100 UNIT/ML injection Inject 32 Units into the skin daily.     Marland Kitchen latanoprost (XALATAN) 0.005 % ophthalmic solution Place 1 drop into both eyes at bedtime.     . lovastatin (MEVACOR) 20 MG tablet Take 20 mg by mouth every evening.     . macitentan (OPSUMIT) 10 MG tablet Take 1 tablet (10 mg total) by mouth daily. 30 tablet 11  . metFORMIN (GLUCOPHAGE) 1000 MG tablet Take 1,000 mg by mouth daily.     . Multiple Vitamin (MULTIVITAMIN) tablet Take 1 tablet by mouth daily.    Marland Kitchen omeprazole (PRILOSEC) 40 MG capsule Take 40 mg by mouth daily.    . ondansetron (ZOFRAN) 8 MG tablet One pill every 8 hours as needed for nausea/vomitting. 45 tablet 1  . potassium chloride SA (KLOR-CON) 20 MEQ tablet Take 1 tablet (20 mEq total) by mouth daily. 90 tablet 3  . tadalafil, PAH, (ALYQ) 20 MG tablet Take 2 tablets (40 mg total) by mouth daily. 60 tablet 11  . timolol (TIMOPTIC) 0.25 % ophthalmic solution Place 1 drop into both eyes 2 (two) times daily.     . Tiotropium Bromide-Olodaterol (STIOLTO RESPIMAT) 2.5-2.5 MCG/ACT AERS Inhale 2 puffs into the lungs daily. 1 g 5   No current facility-administered medications for this visit.    VITAL SIGNS: There were no vitals  taken for this visit. There were no vitals filed for this visit.  Estimated body mass index is 32.32 kg/m as calculated from the following:   Height as of 03/06/20: _0  (1.651 m).   Weight as of an earlier encounter on 03/26/20: 88.1 kg (194 lb 3.2 oz).  LABS: CBC:    Component Value Date/Time   WBC 4.9 03/26/2020 1006   HGB 9.2 (L) 03/26/2020 1006   HCT 31.9 (L) 03/26/2020 1006   HCT 27.1 (L) 11/25/2016 2153   PLT 260 03/26/2020 1006   MCV 99.4 03/26/2020 1006   NEUTROABS 4.1 03/26/2020 1006   LYMPHSABS 0.4 (L) 03/26/2020 1006   MONOABS 0.4 03/26/2020 1006   EOSABS 0.0 03/26/2020 1006   BASOSABS 0.0 03/26/2020 1006   Comprehensive Metabolic Panel:    Component Value Date/Time   NA 137 03/26/2020 1006   K 4.2 03/26/2020 1006  CL 105 03/26/2020 1006   CO2 25 03/26/2020 1006   BUN 20 03/26/2020 1006   CREATININE 0.73 03/26/2020 1006   GLUCOSE 64 (L) 03/26/2020 1006   CALCIUM 8.7 (L) 03/26/2020 1006   AST 15 03/26/2020 1006   ALT 12 03/26/2020 1006   ALKPHOS 48 03/26/2020 1006   BILITOT 0.6 03/26/2020 1006   PROT 6.6 03/26/2020 1006   ALBUMIN 3.7 03/26/2020 1006    RADIOGRAPHIC STUDIES: CT BIOPSY  Result Date: 03/27/20 CLINICAL DATA:  Large left-sided abdominal mass suspicious for gastrointestinal stromal tumor imaging. EXAM: CT GUIDED CORE BIOPSY OF BODY PART ANESTHESIA/SEDATION: 2.0 mg IV Versed; 100 mcg IV Fentanyl Total Moderate Sedation Time:  20 minutes. The patient's level of consciousness and physiologic status were continuously monitored during the procedure by Radiology nursing. PROCEDURE: The procedure risks, benefits, and alternatives were explained to the patient. Questions regarding the procedure were encouraged and answered. The patient understands and consents to the procedure. The left abdominal wall was prepped with chlorhexidine in a sterile fashion, and a sterile drape was applied covering the operative field. A sterile gown and sterile gloves were  used for the procedure. Local anesthesia was provided with 1% Lidocaine. Time-out was performed prior to initiating the procedure. CT was performed in a supine position to localize a site for percutaneous biopsy. Under CT guidance, a 17 gauge trocar needle was advanced into the lateral aspect of a left-sided abdominal mass. Four separate coaxial 18 gauge core biopsy samples were obtained and submitted to pathology. A slurry of cut Gel-Foam pledgets mixed in sterile saline was then injected through the outer needle as the needle was retracted and removed. Additional CT was performed. COMPLICATIONS: None FINDINGS: Large left-sided intra-abdominal mass measures approximately 10.3 cm in greatest diameter. Solid tissue was obtained. IMPRESSION: CT-guided core biopsy performed of 10 cm left abdominal mass. Electronically Signed   By: Aletta Edouard M.D.   On: 03/27/20 16:15     Assessment and Plan-  Continue imatinib 426m daily   Oral Chemotherapy Side Effect/Intolerance:  -Reviewed potential side effect (e.g. edema, rash, diarrhea, nausea, fatigue) no issues reported  Oral Chemotherapy Adherence: no missed doses reported  Medication Access Issues: no issues, patient fills at [facility] they are due to give her a call on 12/20 to set her up for her refill. She has a prescription refill available.  No patient barriers to medication adherence identified.   Patient expressed understanding and was in agreement with this plan. She also understands that She can call clinic at any time with any questions, concerns, or complaints.   Thank you for allowing me to participate in the care of this very pleasant patient.   Time Total: 10 mins  Visit consisted of counseling and education on dealing with issues of symptom management in the setting of serious and potentially life-threatening illness.Greater than 50%  of this time was spent counseling and coordinating care related to the above  assessment and plan.  Signed by: ADarl Pikes PharmD, BCPS, BSalley Slaughter CPP Hematology/Oncology Clinical Pharmacist Practitioner [phone] 03/26/2020 3:26 PM

## 2020-03-27 ENCOUNTER — Telehealth (HOSPITAL_COMMUNITY): Payer: Self-pay | Admitting: Pharmacist

## 2020-03-27 NOTE — Telephone Encounter (Signed)
Advanced Heart Failure Patient Advocate Encounter  Prior Authorization for [employer] has been approved.    PA# [id number] Effective dates: 04/14/20 through 04/13/21  Audry Riles, PharmD, BCPS, BCCP, CPP Heart Failure Clinic Pharmacist [phone]

## 2020-03-27 NOTE — Telephone Encounter (Signed)
Patient Advocate Encounter   Received notification from [facility] that prior authorization for tadalafil and Opsumit are required.   PA submitted on CoverMyMeds Key [id number] (tadalafil), [id number] (opsumit) Status is pending   Will continue to follow.  Audry Riles, PharmD, BCPS, BCCP, CPP Heart Failure Clinic Pharmacist [phone]

## 2020-03-29 ENCOUNTER — Encounter: Payer: Self-pay | Admitting: Internal Medicine

## 2020-03-29 NOTE — Telephone Encounter (Signed)
Advanced Heart Failure Patient Advocate Encounter  Prior Authorization for tadalafil has been approved.    PA# [id number] Effective dates: 04/14/20 through 04/13/21  Audry Riles, PharmD, BCPS, BCCP, CPP Heart Failure Clinic Pharmacist [phone]

## 2020-04-03 ENCOUNTER — Encounter: Payer: Self-pay | Admitting: Internal Medicine

## 2020-04-09 MED FILL — IMATINIB MESYLATE 100 MG TA: 100 | 30 days supply | Qty: 120 | Fill #1

## 2020-04-16 ENCOUNTER — Inpatient Hospital Stay: Payer: Medicare Other | Admitting: Oncology

## 2020-04-16 ENCOUNTER — Other Ambulatory Visit: Payer: Self-pay

## 2020-04-16 ENCOUNTER — Encounter: Payer: Self-pay | Admitting: Oncology

## 2020-04-16 ENCOUNTER — Other Ambulatory Visit (HOSPITAL_COMMUNITY): Payer: Self-pay | Admitting: Internal Medicine

## 2020-04-16 ENCOUNTER — Inpatient Hospital Stay: Payer: Medicare Other

## 2020-04-16 ENCOUNTER — Inpatient Hospital Stay: Payer: Medicare Other | Attending: Oncology

## 2020-04-16 VITALS — BP 107/65 | HR 66

## 2020-04-16 VITALS — BP 116/46 | HR 73 | Temp 97.4°F | Resp 16 | Wt 197.6 lb

## 2020-04-16 DIAGNOSIS — I10 Essential (primary) hypertension: Secondary | ICD-10-CM | POA: Diagnosis not present

## 2020-04-16 DIAGNOSIS — J449 Chronic obstructive pulmonary disease, unspecified: Secondary | ICD-10-CM | POA: Insufficient documentation

## 2020-04-16 DIAGNOSIS — I509 Heart failure, unspecified: Secondary | ICD-10-CM | POA: Insufficient documentation

## 2020-04-16 DIAGNOSIS — D509 Iron deficiency anemia, unspecified: Secondary | ICD-10-CM | POA: Insufficient documentation

## 2020-04-16 DIAGNOSIS — E119 Type 2 diabetes mellitus without complications: Secondary | ICD-10-CM | POA: Diagnosis not present

## 2020-04-16 DIAGNOSIS — Z7982 Long term (current) use of aspirin: Secondary | ICD-10-CM | POA: Diagnosis not present

## 2020-04-16 DIAGNOSIS — Z79899 Other long term (current) drug therapy: Secondary | ICD-10-CM | POA: Insufficient documentation

## 2020-04-16 DIAGNOSIS — E785 Hyperlipidemia, unspecified: Secondary | ICD-10-CM | POA: Diagnosis not present

## 2020-04-16 DIAGNOSIS — C49A0 Gastrointestinal stromal tumor, unspecified site: Secondary | ICD-10-CM | POA: Insufficient documentation

## 2020-04-16 DIAGNOSIS — E669 Obesity, unspecified: Secondary | ICD-10-CM | POA: Diagnosis not present

## 2020-04-16 DIAGNOSIS — F17210 Nicotine dependence, cigarettes, uncomplicated: Secondary | ICD-10-CM | POA: Insufficient documentation

## 2020-04-16 DIAGNOSIS — C49A3 Gastrointestinal stromal tumor of small intestine: Secondary | ICD-10-CM | POA: Diagnosis not present

## 2020-04-16 DIAGNOSIS — J45909 Unspecified asthma, uncomplicated: Secondary | ICD-10-CM | POA: Insufficient documentation

## 2020-04-16 DIAGNOSIS — E611 Iron deficiency: Secondary | ICD-10-CM

## 2020-04-16 LAB — COMPREHENSIVE METABOLIC PANEL
ALT: 13 U/L (ref 0–44)
AST: 18 U/L (ref 15–41)
Albumin: 3.8 g/dL (ref 3.5–5.0)
Alkaline Phosphatase: 55 U/L (ref 38–126)
Anion gap: 9 (ref 5–15)
BUN: 24 mg/dL — ABNORMAL HIGH (ref 8–23)
CO2: 25 mmol/L (ref 22–32)
Calcium: 8.8 mg/dL — ABNORMAL LOW (ref 8.9–10.3)
Chloride: 106 mmol/L (ref 98–111)
Creatinine, Ser: 1 mg/dL (ref 0.44–1.00)
GFR, Estimated: 59 mL/min — ABNORMAL LOW (ref >60)
Glucose, Bld: 77 mg/dL (ref 70–99)
Potassium: 4.1 mmol/L (ref 3.5–5.1)
Sodium: 140 mmol/L (ref 135–145)
Total Bilirubin: 0.3 mg/dL (ref 0.3–1.2)
Total Protein: 6.7 g/dL (ref 6.5–8.1)

## 2020-04-16 LAB — CBC WITH DIFFERENTIAL/PLATELET
Abs Immature Granulocytes: 0.01 10*3/uL (ref 0.00–0.07)
Basophils Absolute: 0 10*3/uL (ref 0.0–0.1)
Basophils Relative: 1 %
Eosinophils Absolute: 0 10*3/uL (ref 0.0–0.5)
Eosinophils Relative: 0 %
HCT: 30.2 % — ABNORMAL LOW (ref 36.0–46.0)
Hemoglobin: 9.5 g/dL — ABNORMAL LOW (ref 12.0–15.0)
Immature Granulocytes: 0 %
Lymphocytes Relative: 9 %
Lymphs Abs: 0.5 10*3/uL — ABNORMAL LOW (ref 0.7–4.0)
MCH: 29.2 pg (ref 26.0–34.0)
MCHC: 31.5 g/dL (ref 30.0–36.0)
MCV: 92.9 fL (ref 80.0–100.0)
Monocytes Absolute: 0.4 10*3/uL (ref 0.1–1.0)
Monocytes Relative: 6 %
Neutro Abs: 4.8 10*3/uL (ref 1.7–7.7)
Neutrophils Relative %: 84 %
Platelets: 211 10*3/uL (ref 150–400)
RBC: 3.25 MIL/uL — ABNORMAL LOW (ref 3.87–5.11)
RDW: 14.2 % (ref 11.5–15.5)
WBC: 5.8 10*3/uL (ref 4.0–10.5)
nRBC: 0 % (ref 0.0–0.2)

## 2020-04-16 MED ORDER — IRON SUCROSE 20 MG/ML IV SOLN
200.0000 mg | Freq: Once | INTRAVENOUS | Status: AC
  Administered 2020-04-16: 200 mg via INTRAVENOUS
  Filled 2020-04-16: qty 10

## 2020-04-16 MED ORDER — SODIUM CHLORIDE 0.9 % IV SOLN
Freq: Once | INTRAVENOUS | Status: AC
  Filled 2020-04-16: qty 250

## 2020-04-16 NOTE — Progress Notes (Signed)
Pt tolerated infusion well with no signs of complications. VSS. Pt stable for discharge.   Susan Barajas [employer]

## 2020-04-16 NOTE — Progress Notes (Signed)
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     # CHRONIC ANEMIA EGD/capsule-WNL-2018 [Dr.vanga]-UGIB ; colonoscopy-2018 [Elliot] ; capsule-not done; hemoglobin around 8.8; iron saturation 5% [Dr.Tate].  Poor tolerance of p.o. iron; [Dr.Armbruster; [facility] July 2021]-high risk of anesthesia; on IV venofer  # CHF/[Dr.Mclaheny]; COPD [Dr.Kasa]- on Home 4-6lit/ O2  # SURVIVORSHIP:   # GENETICS:   # NGS: F-one Dec 2021-KIT MUTATION- G559D [KIT V559D mutant ranks first in the most frequent mutations in the exon 11]  DIAGNOSIS: GIST  STAGE: T3 N0 vs  T3N1      ;  GOALS: curative Vs control  CURRENT/MOST RECENT THERAPY : neoadjuant Gleevec   .   GIST (gastrointestinal stromal tumor) of small bowel, malignant ([place])  03/12/2020 Initial Diagnosis   GIST  (gastrointestinal stromal tumor) of small bowel, malignant (HCC)      Interval history-patient is tolerating Gleevec well without any significant side effects.  Reports no significant fatigue or diarrhea.  She does have baseline COPD and is on home oxygen.  Reports some improvement in her energy levels after receiving IV iron  ECOG PS- 2 Pain scale- 0   Review of systems- Review of Systems  Constitutional: Negative for chills, fever, malaise/fatigue and weight loss.  HENT: Negative for congestion, ear discharge and nosebleeds.   Eyes: Negative for blurred vision.  Respiratory: Negative for cough, hemoptysis, sputum production, shortness of breath and wheezing.   Cardiovascular: Negative for chest pain, palpitations, orthopnea and claudication.  Gastrointestinal: Negative for abdominal pain, blood in stool, constipation, diarrhea, heartburn, melena, nausea and vomiting.  Genitourinary: Negative for dysuria, flank pain, frequency, hematuria and urgency.  Musculoskeletal: Negative for back pain, joint pain and myalgias.  Skin: Negative for rash.  Neurological: Negative for dizziness, tingling, focal weakness, seizures, weakness and headaches.  Endo/Heme/Allergies: Does not bruise/bleed easily.  Psychiatric/Behavioral: Negative for depression and suicidal ideas. The patient does not have insomnia.        No Known Allergies   Past Medical History:  Diagnosis Date  . Asthma 1950  . Clotting disorder (HCC)    right leg  . 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 GIST (gastrointestinal stromal tumor) of small bowel, malignant ([place])   . Glaucoma 1949  . Hyperlipidemia   . Hypertension   . Obesity, unspecified   . Personal history of tobacco use, presenting hazards to health   . Varicose veins  of lower extremities with other complications      Past Surgical History:  Procedure Laterality Date  . BREAST BIOPSY Right 1991  . BREAST BIOPSY Left 2013  . COLONOSCOPY   2010   Dr. Vira Agar  . COLONOSCOPY  Jan 2016   Dr Vira Agar  . ESOPHAGOGASTRODUODENOSCOPY N/A 11/26/2016   Procedure: ESOPHAGOGASTRODUODENOSCOPY (EGD);  Surgeon: Lin Landsman, MD;  Location[facility] ENDOSCOPY;  Service: Gastroenterology;  Laterality: N/A;  . GIVENS CAPSULE STUDY N/A 11/26/2016   Procedure: GIVENS CAPSULE STUDY, if EGD negative, plan to drop capsule with scope if EGD negative;  Surgeon: Lin Landsman, MD;  Location: [facility] ENDOSCOPY;  Service: Gastroenterology;  Laterality: N/A;  . IVC FILTER INSERTION N/A 11/14/2016   Procedure: IVC Filter Insertion;  Surgeon: Katha Cabal, MD;  Location: [place] CV LAB;  Service: Cardiovascular;  Laterality: N/A;  . RIGHT/LEFT HEART CATH AND CORONARY ANGIOGRAPHY N/A 06/30/2019   Procedure: RIGHT/LEFT HEART CATH AND CORONARY ANGIOGRAPHY;  Surgeon: Larey Dresser, MD;  Location: [place] CV LAB;  Service: Cardiovascular;  Laterality: N/A;  . VEIN SURGERY Right 2006   Vein Closure Procedure; RF ablation of right GSV    Social History   Socioeconomic History  . Marital status: Single    Spouse name: Not on file  . Number of children: 2  . Years of education: Not on file  . Highest education level: Not on file  Occupational History  . Occupation: CELL    Employer: RETIRED  Tobacco Use  . Smoking status: Former Smoker    Packs/day: 1.50    Years: 20.00    Pack years: 30.00    Quit date: 1999    Years since quitting: 23.0  . Smokeless tobacco: Never Used  Vaping Use  . Vaping Use: Never used  Substance and Sexual Activity  . Alcohol use: No    Alcohol/week: 0.0 standard drinks  . Drug use: No  . Sexual activity: Not Currently  Other Topics Concern  . Not on file  Social History Narrative   Independent at baseline. Lives at home with her brother; in [place]. Quit smoking 20 years; no alcohol.    Social Determinants of Health   Financial Resource Strain: Not on file  Food Insecurity: Not on file   Transportation Needs: Not on file  Physical Activity: Not on file  Stress: Not on file  Social Connections: Not on file  Intimate Partner Violence: Not on file    Family History  Problem Relation Age of Onset  . Prostate cancer Father   . Other Sister        mouth cancer  . Hypertension Mother      Current Outpatient Medications:  .  ACCU-CHEK AVIVA PLUS test strip, 1 each 2 (two) times daily., Disp: , Rfl:  .  acetaminophen (TYLENOL) 325 MG tablet, Take 2 tablets (650 mg total) by mouth every 6 (six) hours as needed for mild pain (or Fever >/= 101)., Disp: , Rfl:  .  aspirin 81 MG chewable tablet, Chew 1 tablet (81 mg total) by mouth daily., Disp: 30 tablet, Rfl: 0 .  dapagliflozin propanediol (FARXIGA) 10 MG TABS tablet, Take 1 tablet (10 mg total) by mouth daily. Needs appointment for further refills., Disp: 30 tablet, Rfl: 0 .  ferrous gluconate (FERGON) 325 MG tablet, Take 325 mg by mouth in the morning and at bedtime. , Disp: , Rfl:  .  furosemide (LASIX) 40 MG tablet, Take 1 tablet (40 mg total)  by mouth in the morning AND 0.5 tablets (20 mg total) every evening., Disp: 45 tablet, Rfl: 3 .  HUMALOG KWIKPEN 100 UNIT/ML KiwkPen, Inject 10 Units into the skin 2 (two) times daily. , Disp: , Rfl:  .  imatinib (GLEEVEC) 100 MG tablet, Take 4 tablets (400 mg total) by mouth daily. Take with meals and large glass of water, Disp: 120 tablet, Rfl: 1 .  insulin glargine (LANTUS) 100 UNIT/ML injection, Inject 32 Units into the skin daily. , Disp: , Rfl:  .  latanoprost (XALATAN) 0.005 % ophthalmic solution, Place 1 drop into both eyes at bedtime. , Disp: , Rfl:  .  lovastatin (MEVACOR) 20 MG tablet, Take 20 mg by mouth every evening. , Disp: , Rfl:  .  macitentan (OPSUMIT) 10 MG tablet, Take 1 tablet (10 mg total) by mouth daily., Disp: 30 tablet, Rfl: 11 .  metFORMIN (GLUCOPHAGE) 1000 MG tablet, Take 1,000 mg by mouth daily. , Disp: , Rfl:  .  Multiple Vitamin (MULTIVITAMIN) tablet, Take  1 tablet by mouth daily., Disp: , Rfl:  .  omeprazole (PRILOSEC) 40 MG capsule, Take 40 mg by mouth daily., Disp: , Rfl:  .  ondansetron (ZOFRAN) 8 MG tablet, One pill every 8 hours as needed for nausea/vomitting., Disp: 45 tablet, Rfl: 1 .  potassium chloride SA (KLOR-CON) 20 MEQ tablet, Take 1 tablet (20 mEq total) by mouth daily., Disp: 90 tablet, Rfl: 3 .  tadalafil, PAH, (ALYQ) 20 MG tablet, Take 2 tablets (40 mg total) by mouth daily., Disp: 60 tablet, Rfl: 11 .  timolol (TIMOPTIC) 0.25 % ophthalmic solution, Place 1 drop into both eyes 2 (two) times daily. , Disp: , Rfl:  .  Tiotropium Bromide-Olodaterol (STIOLTO RESPIMAT) 2.5-2.5 MCG/ACT AERS, Inhale 2 puffs into the lungs daily., Disp: 1 g, Rfl: 5  Physical exam:  Vitals:   04/16/20 1316  BP: (!) 116/46  Pulse: 73  Resp: 16  Temp: (!) 97.4 F (36.3 C)  TempSrc: Tympanic  SpO2: (!) 88%  Weight: 197 lb 9.6 oz (89.6 kg)   Physical Exam Constitutional:      Comments: On home oxygen  Eyes:     Extraocular Movements: EOM normal.  Cardiovascular:     Rate and Rhythm: Normal rate and regular rhythm.     Heart sounds: Normal heart sounds.  Pulmonary:     Effort: Pulmonary effort is normal.     Comments: Breath sounds decreased over bilateral bases Musculoskeletal:     Cervical back: Normal range of motion.  Skin:    General: Skin is warm and dry.  Neurological:     Mental Status: She is alert and oriented to person, place, and time.      CMP Latest Ref Rng & Units 04/16/2020  Glucose 70 - 99 mg/dL 77  BUN 8 - 23 mg/dL 24(H)  Creatinine 0.44 - 1.00 mg/dL 1.00  Sodium 135 - 145 mmol/L 140  Potassium 3.5 - 5.1 mmol/L 4.1  Chloride 98 - 111 mmol/L 106  CO2 22 - 32 mmol/L 25  Calcium 8.9 - 10.3 mg/dL 8.8(L)  Total Protein 6.5 - 8.1 g/dL 6.7  Total Bilirubin 0.3 - 1.2 mg/dL 0.3  Alkaline Phos 38 - 126 U/L 55  AST 15 - 41 U/L 18  ALT 0 - 44 U/L 13   CBC Latest Ref Rng & Units 04/16/2020  WBC 4.0 - 10.5 K/uL 5.8   Hemoglobin 12.0 - 15.0 g/dL 9.5(L)  Hematocrit 36.0 - 46.0 % 30.2(L)  Platelets 150 - 400 K/uL 211      Assessment and plan- Patient is a 75 y.o. female with history of stage II GIST of the small bowel on neoadjuvant Gleevec and iron deficiency anemia here for routine follow-up  1.  Patient is tolerating Gleevec well without any significant side effects. NGS testing showed KIT V559D (exon 11) and ROS1 G2032R mutation.  Based on that she does not require any further up titration of her Gleevec dosing.  2.  Iron deficiency anemia: Dose 3 of Venofer today and she will follow up with Dr. Rogue Bussing as scheduled for dose 4 on 05/08/2020.  Hemoglobin is improved to 9.5 and does not require any blood transfusion today    Visit Diagnosis 1. High risk medication use   2. Iron deficiency anemia, unspecified iron deficiency anemia type   3. GIST (gastrointestinal stromal tumor) of small bowel, malignant ([place])      Dr. Randa Evens, MD, MPH [facility] at [facility] [phone] 04/16/2020 1:39 PM

## 2020-05-01 ENCOUNTER — Other Ambulatory Visit: Payer: Self-pay | Admitting: Internal Medicine

## 2020-05-01 DIAGNOSIS — C49A3 Gastrointestinal stromal tumor of small intestine: Secondary | ICD-10-CM

## 2020-05-07 MED FILL — IMATINIB MESYLATE 100 MG TA: 100 | 30 days supply | Qty: 120 | Fill #0

## 2020-05-08 ENCOUNTER — Inpatient Hospital Stay: Payer: Medicare Other

## 2020-05-08 ENCOUNTER — Encounter: Payer: Self-pay | Admitting: Internal Medicine

## 2020-05-08 ENCOUNTER — Inpatient Hospital Stay: Payer: Medicare Other | Admitting: Internal Medicine

## 2020-05-08 VITALS — BP 116/67 | HR 60

## 2020-05-08 DIAGNOSIS — C49A3 Gastrointestinal stromal tumor of small intestine: Secondary | ICD-10-CM

## 2020-05-08 DIAGNOSIS — E611 Iron deficiency: Secondary | ICD-10-CM

## 2020-05-08 DIAGNOSIS — D509 Iron deficiency anemia, unspecified: Secondary | ICD-10-CM | POA: Diagnosis not present

## 2020-05-08 LAB — CBC WITH DIFFERENTIAL/PLATELET
Abs Immature Granulocytes: 0.02 10*3/uL (ref 0.00–0.07)
Basophils Absolute: 0 10*3/uL (ref 0.0–0.1)
Basophils Relative: 0 %
Eosinophils Absolute: 0 10*3/uL (ref 0.0–0.5)
Eosinophils Relative: 0 %
HCT: 30.9 % — ABNORMAL LOW (ref 36.0–46.0)
Hemoglobin: 9.7 g/dL — ABNORMAL LOW (ref 12.0–15.0)
Immature Granulocytes: 0 %
Lymphocytes Relative: 9 %
Lymphs Abs: 0.5 10*3/uL — ABNORMAL LOW (ref 0.7–4.0)
MCH: 29 pg (ref 26.0–34.0)
MCHC: 31.4 g/dL (ref 30.0–36.0)
MCV: 92.5 fL (ref 80.0–100.0)
Monocytes Absolute: 0.4 10*3/uL (ref 0.1–1.0)
Monocytes Relative: 8 %
Neutro Abs: 4.7 10*3/uL (ref 1.7–7.7)
Neutrophils Relative %: 83 %
Platelets: 195 10*3/uL (ref 150–400)
RBC: 3.34 MIL/uL — ABNORMAL LOW (ref 3.87–5.11)
RDW: 15.1 % (ref 11.5–15.5)
WBC: 5.8 10*3/uL (ref 4.0–10.5)
nRBC: 0 % (ref 0.0–0.2)

## 2020-05-08 LAB — COMPREHENSIVE METABOLIC PANEL
ALT: 13 U/L (ref 0–44)
AST: 20 U/L (ref 15–41)
Albumin: 3.8 g/dL (ref 3.5–5.0)
Alkaline Phosphatase: 51 U/L (ref 38–126)
Anion gap: 10 (ref 5–15)
BUN: 20 mg/dL (ref 8–23)
CO2: 25 mmol/L (ref 22–32)
Calcium: 8.6 mg/dL — ABNORMAL LOW (ref 8.9–10.3)
Chloride: 107 mmol/L (ref 98–111)
Creatinine, Ser: 0.81 mg/dL (ref 0.44–1.00)
GFR, Estimated: >60 mL/min (ref >60)
Glucose, Bld: 81 mg/dL (ref 70–99)
Potassium: 4 mmol/L (ref 3.5–5.1)
Sodium: 142 mmol/L (ref 135–145)
Total Bilirubin: 0.2 mg/dL — ABNORMAL LOW (ref 0.3–1.2)
Total Protein: 6.8 g/dL (ref 6.5–8.1)

## 2020-05-08 MED ORDER — IRON SUCROSE 20 MG/ML IV SOLN
200.0000 mg | Freq: Once | INTRAVENOUS | Status: AC
  Administered 2020-05-08: 200 mg via INTRAVENOUS
  Filled 2020-05-08: qty 10

## 2020-05-08 MED ORDER — SODIUM CHLORIDE 0.9 % IV SOLN
Freq: Once | INTRAVENOUS | Status: AC
  Filled 2020-05-08: qty 250

## 2020-05-08 NOTE — Progress Notes (Signed)
Pt tolerated venofer infusion well with no signs of complications. VSS. Pt stable for discharge.   Susan Barajas [employer]

## 2020-05-08 NOTE — Addendum Note (Signed)
Addended by: Delice Bison E on: 05/08/2020 11:23 AM   Modules accepted: Orders

## 2020-05-08 NOTE — Progress Notes (Signed)
[place] NOTE  Patient Care Team: Albina Billet, MD as PCP - General (Unknown Physician Specialty) Wellington Hampshire, MD as PCP - Cardiology (Cardiology) Christene Lye, MD (General Surgery) Albina Billet, MD (Internal Medicine) Delana Meyer, Dolores Lory, MD (Vascular Surgery) Wilford Corner, MD as Consulting Physician (Gastroenterology) Cammie Sickle, MD as Consulting Physician (Hematology and Oncology)  CHIEF COMPLAINTS/PURPOSE OF CONSULTATION: Anemia   HEMATOLOGY HISTORY  Oncology History Overview Note  NOV 2021- CT Bx- TUMOR  Tumor Site: Peritoneum, jejunal  Histologic Type: Gastrointestinal stromal tumor, spindle cell type  Mitotic Rate: 1 per 5 mm squared  Histologic Grade: G1, low grade (mitotic rate less than or equal to 5  per 5 mm squared)- STAGE T2N1 [possible mesneteric LN]; CT A/P-9.8 x9.8 cm  # NOV 29th 2021- GLEEVEC 400 mg/day.     # CHRONIC ANEMIA EGD/capsule-WNL-2018 [Dr.vanga]-UGIB ; colonoscopy-2018 [[facility]] ; capsule-not done; hemoglobin around 8.8; iron saturation 5% [Dr.Tate].  Poor tolerance of p.o. 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   GIST (gastrointestinal stromal tumor) of small bowel, malignant ([place])  03/12/2020 Initial Diagnosis   GIST (gastrointestinal stromal tumor) of small bowel, malignant (HCC)      HISTORY OF PRESENTING ILLNESS:  Susan Barajas 75 y.o.  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  Patient denies any blood in stools or black-colored stools.  Denies any skin rash.  Denies any diarrhea.  No worsening swelling of the legs.  Review of Systems  Constitutional: Positive for malaise/fatigue. Negative for chills, diaphoresis, fever and weight loss.  HENT: Negative for nosebleeds and sore throat.   Eyes: Negative for double vision.  Respiratory: Positive for shortness of breath. Negative for cough, hemoptysis, sputum production and wheezing.   Cardiovascular: Negative for chest pain, palpitations, orthopnea and leg swelling.  Gastrointestinal: Negative for abdominal pain, blood in stool, constipation, diarrhea, heartburn, melena, nausea and vomiting.  Genitourinary: Negative for dysuria, frequency and urgency.  Musculoskeletal: Negative for back pain and joint pain.  Skin: Negative.  Negative for itching and rash.  Neurological: Negative for dizziness, tingling, focal weakness, weakness and headaches.  Endo/Heme/Allergies: Does not bruise/bleed easily.  Psychiatric/Behavioral: Negative for depression. The patient is not nervous/anxious and does not have insomnia.     MEDICAL HISTORY:  Past Medical History:  Diagnosis Date  . Asthma 1950  . Clotting disorder (HCC)    right leg  . Diabetes ([place]) Age 65   Type 2; insulin dependent  . GIST (gastrointestinal stromal tumor) of small bowel, malignant ([place])   . Glaucoma 1949  . Hyperlipidemia   . Hypertension   . Obesity, unspecified   . Personal history of tobacco use, presenting hazards to health   . Varicose veins of lower extremities with other complications     SURGICAL HISTORY: Past Surgical History:  Procedure Laterality Date  . BREAST BIOPSY Right 1991  . BREAST BIOPSY Left 2013  .  COLONOSCOPY  2010   Dr. Vira Agar  . COLONOSCOPY  Jan 2016   Dr Vira Agar  . ESOPHAGOGASTRODUODENOSCOPY N/A 11/26/2016   Procedure: ESOPHAGOGASTRODUODENOSCOPY (EGD);  Surgeon: Lin Landsman, MD;  Location: [facility]  ENDOSCOPY;  Service: Gastroenterology;  Laterality: N/A;  . GIVENS CAPSULE STUDY N/A 11/26/2016   Procedure: GIVENS CAPSULE STUDY, if EGD negative, plan to drop capsule with scope if EGD negative;  Surgeon: Lin Landsman, MD;  Location: [facility] ENDOSCOPY;  Service: Gastroenterology;  Laterality: N/A;  . IVC FILTER INSERTION N/A 11/14/2016   Procedure: IVC Filter Insertion;  Surgeon: Katha Cabal, MD;  Location: [place] CV LAB;  Service: Cardiovascular;  Laterality: N/A;  . RIGHT/LEFT HEART CATH AND CORONARY ANGIOGRAPHY N/A 06/30/2019   Procedure: RIGHT/LEFT HEART CATH AND CORONARY ANGIOGRAPHY;  Surgeon: Larey Dresser, MD;  Location: [place] CV LAB;  Service: Cardiovascular;  Laterality: N/A;  . VEIN SURGERY Right 2006   Vein Closure Procedure; RF ablation of right GSV    SOCIAL HISTORY: Social History   Socioeconomic History  . Marital status: Single    Spouse name: Not on file  . Number of children: 2  . Years of education: Not on file  . Highest education level: Not on file  Occupational History  . Occupation: CELL    Employer: RETIRED  Tobacco Use  . Smoking status: Former Smoker    Packs/day: 1.50    Years: 20.00    Pack years: 30.00    Quit date: 1999    Years since quitting: 23.0  . Smokeless tobacco: Never Used  Vaping Use  . Vaping Use: Never used  Substance and Sexual Activity  . Alcohol use: No    Alcohol/week: 0.0 standard drinks  . Drug use: No  . Sexual activity: Not Currently  Other Topics Concern  . Not on file  Social History Narrative   Independent at baseline. Lives at home with her brother; in [place]. Quit smoking 20 years; no alcohol.    Social Determinants of Health   Financial Resource Strain: Not on file  Food Insecurity: Not on file  Transportation Needs: Not on file  Physical Activity: Not on file  Stress: Not on file  Social Connections: Not on file  Intimate Partner Violence: Not on file    FAMILY HISTORY: Family  History  Problem Relation Age of Onset  . Prostate cancer Father   . Other Sister        mouth cancer  . Hypertension Mother     ALLERGIES:  has No Known Allergies.  MEDICATIONS:  Current Outpatient Medications  Medication Sig Dispense Refill  . ACCU-CHEK AVIVA PLUS test strip 1 each 2 (two) times daily.    Marland Kitchen acetaminophen (TYLENOL) 325 MG tablet Take 2 tablets (650 mg total) by mouth every 6 (six) hours as needed for mild pain (or Fever >/= 101).    Marland Kitchen aspirin 81 MG chewable tablet Chew 1 tablet (81 mg total) by mouth daily. 30 tablet 0  . dapagliflozin propanediol (FARXIGA) 10 MG TABS tablet Take 1 tablet (10 mg total) by mouth daily. Needs appointment for further refills. 30 tablet 0  . ferrous gluconate (FERGON) 325 MG tablet Take 325 mg by mouth in the morning and at bedtime.     . furosemide (LASIX) 40 MG tablet Take 1 tablet (40 mg total) by mouth in the morning AND 0.5 tablets (20 mg total) every evening. 45 tablet 3  . HUMALOG KWIKPEN 100 UNIT/ML KiwkPen Inject 10 Units  into the skin 2 (two) times daily.     Marland Kitchen imatinib (GLEEVEC) 100 MG tablet TAKE 4 TABLETS (400 MG TOTAL) BY MOUTH DAILY. TAKE WITH MEALS AND LARGE GLASS OF WATER 120 tablet 1  . insulin glargine (LANTUS) 100 UNIT/ML injection Inject 32 Units into the skin daily.     Marland Kitchen latanoprost (XALATAN) 0.005 % ophthalmic solution Place 1 drop into both eyes at bedtime.     . lovastatin (MEVACOR) 20 MG tablet Take 20 mg by mouth every evening.     . macitentan (OPSUMIT) 10 MG tablet Take 1 tablet (10 mg total) by mouth daily. 30 tablet 11  . metFORMIN (GLUCOPHAGE) 1000 MG tablet Take 1,000 mg by mouth daily.     . Multiple Vitamin (MULTIVITAMIN) tablet Take 1 tablet by mouth daily.    Marland Kitchen omeprazole (PRILOSEC) 40 MG capsule Take 40 mg by mouth daily.    . ondansetron (ZOFRAN) 8 MG tablet One pill every 8 hours as needed for nausea/vomitting. 45 tablet 1  . potassium chloride SA (KLOR-CON) 20 MEQ tablet Take 1 tablet (20 mEq total)  by mouth daily. 90 tablet 3  . tadalafil, PAH, (ALYQ) 20 MG tablet Take 2 tablets (40 mg total) by mouth daily. 60 tablet 11  . timolol (TIMOPTIC) 0.25 % ophthalmic solution Place 1 drop into both eyes 2 (two) times daily.     . Tiotropium Bromide-Olodaterol (STIOLTO RESPIMAT) 2.5-2.5 MCG/ACT AERS Inhale 2 puffs into the lungs daily. 1 g 5   No current facility-administered medications for this visit.      PHYSICAL EXAMINATION:   Vitals:   05/08/20 1043  BP: (!) 123/51  Pulse: 72  Resp: 16  Temp: (!) 97.5 F (36.4 C)  SpO2: 95%   Filed Weights   05/08/20 1043  Weight: 198 lb 9.6 oz (90.1 kg)    Physical Exam Constitutional:      Comments: 6 L of oxygen nasal cannula.  Walk independently. She is alone.  HENT:     Head: Normocephalic and atraumatic.     Mouth/Throat:     Pharynx: No oropharyngeal exudate.  Eyes:     Pupils: Pupils are equal, round, and reactive to light.  Cardiovascular:     Rate and Rhythm: Normal rate and regular rhythm.  Pulmonary:     Effort: No respiratory distress.     Breath sounds: No wheezing.     Comments: Decreased breath sounds. Abdominal:     General: Bowel sounds are normal. There is no distension.     Palpations: Abdomen is soft. There is no mass.     Tenderness: There is no abdominal tenderness. There is no guarding or rebound.  Musculoskeletal:        General: No tenderness. Normal range of motion.     Cervical back: Normal range of motion and neck supple.  Skin:    General: Skin is warm.  Neurological:     Mental Status: She is alert and oriented to person, place, and time.  Psychiatric:        Mood and Affect: Affect normal.     LABORATORY DATA:  I have reviewed the data as listed Lab Results  Component Value Date   WBC 5.8 05/08/2020   HGB 9.7 (L) 05/08/2020   HCT 30.9 (L) 05/08/2020   MCV 92.5 05/08/2020   PLT 195 05/08/2020   Recent Labs    08/15/19 1450 08/23/19 1618 10/28/19 1213 02/10/20 1246  03/26/20 1006 04/16/20 1246 05/08/20 1022  NA  136 139 138   < > 137 140 142  K 4.3 4.3 4.2   < > 4.2 4.1 4.0  CL 102 103 107   < > 105 106 107  CO2 _0 < > _1 GLUCOSE 202* 140* 103*   < > 64* 77 81  BUN _2 < > 20 24* 20  CREATININE 0.95 0.88 0.78   < > 0.73 1.00 0.81  CALCIUM 8.6* 9.2 8.6*   < > 8.7* 8.8* 8.6*  GFRNONAA 59* >60 >60   < > >60 59* >60  GFRAA >60 >60 >60  --   --   --   --   PROT 6.8  --   --    < > 6.6 6.7 6.8  ALBUMIN 3.7  --   --    < > 3.7 3.8 3.8  AST 17  --   --    < > _3 ALT 13  --   --    < > _4 ALKPHOS 51  --   --    < > 48 55 51  BILITOT 0.5  --   --    < > 0.6 0.3 0.2*   < > = values in this interval not displayed.     No results found.  GIST (gastrointestinal stromal tumor) of small bowel, malignant ([place]) # GIST of small bowel-low grade STAGE II [T3N0] vs  IV [N1] [low Mitotic index; T=9.8cm- T3; N1- ? mesneteric LN] [s/p IR biopsy]. Localized disease;  STABLE.   #Patient on Gleevec 400 mg once a day tolerating well. Continue the current dose at this time.  Discussed with the patient that we will repeat a PET scan in about a month.  If patient has good response on the PET scan resection/surgery could be considered.  Will discuss with Dr. Tollie Pizza.  #CHF/COPD home O2 4-6Lits;-borderline respiratory status- STABLE.   #Iron deficient anemia [[may 2021]-on saturation 3%; Ferritin-5.]- sec to small bowel GIST.  Today hemoglobin is  9.7;   Proceed with Venofer today.  # DISPOSITION:.   # Venofer TODAY;  # Follow up in 4 weeks-  MD; labs-cbc/cmp;venofer; PET scan prior- Dr.B     All questions were answered. The patient knows to call the clinic with any problems, questions or concerns.    Cammie Sickle, MD 05/08/2020 11:17 AM

## 2020-05-08 NOTE — Assessment & Plan Note (Addendum)
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 mesneteric LN] [s/p IR biopsy]. Localized disease;  STABLE.   #Patient on Gleevec 400 mg once a day tolerating well. Continue the current dose at this time.  Discussed with the patient that we will repeat a PET scan in about a month.  If patient has good response on the PET scan resection/surgery could be considered.  Will discuss with Dr. Tollie Pizza.  #CHF/COPD home O2 4-6Lits;-borderline respiratory status- STABLE.   #Iron deficient anemia [[may 2021]-on saturation 3%; Ferritin-5.]- sec to small bowel GIST.  Today hemoglobin is  9.7;   Proceed with Venofer today.  # DISPOSITION:.   # Venofer TODAY;  # Follow up in 4 weeks-  MD; labs-cbc/cmp;venofer; PET scan prior- Dr.B

## 2020-05-14 ENCOUNTER — Other Ambulatory Visit (HOSPITAL_COMMUNITY): Payer: Self-pay | Admitting: Internal Medicine

## 2020-05-31 ENCOUNTER — Encounter
Admission: RE | Admit: 2020-05-31 | Discharge: 2020-05-31 | Disposition: A | Discharge status: Home / Self Care | Payer: Medicare Other | Source: Ambulatory Visit | Attending: Internal Medicine | Admitting: Internal Medicine

## 2020-05-31 ENCOUNTER — Other Ambulatory Visit: Payer: Self-pay

## 2020-05-31 DIAGNOSIS — C49A3 Gastrointestinal stromal tumor of small intestine: Secondary | ICD-10-CM | POA: Insufficient documentation

## 2020-05-31 DIAGNOSIS — I700 Atherosclerosis of aorta: Secondary | ICD-10-CM | POA: Insufficient documentation

## 2020-05-31 DIAGNOSIS — I2510 Atherosclerotic heart disease of native coronary artery without angina pectoris: Secondary | ICD-10-CM | POA: Diagnosis not present

## 2020-05-31 DIAGNOSIS — D259 Leiomyoma of uterus, unspecified: Secondary | ICD-10-CM | POA: Insufficient documentation

## 2020-05-31 DIAGNOSIS — R1902 Left upper quadrant abdominal swelling, mass and lump: Secondary | ICD-10-CM | POA: Diagnosis not present

## 2020-05-31 LAB — GLUCOSE, CAPILLARY: Glucose-Capillary: 79 mg/dL (ref 70–99)

## 2020-05-31 MED ORDER — FLUDEOXYGLUCOSE F - 18 (FDG) INJECTION
10.3000 | Freq: Once | INTRAVENOUS | Status: AC | PRN
  Administered 2020-05-31: 10.71 via INTRAVENOUS

## 2020-06-04 MED FILL — IMATINIB MESYLATE 100 MG TA: 100 | 30 days supply | Qty: 120 | Fill #1

## 2020-06-05 ENCOUNTER — Other Ambulatory Visit: Payer: Self-pay

## 2020-06-05 ENCOUNTER — Inpatient Hospital Stay: Payer: Medicare Other | Admitting: Internal Medicine

## 2020-06-05 ENCOUNTER — Inpatient Hospital Stay: Payer: Medicare Other

## 2020-06-05 ENCOUNTER — Other Ambulatory Visit: Payer: Self-pay | Admitting: *Deleted

## 2020-06-05 ENCOUNTER — Inpatient Hospital Stay: Payer: Medicare Other | Attending: Internal Medicine

## 2020-06-05 VITALS — BP 128/58 | HR 78

## 2020-06-05 DIAGNOSIS — I110 Hypertensive heart disease with heart failure: Secondary | ICD-10-CM | POA: Diagnosis not present

## 2020-06-05 DIAGNOSIS — J449 Chronic obstructive pulmonary disease, unspecified: Secondary | ICD-10-CM | POA: Insufficient documentation

## 2020-06-05 DIAGNOSIS — C49A3 Gastrointestinal stromal tumor of small intestine: Secondary | ICD-10-CM | POA: Diagnosis present

## 2020-06-05 DIAGNOSIS — Z79899 Other long term (current) drug therapy: Secondary | ICD-10-CM | POA: Insufficient documentation

## 2020-06-05 DIAGNOSIS — I700 Atherosclerosis of aorta: Secondary | ICD-10-CM | POA: Insufficient documentation

## 2020-06-05 DIAGNOSIS — E611 Iron deficiency: Secondary | ICD-10-CM

## 2020-06-05 DIAGNOSIS — D509 Iron deficiency anemia, unspecified: Secondary | ICD-10-CM | POA: Diagnosis not present

## 2020-06-05 DIAGNOSIS — I509 Heart failure, unspecified: Secondary | ICD-10-CM | POA: Diagnosis not present

## 2020-06-05 LAB — CBC WITH DIFFERENTIAL/PLATELET
Abs Immature Granulocytes: 0.03 10*3/uL (ref 0.00–0.07)
Basophils Absolute: 0 10*3/uL (ref 0.0–0.1)
Basophils Relative: 0 %
Eosinophils Absolute: 0.3 10*3/uL (ref 0.0–0.5)
Eosinophils Relative: 4 %
HCT: 28.4 % — ABNORMAL LOW (ref 36.0–46.0)
Hemoglobin: 8.7 g/dL — ABNORMAL LOW (ref 12.0–15.0)
Immature Granulocytes: 0 %
Lymphocytes Relative: 7 %
Lymphs Abs: 0.5 10*3/uL — ABNORMAL LOW (ref 0.7–4.0)
MCH: 28.8 pg (ref 26.0–34.0)
MCHC: 30.6 g/dL (ref 30.0–36.0)
MCV: 94 fL (ref 80.0–100.0)
Monocytes Absolute: 0.4 10*3/uL (ref 0.1–1.0)
Monocytes Relative: 6 %
Neutro Abs: 6.3 10*3/uL (ref 1.7–7.7)
Neutrophils Relative %: 83 %
Platelets: 223 10*3/uL (ref 150–400)
RBC: 3.02 MIL/uL — ABNORMAL LOW (ref 3.87–5.11)
RDW: 17.4 % — ABNORMAL HIGH (ref 11.5–15.5)
WBC: 7.5 10*3/uL (ref 4.0–10.5)
nRBC: 0 % (ref 0.0–0.2)

## 2020-06-05 LAB — COMPREHENSIVE METABOLIC PANEL
ALT: 14 U/L (ref 0–44)
AST: 18 U/L (ref 15–41)
Albumin: 3.7 g/dL (ref 3.5–5.0)
Alkaline Phosphatase: 53 U/L (ref 38–126)
Anion gap: 10 (ref 5–15)
BUN: 29 mg/dL — ABNORMAL HIGH (ref 8–23)
CO2: 25 mmol/L (ref 22–32)
Calcium: 8.6 mg/dL — ABNORMAL LOW (ref 8.9–10.3)
Chloride: 108 mmol/L (ref 98–111)
Creatinine, Ser: 0.91 mg/dL (ref 0.44–1.00)
GFR, Estimated: >60 mL/min (ref >60)
Glucose, Bld: 118 mg/dL — ABNORMAL HIGH (ref 70–99)
Potassium: 3.9 mmol/L (ref 3.5–5.1)
Sodium: 143 mmol/L (ref 135–145)
Total Bilirubin: 0.4 mg/dL (ref 0.3–1.2)
Total Protein: 7.1 g/dL (ref 6.5–8.1)

## 2020-06-05 LAB — IRON AND TIBC
Iron: 38 ug/dL (ref 28–170)
Saturation Ratios: 11 % (ref 10.4–31.8)
TIBC: 361 ug/dL (ref 250–450)
UIBC: 323 ug/dL

## 2020-06-05 LAB — FERRITIN: Ferritin: 17 ng/mL (ref 11–307)

## 2020-06-05 MED ORDER — IRON SUCROSE 20 MG/ML IV SOLN
200.0000 mg | Freq: Once | INTRAVENOUS | Status: AC
  Administered 2020-06-05: 200 mg via INTRAVENOUS
  Filled 2020-06-05: qty 10

## 2020-06-05 MED ORDER — SODIUM CHLORIDE 0.9 % IV SOLN
Freq: Once | INTRAVENOUS | Status: AC
  Filled 2020-06-05: qty 250

## 2020-06-05 NOTE — Assessment & Plan Note (Addendum)
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 mesneteric LN] [s/p IR biopsy]. Localized disease on Gleevec 400 milligrams [nov 28th 2021].  PET scan shows significant improvement of the FDG activity; however slight shrinkage of the tumor.  No new disease noted.   # Discussed with Dr. Bary Castilla; recommend continued Gleevec for the next 4 months [total of 6 months neoadjuvant therapy] for maximal shrinkage/best response.  Patient tolerating [place] very well.  #CHF/COPD home O2 4-6Lits;-borderline respiratory status-stable  #Iron deficient anemia [[may 2021]-on saturation 3%; Ferritin-5.]- sec to small bowel GIST.  Today hemoglobin is  8.5   Proceed with Venofer today.  # DISPOSITION:.   # Venofer TODAY;  # venofer weekly x1 # Follow up in 4 weeks-  MD; labs-cbc/cmp;possible venofer;  Dr.B

## 2020-06-05 NOTE — Progress Notes (Signed)
[facility] NOTE  Patient Care Team: Albina Billet, MD as PCP - General (Unknown Physician Specialty) Wellington Hampshire, MD as PCP - Cardiology (Cardiology) Christene Lye, MD (General Surgery) Albina Billet, MD (Internal Medicine) Delana Meyer, Dolores Lory, MD (Vascular Surgery) Wilford Corner, MD as Consulting Physician (Gastroenterology) Cammie Sickle, MD as Consulting Physician (Hematology and Oncology)  CHIEF COMPLAINTS/PURPOSE OF CONSULTATION: Anemia   HEMATOLOGY HISTORY  Oncology History Overview Note  NOV 2021- CT Bx- TUMOR  Tumor Site: Peritoneum, jejunal  Histologic Type: Gastrointestinal stromal tumor, spindle cell type  Mitotic Rate: 1 per 5 mm squared  Histologic Grade: G1, low grade (mitotic rate less than or equal to 5  per 5 mm squared)- STAGE T2N1 [possible mesneteric LN]; CT A/P-9.8 x9.8 cm  # NOV 29th 2021- GLEEVEC 400 mg/day.     # CHRONIC ANEMIA EGD/capsule-WNL-2018 [Dr.vanga]-UGIB ; colonoscopy-2018 [[facility]] ; capsule-not done; hemoglobin around 8.8; iron saturation 5% [Dr.Tate].  Poor tolerance of p.o. 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   GIST (gastrointestinal stromal tumor) of small bowel, malignant ([place])  03/12/2020 Initial Diagnosis   GIST (gastrointestinal stromal tumor) of small bowel, malignant (HCC)      HISTORY OF PRESENTING ILLNESS:  Susan Barajas 75 y.o.  female severe COPD on 4 to 6 L of oxygen ; anemia of  iron deficiency; likely secondary to small bowel GIST on neoadjuvant Gleevec is here for follow-up/  review the results of the PET scan.  Patient denies any blood in stools or black or stools.  Does admit to fatigue.  Complains of mild skin rash in the back of her neck.  No itching.  No diarrhea.  No swelling the legs.  Review of Systems  Constitutional: Positive for malaise/fatigue. Negative for chills, diaphoresis, fever and weight loss.  HENT: Negative for nosebleeds and sore throat.   Eyes: Negative for double vision.  Respiratory: Positive for shortness of breath. Negative for cough, hemoptysis, sputum production and wheezing.   Cardiovascular: Negative for chest pain, palpitations, orthopnea and leg swelling.  Gastrointestinal: Negative for abdominal pain, blood in stool, constipation, diarrhea, heartburn, melena, nausea and vomiting.  Genitourinary: Negative for dysuria, frequency and urgency.  Musculoskeletal: Negative for back pain and joint pain.  Skin: Negative.  Negative for itching and rash.  Neurological: Negative for dizziness, tingling, focal weakness, weakness and headaches.  Endo/Heme/Allergies: Does not bruise/bleed easily.  Psychiatric/Behavioral: Negative for depression. The patient is not nervous/anxious and does not have insomnia.     MEDICAL HISTORY:  Past Medical History:  Diagnosis Date  . Asthma 1950  . Clotting disorder (HCC)    right leg  . 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 GIST (gastrointestinal stromal tumor) of small bowel, malignant ([place])   . Glaucoma 1949  . Hyperlipidemia   . Hypertension   . Obesity, unspecified   . Personal history of tobacco use, presenting hazards to health   . Varicose veins of lower extremities with other complications     SURGICAL HISTORY:  Past Surgical History:  Procedure Laterality Date  . BREAST BIOPSY Right 1991  . BREAST BIOPSY Left 2013  . COLONOSCOPY  2010   Dr. Vira Agar  . COLONOSCOPY  Jan 2016   Dr Vira Agar  . ESOPHAGOGASTRODUODENOSCOPY N/A 11/26/2016   Procedure:  ESOPHAGOGASTRODUODENOSCOPY (EGD);  Surgeon: Lin Landsman, MD;  Location: [facility] ENDOSCOPY;  Service: Gastroenterology;  Laterality: N/A;  . GIVENS CAPSULE STUDY N/A 11/26/2016   Procedure: GIVENS CAPSULE STUDY, if EGD negative, plan to drop capsule with scope if EGD negative;  Surgeon: Lin Landsman, MD;  Location: [facility] ENDOSCOPY;  Service: Gastroenterology;  Laterality: N/A;  . IVC FILTER INSERTION N/A 11/14/2016   Procedure: IVC Filter Insertion;  Surgeon: Katha Cabal, MD;  Location: [facility];  Service: Cardiovascular;  Laterality: N/A;  . RIGHT/LEFT HEART CATH AND CORONARY ANGIOGRAPHY N/A 06/30/2019   Procedure: RIGHT/LEFT HEART CATH AND CORONARY ANGIOGRAPHY;  Surgeon: Larey Dresser, MD;  Location: [facility];  Service: Cardiovascular;  Laterality: N/A;  . VEIN SURGERY Right 2006   Vein Closure Procedure; RF ablation of right GSV    SOCIAL HISTORY: Social History   Socioeconomic History  . Marital status: Single    Spouse name: Not on file  . Number of children: 2  . Years of education: Not on file  . Highest education level: Not on file  Occupational History  . Occupation: CELL    Employer: RETIRED  Tobacco Use  . Smoking status: Former Smoker    Packs/day: 1.50    Years: 20.00    Pack years: 30.00    Quit date: 1999    Years since quitting: 23.1  . Smokeless tobacco: Never Used  Vaping Use  . Vaping Use: Never used  Substance and Sexual Activity  . Alcohol use: No    Alcohol/week: 0.0 standard drinks  . Drug use: No  . Sexual activity: Not Currently  Other Topics Concern  . Not on file  Social History Narrative   Independent at baseline. Lives at home with her brother; in [place]. Quit smoking 20 years; no alcohol.    Social Determinants of Health   Financial Resource Strain: Not on file  Food Insecurity: Not on file  Transportation Needs: Not on file  Physical Activity: Not on file  Stress: Not on file  Social  Connections: Not on file  Intimate Partner Violence: Not on file    FAMILY HISTORY: Family History  Problem Relation Age of Onset  . Prostate cancer Father   . Other Sister        mouth cancer  . Hypertension Mother     ALLERGIES:  has No Known Allergies.  MEDICATIONS:  Current Outpatient Medications  Medication Sig Dispense Refill  . ACCU-CHEK AVIVA PLUS test strip 1 each 2 (two) times daily.    Marland Kitchen acetaminophen (TYLENOL) 325 MG tablet Take 2 tablets (650 mg total) by mouth every 6 (six) hours as needed for mild pain (or Fever >/= 101).    Marland Kitchen aspirin 81 MG chewable tablet Chew 1 tablet (81 mg total) by mouth daily. 30 tablet 0  . FARXIGA 10 MG TABS tablet TAKE 1 TABLET BY MOUTH ONCE DAILY (NEEDS  APPOINTMENT  FOR  FURTHER  REFILLS) 30 tablet 0  . ferrous gluconate (FERGON) 325 MG tablet Take 325 mg by mouth in the morning and at bedtime.     . furosemide (LASIX) 40 MG tablet Take 1 tablet (40 mg total) by mouth in the morning  AND 0.5 tablets (20 mg total) every evening. 45 tablet 3  . HUMALOG KWIKPEN 100 UNIT/ML KiwkPen Inject 10 Units into the skin 2 (two) times daily.     Marland Kitchen imatinib (GLEEVEC) 100 MG tablet TAKE 4 TABLETS (400 MG TOTAL) BY MOUTH DAILY. TAKE WITH MEALS AND LARGE GLASS OF WATER 120 tablet 1  . insulin glargine (LANTUS) 100 UNIT/ML injection Inject 32 Units into the skin daily.     Marland Kitchen latanoprost (XALATAN) 0.005 % ophthalmic solution Place 1 drop into both eyes at bedtime.     . lovastatin (MEVACOR) 20 MG tablet Take 20 mg by mouth every evening.     . macitentan (OPSUMIT) 10 MG tablet Take 1 tablet (10 mg total) by mouth daily. 30 tablet 11  . metFORMIN (GLUCOPHAGE) 1000 MG tablet Take 1,000 mg by mouth daily.     . Multiple Vitamin (MULTIVITAMIN) tablet Take 1 tablet by mouth daily.    Marland Kitchen omeprazole (PRILOSEC) 40 MG capsule Take 40 mg by mouth daily.    . ondansetron (ZOFRAN) 8 MG tablet One pill every 8 hours as needed for nausea/vomitting. 45 tablet 1  . potassium  chloride SA (KLOR-CON) 20 MEQ tablet Take 1 tablet (20 mEq total) by mouth daily. 90 tablet 3  . tadalafil, PAH, (ALYQ) 20 MG tablet Take 2 tablets (40 mg total) by mouth daily. 60 tablet 11  . timolol (TIMOPTIC) 0.25 % ophthalmic solution Place 1 drop into both eyes 2 (two) times daily.     . Tiotropium Bromide-Olodaterol (STIOLTO RESPIMAT) 2.5-2.5 MCG/ACT AERS Inhale 2 puffs into the lungs daily. 1 g 5   No current facility-administered medications for this visit.      PHYSICAL EXAMINATION:   Vitals:   06/05/20 1100  BP: (!) 136/54  Pulse: 79  Resp: 20  Temp: (!) 96.8 F (36 C)  SpO2: 100%   Filed Weights   06/05/20 1100  Weight: 197 lb 12.8 oz (89.7 kg)    Physical Exam Constitutional:      Comments: 6 L of oxygen nasal cannula.  Walk independently. She is alone.  HENT:     Head: Normocephalic and atraumatic.     Mouth/Throat:     Pharynx: No oropharyngeal exudate.  Eyes:     Pupils: Pupils are equal, round, and reactive to light.  Cardiovascular:     Rate and Rhythm: Normal rate and regular rhythm.  Pulmonary:     Effort: No respiratory distress.     Breath sounds: No wheezing.     Comments: Decreased breath sounds. Abdominal:     General: Bowel sounds are normal. There is no distension.     Palpations: Abdomen is soft. There is no mass.     Tenderness: There is no abdominal tenderness. There is no guarding or rebound.  Musculoskeletal:        General: No tenderness. Normal range of motion.     Cervical back: Normal range of motion and neck supple.  Skin:    General: Skin is warm.  Neurological:     Mental Status: She is alert and oriented to person, place, and time.  Psychiatric:        Mood and Affect: Affect normal.     LABORATORY DATA:  I have reviewed the data as listed Lab Results  Component Value Date   WBC 7.5 06/05/2020   HGB 8.7 (L) 06/05/2020   HCT 28.4 (L) 06/05/2020   MCV 94.0 06/05/2020   PLT 223 06/05/2020   Recent  Labs     08/15/19 1450 08/23/19 1618 10/28/19 1213 02/10/20 1246 04/16/20 1246 05/08/20 1022 06/05/20 1031  NA 136 139 138   < > 140 142 143  K 4.3 4.3 4.2   < > 4.1 4.0 3.9  CL 102 103 107   < > 106 107 108  CO2 _0 < > _1 GLUCOSE 202* 140* 103*   < > 77 81 118*  BUN _2 < > 24* 20 29*  CREATININE 0.95 0.88 0.78   < > 1.00 0.81 0.91  CALCIUM 8.6* 9.2 8.6*   < > 8.8* 8.6* 8.6*  GFRNONAA 59* >60 >60   < > 59* >60 >60  GFRAA >60 >60 >60  --   --   --   --   PROT 6.8  --   --    < > 6.7 6.8 7.1  ALBUMIN 3.7  --   --    < > 3.8 3.8 3.7  AST 17  --   --    < > _3 ALT 13  --   --    < > _4 ALKPHOS 51  --   --    < > 55 51 53  BILITOT 0.5  --   --    < > 0.3 0.2* 0.4   < > = values in this interval not displayed.     NM PET Image Restag (PS) Skull Base To Thigh  Result Date: 06/01/2020 CLINICAL DATA:  Subsequent treatment strategy for GI stromal tumor of the small bowel. EXAM: NUCLEAR MEDICINE PET SKULL BASE TO THIGH TECHNIQUE: 10.7 mCi F-18 FDG was injected intravenously. Full-ring PET imaging was performed from the skull base to thigh after the radiotracer. CT data was obtained and used for attenuation correction and anatomic localization. Fasting blood glucose: 79 mg/dl COMPARISON:  Abdominopelvic CT 02/15/2020.  PET-CT 02/22/2020. FINDINGS: Mediastinal blood pool activity: SUV max 1.4 NECK: No hypermetabolic cervical lymph nodes are identified.There are no lesions of the pharyngeal mucosal space. Incidental CT findings: none CHEST: There are no hypermetabolic mediastinal, hilar or axillary lymph nodes. Previously noted right paratracheal and AP window nodes are stable in size, without hypermetabolic activity. No hypermetabolic pulmonary activity or suspicious nodularity. Incidental CT findings: Diffuse atherosclerosis of the coronary arteries again noted with lesser involvement of the aorta and great vessels. Stable mild thyroid heterogeneity without  hypermetabolic activity. ABDOMEN/PELVIS: There is no hypermetabolic activity within the liver, adrenal glands, spleen or pancreas. Compared with the prior studies, the known left upper quadrant mass has slightly decreased in size, now measuring 8.8 x 7.9 x 8.1 cm. The previously demonstrated hypermetabolic activity peripherally within this mass has decreased. SUV max is now 3.3 (previously 6.2). There are central calcifications within this lesion which shows no definite involvement of adjacent bowel. No new or enlarging abdominal masses or hypermetabolic lymph nodes identified. There is hypermetabolic activity throughout the sigmoid colon and rectum which has mildly increased compared with the prior study. This corresponds with diffuse diverticular changes and wall thickening, but no surrounding inflammation to strongly suggest active inflammation. Incidental CT findings: Multiple calcified uterine fibroids are again noted, including an exophytic component on the left which shows no hypermetabolic activity. There is diffuse aortic and branch vessel atherosclerosis, and an IVC filter. As above, diffuse diverticular changes and wall thickening the sigmoid colon. SKELETON: There is no hypermetabolic activity to suggest osseous metastatic  disease. Incidental CT findings: Stable mild spondylosis. IMPRESSION: 1. Interval decreased size and decreased metabolic activity within dominant left upper quadrant mass consistent with response to therapy. No disease progression or metastases identified. 2. Increased diffuse metabolic activity associated with the sigmoid colon and rectum, likely related to chronic diverticulosis and wall thickening. No significant surrounding inflammatory changes. 3. Stable incidental findings including calcified uterine fibroids, coronary and Aortic Atherosclerosis (ICD10-I70.0). Electronically Signed   By: Richardean Sale M.D.   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 mesneteric LN] [s/p IR biopsy]. Localized disease on Gleevec 400 milligrams [nov 28th 2021].  PET scan shows significant improvement of the FDG activity; however slight shrinkage of the tumor.  No new disease noted.   # Discussed with Dr. Bary Castilla; recommend continued Gleevec for the next 4 months [total of 6 months neoadjuvant therapy] for maximal shrinkage/best response.  Patient tolerating [place] very well.  #CHF/COPD home O2 4-6Lits;-borderline respiratory status-stable  #Iron deficient anemia [[may 2021]-on saturation 3%; Ferritin-5.]- sec to small bowel GIST.  Today hemoglobin is  8.5   Proceed with Venofer today.  # DISPOSITION:.   # Venofer TODAY;  # venofer weekly x1 # Follow up in 4 weeks-  MD; labs-cbc/cmp;possible venofer;  Dr.B     All questions were answered. The patient knows to call the clinic with any problems, questions or concerns.    Cammie Sickle, MD 06/05/2020 12:28 PM

## 2020-06-06 ENCOUNTER — Telehealth: Payer: Self-pay | Admitting: Internal Medicine

## 2020-06-06 NOTE — Telephone Encounter (Signed)
On 2/22-I spoke to patient regarding my discussion with Dr. Bary Castilla. The plan is to continue [place] until best response/will reimage in 3 months [6 months of Gleevec] patient in agreement.  FYI-Dr.Byrnett.

## 2020-06-11 ENCOUNTER — Other Ambulatory Visit: Payer: Self-pay | Admitting: Adult Health

## 2020-06-13 ENCOUNTER — Other Ambulatory Visit (HOSPITAL_COMMUNITY): Payer: Self-pay | Admitting: Cardiology

## 2020-06-13 ENCOUNTER — Inpatient Hospital Stay: Payer: Medicare Other | Attending: Internal Medicine

## 2020-06-13 ENCOUNTER — Other Ambulatory Visit: Payer: Self-pay

## 2020-06-13 VITALS — BP 120/78 | HR 80

## 2020-06-13 DIAGNOSIS — D509 Iron deficiency anemia, unspecified: Secondary | ICD-10-CM | POA: Insufficient documentation

## 2020-06-13 DIAGNOSIS — E611 Iron deficiency: Secondary | ICD-10-CM

## 2020-06-13 DIAGNOSIS — Z79899 Other long term (current) drug therapy: Secondary | ICD-10-CM | POA: Diagnosis not present

## 2020-06-13 MED ORDER — IRON SUCROSE 20 MG/ML IV SOLN
200.0000 mg | Freq: Once | INTRAVENOUS | Status: AC
  Administered 2020-06-13: 200 mg via INTRAVENOUS
  Filled 2020-06-13: qty 10

## 2020-06-13 MED ORDER — SODIUM CHLORIDE 0.9 % IV SOLN
Freq: Once | INTRAVENOUS | Status: AC
  Filled 2020-06-13: qty 250

## 2020-06-19 ENCOUNTER — Telehealth (HOSPITAL_COMMUNITY): Payer: Self-pay | Admitting: Cardiology

## 2020-06-19 MED ORDER — DAPAGLIFLOZIN PROPANEDIOL 10 MG PO TABS: ORAL_TABLET | ORAL | 3 refills | Status: DC

## 2020-06-19 NOTE — Telephone Encounter (Signed)
Pt request Farxiga 10 MG TAB refill, please send script to [facility], [place], [place]. Pt can be reached _[phone]. Thanks

## 2020-07-03 ENCOUNTER — Encounter: Payer: Self-pay | Admitting: Internal Medicine

## 2020-07-03 ENCOUNTER — Inpatient Hospital Stay (HOSPITAL_BASED_OUTPATIENT_CLINIC_OR_DEPARTMENT_OTHER): Payer: Medicare Other | Admitting: Internal Medicine

## 2020-07-03 ENCOUNTER — Inpatient Hospital Stay: Payer: Medicare Other

## 2020-07-03 ENCOUNTER — Other Ambulatory Visit: Payer: Self-pay | Admitting: Internal Medicine

## 2020-07-03 ENCOUNTER — Other Ambulatory Visit: Payer: Self-pay

## 2020-07-03 VITALS — BP 132/71 | HR 57 | Resp 16

## 2020-07-03 DIAGNOSIS — C49A3 Gastrointestinal stromal tumor of small intestine: Secondary | ICD-10-CM | POA: Diagnosis not present

## 2020-07-03 DIAGNOSIS — D509 Iron deficiency anemia, unspecified: Secondary | ICD-10-CM | POA: Diagnosis not present

## 2020-07-03 DIAGNOSIS — E611 Iron deficiency: Secondary | ICD-10-CM

## 2020-07-03 LAB — CBC WITH DIFFERENTIAL/PLATELET
Abs Immature Granulocytes: 0.02 10*3/uL (ref 0.00–0.07)
Basophils Absolute: 0 10*3/uL (ref 0.0–0.1)
Basophils Relative: 0 %
Eosinophils Absolute: 0 10*3/uL (ref 0.0–0.5)
Eosinophils Relative: 0 %
HCT: 31.9 % — ABNORMAL LOW (ref 36.0–46.0)
Hemoglobin: 9.9 g/dL — ABNORMAL LOW (ref 12.0–15.0)
Immature Granulocytes: 0 %
Lymphocytes Relative: 7 %
Lymphs Abs: 0.4 10*3/uL — ABNORMAL LOW (ref 0.7–4.0)
MCH: 28.9 pg (ref 26.0–34.0)
MCHC: 31 g/dL (ref 30.0–36.0)
MCV: 93 fL (ref 80.0–100.0)
Monocytes Absolute: 0.4 10*3/uL (ref 0.1–1.0)
Monocytes Relative: 5 %
Neutro Abs: 5.8 10*3/uL (ref 1.7–7.7)
Neutrophils Relative %: 88 %
Platelets: 270 10*3/uL (ref 150–400)
RBC: 3.43 MIL/uL — ABNORMAL LOW (ref 3.87–5.11)
RDW: 17.3 % — ABNORMAL HIGH (ref 11.5–15.5)
WBC: 6.7 10*3/uL (ref 4.0–10.5)
nRBC: 0 % (ref 0.0–0.2)

## 2020-07-03 LAB — COMPREHENSIVE METABOLIC PANEL
ALT: 13 U/L (ref 0–44)
AST: 24 U/L (ref 15–41)
Albumin: 3.9 g/dL (ref 3.5–5.0)
Alkaline Phosphatase: 55 U/L (ref 38–126)
Anion gap: 9 (ref 5–15)
BUN: 24 mg/dL — ABNORMAL HIGH (ref 8–23)
CO2: 25 mmol/L (ref 22–32)
Calcium: 8.8 mg/dL — ABNORMAL LOW (ref 8.9–10.3)
Chloride: 104 mmol/L (ref 98–111)
Creatinine, Ser: 0.85 mg/dL (ref 0.44–1.00)
GFR, Estimated: >60 mL/min (ref >60)
Glucose, Bld: 118 mg/dL — ABNORMAL HIGH (ref 70–99)
Potassium: 4.1 mmol/L (ref 3.5–5.1)
Sodium: 138 mmol/L (ref 135–145)
Total Bilirubin: 0.7 mg/dL (ref 0.3–1.2)
Total Protein: 7.1 g/dL (ref 6.5–8.1)

## 2020-07-03 MED ORDER — SODIUM CHLORIDE 0.9 % IV SOLN
Freq: Once | INTRAVENOUS | Status: AC
Start: 2020-07-03 — End: 2020-07-03
  Filled 2020-07-03: qty 250

## 2020-07-03 MED ORDER — IMATINIB MESYLATE 100 MG PO TABS: 400.0000 mg | ORAL_TABLET | Freq: Every day | ORAL | 1 refills | Status: DC

## 2020-07-03 MED ORDER — IRON SUCROSE 20 MG/ML IV SOLN
200.0000 mg | Freq: Once | INTRAVENOUS | Status: AC
Start: 2020-07-03 — End: 2020-07-03
  Administered 2020-07-03: 200 mg via INTRAVENOUS
  Filled 2020-07-03: qty 10

## 2020-07-03 NOTE — Progress Notes (Signed)
[place] NOTE  Patient Care Team: Albina Billet, MD as PCP - General (Unknown Physician Specialty) Wellington Hampshire, MD as PCP - Cardiology (Cardiology) Christene Lye, MD (General Surgery) Albina Billet, MD (Internal Medicine) Delana Meyer, Dolores Lory, MD (Vascular Surgery) Wilford Corner, MD as Consulting Physician (Gastroenterology) Cammie Sickle, MD as Consulting Physician (Hematology and Oncology)  CHIEF COMPLAINTS/PURPOSE OF CONSULTATION: Anemia   HEMATOLOGY HISTORY  Oncology History Overview Note  NOV 2021- CT Bx- TUMOR  Tumor Site: Peritoneum, jejunal  Histologic Type: Gastrointestinal stromal tumor, spindle cell type  Mitotic Rate: 1 per 5 mm squared  Histologic Grade: G1, low grade (mitotic rate less than or equal to 5  per 5 mm squared)- STAGE T2N1 [possible mesneteric LN]; CT A/P-9.8 x9.8 cm  # NOV 29th 2021- GLEEVEC 400 mg/day.     # CHRONIC ANEMIA EGD/capsule-WNL-2018 [Dr.vanga]-UGIB ; colonoscopy-2018 [Elliot] ; capsule-not done; hemoglobin around 8.8; iron saturation 5% [Dr.Tate].  Poor tolerance of p.o. 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   GIST (gastrointestinal stromal tumor) of small bowel, malignant ([place])  03/12/2020 Initial Diagnosis   GIST (gastrointestinal stromal tumor) of small bowel, malignant (HCC)      HISTORY OF PRESENTING ILLNESS:  Susan Barajas 75 y.o.  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  Patient admits to more energy post IV iron infusion.  Denies any blood in stools or black-colored stools.  However continues to have mild to moderate fatigue.  No worsening skin rash or itch.  No diarrhea.  No swelling the legs.  Review of Systems  Constitutional: Positive for malaise/fatigue. Negative for chills, diaphoresis, fever and weight loss.  HENT: Negative for nosebleeds and sore throat.   Eyes: Negative for double vision.  Respiratory: Positive for shortness of breath. Negative for cough, hemoptysis, sputum production and wheezing.   Cardiovascular: Negative for chest pain, palpitations, orthopnea and leg swelling.  Gastrointestinal: Negative for abdominal pain, blood in stool, constipation, diarrhea, heartburn, melena, nausea and vomiting.  Genitourinary: Negative for dysuria, frequency and urgency.  Musculoskeletal: Negative for back pain and joint pain.  Skin: Negative.  Negative for itching and rash.  Neurological: Negative for dizziness, tingling, focal weakness, weakness and headaches.  Endo/Heme/Allergies: Does not bruise/bleed easily.  Psychiatric/Behavioral: Negative for depression. The patient is not nervous/anxious and does not have insomnia.     MEDICAL HISTORY:  Past Medical History:  Diagnosis Date  . Asthma 1950  . Clotting disorder (HCC)    right leg  . Diabetes ([place]) Age 71   Type 2; insulin dependent  . GIST (gastrointestinal stromal tumor) of small bowel, malignant ([place])   . Glaucoma 1949  . Hyperlipidemia   . Hypertension   . Obesity, unspecified   . Personal history of tobacco use, presenting hazards to health   . Varicose veins of lower extremities with other complications     SURGICAL HISTORY: Past Surgical History:  Procedure Laterality Date  . BREAST BIOPSY Right 1991  . BREAST BIOPSY Left 2013  . COLONOSCOPY  2010   Dr. Vira Agar  . COLONOSCOPY  Jan 2016   Dr Vira Agar  . ESOPHAGOGASTRODUODENOSCOPY N/A 11/26/2016   Procedure:  ESOPHAGOGASTRODUODENOSCOPY (EGD);  Surgeon: Lin Landsman, MD;  Location: [facility] ENDOSCOPY;  Service: Gastroenterology;  Laterality: N/A;  . GIVENS CAPSULE STUDY N/A 11/26/2016   Procedure: GIVENS CAPSULE STUDY, if EGD negative, plan to drop capsule with scope if EGD negative;  Surgeon: Lin Landsman, MD;  Location[facility] ENDOSCOPY;  Service: Gastroenterology;  Laterality: N/A;  . IVC FILTER INSERTION N/A 11/14/2016   Procedure: IVC Filter Insertion;  Surgeon: Katha Cabal, MD;  Location: [facility];  Service: Cardiovascular;  Laterality: N/A;  . RIGHT/LEFT HEART CATH AND CORONARY ANGIOGRAPHY N/A 06/30/2019   Procedure: RIGHT/LEFT HEART CATH AND CORONARY ANGIOGRAPHY;  Surgeon: Larey Dresser, MD;  Location: [facility];  Service: Cardiovascular;  Laterality: N/A;  . VEIN SURGERY Right 2006   Vein Closure Procedure; RF ablation of right GSV    SOCIAL HISTORY: Social History   Socioeconomic History  . Marital status: Single    Spouse name: Not on file  . Number of children: 2  . Years of education: Not on file  . Highest education level: Not on file  Occupational History  . Occupation: CELL    Employer: RETIRED  Tobacco Use  . Smoking status: Former Smoker    Packs/day: 1.50    Years: 20.00    Pack years: 30.00    Quit date: 1999    Years since quitting: 23.2  . Smokeless tobacco: Never Used  Vaping Use  . Vaping Use: Never used  Substance and Sexual Activity  . Alcohol use: No    Alcohol/week: 0.0 standard drinks  . Drug use: No  . Sexual activity: Not Currently  Other Topics Concern  . Not on file  Social History Narrative   Independent at baseline. Lives at home with her brother; in [place]. Quit smoking 20 years; no alcohol.    Social Determinants of Health   Financial Resource Strain: Not on file  Food Insecurity: Not on file  Transportation Needs: Not on file  Physical Activity: Not on file  Stress: Not on file  Social  Connections: Not on file  Intimate Partner Violence: Not on file    FAMILY HISTORY: Family History  Problem Relation Age of Onset  . Prostate cancer Father   . Other Sister        mouth cancer  . Hypertension Mother     ALLERGIES:  has no allergies on file.  MEDICATIONS:  Current Outpatient Medications  Medication Sig Dispense Refill  . ACCU-CHEK AVIVA PLUS test strip 1 each 2 (two) times daily.    Marland Kitchen acetaminophen (TYLENOL) 325 MG tablet Take 2 tablets (650 mg total) by mouth every 6 (six) hours as needed for mild pain (or Fever >/= 101).    Marland Kitchen aspirin 81 MG chewable tablet Chew 1 tablet (81 mg total) by mouth daily. 30 tablet 0  . dapagliflozin propanediol (FARXIGA) 10 MG TABS tablet TAKE 1 TABLET BY MOUTH ONCE DAILY 30 tablet 3  . ferrous gluconate (FERGON) 325 MG tablet Take 325 mg by mouth in the morning and at bedtime.     . furosemide (LASIX) 40 MG tablet Take 1 tablet (40 mg total) by mouth in the morning AND 0.5 tablets (20 mg total) every evening. 45 tablet  3  . HUMALOG KWIKPEN 100 UNIT/ML KiwkPen Inject 10 Units into the skin 2 (two) times daily.     . insulin glargine (LANTUS) 100 UNIT/ML injection Inject 32 Units into the skin daily.     Marland Kitchen latanoprost (XALATAN) 0.005 % ophthalmic solution Place 1 drop into both eyes at bedtime.     . lovastatin (MEVACOR) 20 MG tablet Take 20 mg by mouth every evening.     . metFORMIN (GLUCOPHAGE) 1000 MG tablet Take 1,000 mg by mouth daily.     . Multiple Vitamin (MULTIVITAMIN) tablet Take 1 tablet by mouth daily.    Marland Kitchen omeprazole (PRILOSEC) 40 MG capsule Take 40 mg by mouth daily.    . ondansetron (ZOFRAN) 8 MG tablet One pill every 8 hours as needed for nausea/vomitting. 45 tablet 1  . OPSUMIT 10 MG tablet Take 1 tablet daily. 30 tablet 11  . potassium chloride SA (KLOR-CON) 20 MEQ tablet Take 1 tablet (20 mEq total) by mouth daily. 90 tablet 3  . STIOLTO RESPIMAT 2.5-2.5 MCG/ACT AERS INHALE 2 PUFFS BY MOUTH ONCE DAILY 4 g 5  .  tadalafil, PAH, (ALYQ) 20 MG tablet Take 2 tablets (40 mg total) by mouth daily. 60 tablet 11  . timolol (TIMOPTIC) 0.25 % ophthalmic solution Place 1 drop into both eyes 2 (two) times daily.     Marland Kitchen imatinib (GLEEVEC) 100 MG tablet Take 4 tablets (400 mg total) by mouth daily. Take with meals and large glass of water 120 tablet 1   No current facility-administered medications for this visit.      PHYSICAL EXAMINATION:   Vitals:   07/03/20 1058  BP: 125/60  Pulse: 88  Resp: 20  Temp: (!) 97.5 F (36.4 C)  SpO2: 97%   Filed Weights   07/03/20 1058  Weight: 200 lb 12.8 oz (91.1 kg)    Physical Exam Constitutional:      Comments: 6 L of oxygen nasal cannula.  Walk independently. She is alone.  HENT:     Head: Normocephalic and atraumatic.     Mouth/Throat:     Pharynx: No oropharyngeal exudate.  Eyes:     Pupils: Pupils are equal, round, and reactive to light.  Cardiovascular:     Rate and Rhythm: Normal rate and regular rhythm.  Pulmonary:     Effort: No respiratory distress.     Breath sounds: No wheezing.     Comments: Decreased breath sounds. Abdominal:     General: Bowel sounds are normal. There is no distension.     Palpations: Abdomen is soft. There is no mass.     Tenderness: There is no abdominal tenderness. There is no guarding or rebound.  Musculoskeletal:        General: No tenderness. Normal range of motion.     Cervical back: Normal range of motion and neck supple.  Skin:    General: Skin is warm.  Neurological:     Mental Status: She is alert and oriented to person, place, and time.  Psychiatric:        Mood and Affect: Affect normal.     LABORATORY DATA:  I have reviewed the data as listed Lab Results  Component Value Date   WBC 6.7 07/03/2020   HGB 9.9 (L) 07/03/2020   HCT 31.9 (L) 07/03/2020   MCV 93.0 07/03/2020   PLT 270 07/03/2020   Recent Labs    08/15/19 1450 08/23/19 1618 10/28/19 1213 02/10/20 1246 05/08/20 1022  06/05/20 1031 07/03/20 1021  NA 136 139 138   < > 142 143 138  K 4.3 4.3 4.2   < > 4.0 3.9 4.1  CL 102 103 107   < > 107 108 104  CO2 _0 < > _1 GLUCOSE 202* 140* 103*   < > 81 118* 118*  BUN _2 < > 20 29* 24*  CREATININE 0.95 0.88 0.78   < > 0.81 0.91 0.85  CALCIUM 8.6* 9.2 8.6*   < > 8.6* 8.6* 8.8*  GFRNONAA 59* >60 >60   < > >60 >60 >60  GFRAA >60 >60 >60  --   --   --   --   PROT 6.8  --   --    < > 6.8 7.1 7.1  ALBUMIN 3.7  --   --    < > 3.8 3.7 3.9  AST 17  --   --    < > _3 ALT 13  --   --    < > _4 ALKPHOS 51  --   --    < > 51 53 55  BILITOT 0.5  --   --    < > 0.2* 0.4 0.7   < > = values in this interval not displayed.     No results found.  GIST (gastrointestinal stromal tumor) of small bowel, malignant ([place]) # GIST of small bowel-low grade STAGE II [T3N0] vs  IV [N1] [low Mitotic index; T=9.8cm- T3; N1- ? mesneteric LN] [s/p IR biopsy]. Localized disease on Gleevec 400 milligrams [nov 28th 2021].FEB 17th, 2022-  PET scan shows significant improvement of the FDG activity; however slight shrinkage of the tumor. PLAN 6 months of neoadjuvant Gleevec. Discussed with Dr.Byrnett.   # Patient tolerating Gleevec very well. Continue Gleevec for now. Will repeat scan again in end of May 2022. Refilled Gleevec.   #CHF/COPD home O2 4-6Lits;-borderline respiratory status-STABLE  #Iron deficient anemia-  sec to small bowel GIST.  Today hemoglobin is  9.7;    Proceed with Venofer today.  # DISPOSITION:.   # Venofer TODAY;  # Follow up in 4 weeks-  MD; labs-cbc/cmp;possible venofer;  Dr.B     All questions were answered. The patient knows to call the clinic with any problems, questions or concerns.    Cammie Sickle, MD 07/03/2020 11:18 AM

## 2020-07-03 NOTE — Assessment & Plan Note (Signed)
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 mesneteric LN] [s/p IR biopsy]. Localized disease on Gleevec 400 milligrams [nov 28th 2021].FEB 17th, 2022-  PET scan shows significant improvement of the FDG activity; however slight shrinkage of the tumor. PLAN 6 months of neoadjuvant Gleevec. Discussed with Dr.Byrnett.   # Patient tolerating Gleevec very well. Continue Gleevec for now. Will repeat scan again in end of May 2022. Refilled Gleevec.   #CHF/COPD home O2 4-6Lits;-borderline respiratory status-STABLE  #Iron deficient anemia-  sec to small bowel GIST.  Today hemoglobin is  9.7;    Proceed with Venofer today.  # DISPOSITION:.   # Venofer TODAY;  # Follow up in 4 weeks-  MD; labs-cbc/cmp;possible venofer;  Dr.B

## 2020-07-05 MED FILL — IMATINIB MESYLATE 100 MG TA: 100 | 30 days supply | Qty: 120 | Fill #0

## 2020-07-06 ENCOUNTER — Other Ambulatory Visit: Payer: Self-pay

## 2020-07-06 ENCOUNTER — Ambulatory Visit (HOSPITAL_COMMUNITY)
Admission: RE | Admit: 2020-07-06 | Discharge: 2020-07-06 | Disposition: A | Discharge status: Home / Self Care | Payer: Medicare Other | Source: Ambulatory Visit | Attending: Cardiology | Admitting: Cardiology

## 2020-07-06 ENCOUNTER — Encounter (HOSPITAL_COMMUNITY): Payer: Self-pay | Admitting: Cardiology

## 2020-07-06 VITALS — BP 122/70 | HR 78 | Wt 199.6 lb

## 2020-07-06 DIAGNOSIS — J449 Chronic obstructive pulmonary disease, unspecified: Secondary | ICD-10-CM | POA: Diagnosis not present

## 2020-07-06 DIAGNOSIS — Z8616 Personal history of COVID-19: Secondary | ICD-10-CM | POA: Diagnosis not present

## 2020-07-06 DIAGNOSIS — Z794 Long term (current) use of insulin: Secondary | ICD-10-CM | POA: Diagnosis not present

## 2020-07-06 DIAGNOSIS — J9611 Chronic respiratory failure with hypoxia: Secondary | ICD-10-CM | POA: Insufficient documentation

## 2020-07-06 DIAGNOSIS — I272 Pulmonary hypertension, unspecified: Secondary | ICD-10-CM

## 2020-07-06 DIAGNOSIS — Z7982 Long term (current) use of aspirin: Secondary | ICD-10-CM | POA: Diagnosis not present

## 2020-07-06 DIAGNOSIS — E119 Type 2 diabetes mellitus without complications: Secondary | ICD-10-CM | POA: Insufficient documentation

## 2020-07-06 DIAGNOSIS — I110 Hypertensive heart disease with heart failure: Secondary | ICD-10-CM | POA: Diagnosis not present

## 2020-07-06 DIAGNOSIS — I50810 Right heart failure, unspecified: Secondary | ICD-10-CM

## 2020-07-06 DIAGNOSIS — I2721 Secondary pulmonary arterial hypertension: Secondary | ICD-10-CM | POA: Diagnosis not present

## 2020-07-06 DIAGNOSIS — Z79899 Other long term (current) drug therapy: Secondary | ICD-10-CM | POA: Diagnosis not present

## 2020-07-06 DIAGNOSIS — Z9981 Dependence on supplemental oxygen: Secondary | ICD-10-CM | POA: Insufficient documentation

## 2020-07-06 DIAGNOSIS — Z87891 Personal history of nicotine dependence: Secondary | ICD-10-CM | POA: Insufficient documentation

## 2020-07-06 DIAGNOSIS — Z8249 Family history of ischemic heart disease and other diseases of the circulatory system: Secondary | ICD-10-CM | POA: Diagnosis not present

## 2020-07-06 DIAGNOSIS — Z86718 Personal history of other venous thrombosis and embolism: Secondary | ICD-10-CM | POA: Insufficient documentation

## 2020-07-06 HISTORY — DX: Heart failure, unspecified: I50.9

## 2020-07-06 LAB — BASIC METABOLIC PANEL
Anion gap: 6 (ref 5–15)
BUN: 14 mg/dL (ref 8–23)
CO2: 27 mmol/L (ref 22–32)
Calcium: 8.9 mg/dL (ref 8.9–10.3)
Chloride: 106 mmol/L (ref 98–111)
Creatinine, Ser: 0.93 mg/dL (ref 0.44–1.00)
GFR, Estimated: >60 mL/min (ref >60)
Glucose, Bld: 64 mg/dL — ABNORMAL LOW (ref 70–99)
Potassium: 4.2 mmol/L (ref 3.5–5.1)
Sodium: 139 mmol/L (ref 135–145)

## 2020-07-06 LAB — BRAIN NATRIURETIC PEPTIDE: B Natriuretic Peptide: 88.8 pg/mL (ref 0.0–100.0)

## 2020-07-06 NOTE — Patient Instructions (Signed)
Labs done today, we will call you for abnormal results  Your physician recommends that you schedule a follow-up appointment in: 2 months with echocardiogram  If you have any questions or concerns before your next appointment please send [name] a message through [place] or call our office at [phone].    TO LEAVE A MESSAGE FOR THE NURSE SELECT OPTION 2, PLEASE LEAVE A MESSAGE INCLUDING: . YOUR NAME . DATE OF BIRTH . CALL BACK NUMBER . REASON FOR CALL**this is important as we prioritize the call backs  [place] AS LONG AS YOU CALL BEFORE 4:00 PM  At the [facility], you and your health needs are our priority. As part of our continuing mission to provide you with exceptional heart care, we have created designated Provider Care Teams. These Care Teams include your primary Cardiologist (physician) and Advanced Practice Providers (APPs- Physician Assistants and Nurse Practitioners) who all work together to provide you with the care you need, when you need it.   You may see any of the following providers on your designated Care Team at your next follow up: Marland Kitchen Dr Glori Bickers . Dr Loralie Champagne . Dr Vickki Muff . Darrick Grinder, NP . Lyda Jester, [place] . Audry Riles, PharmD   Please be sure to bring in all your medications bottles to every appointment.

## 2020-07-06 NOTE — Progress Notes (Signed)
   I performed 6 min walk test with Susan Barajas.  She wore 6 Liters of oxygen via nasal cannula for the entirety of the walk.  She walked 980 feet with heart rate ranging between 89 bpm and 135 bpm.  Oxygen saturations ranged from 97% to 74%.  She returned to the room after walk and quickly oxygen saturations came back up to 93% on 6L.

## 2020-07-08 NOTE — Progress Notes (Signed)
PCP: Albina Billet, MD Cardiology: Dr. Fletcher Anon HF Cardiology: Dr. Aundra Dubin  75 y.o. with history of chronic hypoxemic respiratory failure, OSA, and chronic diastolic CHF was referred by Dr. Fletcher Anon for evaluation of CHF and pulmonary hypertension.  Patient has been on home oxygen for 2-3 years per her report.  She was a smoker remotely. She had a DVT in 2018.  Anticoagulation was stopped due to refractory anemia and IVC filter was placed.  CT chest did not show a PE. She has OSA and uses CPAP.  Last echo in 1/21 showed normal LV EF with PASP 80 and severely dilated RV with mild RV dysfunction and D-shaped septum. She has been on Lasix for diuresis due to RV failure.    RHC/LHC was done in 3/21, showing mild CAD; mean RA 12, PA 68/25 mean 42, mean PCWP 9, CI 4.1, PVR 4.13 WU.  V/Q scan in 3/21 did not show evidence for chronic PE.  High resolution chest CT in 3/21 did not show evidence for ILD, it did show mild-moderate emphysema. PFTs (3/21) showed moderate-severe COPD.   She has been diagnosed with GI stromal tumor and is now on [place].   She returns for followup of pulmonary hypertension and RV failure.  Wearing home oxygen, 6L.  No dyspnea walking on flat ground.  6 minute walk improved today. No lightheadedness/dizziness.  No chest pain.  Feels like she is doing well on Opsumit and tadalafil.      6 minute walk (3/21): 182 m 6 minute walk (5/21): 243 m, oxygen saturation dropped to as low as 70s 6 minute walk (7/21): Only walked 3 min bc oxygen saturation dropped to 70s and CMA stopped walk, she went 121 m.  6 minute walk (3/22): 298 m  Labs (12/20): BNP 819 Labs (2/21): K 4.4, creatinine 0.72 Labs (3/21): RF normal, ANA negative, anti-Jo-1 negative, anti-SCL-70 negative Labs (5/21): K 4.3, creatinine 0.88 Labs (6/21): hgb 10  PMH: 1. COVID-19 PNA 8/20.  2. Type 2 diabetes 3. DVT: right leg, 2018.  - Has IVC filter, not anticoagulated due to h/o anemia.  4. HTN 5. Hyperlipidemia.  6.  OSA: Uses Bipap.  7. COPD: Chronic hypoxemic respiratory failure, 3 L home oxygen.  8. Hyponatremia on HCTZ 9. Chronic diastolic CHF:  - Lexiscan Cardiolite in 2/19 was low risk no ischemia.  - Echo (1/21): EF 65-70%, no LVH, trivial MR, PASP 80 mmHg, severe RV enlargement with mildly decreased RV systolic function, D-shaped interventricular septum, dilated IVC.  - CTA chest (8/18): No evidence for PE.  10. Pulmonary hypertension: RHC/LHC 3/21 with mild CAD; mean RA 12, PA 68/25 mean 42, mean PCWP 9, CI 4.1, PVR 4.13 WU.  - Serologic workup negative.  - V/Q scan (3/21): No chronic PE.  - HRCT (3/21): No ILD, mild-moderate COPD. - PFTs (3/21): moderate-severe COPD 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   Social History   Socioeconomic History  . Marital status: Single    Spouse name: Not on file  . Number of children: 2  . Years of education: Not on file  . Highest education level: Not on file  Occupational History  . Occupation: CELL    Employer: RETIRED  Tobacco Use  . Smoking status: Former Smoker    Packs/day: 1.50    Years: 20.00    Pack years: 30.00    Quit date: 1999    Years since quitting: 23.2  . Smokeless tobacco: Never Used  Vaping Use  . Vaping  Use: Never used  Substance and Sexual Activity  . Alcohol use: No    Alcohol/week: 0.0 standard drinks  . Drug use: No  . Sexual activity: Not Currently  Other Topics Concern  . Not on file  Social History Narrative   Independent at baseline. Lives at home with her brother; in [place]. Quit smoking 20 years; no alcohol.    Social Determinants of Health   Financial Resource Strain: Not on file  Food Insecurity: Not on file  Transportation Needs: Not on file  Physical Activity: Not on file  Stress: Not on file  Social Connections: Not on file  Intimate Partner Violence: Not on file   Family History  Problem Relation Age of Onset  . Prostate cancer Father   . Other Sister        mouth cancer  .  Hypertension Mother    ROS: All systems reviewed and negative except as per HPI.   Current Outpatient Medications  Medication Sig Dispense Refill  . ACCU-CHEK AVIVA PLUS test strip 1 each 2 (two) times daily.    Marland Kitchen acetaminophen (TYLENOL) 325 MG tablet Take 2 tablets (650 mg total) by mouth every 6 (six) hours as needed for mild pain (or Fever >/= 101).    Marland Kitchen aspirin 81 MG chewable tablet Chew 1 tablet (81 mg total) by mouth daily. 30 tablet 0  . dapagliflozin propanediol (FARXIGA) 10 MG TABS tablet TAKE 1 TABLET BY MOUTH ONCE DAILY 30 tablet 3  . ferrous gluconate (FERGON) 325 MG tablet Take 325 mg by mouth in the morning and at bedtime.     . furosemide (LASIX) 40 MG tablet Take 1 tablet (40 mg total) by mouth in the morning AND 0.5 tablets (20 mg total) every evening. 45 tablet 3  . HUMALOG KWIKPEN 100 UNIT/ML KiwkPen Inject 10 Units into the skin 2 (two) times daily.     Marland Kitchen imatinib (GLEEVEC) 100 MG tablet Take 4 tablets (400 mg total) by mouth daily. Take with meals and large glass of water 120 tablet 1  . insulin glargine (LANTUS) 100 UNIT/ML injection Inject 32 Units into the skin daily.     Marland Kitchen latanoprost (XALATAN) 0.005 % ophthalmic solution Place 1 drop into both eyes at bedtime.     . lovastatin (MEVACOR) 20 MG tablet Take 20 mg by mouth every evening.     . metFORMIN (GLUCOPHAGE) 1000 MG tablet Take 1,000 mg by mouth daily.     . Multiple Vitamin (MULTIVITAMIN) tablet Take 1 tablet by mouth daily.    Marland Kitchen omeprazole (PRILOSEC) 40 MG capsule Take 40 mg by mouth daily.    . ondansetron (ZOFRAN) 8 MG tablet One pill every 8 hours as needed for nausea/vomitting. 45 tablet 1  . OPSUMIT 10 MG tablet Take 1 tablet daily. 30 tablet 11  . potassium chloride SA (KLOR-CON) 20 MEQ tablet Take 1 tablet (20 mEq total) by mouth daily. 90 tablet 3  . STIOLTO RESPIMAT 2.5-2.5 MCG/ACT AERS INHALE 2 PUFFS BY MOUTH ONCE DAILY 4 g 5  . tadalafil, PAH, (ALYQ) 20 MG tablet Take 2 tablets (40 mg total) by mouth  daily. 60 tablet 11  . timolol (TIMOPTIC) 0.25 % ophthalmic solution Place 1 drop into both eyes 2 (two) times daily.      No current facility-administered medications for this encounter.   BP 122/70   Pulse 78   Wt 90.5 kg (199 lb 9.6 oz)   SpO2 100% Comment: 6 l n/c  BMI 33.22  kg/m  General: NAD Neck: No JVD, no thyromegaly or thyroid nodule.  Lungs: Clear to auscultation bilaterally with normal respiratory effort. CV: Nondisplaced PMI.  Heart regular S1/S2, no S3/S4, no murmur.  No peripheral edema.  No carotid bruit.  Normal pedal pulses.  Abdomen: Soft, nontender, no hepatosplenomegaly, no distention.  Skin: Intact without lesions or rashes.  Neurologic: Alert and oriented x 3.  Psych: Normal affect. Extremities: No clubbing or cyanosis.  HEENT: Normal.   Assessment/Plan: 1. RV failure: Echo in 1/21 showed significant RV dysfunction with severe pulmonary hypertension and D-shaped interventricular septum.  Based on RHC, it looks like RV failure is due to pulmonary arterial hypertension rather than LV failure (normal PCWP).  She is not volume overloaded on exam today.   - Continue current Lasix.  BMET today.   2. Pulmonary HTN: Echo in 1/21 with RV failure and estimate PA systolic pressure 80 mmHg.  RHC in 3/21 showed pulmonary arterial hypertension.  Serologic workup was negative.  V/Q scan showed no chronic PE.  HRCT showed no ILD, mild-moderate emphysema.  PFTs suggestive of moderate-severe COPD. 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  6 minute walk today was improved.      - Continue to use Bipap at night, oxygen during the day.  - Continue Opsumit 10 mg daily.  - Continue tadalafil 40 mg daily.  - Check BNP today.   - Echo at followup appt in 2 months.  If RV continues to look abnormal, reasonable to consider addition of Uptravi.   3. H/o DVT: 2018.  She has IVC filter and is not anticoagulated due to  h/o anemia.  4. Type 2 diabetes: She is now on Farxiga.  5. Chronic hypoxemic respiratory failure: Home oxygen.  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  Suspect pulmonary arterial hypertension also plays a role.  - Continue home oxygen.  Followup in 2 months with echo.   Loralie Champagne 07/08/2020

## 2020-07-12 ENCOUNTER — Other Ambulatory Visit (HOSPITAL_COMMUNITY): Payer: Self-pay

## 2020-07-24 ENCOUNTER — Other Ambulatory Visit (HOSPITAL_COMMUNITY): Payer: Self-pay

## 2020-07-27 ENCOUNTER — Other Ambulatory Visit (HOSPITAL_COMMUNITY): Payer: Self-pay

## 2020-07-27 MED FILL — Imatinib Mesylate Tab 100 MG (Base Equivalent): ORAL | 30 days supply | Qty: 120 | Fill #0 | Status: AC

## 2020-07-30 ENCOUNTER — Other Ambulatory Visit (HOSPITAL_COMMUNITY): Payer: Self-pay

## 2020-08-01 ENCOUNTER — Inpatient Hospital Stay: Payer: Medicare Other

## 2020-08-01 ENCOUNTER — Inpatient Hospital Stay: Payer: Medicare Other | Attending: Internal Medicine

## 2020-08-01 ENCOUNTER — Inpatient Hospital Stay (HOSPITAL_BASED_OUTPATIENT_CLINIC_OR_DEPARTMENT_OTHER): Payer: Medicare Other | Admitting: Internal Medicine

## 2020-08-01 ENCOUNTER — Encounter: Payer: Self-pay | Admitting: Internal Medicine

## 2020-08-01 VITALS — BP 136/67 | HR 64 | Temp 96.8°F | Resp 16

## 2020-08-01 VITALS — BP 121/62 | HR 75 | Temp 97.8°F | Resp 16 | Ht 65.0 in | Wt 200.0 lb

## 2020-08-01 DIAGNOSIS — Z79899 Other long term (current) drug therapy: Secondary | ICD-10-CM | POA: Insufficient documentation

## 2020-08-01 DIAGNOSIS — C49A3 Gastrointestinal stromal tumor of small intestine: Secondary | ICD-10-CM | POA: Diagnosis not present

## 2020-08-01 DIAGNOSIS — D509 Iron deficiency anemia, unspecified: Secondary | ICD-10-CM | POA: Diagnosis not present

## 2020-08-01 DIAGNOSIS — M797 Fibromyalgia: Secondary | ICD-10-CM | POA: Diagnosis not present

## 2020-08-01 DIAGNOSIS — E611 Iron deficiency: Secondary | ICD-10-CM

## 2020-08-01 DIAGNOSIS — J449 Chronic obstructive pulmonary disease, unspecified: Secondary | ICD-10-CM | POA: Diagnosis not present

## 2020-08-01 DIAGNOSIS — I509 Heart failure, unspecified: Secondary | ICD-10-CM | POA: Insufficient documentation

## 2020-08-01 LAB — CBC WITH DIFFERENTIAL/PLATELET
Abs Immature Granulocytes: 0.02 10*3/uL (ref 0.00–0.07)
Basophils Absolute: 0 10*3/uL (ref 0.0–0.1)
Basophils Relative: 1 %
Eosinophils Absolute: 0 10*3/uL (ref 0.0–0.5)
Eosinophils Relative: 0 %
HCT: 36.2 % (ref 36.0–46.0)
Hemoglobin: 11.6 g/dL — ABNORMAL LOW (ref 12.0–15.0)
Immature Granulocytes: 0 %
Lymphocytes Relative: 11 %
Lymphs Abs: 0.6 10*3/uL — ABNORMAL LOW (ref 0.7–4.0)
MCH: 29 pg (ref 26.0–34.0)
MCHC: 32 g/dL (ref 30.0–36.0)
MCV: 90.5 fL (ref 80.0–100.0)
Monocytes Absolute: 0.4 10*3/uL (ref 0.1–1.0)
Monocytes Relative: 7 %
Neutro Abs: 5 10*3/uL (ref 1.7–7.7)
Neutrophils Relative %: 81 %
Platelets: 242 10*3/uL (ref 150–400)
RBC: 4 MIL/uL (ref 3.87–5.11)
RDW: 15.8 % — ABNORMAL HIGH (ref 11.5–15.5)
WBC: 6.1 10*3/uL (ref 4.0–10.5)
nRBC: 0 % (ref 0.0–0.2)

## 2020-08-01 LAB — COMPREHENSIVE METABOLIC PANEL
ALT: 14 U/L (ref 0–44)
AST: 19 U/L (ref 15–41)
Albumin: 4 g/dL (ref 3.5–5.0)
Alkaline Phosphatase: 55 U/L (ref 38–126)
Anion gap: 9 (ref 5–15)
BUN: 20 mg/dL (ref 8–23)
CO2: 27 mmol/L (ref 22–32)
Calcium: 9.2 mg/dL (ref 8.9–10.3)
Chloride: 102 mmol/L (ref 98–111)
Creatinine, Ser: 0.93 mg/dL (ref 0.44–1.00)
GFR, Estimated: >60 mL/min (ref >60)
Glucose, Bld: 72 mg/dL (ref 70–99)
Potassium: 3.9 mmol/L (ref 3.5–5.1)
Sodium: 138 mmol/L (ref 135–145)
Total Bilirubin: 0.4 mg/dL (ref 0.3–1.2)
Total Protein: 7.6 g/dL (ref 6.5–8.1)

## 2020-08-01 MED ORDER — IRON SUCROSE 20 MG/ML IV SOLN
200.0000 mg | Freq: Once | INTRAVENOUS | Status: AC
  Administered 2020-08-01: 200 mg via INTRAVENOUS
  Filled 2020-08-01: qty 10

## 2020-08-01 MED ORDER — SODIUM CHLORIDE 0.9 % IV SOLN
Freq: Once | INTRAVENOUS | Status: AC
  Filled 2020-08-01: qty 250

## 2020-08-01 NOTE — Progress Notes (Signed)
Expressed having some joint pain for a week.

## 2020-08-01 NOTE — Patient Instructions (Signed)
#  Recommend taking calcium plus vitamin D [1000 units each] once a day/over-the-counter

## 2020-08-01 NOTE — Assessment & Plan Note (Addendum)
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 mesneteric LN] [s/p IR biopsy]. Localized disease on Gleevec 400 milligrams [nov 28th 2021].FEB 17th, 2022-  PET scan shows significant improvement of the FDG activity; however slight shrinkage of the tumor. PLAN 6 months of neoadjuvant Gleevec-until June 2022.  # Patient tolerating Gleevec very well. Continue Gleevec for now. Refilled Gleevec.  We will get a PET scan in approximately 5 to 6 weeks.  We will plan surgery if continued response noted.  #CHF/COPD home O2 4-6Lits;-borderline respiratory status-STABLE  #Iron deficient anemia-  sec to small bowel GIST.  Today hemoglobin is  11.3;    Proceed with Venofer today.  # Myalgias- recommend ca+vit D; tylenol as needed.   # DISPOSITION:.   # Venofer TODAY;  # Follow up June 6th week--  MD; labs-cbc/cmp;possible venofer;prior PET  Dr.B

## 2020-08-01 NOTE — Progress Notes (Signed)
[place] NOTE  Patient Care Team: Albina Billet, MD as PCP - General (Unknown Physician Specialty) Wellington Hampshire, MD as PCP - Cardiology (Cardiology) Christene Lye, MD (General Surgery) Albina Billet, MD (Internal Medicine) Delana Meyer, Dolores Lory, MD (Vascular Surgery) Wilford Corner, MD as Consulting Physician (Gastroenterology) Cammie Sickle, MD as Consulting Physician (Hematology and Oncology)  CHIEF COMPLAINTS/PURPOSE OF CONSULTATION: Anemia   HEMATOLOGY HISTORY  Oncology History Overview Note  NOV 2021- CT Bx- TUMOR  Tumor Site: Peritoneum, jejunal  Histologic Type: Gastrointestinal stromal tumor, spindle cell type  Mitotic Rate: 1 per 5 mm squared  Histologic Grade: G1, low grade (mitotic rate less than or equal to 5  per 5 mm squared)- STAGE T2N1 [possible mesneteric LN]; CT A/P-9.8 x9.8 cm  # NOV 29th 2021- GLEEVEC 400 mg/day.     # CHRONIC ANEMIA EGD/capsule-WNL-2018 [Dr.vanga]-UGIB ; colonoscopy-2018 [Elliot] ; capsule-not done; hemoglobin around 8.8; iron saturation 5% [Dr.Tate].  Poor tolerance of p.o. 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   GIST (gastrointestinal stromal tumor) of small bowel, malignant ([place])  03/12/2020 Initial Diagnosis   GIST (gastrointestinal stromal tumor) of small bowel, malignant (H[facility])      HISTORY OF PRESENTING ILLNESS:  Susan Barajas 75 y.o.  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  Patient denies any blood in stools or black or stools.  Mild to moderate fatigue.  Complains of mild joint pains.  No swelling in the legs.  No diarrhea.   Review of Systems  Constitutional: Positive for malaise/fatigue. Negative for chills, diaphoresis, fever and weight loss.  HENT: Negative for nosebleeds and sore throat.   Eyes: Negative for double vision.  Respiratory: Positive for shortness of breath. Negative for cough, hemoptysis, sputum production and wheezing.   Cardiovascular: Negative for chest pain, palpitations, orthopnea and leg swelling.  Gastrointestinal: Negative for abdominal pain, blood in stool, constipation, diarrhea, heartburn, melena, nausea and vomiting.  Genitourinary: Negative for dysuria, frequency and urgency.  Musculoskeletal: Negative for back pain and joint pain.  Skin: Negative.  Negative for itching and rash.  Neurological: Negative for dizziness, tingling, focal weakness, weakness and headaches.  Endo/Heme/Allergies: Does not bruise/bleed easily.  Psychiatric/Behavioral: Negative for depression. The patient is not nervous/anxious and does not have insomnia.     MEDICAL HISTORY:  Past Medical History:  Diagnosis Date  . Asthma 1950  . CHF (congestive heart failure) ([place])   . Clotting disorder (HCC)    right leg  . Diabetes ([place]) Age 23   Type 2; insulin dependent  . GIST (gastrointestinal stromal tumor) of small bowel, malignant ([place])   . Glaucoma 1949  . Hyperlipidemia   . Hypertension   . Obesity, unspecified   . Personal history of tobacco use, presenting hazards to health   . Varicose veins of lower extremities with other complications     SURGICAL HISTORY: Past Surgical History:  Procedure Laterality  Date  . BREAST BIOPSY Right 1991  . BREAST BIOPSY Left 2013  . COLONOSCOPY  2010   Dr. Vira Agar  . COLONOSCOPY  Jan 2016   Dr Vira Agar  . ESOPHAGOGASTRODUODENOSCOPY N/A 11/26/2016   Procedure: ESOPHAGOGASTRODUODENOSCOPY (EGD);   Surgeon: Lin Landsman, MD;  Location: [facility] ENDOSCOPY;  Service: Gastroenterology;  Laterality: N/A;  . GIVENS CAPSULE STUDY N/A 11/26/2016   Procedure: GIVENS CAPSULE STUDY, if EGD negative, plan to drop capsule with scope if EGD negative;  Surgeon: Lin Landsman, MD;  Location: [facility] ENDOSCOPY;  Service: Gastroenterology;  Laterality: N/A;  . IVC FILTER INSERTION N/A 11/14/2016   Procedure: IVC Filter Insertion;  Surgeon: Katha Cabal, MD;  Location: [place] CV LAB;  Service: Cardiovascular;  Laterality: N/A;  . RIGHT/LEFT HEART CATH AND CORONARY ANGIOGRAPHY N/A 06/30/2019   Procedure: RIGHT/LEFT HEART CATH AND CORONARY ANGIOGRAPHY;  Surgeon: Larey Dresser, MD;  Location: [place] CV LAB;  Service: Cardiovascular;  Laterality: N/A;  . VEIN SURGERY Right 2006   Vein Closure Procedure; RF ablation of right GSV    SOCIAL HISTORY: Social History   Socioeconomic History  . Marital status: Single    Spouse name: Not on file  . Number of children: 2  . Years of education: Not on file  . Highest education level: Not on file  Occupational History  . Occupation: CELL    Employer: RETIRED  Tobacco Use  . Smoking status: Former Smoker    Packs/day: 1.50    Years: 20.00    Pack years: 30.00    Quit date: 1999    Years since quitting: 23.3  . Smokeless tobacco: Never Used  Vaping Use  . Vaping Use: Never used  Substance and Sexual Activity  . Alcohol use: No    Alcohol/week: 0.0 standard drinks  . Drug use: No  . Sexual activity: Not Currently  Other Topics Concern  . Not on file  Social History Narrative   Independent at baseline. Lives at home with her brother; in [place]. Quit smoking 20 years; no alcohol.    Social Determinants of Health   Financial Resource Strain: Not on file  Food Insecurity: Not on file  Transportation Needs: Not on file  Physical Activity: Not on file  Stress: Not on file  Social Connections: Not on file  Intimate Partner  Violence: Not on file    FAMILY HISTORY: Family History  Problem Relation Age of Onset  . Prostate cancer Father   . Other Sister        mouth cancer  . Hypertension Mother     ALLERGIES:  has No Known Allergies.  MEDICATIONS:  Current Outpatient Medications  Medication Sig Dispense Refill  . ACCU-CHEK AVIVA PLUS test strip 1 each 2 (two) times daily.    Marland Kitchen acetaminophen (TYLENOL) 325 MG tablet Take 2 tablets (650 mg total) by mouth every 6 (six) hours as needed for mild pain (or Fever >/= 101).    Marland Kitchen aspirin 81 MG chewable tablet Chew 1 tablet (81 mg total) by mouth daily. 30 tablet 0  . dapagliflozin propanediol (FARXIGA) 10 MG TABS tablet TAKE 1 TABLET BY MOUTH ONCE DAILY 30 tablet 3  . ferrous gluconate (FERGON) 325 MG tablet Take 325 mg by mouth in the morning and at bedtime.     . furosemide (LASIX) 40 MG tablet Take 1 tablet (40 mg total) by mouth in the morning AND 0.5 tablets (20 mg total) every evening. 45 tablet 3  .  HUMALOG KWIKPEN 100 UNIT/ML KiwkPen Inject 10 Units into the skin 2 (two) times daily.     Marland Kitchen imatinib (GLEEVEC) 100 MG tablet TAKE 4 TABLETS (400 MG TOTAL) BY MOUTH DAILY. TAKE WITH MEALS AND LARGE GLASS OF WATER 120 tablet 1  . insulin glargine (LANTUS) 100 UNIT/ML injection Inject 32 Units into the skin daily.     Marland Kitchen latanoprost (XALATAN) 0.005 % ophthalmic solution Place 1 drop into both eyes at bedtime.     . lovastatin (MEVACOR) 20 MG tablet Take 20 mg by mouth every evening.     . metFORMIN (GLUCOPHAGE) 1000 MG tablet Take 1,000 mg by mouth daily.     . Multiple Vitamin (MULTIVITAMIN) tablet Take 1 tablet by mouth daily.    Marland Kitchen omeprazole (PRILOSEC) 40 MG capsule Take 40 mg by mouth daily.    . ondansetron (ZOFRAN) 8 MG tablet One pill every 8 hours as needed for nausea/vomitting. 45 tablet 1  . OPSUMIT 10 MG tablet Take 1 tablet daily. 30 tablet 11  . potassium chloride SA (KLOR-CON) 20 MEQ tablet Take 1 tablet (20 mEq total) by mouth daily. 90 tablet 3  .  STIOLTO RESPIMAT 2.5-2.5 MCG/ACT AERS INHALE 2 PUFFS BY MOUTH ONCE DAILY 4 g 5  . tadalafil, PAH, (ALYQ) 20 MG tablet Take 2 tablets (40 mg total) by mouth daily. 60 tablet 11  . timolol (TIMOPTIC) 0.25 % ophthalmic solution Place 1 drop into both eyes 2 (two) times daily.      No current facility-administered medications for this visit.      PHYSICAL EXAMINATION:   Vitals:   08/01/20 1308  BP: 121/62  Pulse: 75  Resp: 16  Temp: 97.8 F (36.6 C)  SpO2: 96%   Filed Weights   08/01/20 1308  Weight: 200 lb (90.7 kg)    Physical Exam Constitutional:      Comments: 6 L of oxygen nasal cannula.  Walk independently. She is alone.  HENT:     Head: Normocephalic and atraumatic.     Mouth/Throat:     Pharynx: No oropharyngeal exudate.  Eyes:     Pupils: Pupils are equal, round, and reactive to light.  Cardiovascular:     Rate and Rhythm: Normal rate and regular rhythm.  Pulmonary:     Effort: No respiratory distress.     Breath sounds: No wheezing.     Comments: Decreased breath sounds. Abdominal:     General: Bowel sounds are normal. There is no distension.     Palpations: Abdomen is soft. There is no mass.     Tenderness: There is no abdominal tenderness. There is no guarding or rebound.  Musculoskeletal:        General: No tenderness. Normal range of motion.     Cervical back: Normal range of motion and neck supple.  Skin:    General: Skin is warm.  Neurological:     Mental Status: She is alert and oriented to person, place, and time.  Psychiatric:        Mood and Affect: Affect normal.     LABORATORY DATA:  I have reviewed the data as listed Lab Results  Component Value Date   WBC 6.1 08/01/2020   HGB 11.6 (L) 08/01/2020   HCT 36.2 08/01/2020   MCV 90.5 08/01/2020   PLT 242 08/01/2020   Recent Labs    08/15/19 1450 08/23/19 1618 10/28/19 1213 02/10/20 1246 06/05/20 1031 07/03/20 1021 07/06/20 1113 08/01/20 1236  NA 136 139 138   < >  143 138 139  138  K 4.3 4.3 4.2   < > 3.9 4.1 4.2 3.9  CL 102 103 107   < > 108 104 106 102  CO2 _0 < > _1 GLUCOSE 202* 140* 103*   < > 118* 118* 64* 72  BUN _2 < > 29* 24* 14 20  CREATININE 0.95 0.88 0.78   < > 0.91 0.85 0.93 0.93  CALCIUM 8.6* 9.2 8.6*   < > 8.6* 8.8* 8.9 9.2  GFRNONAA 59* >60 >60   < > >60 >60 >60 >60  GFRAA >60 >60 >60  --   --   --   --   --   PROT 6.8  --   --    < > 7.1 7.1  --  7.6  ALBUMIN 3.7  --   --    < > 3.7 3.9  --  4.0  AST 17  --   --    < > 18 24  --  19  ALT 13  --   --    < > 14 13  --  14  ALKPHOS 51  --   --    < > 53 55  --  55  BILITOT 0.5  --   --    < > 0.4 0.7  --  0.4   < > = values in this interval not displayed.     No results found.  GIST (gastrointestinal stromal tumor) of small bowel, malignant ([place]) # GIST of small bowel-low grade STAGE II [T3N0] vs  IV [N1] [low Mitotic index; T=9.8cm- T3; N1- ? mesneteric LN] [s/p IR biopsy]. Localized disease on Gleevec 400 milligrams [nov 28th 2021].FEB 17th, 2022-  PET scan shows significant improvement of the FDG activity; however slight shrinkage of the tumor. PLAN 6 months of neoadjuvant Gleevec-until June 2022.  # Patient tolerating Gleevec very well. Continue Gleevec for now. Refilled Gleevec.  We will get a PET scan in approximately 5 to 6 weeks.  We will plan surgery if continued response noted.  #CHF/COPD home O2 4-6Lits;-borderline respiratory status-STABLE  #Iron deficient anemia-  sec to small bowel GIST.  Today hemoglobin is  11.3;    Proceed with Venofer today.  # Myalgias- recommend ca+vit D; tylenol as needed.   # DISPOSITION:.   # Venofer TODAY;  # Follow up June 6th week--  MD; labs-cbc/cmp;possible venofer;prior PET  Dr.B     All questions were answered. The patient knows to call the clinic with any problems, questions or concerns.    Cammie Sickle, MD 08/01/2020 2:22 PM

## 2020-08-20 ENCOUNTER — Other Ambulatory Visit (HOSPITAL_COMMUNITY): Payer: Self-pay

## 2020-08-20 ENCOUNTER — Other Ambulatory Visit: Payer: Self-pay | Admitting: Internal Medicine

## 2020-08-20 DIAGNOSIS — C49A3 Gastrointestinal stromal tumor of small intestine: Secondary | ICD-10-CM

## 2020-08-20 MED ORDER — IMATINIB MESYLATE 100 MG PO TABS
ORAL_TABLET | ORAL | 1 refills | Status: DC
  Filled 2020-08-27: qty 120, 30d supply, fill #0
  Filled 2020-09-21 – 2020-09-25 (×3): qty 120, 30d supply, fill #1

## 2020-08-27 ENCOUNTER — Other Ambulatory Visit (HOSPITAL_COMMUNITY): Payer: Self-pay

## 2020-09-04 ENCOUNTER — Ambulatory Visit (HOSPITAL_BASED_OUTPATIENT_CLINIC_OR_DEPARTMENT_OTHER)
Admission: RE | Admit: 2020-09-04 | Discharge: 2020-09-04 | Disposition: A | Discharge status: Home / Self Care | Payer: Medicare Other | Source: Ambulatory Visit | Attending: Cardiology | Admitting: Cardiology

## 2020-09-04 ENCOUNTER — Encounter (HOSPITAL_COMMUNITY): Payer: Self-pay | Admitting: Cardiology

## 2020-09-04 ENCOUNTER — Ambulatory Visit (HOSPITAL_COMMUNITY)
Admission: RE | Admit: 2020-09-04 | Discharge: 2020-09-04 | Disposition: A | Discharge status: Home / Self Care | Payer: Medicare Other | Source: Ambulatory Visit | Attending: Cardiology | Admitting: Cardiology

## 2020-09-04 ENCOUNTER — Other Ambulatory Visit: Payer: Self-pay

## 2020-09-04 VITALS — BP 132/52 | HR 59 | Wt 201.2 lb

## 2020-09-04 DIAGNOSIS — Z8616 Personal history of COVID-19: Secondary | ICD-10-CM | POA: Diagnosis not present

## 2020-09-04 DIAGNOSIS — I110 Hypertensive heart disease with heart failure: Secondary | ICD-10-CM | POA: Insufficient documentation

## 2020-09-04 DIAGNOSIS — Z7982 Long term (current) use of aspirin: Secondary | ICD-10-CM | POA: Diagnosis not present

## 2020-09-04 DIAGNOSIS — J9611 Chronic respiratory failure with hypoxia: Secondary | ICD-10-CM | POA: Diagnosis not present

## 2020-09-04 DIAGNOSIS — Z7984 Long term (current) use of oral hypoglycemic drugs: Secondary | ICD-10-CM | POA: Diagnosis not present

## 2020-09-04 DIAGNOSIS — G4733 Obstructive sleep apnea (adult) (pediatric): Secondary | ICD-10-CM | POA: Diagnosis not present

## 2020-09-04 DIAGNOSIS — Z9981 Dependence on supplemental oxygen: Secondary | ICD-10-CM | POA: Insufficient documentation

## 2020-09-04 DIAGNOSIS — Z794 Long term (current) use of insulin: Secondary | ICD-10-CM | POA: Diagnosis not present

## 2020-09-04 DIAGNOSIS — E119 Type 2 diabetes mellitus without complications: Secondary | ICD-10-CM | POA: Insufficient documentation

## 2020-09-04 DIAGNOSIS — Z79899 Other long term (current) drug therapy: Secondary | ICD-10-CM | POA: Diagnosis not present

## 2020-09-04 DIAGNOSIS — Z86718 Personal history of other venous thrombosis and embolism: Secondary | ICD-10-CM | POA: Insufficient documentation

## 2020-09-04 DIAGNOSIS — I272 Pulmonary hypertension, unspecified: Secondary | ICD-10-CM

## 2020-09-04 DIAGNOSIS — E785 Hyperlipidemia, unspecified: Secondary | ICD-10-CM | POA: Insufficient documentation

## 2020-09-04 DIAGNOSIS — Z87891 Personal history of nicotine dependence: Secondary | ICD-10-CM | POA: Insufficient documentation

## 2020-09-04 DIAGNOSIS — I5032 Chronic diastolic (congestive) heart failure: Secondary | ICD-10-CM | POA: Insufficient documentation

## 2020-09-04 DIAGNOSIS — J439 Emphysema, unspecified: Secondary | ICD-10-CM | POA: Diagnosis not present

## 2020-09-04 LAB — BASIC METABOLIC PANEL
Anion gap: 9 (ref 5–15)
BUN: 20 mg/dL (ref 8–23)
CO2: 25 mmol/L (ref 22–32)
Calcium: 8.9 mg/dL (ref 8.9–10.3)
Chloride: 104 mmol/L (ref 98–111)
Creatinine, Ser: 0.75 mg/dL (ref 0.44–1.00)
GFR, Estimated: >60 mL/min (ref >60)
Glucose, Bld: 59 mg/dL — ABNORMAL LOW (ref 70–99)
Potassium: 3.9 mmol/L (ref 3.5–5.1)
Sodium: 138 mmol/L (ref 135–145)

## 2020-09-04 LAB — BRAIN NATRIURETIC PEPTIDE: B Natriuretic Peptide: 65.8 pg/mL (ref 0.0–100.0)

## 2020-09-04 LAB — ECHOCARDIOGRAM COMPLETE
Area-P 1/2: 4.15 cm2
Calc EF: 56.7 %
S' Lateral: 3.4 cm
Single Plane A2C EF: 55.9 %
Single Plane A4C EF: 57.7 %

## 2020-09-04 NOTE — Patient Instructions (Addendum)
EKG done today.  Labs done today. We will contact you only if your labs are abnormal.  We will be in contact with you regarding starting Tyvaso.   No medication changes were made. Please continue all current medications as prescribed.  Your physician recommends that you schedule a follow-up appointment in: 3 months  If you have any questions or concerns before your next appointment please send [name] a message through [place] or call our office at [phone].    TO LEAVE A MESSAGE FOR THE NURSE SELECT OPTION 2, PLEASE LEAVE A MESSAGE INCLUDING: . YOUR NAME . DATE OF BIRTH . CALL BACK NUMBER . REASON FOR CALL**this is important as we prioritize the call backs  YOU WILL RECEIVE A CALL BACK THE SAME DAY AS LONG AS YOU CALL BEFORE 4:00 PM   Do the following things EVERYDAY: 1) Weigh yourself in the morning before breakfast. Write it down and keep it in a log. 2) Take your medicines as prescribed 3) Eat low salt foods--Limit salt (sodium) to 2000 mg per day.  4) Stay as active as you can everyday 5) Limit all fluids for the day to less than 2 liters   At the [facility], you and your health needs are our priority. As part of our continuing mission to provide you with exceptional heart care, we have created designated Provider Care Teams. These Care Teams include your primary Cardiologist (physician) and Advanced Practice Providers (APPs- Physician Assistants and Nurse Practitioners) who all work together to provide you with the care you need, when you need it.   You may see any of the following providers on your designated Care Team at your next follow up: Marland Kitchen Dr Glori Bickers . Dr Loralie Champagne . Darrick Grinder, NP . Lyda Jester, PA . Audry Riles, PharmD   Please be sure to bring in all your medications bottles to every appointment.

## 2020-09-04 NOTE — Progress Notes (Signed)
PCP: Albina Billet, MD Cardiology: Dr. Fletcher Anon HF Cardiology: Dr. Aundra Dubin  75 y.o. with history of chronic hypoxemic respiratory failure, OSA, and chronic diastolic CHF was referred by Dr. Fletcher Anon for evaluation of CHF and pulmonary hypertension.  Patient has been on home oxygen for 2-3 years per her report.  She was a smoker remotely. She had a DVT in 2018.  Anticoagulation was stopped due to refractory anemia and IVC filter was placed.  CT chest did not show a PE. She has OSA and uses CPAP.  Last echo in 1/21 showed normal LV EF with PASP 80 and severely dilated RV with mild RV dysfunction and D-shaped septum. She has been on Lasix for diuresis due to RV failure.    RHC/LHC was done in 3/21, showing mild CAD; mean RA 12, PA 68/25 mean 42, mean PCWP 9, CI 4.1, PVR 4.13 WU.  V/Q scan in 3/21 did not show evidence for chronic PE.  High resolution chest CT in 3/21 did not show evidence for ILD, it did show mild-moderate emphysema. PFTs (3/21) showed moderate-severe COPD.   She has been diagnosed with GI stromal tumor and is now on [place].   Echo was done today and reviewed, EF 60-65%, low normal RV function, mild RV enlargement, PASP 44 mmHg  She returns for followup of pulmonary hypertension and RV failure.  Wearing home oxygen, 4L at rest and 6L with exertion.  No dyspnea walking on flat ground as long as she wears oxygen.  No chest pain.  No lightheadedness/syncope.  No palpitations.  Feels like she is doing well on Opsumit and tadalafil.      6 minute walk (3/21): 182 m 6 minute walk (5/21): 243 m, oxygen saturation dropped to as low as 70s 6 minute walk (7/21): Only walked 3 min bc oxygen saturation dropped to 70s and CMA stopped walk, she went 121 m.  6 minute walk (3/22): 298 m  Labs (12/20): BNP 819 Labs (2/21): K 4.4, creatinine 0.72 Labs (3/21): RF normal, ANA negative, anti-Jo-1 negative, anti-SCL-70 negative Labs (5/21): K 4.3, creatinine 0.88 Labs (6/21): hgb 10 Labs (3/22): BNP  89 Labs (4/22): K 3.9, creatinine 0.93  PMH: 1. COVID-19 PNA 8/20.  2. Type 2 diabetes 3. DVT: right leg, 2018.  - Has IVC filter, not anticoagulated due to h/o anemia.  4. HTN 5. Hyperlipidemia.  6. OSA: Uses Bipap.  7. COPD: Chronic hypoxemic respiratory failure, 3 L home oxygen.  8. Hyponatremia on HCTZ 9. Chronic diastolic CHF:  - Lexiscan Cardiolite in 2/19 was low risk no ischemia.  - Echo (1/21): EF 65-70%, no LVH, trivial MR, PASP 80 mmHg, severe RV enlargement with mildly decreased RV systolic function, D-shaped interventricular septum, dilated IVC.  - CTA chest (8/18): No evidence for PE.  10. Pulmonary hypertension: RHC/LHC 3/21 with mild CAD; mean RA 12, PA 68/25 mean 42, mean PCWP 9, CI 4.1, PVR 4.13 WU.  - Serologic workup negative.  - V/Q scan (3/21): No chronic PE.  - HRCT (3/21): No ILD, mild-moderate COPD. - PFTs (3/21): moderate-severe COPD - Echo (5/22): EF 60-65%, low normal RV function, mild RV enlargement, PASP 44 mmHg 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   Social History   Socioeconomic History  . Marital status: Single    Spouse name: Not on file  . Number of children: 2  . Years of education: Not on file  . Highest education level: Not on file  Occupational History  . Occupation: CELL  Employer: RETIRED  Tobacco Use  . Smoking status: Former Smoker    Packs/day: 1.50    Years: 20.00    Pack years: 30.00    Quit date: 1999    Years since quitting: 23.4  . Smokeless tobacco: Never Used  Vaping Use  . Vaping Use: Never used  Substance and Sexual Activity  . Alcohol use: No    Alcohol/week: 0.0 standard drinks  . Drug use: No  . Sexual activity: Not Currently  Other Topics Concern  . Not on file  Social History Narrative   Independent at baseline. Lives at home with her brother; in [place]. Quit smoking 20 years; no alcohol.    Social Determinants of Health   Financial Resource Strain: Not on file  Food Insecurity: Not on  file  Transportation Needs: Not on file  Physical Activity: Not on file  Stress: Not on file  Social Connections: Not on file  Intimate Partner Violence: Not on file   Family History  Problem Relation Age of Onset  . Prostate cancer Father   . Other Sister        mouth cancer  . Hypertension Mother    ROS: All systems reviewed and negative except as per HPI.   Current Outpatient Medications  Medication Sig Dispense Refill  . ACCU-CHEK AVIVA PLUS test strip 1 each 2 (two) times daily.    Marland Kitchen acetaminophen (TYLENOL) 325 MG tablet Take 2 tablets (650 mg total) by mouth every 6 (six) hours as needed for mild pain (or Fever >/= 101).    Marland Kitchen aspirin 81 MG chewable tablet Chew 1 tablet (81 mg total) by mouth daily. 30 tablet 0  . dapagliflozin propanediol (FARXIGA) 10 MG TABS tablet TAKE 1 TABLET BY MOUTH ONCE DAILY 30 tablet 3  . ferrous gluconate (FERGON) 325 MG tablet Take 325 mg by mouth in the morning and at bedtime.     . furosemide (LASIX) 40 MG tablet Take 1 tablet (40 mg total) by mouth in the morning AND 0.5 tablets (20 mg total) every evening. 45 tablet 3  . HUMALOG KWIKPEN 100 UNIT/ML KiwkPen Inject 10 Units into the skin 2 (two) times daily.     Marland Kitchen imatinib (GLEEVEC) 100 MG tablet TAKE 4 TABLETS (400 MG TOTAL) BY MOUTH DAILY. TAKE WITH MEALS AND LARGE GLASS OF WATER 120 tablet 1  . insulin glargine (LANTUS) 100 UNIT/ML injection Inject 32 Units into the skin daily.     Marland Kitchen latanoprost (XALATAN) 0.005 % ophthalmic solution Place 1 drop into both eyes at bedtime.     . lovastatin (MEVACOR) 20 MG tablet Take 20 mg by mouth every evening.     . metFORMIN (GLUCOPHAGE) 1000 MG tablet Take 1,000 mg by mouth daily.     . Multiple Vitamin (MULTIVITAMIN) tablet Take 1 tablet by mouth daily.    Marland Kitchen omeprazole (PRILOSEC) 40 MG capsule Take 40 mg by mouth daily.    . ondansetron (ZOFRAN) 8 MG tablet One pill every 8 hours as needed for nausea/vomitting. 45 tablet 1  . OPSUMIT 10 MG tablet Take 1  tablet daily. 30 tablet 11  . potassium chloride SA (KLOR-CON) 20 MEQ tablet Take 1 tablet (20 mEq total) by mouth daily. 90 tablet 3  . STIOLTO RESPIMAT 2.5-2.5 MCG/ACT AERS INHALE 2 PUFFS BY MOUTH ONCE DAILY 4 g 5  . tadalafil, PAH, (ALYQ) 20 MG tablet Take 2 tablets (40 mg total) by mouth daily. 60 tablet 11  . timolol (TIMOPTIC) 0.25 %  ophthalmic solution Place 1 drop into both eyes 2 (two) times daily.      No current facility-administered medications for this encounter.   BP (!) 132/52   Pulse (!) 59   Wt 91.3 kg (201 lb 3.2 oz)   SpO2 99% Comment: 4 L n/c  BMI 33.48 kg/m  General: NAD Neck: JVP 7-8 cmn, no thyromegaly or thyroid nodule.  Lungs: Mildly distant BS CV: Nondisplaced PMI.  Heart regular S1/S2, no S3/S4, no murmur.  1+ ankle edema.  No carotid bruit.  Normal pedal pulses.  Abdomen: Soft, nontender, no hepatosplenomegaly, no distention.  Skin: Intact without lesions or rashes.  Neurologic: Alert and oriented x 3.  Psych: Normal affect. Extremities: No clubbing or cyanosis.  HEENT: Normal.   Assessment/Plan: 1. RV failure: Echo in 1/21 showed significant RV dysfunction with severe pulmonary hypertension and D-shaped interventricular septum.  Based on RHC, it looks like RV failure is due to pulmonary arterial hypertension rather than LV failure (normal PCWP).  Repeated echo today showed EF 60-65%, low normal RV function, mild RV enlargement, PASP 44 mmHg. She is not significantly volume overloaded on exam today.   - Continue current Lasix.  BMET/BNP today.   2. Pulmonary HTN: Echo in 1/21 with RV failure and estimate PA systolic pressure 80 mmHg.  RHC in 3/21 showed pulmonary arterial hypertension.  Serologic workup was negative.  V/Q scan showed no chronic PE.  HRCT showed no ILD, mild-moderate emphysema.  PFTs suggestive of moderate-severe COPD. 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  She has  been symptomatically improved with selective pulmonary vasodilators.   - Continue to use Bipap at night, oxygen during the day.  - Continue Opsumit 10 mg daily.  - Continue tadalafil 40 mg daily.  - Given improvement with Opsumit and tadalafil, I will add Tyvaso (inhaled so may limit V/Q mismatch in the setting of some degree of pulmonary parenchymal disease).  - Check BNP today.   3. H/o DVT: 2018.  She has IVC filter and is not anticoagulated due to h/o anemia.  4. Type 2 diabetes: She is now on Farxiga.  5. Chronic hypoxemic respiratory failure: Home oxygen.  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  Suspect pulmonary arterial hypertension also plays a role.  - Continue home oxygen.  Followup in 3 months with 6 minute walk.   Loralie Champagne 09/04/2020

## 2020-09-04 NOTE — Progress Notes (Signed)
  Echocardiogram 2D Echocardiogram has been performed.  Bobbye Charleston 09/04/2020, 10:59 AM

## 2020-09-19 ENCOUNTER — Encounter: Payer: Self-pay | Admitting: Internal Medicine

## 2020-09-19 ENCOUNTER — Inpatient Hospital Stay: Payer: Medicare Other

## 2020-09-19 ENCOUNTER — Inpatient Hospital Stay: Payer: Medicare Other | Admitting: Internal Medicine

## 2020-09-19 ENCOUNTER — Inpatient Hospital Stay: Payer: Medicare Other | Attending: Internal Medicine

## 2020-09-19 VITALS — BP 119/51 | HR 65 | Temp 96.9°F | Resp 16

## 2020-09-19 DIAGNOSIS — I10 Essential (primary) hypertension: Secondary | ICD-10-CM | POA: Diagnosis not present

## 2020-09-19 DIAGNOSIS — C49A3 Gastrointestinal stromal tumor of small intestine: Secondary | ICD-10-CM | POA: Insufficient documentation

## 2020-09-19 DIAGNOSIS — Z7982 Long term (current) use of aspirin: Secondary | ICD-10-CM | POA: Diagnosis not present

## 2020-09-19 DIAGNOSIS — I509 Heart failure, unspecified: Secondary | ICD-10-CM | POA: Diagnosis not present

## 2020-09-19 DIAGNOSIS — M791 Myalgia, unspecified site: Secondary | ICD-10-CM | POA: Insufficient documentation

## 2020-09-19 DIAGNOSIS — E611 Iron deficiency: Secondary | ICD-10-CM

## 2020-09-19 DIAGNOSIS — E785 Hyperlipidemia, unspecified: Secondary | ICD-10-CM | POA: Diagnosis not present

## 2020-09-19 DIAGNOSIS — Z79899 Other long term (current) drug therapy: Secondary | ICD-10-CM | POA: Diagnosis not present

## 2020-09-19 DIAGNOSIS — D509 Iron deficiency anemia, unspecified: Secondary | ICD-10-CM | POA: Diagnosis not present

## 2020-09-19 DIAGNOSIS — Z87891 Personal history of nicotine dependence: Secondary | ICD-10-CM | POA: Diagnosis not present

## 2020-09-19 DIAGNOSIS — J449 Chronic obstructive pulmonary disease, unspecified: Secondary | ICD-10-CM | POA: Diagnosis not present

## 2020-09-19 DIAGNOSIS — E669 Obesity, unspecified: Secondary | ICD-10-CM | POA: Diagnosis not present

## 2020-09-19 DIAGNOSIS — E119 Type 2 diabetes mellitus without complications: Secondary | ICD-10-CM | POA: Diagnosis not present

## 2020-09-19 LAB — CBC WITH DIFFERENTIAL/PLATELET
Abs Immature Granulocytes: 0.03 10*3/uL (ref 0.00–0.07)
Basophils Absolute: 0 10*3/uL (ref 0.0–0.1)
Basophils Relative: 0 %
Eosinophils Absolute: 0 10*3/uL (ref 0.0–0.5)
Eosinophils Relative: 0 %
HCT: 34.9 % — ABNORMAL LOW (ref 36.0–46.0)
Hemoglobin: 11.3 g/dL — ABNORMAL LOW (ref 12.0–15.0)
Immature Granulocytes: 0 %
Lymphocytes Relative: 6 %
Lymphs Abs: 0.6 10*3/uL — ABNORMAL LOW (ref 0.7–4.0)
MCH: 28.1 pg (ref 26.0–34.0)
MCHC: 32.4 g/dL (ref 30.0–36.0)
MCV: 86.8 fL (ref 80.0–100.0)
Monocytes Absolute: 0.5 10*3/uL (ref 0.1–1.0)
Monocytes Relative: 6 %
Neutro Abs: 8.1 10*3/uL — ABNORMAL HIGH (ref 1.7–7.7)
Neutrophils Relative %: 88 %
Platelets: 262 10*3/uL (ref 150–400)
RBC: 4.02 MIL/uL (ref 3.87–5.11)
RDW: 16 % — ABNORMAL HIGH (ref 11.5–15.5)
WBC: 9.2 10*3/uL (ref 4.0–10.5)
nRBC: 0 % (ref 0.0–0.2)

## 2020-09-19 LAB — COMPREHENSIVE METABOLIC PANEL
ALT: 12 U/L (ref 0–44)
AST: 17 U/L (ref 15–41)
Albumin: 3.7 g/dL (ref 3.5–5.0)
Alkaline Phosphatase: 49 U/L (ref 38–126)
Anion gap: 11 (ref 5–15)
BUN: 15 mg/dL (ref 8–23)
CO2: 24 mmol/L (ref 22–32)
Calcium: 8.7 mg/dL — ABNORMAL LOW (ref 8.9–10.3)
Chloride: 102 mmol/L (ref 98–111)
Creatinine, Ser: 0.73 mg/dL (ref 0.44–1.00)
GFR, Estimated: >60 mL/min (ref >60)
Glucose, Bld: 131 mg/dL — ABNORMAL HIGH (ref 70–99)
Potassium: 4 mmol/L (ref 3.5–5.1)
Sodium: 137 mmol/L (ref 135–145)
Total Bilirubin: 0.3 mg/dL (ref 0.3–1.2)
Total Protein: 7.2 g/dL (ref 6.5–8.1)

## 2020-09-19 MED ORDER — IRON SUCROSE 20 MG/ML IV SOLN
200.0000 mg | Freq: Once | INTRAVENOUS | Status: AC
  Administered 2020-09-19: 200 mg via INTRAVENOUS
  Filled 2020-09-19: qty 10

## 2020-09-19 MED ORDER — SODIUM CHLORIDE 0.9 % IV SOLN
Freq: Once | INTRAVENOUS | Status: AC
  Filled 2020-09-19: qty 250

## 2020-09-19 NOTE — Assessment & Plan Note (Addendum)
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 mesneteric LN] [s/p IR biopsy]. Localized disease on Gleevec 400 milligrams [nov 28th 2021].FEB 17th, 2022-  PET scan shows significant improvement of the FDG activity; however slight shrinkage of the tumor.    # Continue Gleevec [6 months June 2022]; repeat a PET scan ASAP for further evaluation.  Based on response on PET scan patient will likely need surgery.  Will inform Dr. Bary Castilla when PET scan is available  #CHF/COPD home O2 4-6Lits;-borderline respiratory status-stable  #Iron deficient anemia-  sec to small bowel GIST.  Today hemoglobin is  11.3;    Proceed with Venofer today.  # Myalgias- recommend ca+vit D; tylenol as needed.   * will call PET # DISPOSITION:.   #  Venofer TODAY;  # PET scan ASAP # Follow up in 50month  MD; labs-cbc/cmp;possible venofer;Dr.B

## 2020-09-19 NOTE — Progress Notes (Signed)
[facility] NOTE  Patient Care Team: Albina Billet, MD as PCP - General (Unknown Physician Specialty) Wellington Hampshire, MD as PCP - Cardiology (Cardiology) Christene Lye, MD (General Surgery) Albina Billet, MD (Internal Medicine) Delana Meyer, Dolores Lory, MD (Vascular Surgery) Wilford Corner, MD as Consulting Physician (Gastroenterology) Cammie Sickle, MD as Consulting Physician (Hematology and Oncology)  CHIEF COMPLAINTS/PURPOSE OF CONSULTATION: Anemia   HEMATOLOGY HISTORY  Oncology History Overview Note  NOV 2021- CT Bx- TUMOR  Tumor Site: Peritoneum, jejunal  Histologic Type: Gastrointestinal stromal tumor, spindle cell type  Mitotic Rate: 1 per 5 mm squared  Histologic Grade: G1, low grade (mitotic rate less than or equal to 5  per 5 mm squared)- STAGE T2N1 [possible mesneteric LN]; CT A/P-9.8 x9.8 cm  # NOV 29th 2021- GLEEVEC 400 mg/day.     # CHRONIC ANEMIA EGD/capsule-WNL-2018 [Dr.vanga]-UGIB ; colonoscopy-2018 [[facility]] ; capsule-not done; hemoglobin around 8.8; iron saturation 5% [Dr.Tate].  Poor tolerance of p.o. 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   GIST (gastrointestinal stromal tumor) of small bowel, malignant ([place])  03/12/2020 Initial Diagnosis   GIST (gastrointestinal stromal tumor) of small bowel, malignant (HCC)      HISTORY OF PRESENTING ILLNESS:  Susan Barajas 75 y.o.  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  Patient has not had any hospitalizations.  Denies any blood in stool black or stools.  Mild joint pains.  No swelling of legs.  No diarrhea.   Review of Systems  Constitutional: Positive for malaise/fatigue. Negative for chills, diaphoresis, fever and weight loss.  HENT: Negative for nosebleeds and sore throat.   Eyes: Negative for double vision.  Respiratory: Positive for shortness of breath. Negative for cough, hemoptysis, sputum production and wheezing.   Cardiovascular: Negative for chest pain, palpitations, orthopnea and leg swelling.  Gastrointestinal: Negative for abdominal pain, blood in stool, constipation, diarrhea, heartburn, melena, nausea and vomiting.  Genitourinary: Negative for dysuria, frequency and urgency.  Musculoskeletal: Negative for back pain and joint pain.  Skin: Negative.  Negative for itching and rash.  Neurological: Negative for dizziness, tingling, focal weakness, weakness and headaches.  Endo/Heme/Allergies: Does not bruise/bleed easily.  Psychiatric/Behavioral: Negative for depression. The patient is not nervous/anxious and does not have insomnia.     MEDICAL HISTORY:  Past Medical History:  Diagnosis Date  . Asthma 1950  . CHF (congestive heart failure) ([place])   . Clotting disorder (HCC)    right leg  . 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 GIST (gastrointestinal stromal tumor) of small bowel, malignant ([place])   . Glaucoma 1949  . Hyperlipidemia   . Hypertension   . Obesity, unspecified   . Personal history of tobacco use, presenting hazards to health   . Varicose veins of lower extremities with other complications     SURGICAL HISTORY: Past Surgical History:  Procedure Laterality Date  .  BREAST BIOPSY Right 1991  . BREAST BIOPSY Left 2013  . COLONOSCOPY  2010   Dr. Vira Agar  . COLONOSCOPY  Jan 2016   Dr Vira Agar  . ESOPHAGOGASTRODUODENOSCOPY N/A 11/26/2016   Procedure: ESOPHAGOGASTRODUODENOSCOPY (EGD);  Surgeon:  Lin Landsman, MD;  Location[facility] ENDOSCOPY;  Service: Gastroenterology;  Laterality: N/A;  . GIVENS CAPSULE STUDY N/A 11/26/2016   Procedure: GIVENS CAPSULE STUDY, if EGD negative, plan to drop capsule with scope if EGD negative;  Surgeon: Lin Landsman, MD;  Location: [facility] ENDOSCOPY;  Service: Gastroenterology;  Laterality: N/A;  . IVC FILTER INSERTION N/A 11/14/2016   Procedure: IVC Filter Insertion;  Surgeon: Katha Cabal, MD;  Location: [facility] LAB;  Service: Cardiovascular;  Laterality: N/A;  . RIGHT/LEFT HEART CATH AND CORONARY ANGIOGRAPHY N/A 06/30/2019   Procedure: RIGHT/LEFT HEART CATH AND CORONARY ANGIOGRAPHY;  Surgeon: Larey Dresser, MD;  Location: [facility];  Service: Cardiovascular;  Laterality: N/A;  . VEIN SURGERY Right 2006   Vein Closure Procedure; RF ablation of right GSV    SOCIAL HISTORY: Social History   Socioeconomic History  . Marital status: Single    Spouse name: Not on file  . Number of children: 2  . Years of education: Not on file  . Highest education level: Not on file  Occupational History  . Occupation: CELL    Employer: RETIRED  Tobacco Use  . Smoking status: Former Smoker    Packs/day: 1.50    Years: 20.00    Pack years: 30.00    Quit date: 1999    Years since quitting: 23.4  . Smokeless tobacco: Never Used  Vaping Use  . Vaping Use: Never used  Substance and Sexual Activity  . Alcohol use: No    Alcohol/week: 0.0 standard drinks  . Drug use: No  . Sexual activity: Not Currently  Other Topics Concern  . Not on file  Social History Narrative   Independent at baseline. Lives at home with her brother; in [place]. Quit smoking 20 years; no alcohol.    Social Determinants of Health   Financial Resource Strain: Not on file  Food Insecurity: Not on file  Transportation Needs: Not on file  Physical Activity: Not on file  Stress: Not on file  Social Connections: Not on file  Intimate Partner Violence:  Not on file    FAMILY HISTORY: Family History  Problem Relation Age of Onset  . Prostate cancer Father   . Other Sister        mouth cancer  . Hypertension Mother     ALLERGIES:  has No Known Allergies.  MEDICATIONS:  Current Outpatient Medications  Medication Sig Dispense Refill  . ACCU-CHEK AVIVA PLUS test strip 1 each 2 (two) times daily.    . Accu-Chek Softclix Lancets lancets USE 1 TO CHECK GLUCOSE TWICE DAILY    . acetaminophen (TYLENOL) 325 MG tablet Take 2 tablets (650 mg total) by mouth every 6 (six) hours as needed for mild pain (or Fever >/= 101).    Marland Kitchen aspirin 81 MG chewable tablet Chew 1 tablet (81 mg total) by mouth daily. 30 tablet 0  . Calcium Carb-Cholecalciferol (CALCIUM/VITAMIN D) 500-200 MG-UNIT TABS Take 1,000 mg by mouth. Takes 1000 mg of calcium and 1000 mg of vitamin D    . dapagliflozin propanediol (FARXIGA) 10 MG TABS tablet TAKE 1 TABLET BY MOUTH ONCE DAILY 30 tablet 3  . ferrous gluconate (FERGON) 325 MG tablet Take 325 mg by mouth in  the morning and at bedtime.     . furosemide (LASIX) 40 MG tablet Take 1 tablet (40 mg total) by mouth in the morning AND 0.5 tablets (20 mg total) every evening. 45 tablet 3  . HUMALOG KWIKPEN 100 UNIT/ML KiwkPen Inject 10 Units into the skin 2 (two) times daily.     Marland Kitchen imatinib (GLEEVEC) 100 MG tablet TAKE 4 TABLETS (400 MG TOTAL) BY MOUTH DAILY. TAKE WITH MEALS AND LARGE GLASS OF WATER 120 tablet 1  . insulin glargine (LANTUS) 100 UNIT/ML injection Inject 32 Units into the skin daily.     Marland Kitchen latanoprost (XALATAN) 0.005 % ophthalmic solution Place 1 drop into both eyes at bedtime.     . lovastatin (MEVACOR) 20 MG tablet Take 20 mg by mouth every evening.     . metFORMIN (GLUCOPHAGE) 1000 MG tablet Take 1,000 mg by mouth daily.     . Multiple Vitamin (MULTIVITAMIN) tablet Take 1 tablet by mouth daily.    Marland Kitchen omeprazole (PRILOSEC) 40 MG capsule Take 40 mg by mouth daily.    . ondansetron (ZOFRAN) 8 MG tablet One pill every 8 hours  as needed for nausea/vomitting. 45 tablet 1  . OPSUMIT 10 MG tablet Take 1 tablet daily. 30 tablet 11  . potassium chloride SA (KLOR-CON) 20 MEQ tablet Take 1 tablet (20 mEq total) by mouth daily. 90 tablet 3  . STIOLTO RESPIMAT 2.5-2.5 MCG/ACT AERS INHALE 2 PUFFS BY MOUTH ONCE DAILY 4 g 5  . tadalafil, PAH, (ALYQ) 20 MG tablet Take 2 tablets (40 mg total) by mouth daily. 60 tablet 11  . timolol (TIMOPTIC) 0.25 % ophthalmic solution Place 1 drop into both eyes 2 (two) times daily.      No current facility-administered medications for this visit.      PHYSICAL EXAMINATION:   Vitals:   09/19/20 1045  BP: (!) 128/59  Pulse: 79  Resp: 16  Temp: (!) 97.4 F (36.3 C)  SpO2: 91%   Filed Weights   09/19/20 1045  Weight: 200 lb 12.8 oz (91.1 kg)    Physical Exam Constitutional:      Comments: 6 L of oxygen nasal cannula.  Walk independently. She is alone.  HENT:     Head: Normocephalic and atraumatic.     Mouth/Throat:     Pharynx: No oropharyngeal exudate.  Eyes:     Pupils: Pupils are equal, round, and reactive to light.  Cardiovascular:     Rate and Rhythm: Normal rate and regular rhythm.  Pulmonary:     Effort: No respiratory distress.     Breath sounds: No wheezing.     Comments: Decreased breath sounds. Abdominal:     General: Bowel sounds are normal. There is no distension.     Palpations: Abdomen is soft. There is no mass.     Tenderness: There is no abdominal tenderness. There is no guarding or rebound.  Musculoskeletal:        General: No tenderness. Normal range of motion.     Cervical back: Normal range of motion and neck supple.  Skin:    General: Skin is warm.  Neurological:     Mental Status: She is alert and oriented to person, place, and time.  Psychiatric:        Mood and Affect: Affect normal.     LABORATORY DATA:  I have reviewed the data as listed Lab Results  Component Value Date   WBC 9.2 09/19/2020   HGB 11.3 (L) 09/19/2020  HCT 34.9  (L) 09/19/2020   MCV 86.8 09/19/2020   PLT 262 09/19/2020   Recent Labs    10/28/19 1213 02/10/20 1246 07/03/20 1021 07/06/20 1113 08/01/20 1236 09/04/20 1151 09/19/20 1020  NA 138   < > 138   < > 138 138 137  K 4.2   < > 4.1   < > 3.9 3.9 4.0  CL 107   < > 104   < > 102 104 102  CO2 25   < > 25   < > _0 GLUCOSE 103*   < > 118*   < > 72 59* 131*  BUN 14   < > 24*   < > _1 CREATININE 0.78   < > 0.85   < > 0.93 0.75 0.73  CALCIUM 8.6*   < > 8.8*   < > 9.2 8.9 8.7*  GFRNONAA >60   < > >60   < > >60 >60 >60  GFRAA >60  --   --   --   --   --   --   PROT  --    < > 7.1  --  7.6  --  7.2  ALBUMIN  --    < > 3.9  --  4.0  --  3.7  AST  --    < > 24  --  19  --  17  ALT  --    < > 13  --  14  --  12  ALKPHOS  --    < > 55  --  55  --  49  BILITOT  --    < > 0.7  --  0.4  --  0.3   < > = values in this interval not displayed.     ECHOCARDIOGRAM COMPLETE  Result Date: 09/04/2020    ECHOCARDIOGRAM REPORT   Patient Name:   Susan Barajas Date of Exam: 09/04/2020 Medical Rec #:  [id number]            Height:       65.0 in Accession #:    [id number]           Weight:       200.0 lb Date of Birth:  1946-03-29             BSA:          1.978 m Patient Age:    75 years             BP:           147/69 mmHg Patient Gender: F                    HR:           76 bpm. Exam Location:  Outpatient Procedure: 2D Echo, 3D Echo, Strain Analysis, Cardiac Doppler and Color Doppler Indications:    I27.20 Pulmonary Hypertension  History:        Patient has prior history of Echocardiogram examinations, most                 recent 04/20/2019. Abnormal ECG, Pulmonary HTN and COPD,                 Signs/Symptoms:Shortness of Breath and Dyspnea; Risk                 Factors:Sleep Apnea.  Sonographer:    Roseanna Rainbow Referring Phys: [place]  S MCLEAN  Sonographer Comments: Technically difficult study due to poor echo windows. Global longitudinal strain was attempted. IMPRESSIONS  1. Left ventricular  ejection fraction, by estimation, is 60 to 65%. The left ventricle has normal function. The left ventricle has no regional wall motion abnormalities. Left ventricular diastolic parameters were normal. The average left ventricular global longitudinal strain is -21.8 %. The global longitudinal strain is normal.  2. Right ventricular systolic function is low normal. The right ventricular size is mildly enlarged. There is mildly elevated pulmonary artery systolic pressure. The estimated right ventricular systolic pressure is 63.8 mmHg.  3. Right atrial size was mildly dilated.  4. The mitral valve is normal in structure. Trivial mitral valve regurgitation. No evidence of mitral stenosis.  5. The aortic valve is tricuspid. Aortic valve regurgitation is not visualized. No aortic stenosis is present.  6. The inferior vena cava is normal in size with greater than 50% respiratory variability, suggesting right atrial pressure of 3 mmHg. FINDINGS  Left Ventricle: Left ventricular ejection fraction, by estimation, is 60 to 65%. The left ventricle has normal function. The left ventricle has no regional wall motion abnormalities. The average left ventricular global longitudinal strain is -21.8 %. The global longitudinal strain is normal. The left ventricular internal cavity size was normal in size. There is no left ventricular hypertrophy. Left ventricular diastolic parameters were normal. Right Ventricle: The right ventricular size is mildly enlarged. No increase in right ventricular wall thickness. Right ventricular systolic function is low normal. There is mildly elevated pulmonary artery systolic pressure. The tricuspid regurgitant velocity is 3.19 m/s, and with an assumed right atrial pressure of 3 mmHg, the estimated right ventricular systolic pressure is 46.6 mmHg. Left Atrium: Left atrial size was normal in size. Right Atrium: Right atrial size was mildly dilated. Pericardium: There is no evidence of pericardial effusion.  Mitral Valve: The mitral valve is normal in structure. Trivial mitral valve regurgitation. No evidence of mitral valve stenosis. Tricuspid Valve: The tricuspid valve is normal in structure. Tricuspid valve regurgitation is trivial. Aortic Valve: The aortic valve is tricuspid. Aortic valve regurgitation is not visualized. No aortic stenosis is present. Pulmonic Valve: The pulmonic valve was normal in structure. Pulmonic valve regurgitation is trivial. Aorta: The aortic root is normal in size and structure. Venous: The inferior vena cava is normal in size with greater than 50% respiratory variability, suggesting right atrial pressure of 3 mmHg. IAS/Shunts: No atrial level shunt detected by color flow Doppler.  LEFT VENTRICLE PLAX 2D LVIDd:         5.40 cm      Diastology LVIDs:         3.40 cm      LV e' medial:    11.60 cm/s LV PW:         1.00 cm      LV E/e' medial:  8.6 LV IVS:        1.00 cm      LV e' lateral:   13.40 cm/s LVOT diam:     2.30 cm      LV E/e' lateral: 7.5 LV SV:         120 LV SV Index:   60           2D Longitudinal Strain LVOT Area:     4.15 cm     2D Strain GLS Avg:     -21.8 %  LV Volumes (MOD) LV vol d, MOD A2C: 102.0 ml LV vol d,  MOD A4C: 92.4 ml LV vol s, MOD A2C: 45.0 ml LV vol s, MOD A4C: 39.1 ml LV SV MOD A2C:     57.0 ml LV SV MOD A4C:     92.4 ml LV SV MOD BP:      55.4 ml RIGHT VENTRICLE             IVC RV S prime:     14.90 cm/s  IVC diam: 1.80 cm TAPSE (M-mode): 2.2 cm LEFT ATRIUM             Index       RIGHT ATRIUM           Index LA diam:        3.30 cm 1.67 cm/m  RA Area:     19.80 cm LA Vol (A2C):   47.1 ml 23.81 ml/m RA Volume:   61.60 ml  31.14 ml/m LA Vol (A4C):   54.8 ml 27.70 ml/m LA Biplane Vol: 51.2 ml 25.88 ml/m  AORTIC VALVE             PULMONIC VALVE LVOT Vmax:   123.00 cm/s PR End Diast Vel: 2.44 msec LVOT Vmean:  83.700 cm/s LVOT VTI:    0.288 m  AORTA Ao Root diam: 3.60 cm Ao Asc diam:  3.50 cm MITRAL VALVE                TRICUSPID VALVE MV Area (PHT): 4.15  cm     TR Peak grad:   40.7 mmHg MV Decel Time: 183 msec     TR Vmax:        319.00 cm/s MV E velocity: 100.00 cm/s MV A velocity: 82.60 cm/s   SHUNTS MV E/A ratio:  1.21         Systemic VTI:  0.29 m                             Systemic Diam: 2.30 cm Loralie Champagne MD Electronically signed by Loralie Champagne MD Signature Date/Time: 09/04/2020/11:44:50 AM    Final     GIST (gastrointestinal stromal tumor) of small bowel, malignant ([place]) # GIST of small bowel-low grade STAGE II [T3N0] vs  IV [N1] [low Mitotic index; T=9.8cm- T3; N1- ? mesneteric LN] [s/p IR biopsy]. Localized disease on Gleevec 400 milligrams [nov 28th 2021].FEB 17th, 2022-  PET scan shows significant improvement of the FDG activity; however slight shrinkage of the tumor.    # Continue Gleevec [6 months June 2022]; repeat a PET scan ASAP for further evaluation.  Based on response on PET scan patient will likely need surgery.  Will inform Dr. Bary Castilla when PET scan is available  #CHF/COPD home O2 4-6Lits;-borderline respiratory status-stable  #Iron deficient anemia-  sec to small bowel GIST.  Today hemoglobin is  11.3;    Proceed with Venofer today.  # Myalgias- recommend ca+vit D; tylenol as needed.   * will call PET # DISPOSITION:.   #  Venofer TODAY;  # PET scan ASAP # Follow up in 32month  MD; labs-cbc/cmp;possible venofer;Dr.B     All questions were answered. The patient knows to call the clinic with any problems, questions or concerns.    GCammie Sickle MD 09/19/2020 7:22 PM

## 2020-09-21 ENCOUNTER — Other Ambulatory Visit (HOSPITAL_COMMUNITY): Payer: Self-pay

## 2020-09-25 ENCOUNTER — Other Ambulatory Visit (HOSPITAL_COMMUNITY): Payer: Self-pay

## 2020-09-25 ENCOUNTER — Other Ambulatory Visit (HOSPITAL_COMMUNITY): Payer: Self-pay | Admitting: *Deleted

## 2020-09-25 DIAGNOSIS — I50810 Right heart failure, unspecified: Secondary | ICD-10-CM

## 2020-09-25 MED ORDER — DAPAGLIFLOZIN PROPANEDIOL 10 MG PO TABS: ORAL_TABLET | ORAL | 3 refills | Status: DC

## 2020-10-01 ENCOUNTER — Other Ambulatory Visit: Payer: Self-pay

## 2020-10-01 ENCOUNTER — Encounter
Admission: RE | Admit: 2020-10-01 | Discharge: 2020-10-01 | Disposition: A | Discharge status: Home / Self Care | Payer: Medicare Other | Source: Ambulatory Visit | Attending: Internal Medicine | Admitting: Internal Medicine

## 2020-10-01 DIAGNOSIS — R1902 Left upper quadrant abdominal swelling, mass and lump: Secondary | ICD-10-CM | POA: Diagnosis not present

## 2020-10-01 DIAGNOSIS — D259 Leiomyoma of uterus, unspecified: Secondary | ICD-10-CM | POA: Diagnosis not present

## 2020-10-01 DIAGNOSIS — C49A3 Gastrointestinal stromal tumor of small intestine: Secondary | ICD-10-CM | POA: Insufficient documentation

## 2020-10-01 DIAGNOSIS — I700 Atherosclerosis of aorta: Secondary | ICD-10-CM | POA: Diagnosis not present

## 2020-10-01 DIAGNOSIS — J439 Emphysema, unspecified: Secondary | ICD-10-CM | POA: Insufficient documentation

## 2020-10-01 DIAGNOSIS — I2510 Atherosclerotic heart disease of native coronary artery without angina pectoris: Secondary | ICD-10-CM | POA: Insufficient documentation

## 2020-10-01 LAB — GLUCOSE, CAPILLARY: Glucose-Capillary: 107 mg/dL — ABNORMAL HIGH (ref 70–99)

## 2020-10-01 MED ORDER — FLUDEOXYGLUCOSE F - 18 (FDG) INJECTION
10.4000 | Freq: Once | INTRAVENOUS | Status: AC | PRN
  Administered 2020-10-01: 11.41 via INTRAVENOUS

## 2020-10-03 ENCOUNTER — Telehealth: Payer: Self-pay | Admitting: Internal Medicine

## 2020-10-03 ENCOUNTER — Other Ambulatory Visit (HOSPITAL_COMMUNITY): Payer: Self-pay | Admitting: *Deleted

## 2020-10-03 DIAGNOSIS — I272 Pulmonary hypertension, unspecified: Secondary | ICD-10-CM

## 2020-10-03 MED ORDER — TADALAFIL (PAH) 20 MG PO TABS: 40.0000 mg | ORAL_TABLET | Freq: Every day | ORAL | 11 refills | Status: DC

## 2020-10-03 NOTE — Telephone Encounter (Signed)
On 6/22-I spoke to patient regarding the results of the PET scan; I think we reached maximal response on neoadjuvant Gleevec.  Recommend proceeding with surgery.  However continue [place] until surgery.  Patient will call us with the date for surgery when scheduled.    Discussed with Dr. Dwyane Luo office will reach out to patient regarding surgical evaluation.

## 2020-10-18 ENCOUNTER — Other Ambulatory Visit: Payer: Self-pay | Admitting: General Surgery

## 2020-10-18 NOTE — Progress Notes (Signed)
Subjective:     Patient ID: Susan Barajas is a 75 y.o. female.   HPI   The following portions of the patient's history were reviewed and updated as appropriate.   This an established patient is here today for: office visit. She is here follow up and discuss surgery for GIST, gastrointestinal stromal tumor, of small bowel.   The patient appears to have reached maximum benefit from [facility] therapy and is seen now to discuss surgical resection.   The patient is asymptomatic.  She reports no difficulty with bowel or bladder function.   We had a long discussion regarding her activities of daily living.  She mops the floor does all of her own housework and laundry, although her daughter does the vacuuming.  She no longer goes out to shop.  In the last 6-12 months she is not appreciated any change in her "wind".  She checks her oxygen levels at home about 3 times per week and reports that these run 90-95 on 4 L nasal cannula.   She is here with her son, Mulan Adan, who retired after 30 years in the [employer] as a Chief Technology Officer major and her daughter, Benancio Deeds, who works in housekeeping at [employer], accompany her today   Review of Systems  Constitutional: Negative for chills and fever.  Respiratory: Negative for cough.          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, unspecified laterality             Past Surgical History:  Procedure Laterality Date   BREAST EXCISIONAL BIOPSY Left 2013   BREAST EXCISIONAL BIOPSY Right 1991   COLONOSCOPY   04/19/2008    FH Colon Polyps (Sister)   COLONOSCOPY   05/12/2014    FH Colon Polyps (Sister): [facility] 04/2019   EGD   04/19/2008, 02/04/2008 (Inpt)    H Pylori, Gastritis   EGD   11/26/2016   givens capsule study   11/26/2016   IVC filter insertion   11/14/2016   LAPAROSCOPIC TUBAL LIGATION       right/left heart cath and coronary angiography   06/30/2019   vein surgery Right 2006        OB History   No obstetric history on file.        Social History           Socioeconomic History   Marital status: Single  Tobacco Use   Smoking status: Former Smoker  Packs/day: 1.00      Years: 20.00      Pack years: 20.00      Quit date: 04/15/1983      Years since quitting: 37.5   Smokeless tobacco: Never Used  Substance and Sexual Activity   Alcohol use: No      Alcohol/week: 0.0 standard drinks   Drug use: No   Sexual activity: Not Currently      Partners: Male        No Known Allergies   Current Medications        Current Outpatient Medications  Medication Sig Dispense Refill   acetaminophen (TYLENOL) 500 MG tablet Take 500 mg by mouth every 6 (six) hours as needed          aspirin 81 MG EC tablet Take 81 mg by mouth once daily       dapagliflozin (FARXIGA) 10 mg tablet once daily       ferrous sulfate 325 (65 FE) MG tablet Take 325 mg by mouth 2 (two) times daily with meals          FUROsemide (LASIX) 20 MG tablet Take 40 mg by mouth once daily          HUMALOG KWIKPEN INSULIN pen injector (concentration 100 units/mL) Inject 10 Units subcutaneously 2 (two) times daily          hydrochlorothiazide (HYDRODIURIL) 25 MG tablet Take 1 tablet  by mouth once daily.       imatinib (GLEEVEC) 100 MG tablet Take by mouth once daily       LANTUS SOLOSTAR 100 unit/mL (3 mL) pen injector Inject 32 Units subcutaneously every morning          latanoprost (XALATAN) 0.005 % ophthalmic solution Apply 1 drop to eye nightly       lovastatin (MEVACOR) 20 MG tablet Take 1 tablet by mouth nightly.       macitentan (OPSUMIT) 10 mg tablet Take 1 tablet by mouth once daily       metFORMIN (GLUCOPHAGE) 1000 MG tablet Take 1 tablet by mouth once daily.          MULTIVITAMIN ORAL Take by mouth once daily.       omega-3 fatty acids/fish oil 340-1,000 mg capsule Take 1 capsule by mouth once daily.       omeprazole (PRILOSEC) 40 MG DR capsule Take 40 mg by mouth once daily.       ondansetron (ZOFRAN) 8 MG tablet as needed       OXYGEN-AIR DELIVERY SYSTEMS MISC Use 2 L continuously.       pioglitazone (ACTOS) 30 MG tablet Take 1 tablet by mouth once daily       potassium chloride (KLOR-CON) 20 MEQ ER tablet Take 1 tablet by mouth once daily       sodium chloride 1 gram tablet Take 1 g by mouth once daily       tadalafil (ADCIRCA) 20 mg tablet Take by mouth once daily       timolol maleate (TIMOPTIC) 0.5 % ophthalmic solution Place 1 drop into both eyes 2 (two) times daily.       tiotropium-olodateroL (STIOLTO RESPIMAT) 2.5-2.5 mcg/actuation inhaler Inhale into the lungs once daily       NOVOLOG FLEXPEN 100 unit/mL pen injector Inject 10 Units subcutaneously.   (Patient not taking: Reported on 10/16/2020)        No current facility-administered medications for this visit.  Family History  Problem Relation Age of Onset   High blood pressure (Hypertension) Mother     Diabetes Mother     Prostate cancer Father     Colon polyps Sister     Diabetes Sister     Cancer Sister          mouth   Diabetes Sister     Diabetes Sister     Diabetes Brother     Kidney disease Brother     No Known Problems Brother     No Known Problems Brother      Diabetes Daughter     No Known Problems Son                 Objective:   Physical Exam Exam conducted with a chaperone present.  Constitutional:      Appearance: Normal appearance.  Cardiovascular:     Rate and Rhythm: Normal rate and regular rhythm.     Pulses: Normal pulses.     Heart sounds: Normal heart sounds.  Pulmonary:     Effort: Pulmonary effort is normal.     Breath sounds: Normal breath sounds.     Comments: No wheezes, rales or rhonchi.  No tachypnea.   Abdominal:     General: Bowel sounds are normal.     Palpations: Abdomen is soft.     Tenderness: There is no abdominal tenderness.     Hernia: No hernia is present.     Comments: No palpable masses or tenderness.  Musculoskeletal:     Cervical back: Neck supple.     Right lower leg: 1+ Pitting Edema present.     Left lower leg: 1+ Pitting Edema present.  Skin:    General: Skin is warm and dry.  Neurological:     Mental Status: She is alert and oriented to person, place, and time.  Psychiatric:        Mood and Affect: Mood normal.        Behavior: Behavior normal.      Labs and Radiology:    PET/CT imaging from February 22, 2020 through October 01, 2020 was reviewed.   IMPRESSION: 1. Slight decrease in size of a left upper quadrant mass which demonstrates similar mild hypermetabolism. 2. A hypermetabolic ileocolic mesenteric node is slightly increased in size since the prior exam, suspicious metastatic disease. This could alternatively be reactive, given the appearance of the colon. 3. Chronic sigmoid wall thickening and diffuse colonic hypermetabolism in the setting of marked diverticulosis. Likely muscular hypertrophy and chronic diverticulitis. Correlate with any symptoms to suggest acute superimposed inflammation. Also correlate with colon cancer screening history to exclude underlying neoplasm. 4. Incidental findings, including: Fibroid uterus. Aortic atherosclerosis (ICD10-I70.0), coronary  artery atherosclerosis and emphysema (ICD10-J43.9). Pulmonary artery enlargement suggests pulmonary arterial hypertension.   Initial SUV was 6.2.  On the last 2 exams this had fallen to 3.3 and 3.4 respectively.   The mass was originally approximately 10.2 cm in maximal diameter, now less than 8.  Central necrosis was suggested on the initial exam with no increased uptake in the midportion, fairly mild heterogeneous uptake in the small bowel mesenteric masses appreciated.   September 19, 2020 laboratory:     Ref Range & Units 3 wk ago   WBC 4.0 - 10.5 K/uL 9.2   RBC 3.87 - 5.11 MIL/uL 4.02   Hemoglobin 12.0 - 15.0 g/dL 11.3 Low    HCT 36.0 - 46.0 % 34.9 Low    MCV 80.0 -  100.0 fL 86.8   MCH 26.0 - 34.0 pg 28.1   MCHC 30.0 - 36.0 g/dL 32.4   RDW 11.5 - 15.5 % 16.0 High    Platelets 150 - 400 K/uL 262   nRBC 0.0 - 0.2 % 0.0   Neutrophils Relative % % 88   Neutro Abs 1.7 - 7.7 K/uL 8.1 High    Lymphocytes Relative % 6   Lymphs Abs 0.7 - 4.0 K/uL 0.6 Low    Monocytes Relative % 6   Monocytes Absolute 0.1 - 1.0 K/uL 0.5   Eosinophils Relative % 0   Eosinophils Absolute 0.0 - 0.5 K/uL 0.0   Basophils Relative % 0   Basophils Absolute 0.0 - 0.1 K/uL 0.0   Immature Granulocytes % 0   Abs Immature Granulocytes 0.00 - 0.07 K/uL 0.03     Component Ref Range & Units 3 wk ago   Sodium 135 - 145 mmol/L 137   Potassium 3.5 - 5.1 mmol/L 4.0   Chloride 98 - 111 mmol/L 102   CO2 22 - 32 mmol/L 24   Glucose, Bld 70 - 99 mg/dL 131 High    Comment: Glucose reference range applies only to samples taken after fasting for at least 8 hours.  BUN 8 - 23 mg/dL 15   Creatinine, Ser 0.44 - 1.00 mg/dL 0.73   Calcium 8.9 - 10.3 mg/dL 8.7 Low    Total Protein 6.5 - 8.1 g/dL 7.2   Albumin 3.5 - 5.0 g/dL 3.7   AST 15 - 41 U/L 17   ALT 0 - 44 U/L 12   Alkaline Phosphatase 38 - 126 U/L 49   Total Bilirubin 0.3 - 1.2 mg/dL 0.3   GFR, Estimated >60 mL/min >60         July 08, 2019 VQ scan:    IMPRESSION: 1. No perfusion defect is identified to indicate pulmonary embolus.   July 05, 2020 cardiology evaluation by Loralie Champagne, MD   Assessment/Plan: 1. RV failure: Echo in 1/21 showed significant RV dysfunction with severe pulmonary hypertension and D-shaped interventricular septum.  Based on RHC, it looks like RV failure is due to pulmonary arterial hypertension rather than LV failure (normal PCWP).  Repeated echo today showed EF 60-65%, low normal RV function, mild RV enlargement, PASP 44 mmHg. She is not significantly volume overloaded on exam today.   - Continue current Lasix.  BMET/BNP today.   2. Pulmonary HTN: Echo in 1/21 with RV failure and estimate PA systolic pressure 80 mmHg.  RHC in 3/21 showed pulmonary arterial hypertension.  Serologic workup was negative.  V/Q scan showed no chronic PE.  HRCT showed no ILD, mild-moderate emphysema.  PFTs suggestive of moderate-severe COPD. 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  She has been symptomatically improved with selective pulmonary vasodilators.   - Continue to use Bipap at night, oxygen during the day. - Continue Opsumit 10 mg daily. - Continue tadalafil 40 mg daily.  - Given improvement with Opsumit and tadalafil, I will add Tyvaso (inhaled so may limit V/Q mismatch in the setting of some degree of pulmonary parenchymal disease).  - Check BNP today.   3. H/o DVT: 2018.  She has IVC filter and is not anticoagulated due to h/o anemia. 4. Type 2 diabetes: She is now on Farxiga. 5. Chronic hypoxemic respiratory failure: Home oxygen.  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  Suspect pulmonary arterial hypertension also  plays a role. - Continue home oxygen.    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   Insulin-dependent diabetes mellitus.   Ongoing therapy with  metformin and [place].   Pulmonary hypertension.      Plan:     Typically, GIST did not invade adjacent organs, but with the close proximity of the colon and CT findings suggesting of chronic diverticulitis /diverticulosis I would do plan on having the patient complete a bowel prep in the event a simultaneous resection is indicated.  I did explain to the patient and her children that the goal is to reach a tumor free resection and if this can safely be done, even if it means adjacent organs need to be sacrificed, this will be undertaken.  There is nothing to suggest retroperitoneal involvement, and I think the lowest likely adjacent organ would be the colon.   The most recent scan suggests a slight enlargement of the mesenteric node, this goes along with the stable size over the last 2 exam suggesting maximum effective from [facility] therapy.   The patient is at high risk, having been told in the past she was "too sick" to have even a screening colonoscopy.  Are obviously in a different situation here and that her tumor is likely been responsible for her anemia and that this is anything but an elective procedure.   Plans to make use of a TAPP block prior to surgery to minimize narcotic needs was reviewed.   The very great importance of early ambulation was discussed in detail.       The patient will be asked to hold her usual dose of Farxiga the day of the prep and procedure to minimize dehydration.   The patient will hold her metformin the day of prep and procedure.   She will decrease her evening Lantus injection to 20 units the night prior to prep and surgery.   Sliding scale insulin as needed during the day.      This note is partially prepared by Karie Fetch, RN, acting as a scribe in the presence of Dr. Hervey Ard, MD.  The documentation recorded by the scribe accurately reflects the service I personally performed and the decisions made by me.    Robert Bellow, MD  FACS

## 2020-10-19 ENCOUNTER — Encounter: Payer: Self-pay | Admitting: Internal Medicine

## 2020-10-19 ENCOUNTER — Other Ambulatory Visit: Payer: Self-pay | Admitting: Internal Medicine

## 2020-10-19 ENCOUNTER — Inpatient Hospital Stay: Payer: Medicare Other

## 2020-10-19 ENCOUNTER — Inpatient Hospital Stay (HOSPITAL_BASED_OUTPATIENT_CLINIC_OR_DEPARTMENT_OTHER): Payer: Medicare Other | Admitting: Internal Medicine

## 2020-10-19 ENCOUNTER — Inpatient Hospital Stay: Payer: Medicare Other | Attending: Internal Medicine

## 2020-10-19 ENCOUNTER — Other Ambulatory Visit: Payer: Self-pay

## 2020-10-19 ENCOUNTER — Other Ambulatory Visit (HOSPITAL_COMMUNITY): Payer: Self-pay

## 2020-10-19 DIAGNOSIS — C49A3 Gastrointestinal stromal tumor of small intestine: Secondary | ICD-10-CM

## 2020-10-19 DIAGNOSIS — Z87891 Personal history of nicotine dependence: Secondary | ICD-10-CM | POA: Diagnosis not present

## 2020-10-19 DIAGNOSIS — D509 Iron deficiency anemia, unspecified: Secondary | ICD-10-CM | POA: Diagnosis not present

## 2020-10-19 DIAGNOSIS — E611 Iron deficiency: Secondary | ICD-10-CM

## 2020-10-19 DIAGNOSIS — Z79899 Other long term (current) drug therapy: Secondary | ICD-10-CM | POA: Diagnosis not present

## 2020-10-19 DIAGNOSIS — Z808 Family history of malignant neoplasm of other organs or systems: Secondary | ICD-10-CM | POA: Diagnosis not present

## 2020-10-19 DIAGNOSIS — K5730 Diverticulosis of large intestine without perforation or abscess without bleeding: Secondary | ICD-10-CM | POA: Insufficient documentation

## 2020-10-19 DIAGNOSIS — I2510 Atherosclerotic heart disease of native coronary artery without angina pectoris: Secondary | ICD-10-CM | POA: Insufficient documentation

## 2020-10-19 DIAGNOSIS — J432 Centrilobular emphysema: Secondary | ICD-10-CM | POA: Diagnosis not present

## 2020-10-19 DIAGNOSIS — Z8249 Family history of ischemic heart disease and other diseases of the circulatory system: Secondary | ICD-10-CM | POA: Insufficient documentation

## 2020-10-19 DIAGNOSIS — D259 Leiomyoma of uterus, unspecified: Secondary | ICD-10-CM | POA: Insufficient documentation

## 2020-10-19 DIAGNOSIS — R1902 Left upper quadrant abdominal swelling, mass and lump: Secondary | ICD-10-CM | POA: Insufficient documentation

## 2020-10-19 DIAGNOSIS — R5383 Other fatigue: Secondary | ICD-10-CM | POA: Diagnosis not present

## 2020-10-19 DIAGNOSIS — M791 Myalgia, unspecified site: Secondary | ICD-10-CM | POA: Insufficient documentation

## 2020-10-19 DIAGNOSIS — I110 Hypertensive heart disease with heart failure: Secondary | ICD-10-CM | POA: Diagnosis not present

## 2020-10-19 DIAGNOSIS — R0602 Shortness of breath: Secondary | ICD-10-CM | POA: Insufficient documentation

## 2020-10-19 DIAGNOSIS — I700 Atherosclerosis of aorta: Secondary | ICD-10-CM | POA: Insufficient documentation

## 2020-10-19 DIAGNOSIS — Z8042 Family history of malignant neoplasm of prostate: Secondary | ICD-10-CM | POA: Insufficient documentation

## 2020-10-19 DIAGNOSIS — I509 Heart failure, unspecified: Secondary | ICD-10-CM | POA: Insufficient documentation

## 2020-10-19 LAB — CBC WITH DIFFERENTIAL/PLATELET
Abs Immature Granulocytes: 0.03 10*3/uL (ref 0.00–0.07)
Basophils Absolute: 0 10*3/uL (ref 0.0–0.1)
Basophils Relative: 0 %
Eosinophils Absolute: 0 10*3/uL (ref 0.0–0.5)
Eosinophils Relative: 0 %
HCT: 34.5 % — ABNORMAL LOW (ref 36.0–46.0)
Hemoglobin: 11 g/dL — ABNORMAL LOW (ref 12.0–15.0)
Immature Granulocytes: 0 %
Lymphocytes Relative: 8 %
Lymphs Abs: 0.6 10*3/uL — ABNORMAL LOW (ref 0.7–4.0)
MCH: 28.6 pg (ref 26.0–34.0)
MCHC: 31.9 g/dL (ref 30.0–36.0)
MCV: 89.8 fL (ref 80.0–100.0)
Monocytes Absolute: 0.5 10*3/uL (ref 0.1–1.0)
Monocytes Relative: 7 %
Neutro Abs: 6.2 10*3/uL (ref 1.7–7.7)
Neutrophils Relative %: 85 %
Platelets: 252 10*3/uL (ref 150–400)
RBC: 3.84 MIL/uL — ABNORMAL LOW (ref 3.87–5.11)
RDW: 17.6 % — ABNORMAL HIGH (ref 11.5–15.5)
WBC: 7.4 10*3/uL (ref 4.0–10.5)
nRBC: 0 % (ref 0.0–0.2)

## 2020-10-19 LAB — COMPREHENSIVE METABOLIC PANEL
ALT: 13 U/L (ref 0–44)
AST: 18 U/L (ref 15–41)
Albumin: 3.8 g/dL (ref 3.5–5.0)
Alkaline Phosphatase: 50 U/L (ref 38–126)
Anion gap: 6 (ref 5–15)
BUN: 21 mg/dL (ref 8–23)
CO2: 25 mmol/L (ref 22–32)
Calcium: 8.7 mg/dL — ABNORMAL LOW (ref 8.9–10.3)
Chloride: 104 mmol/L (ref 98–111)
Creatinine, Ser: 0.74 mg/dL (ref 0.44–1.00)
GFR, Estimated: >60 mL/min (ref >60)
Glucose, Bld: 82 mg/dL (ref 70–99)
Potassium: 4 mmol/L (ref 3.5–5.1)
Sodium: 135 mmol/L (ref 135–145)
Total Bilirubin: 0.6 mg/dL (ref 0.3–1.2)
Total Protein: 7.4 g/dL (ref 6.5–8.1)

## 2020-10-19 MED ORDER — SODIUM CHLORIDE 0.9 % IV SOLN
Freq: Once | INTRAVENOUS | Status: AC
  Filled 2020-10-19: qty 250

## 2020-10-19 MED ORDER — IRON SUCROSE 20 MG/ML IV SOLN
200.0000 mg | Freq: Once | INTRAVENOUS | Status: AC
Start: 2020-10-19 — End: 2020-10-19
  Administered 2020-10-19: 200 mg via INTRAVENOUS
  Filled 2020-10-19: qty 10

## 2020-10-19 MED ORDER — IMATINIB MESYLATE 100 MG PO TABS
ORAL_TABLET | ORAL | 1 refills | Status: DC
  Filled 2020-10-19: qty 120, 30d supply, fill #0

## 2020-10-19 NOTE — Progress Notes (Signed)
[facility] NOTE  Patient Care Team: Albina Billet, MD as PCP - General (Unknown Physician Specialty) Wellington Hampshire, MD as PCP - Cardiology (Cardiology) Christene Lye, MD (General Surgery) Albina Billet, MD (Internal Medicine) Delana Meyer, Dolores Lory, MD (Vascular Surgery) Wilford Corner, MD as Consulting Physician (Gastroenterology) Cammie Sickle, MD as Consulting Physician (Hematology and Oncology)  CHIEF COMPLAINTS/PURPOSE OF CONSULTATION: Anemia   HEMATOLOGY HISTORY  Oncology History Overview Note  NOV 2021- CT Bx- TUMOR  Tumor Site: Peritoneum, jejunal  Histologic Type: Gastrointestinal stromal tumor, spindle cell type  Mitotic Rate: 1 per 5 mm squared  Histologic Grade: G1, low grade (mitotic rate less than or equal to 5  per 5 mm squared)- STAGE T2N1 [possible mesneteric LN]; CT A/P-9.8 x9.8 cm  # NOV 29th 2021- GLEEVEC 400 mg/day.     # CHRONIC ANEMIA EGD/capsule-WNL-2018 [Dr.vanga]-UGIB ; colonoscopy-2018 [[facility]] ; capsule-not done; hemoglobin around 8.8; iron saturation 5% [Dr.Tate].  Poor tolerance of p.o. 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   GIST (gastrointestinal stromal tumor) of small bowel, malignant ([facility])  03/12/2020 Initial Diagnosis   GIST (gastrointestinal stromal tumor) of small bowel, malignant (HCC)      HISTORY OF PRESENTING ILLNESS:  Susan Barajas 75 y.o.  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  Patient denies any diarrhea.  Denies any swelling in the legs.  Denies any nausea vomiting headache.  Review of Systems  Constitutional:  Positive for malaise/fatigue. Negative for chills, diaphoresis, fever and weight loss.  HENT:  Negative for nosebleeds and sore throat.   Eyes:  Negative for double vision.  Respiratory:  Positive for shortness of breath. Negative for cough, hemoptysis, sputum production and wheezing.   Cardiovascular:  Negative for chest pain, palpitations, orthopnea and leg swelling.  Gastrointestinal:  Negative for abdominal pain, blood in stool, constipation, diarrhea, heartburn, melena, nausea and vomiting.  Genitourinary:  Negative for dysuria, frequency and urgency.  Musculoskeletal:  Negative for back pain and joint pain.  Skin: Negative.  Negative for itching and rash.  Neurological:  Negative for dizziness, tingling, focal weakness, weakness and headaches.  Endo/Heme/Allergies:  Does not bruise/bleed easily.  Psychiatric/Behavioral:  Negative for depression. The patient is not nervous/anxious and does not have insomnia.    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 Vira Agar   COLONOSCOPY  Jan 2016   Dr Vira Agar   ESOPHAGOGASTRODUODENOSCOPY N/A 11/26/2016   Procedure: ESOPHAGOGASTRODUODENOSCOPY (EGD);  Surgeon: Lin Landsman,  MD;  Location: [facility] ENDOSCOPY;  Service: Gastroenterology;  Laterality: N/A;   GIVENS CAPSULE STUDY N/A 11/26/2016   Procedure: GIVENS CAPSULE STUDY, if EGD negative, plan to drop capsule with scope if EGD negative;  Surgeon: Lin Landsman, MD;  Location[facility] ENDOSCOPY;  Service: Gastroenterology;  Laterality: N/A;   IVC FILTER INSERTION N/A 11/14/2016   Procedure: IVC Filter Insertion;  Surgeon: Katha Cabal, MD;  Location: [place] CV LAB;  Service: Cardiovascular;  Laterality: N/A;   RIGHT/LEFT HEART CATH AND CORONARY ANGIOGRAPHY N/A 06/30/2019   Procedure: RIGHT/LEFT HEART CATH AND CORONARY ANGIOGRAPHY;  Surgeon: Larey Dresser, MD;  Location: [place] CV LAB;  Service: Cardiovascular;  Laterality: N/A;   VEIN SURGERY Right 2006   Vein Closure Procedure; RF ablation of right GSV    SOCIAL HISTORY: Social History   Socioeconomic History   Marital status: Single    Spouse name: Not on file   Number of children: 2   Years of education: Not on file   Highest education level: Not on file  Occupational History   Occupation: CELL    Employer: RETIRED  Tobacco Use   Smoking status: Former    Packs/day: 1.50    Years: 20.00    Pack years: 30.00    Types: Cigarettes    Quit date: 1999    Years since quitting: 23.5   Smokeless tobacco: Never  Vaping Use   Vaping Use: Never used  Substance and Sexual Activity   Alcohol use: No    Alcohol/week: 0.0 standard drinks   Drug use: No   Sexual activity: Not Currently  Other Topics Concern   Not on file  Social History Narrative   Independent at baseline. Lives at home with her brother; in [place]. Quit smoking 20 years; no alcohol.    Social Determinants of Health   Financial Resource Strain: Not on file  Food Insecurity: Not on file  Transportation Needs: Not on file  Physical Activity: Not on file  Stress: Not on file  Social Connections: Not on file  Intimate Partner Violence: Not on file    FAMILY  HISTORY: Family History  Problem Relation Age of Onset   Prostate cancer Father    Other Sister        mouth cancer   Hypertension Mother     ALLERGIES:  has No Known Allergies.  MEDICATIONS:  Current Outpatient Medications  Medication Sig Dispense Refill   ACCU-CHEK AVIVA PLUS test strip 1 each 2 (two) times daily.     Accu-Chek Softclix Lancets lancets USE 1 TO CHECK GLUCOSE TWICE DAILY     acetaminophen (TYLENOL) 325 MG tablet Take 2 tablets (650 mg total) by mouth every 6 (six) hours as needed for mild pain (or Fever >/= 101).     aspirin 81 MG chewable tablet Chew 1 tablet (81 mg total) by mouth daily. 30 tablet 0   Calcium Carb-Cholecalciferol (CALCIUM/VITAMIN D PO) Take 3 tablets by mouth daily.     dapagliflozin propanediol (FARXIGA) 10 MG TABS tablet TAKE 1 TABLET BY MOUTH ONCE DAILY (Patient taking differently: Take 10 mg by mouth daily.) 90 tablet 3   ferrous gluconate (FERGON) 325 MG tablet Take 325  mg by mouth in the morning and at bedtime.      furosemide (LASIX) 40 MG tablet Take 1 tablet (40 mg total) by mouth in the morning AND 0.5 tablets (20 mg total) every evening. (Patient not taking: Reported on 10/19/2020) 45 tablet 3   HUMALOG KWIKPEN 100 UNIT/ML KiwkPen Inject 10 Units into the skin 2 (two) times daily.      imatinib (GLEEVEC) 100 MG tablet TAKE 4 TABLETS (400 MG TOTAL) BY MOUTH DAILY. TAKE WITH MEALS AND LARGE GLASS OF WATER (Patient taking differently: Take 400 mg by mouth daily.) 120 tablet 1   insulin glargine (LANTUS) 100 UNIT/ML injection Inject 32 Units into the skin in the morning.     latanoprost (XALATAN) 0.005 % ophthalmic solution Place 1 drop into both eyes at bedtime.      lovastatin (MEVACOR) 20 MG tablet Take 20 mg by mouth every evening.      metFORMIN (GLUCOPHAGE) 1000 MG tablet Take 1,000 mg by mouth daily.      Multiple Vitamin (MULTIVITAMIN) tablet Take 1 tablet by mouth daily.     omeprazole (PRILOSEC) 40 MG capsule Take 40 mg by mouth daily.      ondansetron (ZOFRAN) 8 MG tablet One pill every 8 hours as needed for nausea/vomitting. (Patient taking differently: Take 8 mg by mouth every 8 (eight) hours as needed for nausea or vomiting.) 45 tablet 1   OPSUMIT 10 MG tablet Take 1 tablet daily. (Patient taking differently: Take 10 mg by mouth daily.) 30 tablet 11   potassium chloride SA (KLOR-CON) 20 MEQ tablet Take 1 tablet (20 mEq total) by mouth daily. 90 tablet 3   STIOLTO RESPIMAT 2.5-2.5 MCG/ACT AERS INHALE 2 PUFFS BY MOUTH ONCE DAILY (Patient taking differently: Inhale 2 puffs into the lungs daily.) 4 g 5   tadalafil, PAH, (ALYQ) 20 MG tablet Take 2 tablets (40 mg total) by mouth daily. 60 tablet 11   timolol (TIMOPTIC) 0.5 % ophthalmic solution Place 1 drop into both eyes 2 (two) times daily.      furosemide (LASIX) 20 MG tablet Take 20-40 mg by mouth See admin instructions. Take 40 mg by mouth in the morning and 20 mg in the evening     No current facility-administered medications for this visit.      PHYSICAL EXAMINATION:   Vitals:   10/19/20 1300  BP: 115/64  Pulse: 64  Resp: 18  Temp: 97.6 F (36.4 C)   Filed Weights   10/19/20 1300  Weight: 195 lb (88.5 kg)    Physical Exam Constitutional:      Comments: 6 L of oxygen nasal cannula.  Walk independently. She is alone.  HENT:     Head: Normocephalic and atraumatic.     Mouth/Throat:     Pharynx: No oropharyngeal exudate.  Eyes:     Pupils: Pupils are equal, round, and reactive to light.  Cardiovascular:     Rate and Rhythm: Normal rate and regular rhythm.  Pulmonary:     Effort: No respiratory distress.     Breath sounds: No wheezing.     Comments: Decreased breath sounds. Abdominal:     General: Bowel sounds are normal. There is no distension.     Palpations: Abdomen is soft. There is no mass.     Tenderness: no abdominal tenderness There is no guarding or rebound.  Musculoskeletal:        General: No tenderness. Normal range of motion.      Cervical back:  Normal range of motion and neck supple.  Skin:    General: Skin is warm.  Neurological:     Mental Status: She is alert and oriented to person, place, and time.  Psychiatric:        Mood and Affect: Affect normal.    LABORATORY DATA:  I have reviewed the data as listed Lab Results  Component Value Date   WBC 7.4 10/19/2020   HGB 11.0 (L) 10/19/2020   HCT 34.5 (L) 10/19/2020   MCV 89.8 10/19/2020   PLT 252 10/19/2020   Recent Labs    10/28/19 1213 02/10/20 1246 08/01/20 1236 09/04/20 1151 09/19/20 1020 10/19/20 1250  NA 138   < > 138 138 137 135  K 4.2   < > 3.9 3.9 4.0 4.0  CL 107   < > 102 104 102 104  CO2 25   < > _0 GLUCOSE 103*   < > 72 59* 131* 82  BUN 14   < > _1 CREATININE 0.78   < > 0.93 0.75 0.73 0.74  CALCIUM 8.6*   < > 9.2 8.9 8.7* 8.7*  GFRNONAA >60   < > >60 >60 >60 >60  GFRAA >60  --   --   --   --   --   PROT  --    < > 7.6  --  7.2 7.4  ALBUMIN  --    < > 4.0  --  3.7 3.8  AST  --    < > 19  --  17 18  ALT  --    < > 14  --  12 13  ALKPHOS  --    < > 55  --  49 50  BILITOT  --    < > 0.4  --  0.3 0.6   < > = values in this interval not displayed.     NM PET Image Restag (PS) Skull Base To Thigh  Result Date: 10/02/2020 CLINICAL DATA:  Subsequent treatment strategy for GI stromal tumor of small bowel. Evaluate treatment response. No chemotherapy or radiation therapy. COVID vaccine, most recent dose in left arm in November/December. EXAM: NUCLEAR MEDICINE PET SKULL BASE TO THIGH TECHNIQUE: 11.4 mCi F-18 FDG was injected intravenously. Full-ring PET imaging was performed from the skull base to thigh after the radiotracer. CT data was obtained and used for attenuation correction and anatomic localization. Fasting blood glucose: 107 mg/dl COMPARISON:  05/31/2020 PET FINDINGS: Mediastinal blood pool activity: SUV max 1.7 Liver activity: SUV max NA NECK: No areas of abnormal hypermetabolism. Incidental CT findings: No  cervical adenopathy. Right posterior triangle 8 mm node is not pathologic by size criteria. CHEST: No pulmonary parenchymal or thoracic nodal hypermetabolism. Incidental CT findings: Mild cardiomegaly. Pulmonary artery enlargement, outflow tract 3.2 cm. Aortic and coronary artery calcification. Centrilobular emphysema. ABDOMEN/PELVIS: Left upper quadrant abdominal mass measures 7.6 x 7.2 cm on 166/3 (previously 8.8 x 7.9 cm). SUV max 3.4 (previously 3.3). An ileocolic mesenteric node measures 1.7 cm and a S.U.V. max of 4.7 on 195/3 (previously 1.4 cm and a S.U.V. max of 4.5). This node is similar in size and hypermetabolic back on the PET of 02/22/2020. Chronic hypermetabolism and wall thickening involves the colon, most significant in the sigmoid. Concurrent extensive colonic diverticulosis. Pericolonic soft tissue thickening is felt to be chronic. Incidental CT findings: Normal adrenal glands. IVC filter appropriately position. Fibroid uterus. Pelvic floor laxity. SKELETON: Left glenohumeral  joint hypermetabolism and apparent synovial thickening, suggesting arthritis. This hypermetabolism is new or significantly increased. No findings to suggest hypermetabolic osseous metastasis. Incidental CT findings: none IMPRESSION: 1. Slight decrease in size of a left upper quadrant mass which demonstrates similar mild hypermetabolism. 2. A hypermetabolic ileocolic mesenteric node is slightly increased in size since the prior exam, suspicious metastatic disease. This could alternatively be reactive, given the appearance of the colon. 3. Chronic sigmoid wall thickening and diffuse colonic hypermetabolism in the setting of marked diverticulosis. Likely muscular hypertrophy and chronic diverticulitis. Correlate with any symptoms to suggest acute superimposed inflammation. Also correlate with colon cancer screening history to exclude underlying neoplasm. 4. Incidental findings, including: Fibroid uterus. Aortic atherosclerosis  (ICD10-I70.0), coronary artery atherosclerosis and emphysema (ICD10-J43.9). Pulmonary artery enlargement suggests pulmonary arterial hypertension. Electronically Signed   By: Abigail Miyamoto M.D.   On: 10/02/2020 11:42     GIST (gastrointestinal stromal tumor) of small bowel, malignant ([facility]) # GIST of small bowel-low grade STAGE II [T3N0] vs  IV [N1] [low Mitotic index; T=9.8cm- T3; N1- ? mesneteric LN] [s/p IR biopsy]. Localized disease on Gleevec 400 milligrams [nov 28th 2021]-JUNE 2022- Slight decrease in size of a left upper quadrant mass which demonstrates similar mild hypermetabolism. A hypermetabolic ileocolic mesenteric node is slightly increased in size since the prior exam, suspicious metastatic disease. Continue Gleevec [6 months June 2022]; surgery on 7/25.  Recommend holding [place] 1 week prior to surgery.  And restarting Gleevec 1 week after surgery.  #CHF/COPD home O2 4-6Lits;-borderline respiratory status-STABLE.   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  Today hemoglobin is  11.3;    Proceed with Venofer today.  # Myalgias- recommend ca+vit D; tylenol as needed.   # DISPOSITION:.   # venofer today # Follow up in 55month  MD; labs-cbc/cmp;possible venofer;Dr.B  # I reviewed the blood work- with the patient/son in detail; also reviewed the imaging independently [as summarized above]; and with the patient in detail.  Discussed with Dr. BBary Castilla    All questions were answered. The patient knows to call the clinic with any problems, questions or concerns.    GCammie Sickle MD 10/21/2020 8:59 PM

## 2020-10-19 NOTE — Assessment & Plan Note (Addendum)
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 mesneteric LN] [s/p IR biopsy]. Localized disease on Gleevec 400 milligrams [nov 28th 2021]-JUNE 2022- Slight decrease in size of a left upper quadrant mass which demonstrates similar mild hypermetabolism. A hypermetabolic ileocolic mesenteric node is slightly increased in size since the prior exam, suspicious metastatic disease. Continue Gleevec [6 months June 2022]; surgery on 7/25.  Recommend holding [place] 1 week prior to surgery.  And restarting Gleevec 1 week after surgery.  #CHF/COPD home O2 4-6Lits;-borderline respiratory status-STABLE.   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  Today hemoglobin is  11.3;    Proceed with Venofer today.  # Myalgias- recommend ca+vit D; tylenol as needed.   # DISPOSITION:.   # venofer today # Follow up in 85month  MD; labs-cbc/cmp;possible venofer;Dr.B  # I reviewed the blood work- with the patient/son in detail; also reviewed the imaging independently [as summarized above]; and with the patient in detail.  Discussed with Dr. BBary Castilla

## 2020-10-19 NOTE — Patient Instructions (Signed)
#  Stop taking the [place] 1 week before surgery; can start back on Gleevec 1 week after surgery.

## 2020-10-19 NOTE — Patient Instructions (Signed)

## 2020-10-21 ENCOUNTER — Encounter: Payer: Self-pay | Admitting: Internal Medicine

## 2020-10-24 ENCOUNTER — Other Ambulatory Visit (HOSPITAL_COMMUNITY): Payer: Self-pay

## 2020-10-26 ENCOUNTER — Telehealth: Payer: Self-pay | Admitting: *Deleted

## 2020-10-26 ENCOUNTER — Other Ambulatory Visit: Payer: Self-pay

## 2020-10-26 ENCOUNTER — Other Ambulatory Visit
Admission: RE | Admit: 2020-10-26 | Discharge: 2020-10-26 | Disposition: A | Discharge status: Home / Self Care | Payer: Medicare Other | Source: Ambulatory Visit | Attending: General Surgery | Admitting: General Surgery

## 2020-10-26 HISTORY — DX: Gastro-esophageal reflux disease without esophagitis: K21.9

## 2020-10-26 HISTORY — DX: Chronic obstructive pulmonary disease, unspecified: J44.9

## 2020-10-26 HISTORY — DX: Pneumonia, unspecified organism: J18.9

## 2020-10-26 HISTORY — DX: Sleep apnea, unspecified: G47.30

## 2020-10-26 HISTORY — DX: Anemia, unspecified: D64.9

## 2020-10-26 NOTE — Telephone Encounter (Signed)
Request for pre-operative cardiac clearance Received: Today Karen Kitchens, NP  P Cv Div Preop Callback Request for pre-operative cardiac clearance:     1. What type of surgery is being performed?  PARTIAL COLECTOMY   2. When is this surgery scheduled?  11/05/2020     3. Are there any medications that need to be held prior to surgery?  ASA   4. Practice name and name of physician performing surgery?  Performing surgeon: Dr. Hervey Ard, MD  Requesting clearance: Honor Loh, FNP-C       5. Anesthesia type (none, local, MAC, general)? General + TAPP   6. What is the office phone and fax number?    Phone: [phone]  Fax: [phone]   ATTENTION: Unable to create telephone message as per your standard workflow. Directed by [facility] providers to send requests for cardiac clearance to this pool for appropriate distribution to provider covering pre-operative clearances.   Honor Loh, MSN, APRN, FNP-C, CEN  [place]  Peri-operative Services Nurse Practitioner  Phone: [phone]  10/26/20 2:03 PM

## 2020-10-26 NOTE — Telephone Encounter (Signed)
   Name: Susan Barajas  DOB: 1945-10-04  MRN: [id number]   Primary Cardiologist: Kathlyn Sacramento, MD  Chart reviewed as part of pre-operative protocol coverage. Patient was contacted 10/26/2020 in reference to pre-operative risk assessment for pending surgery as outlined below.  Susan Anna Franzoni was last seen on 09/04/20 by Dr. Aundra Dubin.  Since that day, Susan Anna Clarey has done fine from a cardiac standpoint. She has chronic DOE which has been stable. She can do household chores, walk indoors, and perform ADLs, though is limited in outdoor activities with her home O2 needs. She had a cardiac catheterization in 2021 which showed mild non-obstructive CAD. Most recent echo 08/2020 showed EF 60-65%, low normal RV function, mild RV enlargement, PASP 44 mmHg.  Would be reasonable to hold aspirin 1 week prior to her surgery if needed. Patient states she was told to hold the day before her procedure by the requesting team.  Dr. Aundra Dubin - given her pulmHTN history, just wanted to touch base with you regarding her preop risk. Overall seems to be stable. Do not feel any further cardiac work-up is needed at this time. Any specific recommendations for her surgery team? Please route your response back to P CV DIV PREOP. Thank you!  Abigail Butts, PA-C 10/26/2020, 2:58 PM

## 2020-10-26 NOTE — Patient Instructions (Addendum)
Your procedure is scheduled on: 11/05/20 - Monday Report to the Registration Desk on the 1st floor of the [facility]. To find out your arrival time, please call [phone] between 1PM - 3PM on: 11/02/20 -Friday Report to [facility] on 11/01/20 at 8:00 am for Labs and Covid Testing.  REMEMBER: Instructions that are not followed completely may result in serious medical risk, up to and including death; or upon the discretion of your surgeon and anesthesiologist your surgery may need to be rescheduled.  Do not eat food after midnight the night before surgery.  No gum chewing, lozengers or hard candies.  Follow Bowel Prep instructions given to you by Dr.Byrnett.  TAKE THESE MEDICATIONS THE MORNING OF SURGERY WITH A SIP OF WATER:  - omeprazole (PRILOSEC) 40 MG capsule - OPSUMIT 10 MG tablet - STIOLTO RESPIMAT 2.5-2.5 MCG/ACT AERS - tadalafil, PAH, (ALYQ) 20 MG tablet - timolol (TIMOPTIC) 0.5 % ophthalmic solution  STOP TAKING dapagliflozin propanediol (FARXIGA) 10 MG TABS tablet BEGINNING 11/02/20, MAY RESUME DAY AFTER SURGERY.  STOP TAKING metFORMIN (GLUCOPHAGE) 1000 MG tablet BEGINNING 07/23, MAY RESUME TAKING THE DAY AFTER SURGERY.  Use inhalers on the day of surgery and bring to the hospital.  Take 1/2 of usual insulin dose the night before surgery and none on the morning of surgery.  Follow recommendations from Cardiologist, Pulmonologist or PCP regarding stopping Aspirin, Coumadin, Plavix, Eliquis, Pradaxa, or Pletal.  One week prior to surgery: Stop Anti-inflammatories (NSAIDS) such as Advil, Aleve, Ibuprofen, Motrin, Naproxen, Naprosyn and Aspirin based products such as Excedrin, Goodys Powder, BC Powder.  Stop ANY OVER THE COUNTER supplements until after surgery.  You may take Tylenol if needed for pain up until the day of surgery.  No Alcohol for 24 hours before or after surgery.  No Smoking including e-cigarettes for 24 hours prior to surgery.  No chewable  tobacco products for at least 6 hours prior to surgery.  No nicotine patches on the day of surgery.  Do not use any "recreational" drugs for at least a week prior to your surgery.  Please be advised that the combination of cocaine and anesthesia may have negative outcomes, up to and including death. If you test positive for cocaine, your surgery will be cancelled.  On the morning of surgery brush your teeth with toothpaste and water, you may rinse your mouth with mouthwash if you wish. Do not swallow any toothpaste or mouthwash.  Do not wear jewelry, make-up, hairpins, clips or nail polish.  Do not wear lotions, powders, or perfumes.   Do not shave body from the neck down 48 hours prior to surgery just in case you cut yourself which could leave a site for infection.  Also, freshly shaved skin may become irritated if using the CHG soap.  Contact lenses, hearing aids and dentures may not be worn into surgery.  Do not bring valuables to the hospital. [facility] is not responsible for any missing/lost belongings or valuables.   Use CHG Soap or wipes as directed on instruction sheet.  Bring your C-PAP to the hospital with you in case you may have to spend the night.   Notify your doctor if there is any change in your medical condition (cold, fever, infection).  Wear comfortable clothing (specific to your surgery type) to the hospital.  After surgery, you can help prevent lung complications by doing breathing exercises.  Take deep breaths and cough every 1-2 hours. Your doctor may order a device called an Chiropodist  to help you take deep breaths. When coughing or sneezing, hold a pillow firmly against your incision with both hands. This is called "splinting." Doing this helps protect your incision. It also decreases belly discomfort.  If you are being admitted to the hospital overnight, leave your suitcase in the car. After surgery it may be brought to your room.  If you are  being discharged the day of surgery, you will not be allowed to drive home. You will need a responsible adult (18 years or older) to drive you home and stay with you that night.   If you are taking public transportation, you will need to have a responsible adult (18 years or older) with you. Please confirm with your physician that it is acceptable to use public transportation.   Please call the [facility] Dept. at [phone] if you have any questions about these instructions.  Surgery Visitation Policy:  Patients undergoing a surgery or procedure may have one family member or support person with them as long as that person is not COVID-19 positive or experiencing its symptoms.  That person may remain in the waiting area during the procedure.  Inpatient Visitation:    Visiting hours are 7 a.m. to 8 p.m. Inpatients will be allowed two visitors daily. The visitors may change each day during the patient's stay. No visitors under the age of 91. Any visitor under the age of 67 must be accompanied by an adult. The visitor must pass COVID-19 screenings, use hand sanitizer when entering and exiting the patient's room and wear a mask at all times, including in the patient's room. Patients must also wear a mask when staff or their visitor are in the room. Masking is required regardless of vaccination status.

## 2020-10-29 NOTE — Telephone Encounter (Signed)
Primary Cardiologist: Kathlyn Sacramento, MD  Chart reviewed as part of pre-operative protocol coverage. Given past medical history and time since last visit, based on ACC/AHA guidelines, Susan Barajas would be at acceptable risk for the planned procedure without further cardiovascular testing.   Her RCRI is a class III risk, 6.6% risk of major cardiac event.  Her aspirin may be held for 5 to 7 days prior to her surgery.  Please resume as soon as hemostasis is achieved.  I will route this recommendation to the requesting party via Epic fax function and remove from pre-op pool.  Please call with questions.  Jossie Ng. India Jolin NP-C    10/29/2020, 11:58 AM [address] Office [phone] Fax [phone]

## 2020-10-29 NOTE — Telephone Encounter (Signed)
I think she should be reasonable risk to have surgery

## 2020-10-31 ENCOUNTER — Encounter: Payer: Self-pay | Admitting: General Surgery

## 2020-10-31 NOTE — Progress Notes (Signed)
Perioperative Services  Pre-Admission/Anesthesia Testing Clinical Review  Date: 11/02/20  Patient Demographics:  Name: Susan Barajas DOB:   Jul 16, 1945 MRN:   [id number]  Planned Surgical Procedure(s):    Case: [id number] Date/Time: 11/05/20 0845   Procedure: PARTIAL COLECTOMY - small bowel resection, RNFA to assist; TAPP block per anesthesia   Anesthesia type: General   Pre-op diagnosis: GIST small bowel   Location: [facility] [facility]   Surgeons: Robert Bellow, MD     NOTE: Available PAT nursing documentation and vital signs have been reviewed. Clinical nursing staff has updated patient's PMH/PSHx, current medication list, and drug allergies/intolerances to ensure comprehensive history available to assist in medical decision making as it pertains to the aforementioned surgical procedure and anticipated anesthetic course. Extensive review of available clinical information performed. [place] PM[facility] and PSHx updated with any diagnoses/procedures that  may have been inadvertently omitted during her intake with the pre-admission testing department's nursing staff.  Clinical Discussion:  Susan Barajas is a 75 y.o. female who is submitted for pre-surgical anesthesia review and clearance prior to her undergoing the above procedure. Patient is a Former Smoker (30 pack years; quit 04/1997). Pertinent PMH includes: CHF, pulmonary hypertension, cor pulmonale, DVT, HTN, HLD, T2DM, asthma, COPD (requires supplemental oxygen), OSAH (requires nocturnal PAP therapy), GERD (on daily PPI), anemia, small bowel GIST tumor.   Patient is followed by cardiology Fletcher Anon, MD). She was last seen in the cardiology clinic on 09/04/2020; notes reviewed.  At the time of her clinic visit, patient doing well overall from a cardiovascular perspective.  She denied any chest pain, PND, orthopnea, palpitations, significant peripheral edema, vertiginous symptoms, or presyncope/syncope.   Patient with chronic dyspnea both at rest and with exertion secondary to her pulmonary hypertension and right heart failure.  Patient chronically using supplemental oxygen; 4L/[place] at rest and 6L/[place] with exertion.  PMH significant for cardiovascular diagnoses.  Myocardial perfusion imaging study performed on 06/04/2017 revealed a normal left ventricular systolic function with an EF of 55 to 65%.  There was a small defect noted at the apex location, however this was felt to be likely due to breast attenuation.  There was no evidence of stress-induced arrhythmia observed.  Study determined to be normal and low risk.  TTE performed on 04/20/2019 demonstrated hyperdynamic left ventricular systolic function with an EF of 65-70%.  RVSP severely elevated at 80.0 mmHg.  PASP severely elevated.  There was evidence of right ventricular volume and pressure overload.  Both the right atrium and ventricle were noted to be severely enlarged.  Diagnostic right/left heart catheterization performed on 06/30/2019 demonstrated moderate to severe PAH.  PCWP was normal.  RA pressure elevated.  Cardiac output preserved.  There was mild (30-40%) disease noted in the mid RCA.  Last TTE performed on 09/04/2020 revealed a normal left ventricular systolic function with an EF of 60 to 65%.  Diastolic parameters normal.  Average GLS -21.8%.  PASP mildly elevated.  RVSP 43.7 mmHg (see full interpretation of cardiovascular testing below).  Blood pressure well controlled at 132/52.  PAH and heart failure symptoms currently being managed on macitentan and tadalafil. She is on a statin for her HLD.  Patient reporting that T2DM well controlled on currently prescribed regimen; no recent Hgb A1c for review.  Patient had a DVT in 2018. She is not on chronic anticoagulation due to anemia; currently has an IVC filter in place. Functional capacity, as defined by DASI, is documented  as being >/= 4 METS.  No changes were made to patient's medication  regimen.  Patient to follow-up with outpatient cardiology in 3 months or sooner if needed.  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  Recent PET imaging demonstrated a hypermetabolic left upper quadrant abdominal mass and interval increase in a hypermetabolic ileocolic mesenteric node; findings suspicious for metastatic disease. Patient is scheduled to undergo a partial colectomy on 11/05/2020 with Dr. Hervey Ard, MD.  Given patient's past medical history significant for cardiovascular/cardiopulmonary diagnoses, presurgical clearances were sought by the PAT team from both cardiology and pulmonary medicine.  Specialty clearances were obtained as follows:  Per pulmonary medicine, ""patient with moderate COPD, chronic respiratory failure, pulmonary hypertension along with multiple comorbidities, which places her at a MODERATE to HIGH surgical risk, however she is not excluded from upcoming surgery.  Recommendations: Shortest duration of surgery possible, avoid paralytics, recovery and stepdown or ICU with pulmonary consultation if indicated, DVT prophylaxis if indicated, aggressive pulmonary toilet with O2, bronchodilation, incentive spirometry, and early ambulation.  Patient should take Stiolto and albuterol on the day of surgery.  She should continue BiPAP at bedtime".   Per cardiology, "RCRI is a class III risk, which is a 6.6% risk of MACE. Therefore, based on patient's past medical history and time since his last clinic visit, patient would be at an overall ACCEPTABLE risk for the planned procedure without further cardiovascular testing or intervention at this time".  This patient is on daily antiplatelet therapy.  She has been instructed on recommendations for holding her daily low-dose ASA for 5 to 7 days prior to her procedure with plans to restart as soon as postoperative bleeding risk felt to be minimized by primary attending surgeon.  Patient will call surgeons  office to discuss stop date.  Patient denies previous perioperative complications with anesthesia in the past. In review of the available records, it is noted that patient underwent a general anesthetic course here (ASA III) in 11/2016 without documented complications.   Vitals with BMI 11/02/2020 10/19/2020 09/19/2020  Height _0  _1  -  Weight 197 lbs 6 oz 195 lbs -  BMI 23.76 28.31 -  Systolic 98 517 616  Diastolic 64 64 51  Pulse 78 64 65  Some encounter information is confidential and restricted. Go to Review Flowsheets activity to see all data.    Providers/Specialists:   NOTE: Primary physician provider listed below. Patient may have been seen by APP or partner within same practice.   PROVIDER ROLE / SPECIALTY LAST OV  Bary Castilla Forest Gleason, MD General Surgery 10/18/2020  Albina Billet, MD Primary Care Provider ???  Marlyne Beards, MD Cardiology 09/04/2020  Charlaine Dalton, MD Oncology 10/19/2020   Allergies:  Patient has no known allergies.  Current Home Medications:   No current facility-administered medications for this encounter.    acetaminophen (TYLENOL) 325 MG tablet   aspirin 81 MG chewable tablet   Calcium Carb-Cholecalciferol (CALCIUM/VITAMIN D PO)   dapagliflozin propanediol (FARXIGA) 10 MG TABS tablet   ferrous gluconate (FERGON) 325 MG tablet   furosemide (LASIX) 20 MG tablet   HUMALOG KWIKPEN 100 UNIT/ML KiwkPen   imatinib (GLEEVEC) 100 MG tablet   insulin glargine (LANTUS) 100 UNIT/ML injection   latanoprost (XALATAN) 0.005 % ophthalmic solution   lovastatin (MEVACOR) 20 MG tablet   metFORMIN (GLUCOPHAGE) 1000 MG tablet   Multiple Vitamin (MULTIVITAMIN) tablet   omeprazole (PRILOSEC) 40 MG capsule   ondansetron (ZOFRAN)  8 MG tablet   OPSUMIT 10 MG tablet   potassium chloride SA (KLOR-CON) 20 MEQ tablet   STIOLTO RESPIMAT 2.5-2.5 MCG/ACT AERS   tadalafil, PAH, (ALYQ) 20 MG tablet   timolol (TIMOPTIC) 0.5 % ophthalmic solution   ACCU-CHEK AVIVA  PLUS test strip   Accu-Chek Softclix Lancets lancets   furosemide (LASIX) 40 MG tablet   History:   Past Medical History:  Diagnosis Date   Anemia    Asthma 1950   CHF (congestive heart failure) (HCC)    COPD (chronic obstructive pulmonary disease) (HCC)    Cor pulmonale (HCC)    DVT (deep venous thrombosis) ([place]) 2018   GERD (gastroesophageal reflux disease)    GIST (gastrointestinal stromal tumor) of small bowel, malignant (HCC)    Glaucoma 1949   Hyperlipidemia    Hypertension    Obesity, unspecified    OSA treated with BiPAP    Personal history of tobacco use, presenting hazards to health    Pneumonia    Presence of IVC filter    Pulmonary HTN ([place])    Supplemental oxygen dependent    T2DM (type 2 diabetes mellitus) ([place])    Varicose veins of lower extremities with other complications    Past Surgical History:  Procedure Laterality Date   BREAST BIOPSY Right 1991   BREAST BIOPSY Left 2013   COLONOSCOPY  2010   Dr. Vira Agar   COLONOSCOPY  Jan 2016   Dr Vira Agar   ESOPHAGOGASTRODUODENOSCOPY N/A 11/26/2016   Procedure: ESOPHAGOGASTRODUODENOSCOPY (EGD);  Surgeon: Lin Landsman, MD;  [facility];  Service: Gastroenterology;  Laterality: N/A;   GIVENS CAPSULE STUDY N/A 11/26/2016   Procedure: GIVENS CAPSULE STUDY, if EGD negative, plan to drop capsule with scope if EGD negative;  Surgeon: Lin Landsman, MD;  Location: [facility] ENDOSCOPY;  Service: Gastroenterology;  Laterality: N/A;   IVC FILTER INSERTION N/A 11/14/2016   Procedure: IVC Filter Insertion;  Surgeon: Katha Cabal, MD;  Location: [place] CV LAB;  Service: Cardiovascular;  Laterality: N/A;   RIGHT/LEFT HEART CATH AND CORONARY ANGIOGRAPHY N/A 06/30/2019   Procedure: RIGHT/LEFT HEART CATH AND CORONARY ANGIOGRAPHY;  Surgeon: Larey Dresser, MD;  Location: [place] [facility] LAB;  Service: Cardiovascular;  Laterality: N/A;   VEIN SURGERY Right 2006   Vein Closure Procedure; RF ablation of  right GSV   Family History  Problem Relation Age of Onset   Prostate cancer Father    Other Sister        mouth cancer   Hypertension Mother    Social History   Tobacco Use   Smoking status: Former    Packs/day: 1.50    Years: 20.00    Pack years: 30.00    Types: Cigarettes    Quit date: 1999    Years since quitting: 23.5   Smokeless tobacco: Never  Vaping Use   Vaping Use: Never used  Substance Use Topics   Alcohol use: No    Alcohol/week: 0.0 standard drinks   Drug use: No    Pertinent Clinical Results:  LABS: Labs reviewed: Acceptable for surgery.  Lab Results  Component Value Date   WBC 7.4 10/19/2020   HGB 11.0 (L) 10/19/2020   HCT 34.5 (L) 10/19/2020   MCV 89.8 10/19/2020   PLT 252 10/19/2020   Lab Results  Component Value Date   NA 135 10/19/2020   K 4.0 10/19/2020   CO2 25 10/19/2020   GLUCOSE 82 10/19/2020   BUN 21 10/19/2020  CREATININE 0.74 10/19/2020   CALCIUM 8.7 (L) 10/19/2020   GFRNONAA >60 10/19/2020   GFRAA >60 10/28/2019   Hospital Outpatient Visit on 11/01/2020  Component Date Value Ref Range Status   aPTT 11/01/2020 26  24 - 36 seconds Final   Performed at [facility], [address] [place], [place] [address]   Prothrombin Time 11/01/2020 12.8  11.4 - 15.2 seconds Final   INR 11/01/2020 1.0  0.8 - 1.2 Final   Comment: (NOTE) INR goal varies based on device and disease states. Performed at [facility], [address] [place], [place] [address]    ABO/RH(D) 11/01/2020 B POS   Final   Antibody Screen 11/01/2020 NEG   Final   Sample Expiration 11/01/2020 11/15/2020,2359   Final   Extend sample reason 11/01/2020    Final                   Value:NO TRANSFUSIONS OR PREGNANCY IN THE PAST 3 MONTHS Performed at [facility], [address] [place], [place] [address]    SARS Coronavirus 2 11/01/2020 NEGATIVE  NEGATIVE Final   Comment: (NOTE) SARS-CoV-2 target nucleic acids are NOT DETECTED.  The  SARS-CoV-2 RNA is generally detectable in upper and lower respiratory specimens during the acute phase of infection. Negative results do not preclude SARS-CoV-2 infection, do not rule out co-infections with other pathogens, and should not be used as the sole basis for treatment or other patient management decisions. Negative results must be combined with clinical observations, patient history, and epidemiological information. The expected result is Negative.  Fact Sheet for Patients: SugarRoll.be  Fact Sheet for Healthcare Providers: https://www.woods-mathews.com/  This test is not yet approved or cleared by the [place] FDA and  has been authorized for detection and/or diagnosis of SARS-CoV-2 by FDA under an Emergency Use Authorization (EUA). This EUA will remain  in effect (meaning this test can be used) for the duration of the COVID-19 declaration under Se                          ction 564(b)(1) of the Act, 21 U.S.C. section 360bbb-3(b)(1), unless the authorization is terminated or revoked sooner.  Performed at [facility], [place] [address], [place], [place] [address]     ECG: Date: 09/04/2020 Time ECG obtained: 1133 AM Rate: 59 bpm Rhythm: sinus bradycardia Axis (leads I and aVF): Normal Intervals: PR 160 ms. QRS 78 ms. QTc 413 ms. ST segment and T wave changes: Nonspecific ST and T wave abnormality  Comparison: Inferior and anterior TWIs present on 07/07/2019 tracing   IMAGING / PROCEDURES: TRANSTHORACIC ECHOCARDIOGRAM performed on 09/04/2020 Left ventricular ejection fraction, by estimation, is 60 to 65%. The left ventricle has normal function. The left ventricle has no regional wall motion abnormalities. Left ventricular diastolic parameters were normal. The average left ventricular global longitudinal strain is -21.8 %. The global longitudinal strain is normal.  Right ventricular systolic function is low normal. The  right ventricular size is mildly enlarged. There is mildly elevated pulmonary  artery systolic pressure. The estimated right ventricular systolic pressure is 49.4 mmHg.  Right atrial size was mildly dilated.  The mitral valve is normal in structure. Trivial mitral valve regurgitation. No evidence of mitral stenosis.  The aortic valve is tricuspid. Aortic valve regurgitation is not visualized. No aortic stenosis is present.  The inferior vena cava is normal in size with greater than 50% respiratory variability,  suggesting right atrial pressure of 3 mmHg.   PULMONARY FUNCTION TESTING performed on 07/13/2019 PFT Results Latest Ref Rng & Units 07/13/2019  FVC-Pre L 2.04  FVC-Predicted Pre % 82  FVC-Post L 2.26  FVC-Predicted Post % 91  Pre FEV1/FVC % % 56  Post FEV1/FCV % % 56  FEV1-Pre L 1.14  FEV1-Predicted Pre % 59  FEV1-Post L 1.28  DLCO uncorrected ml/min/mmHg 6.67  DLCO UNC% % 32  DLVA Predicted % 49  TLC L 5.01  TLC % Predicted % 93  RV % Predicted % 127   RIGHT/LEFT HEART CATHETERIZATION AND CORONARY ANGIOGRAPHY performed on 06/30/2019 Normal PCWP Elevated RA pressure Moderate to severe PAH Preserved cardiac output No evidence for L>R shunt 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 Available labs, pertinent testing, and imaging results were personally reviewed by me. This patient has been appropriately cleared by cardiology (ACCEPTABLE) and pulmonary medicine  (MODERATE to HIGH) with overall indicated risks of significant perioperative cardiovascular/cardiopulmonary complications.  Based on clinical review performed today (11/02/20), barring any significant acute changes in the patient's overall condition, it is anticipated that she will be able to proceed with the planned surgical intervention. Any acute changes in clinical condition may necessitate her procedure being postponed and/or cancelled. Patient will meet with anesthesia team (MD and/or CRNA) on the day of her procedure for preoperative evaluation/assessment. Questions regarding anesthetic course will be fielded at that time.   Pre-surgical instructions were reviewed with the patient during her PAT appointment and questions were fielded by PAT clinical staff. Patient was advised that if any questions or concerns arise prior to her procedure then she should return a call to PAT and/or her surgeon's office to discuss.  Honor Loh, MSN, APRN, FNP-C, CEN [facility]  Peri-operative Services Nurse Practitioner Phone: [phone] Fax: [phone] 11/02/20 3:27 PM  NOTE: This note has been prepared using Dragon dictation software. Despite my best ability to proofread, there is always the potential that unintentional transcriptional errors may still occur from this process.

## 2020-11-01 ENCOUNTER — Other Ambulatory Visit: Payer: Self-pay

## 2020-11-01 ENCOUNTER — Other Ambulatory Visit
Admission: RE | Admit: 2020-11-01 | Discharge: 2020-11-01 | Disposition: A | Discharge status: Home / Self Care | Payer: Medicare Other | Source: Ambulatory Visit | Attending: General Surgery | Admitting: General Surgery

## 2020-11-01 DIAGNOSIS — Z01812 Encounter for preprocedural laboratory examination: Secondary | ICD-10-CM | POA: Insufficient documentation

## 2020-11-01 DIAGNOSIS — Z20822 Contact with and (suspected) exposure to covid-19: Secondary | ICD-10-CM | POA: Insufficient documentation

## 2020-11-01 LAB — TYPE AND SCREEN
ABO/RH(D): B POS
Antibody Screen: NEGATIVE

## 2020-11-01 LAB — PROTIME-INR
INR: 1 (ref 0.8–1.2)
Prothrombin Time: 12.8 seconds (ref 11.4–15.2)

## 2020-11-01 LAB — SARS CORONAVIRUS 2 (TAT 6-24 HRS): SARS Coronavirus 2: NEGATIVE

## 2020-11-01 LAB — APTT: aPTT: 26 seconds (ref 24–36)

## 2020-11-02 ENCOUNTER — Encounter: Payer: Self-pay | Admitting: Internal Medicine

## 2020-11-02 ENCOUNTER — Encounter: Payer: Self-pay | Admitting: Adult Health

## 2020-11-02 ENCOUNTER — Ambulatory Visit (INDEPENDENT_AMBULATORY_CARE_PROVIDER_SITE_OTHER): Payer: Medicare Other | Admitting: Adult Health

## 2020-11-02 DIAGNOSIS — Z01811 Encounter for preprocedural respiratory examination: Secondary | ICD-10-CM | POA: Insufficient documentation

## 2020-11-02 DIAGNOSIS — J449 Chronic obstructive pulmonary disease, unspecified: Secondary | ICD-10-CM

## 2020-11-02 DIAGNOSIS — J9611 Chronic respiratory failure with hypoxia: Secondary | ICD-10-CM | POA: Diagnosis not present

## 2020-11-02 DIAGNOSIS — I272 Pulmonary hypertension, unspecified: Secondary | ICD-10-CM

## 2020-11-02 DIAGNOSIS — G4733 Obstructive sleep apnea (adult) (pediatric): Secondary | ICD-10-CM

## 2020-11-02 NOTE — Progress Notes (Signed)
_0  ID: Susan Barajas, female    DOB: 1945/11/10, 75 y.o.   MRN: [id number]  Chief Complaint  Patient presents with   Follow-up    Referring provider: Albina Billet, MD  HPI: 75 year old female former smoker followed for severe obstructive sleep apnea on nocturnal BiPAP and chronic respiratory failure on oxygen, COPD .  She has underlying pulmonary hypertension secondary to underlying sleep apnea, diastolic heart failure and cor pulmonale.  She is followed by cardiology and is on Opsumit and tadalafil COVID-19 infection with COVID-pneumonia in September 2020 Previous DVT in 2018 was on anticoagulation but had to be stopped due to refractory anemia with IVC filter placed  TEST/EVENTS :  RHC/LHC was done in 3/21, showing mild CAD; mean RA 12, PA 68/25 mean 42, mean PCWP 9, CI 4.1, PVR 4.13 WU.  V/Q scan in 3/21 did not show evidence for chronic PE.     High resolution chest CT in 3/21 did not show evidence for ILD, it did show mild-moderate emphysema.     pulmonary function testing in March 2021 showed moderate airflow obstruction with an FEV1 at 66%, ratio 56, FVC 91%, positive bronchodilator response.  Significant mid flow obstruction and reversibility.  DLCO 32%.  PET scan 09/2020 -Slight decrease in size of a left upper quadrant mass which demonstrates similar mild hypermetabolism. 2. A hypermetabolic ileocolic mesenteric node is slightly increased in size since the prior exam, suspicious metastatic disease. This could alternatively be reactive, given the appearance of the colon. 3. Chronic sigmoid wall thickening and diffuse colonic hypermetabolism in the setting of marked diverticulosis. Likely muscular hypertrophy and chronic diverticulitis. Correlate with any symptoms to suggest acute superimposed inflammation. Also correlate with colon cancer screening history to exclude underlying neoplasm.  11/02/2020 Follow up : COPD , O2 RF , OSA and Pulmonary Hypertension   Patient presents for a follow-up visit.  Last seen September 2021.  Patient has underlying moderate COPD.  She was placed on Stiolto August 2021.  Patient says that this has really helped her feels that the inhaler has help with her breathing.  She denies any significant shortness of breath.  She tries to stay somewhat active.  She is able to do her light household chores cooking and cleaning.  She remains independent and lives at home.  Her brother does live there with her.  She denies any flare of her cough or wheezing.  Is had no recent antibiotic use or steroid use.  No increased albuterol use. Patient is able to drive but only drives short distances.  Has family close by.  She has 2 adult children.  And 4 grandchildren. She has no shortness of breath at rest.  Gets winded with prolonged walking.  She is on oxygen 4 L at home.  This is on continuous flow.  When she is out away from home or doing activities with prolonged walking.  She uses her portable simply go and uses 6 pulsing oxygen. She denies any recent increased oxygen demands.  O2 saturations have maintained in the 90s  Patient is on BiPAP at bedtime for underlying sleep apnea.  She says she cannot sleep without it.  She uses her BiPAP every single night.  Usually gets in about 8 hours.  BiPAP download shows excellent compliance with 100% usage.  Daily average usage at 7.5 hours.  Patient is on auto BiPAP IPAP at 18 cm H2O.  APAP at 13 cm H2O.  AHI is 1.2.  She is followed by  cardiology for pulmonary hypertension.  She remains on tadalafil and Opsumit.  Recent echo in May 2022 showed improved pulmonary artery pressures with PAP at 43 mmHg.  Patient says she does feel that her shortness of breath has improved since beginning these medications.  She denies any orthopnea or increased leg swelling  Patient has been diagnosed recently with a gastrointestinal stromal tumor.  She has been followed by oncology.  Is currently on Gleevec.  She has  upcoming abdominal surgery next week for tumor resection. She is here today for a pulmonary surgical risk assessment.  As above patient has compensated moderate COPD, and oxygen dependent respiratory failure on 4 L continuous and 6 L pulsing oxygen with no recent increased oxygen demands.  She remains independent at home and has had no recent flare of her respiratory symptoms.    No Known Allergies  Immunization History  Administered Date(s) Administered   Fluad Quad(high Dose 65+) 03/14/2019   Influenza Split 01/25/2018   Influenza-Unspecified 01/09/2020   Moderna Sars-Covid-2 Vaccination 05/27/2019, 06/27/2019   Pneumococcal Polysaccharide-23 04/13/2019    Past Medical History:  Diagnosis Date   Anemia    Asthma 1950   CHF (congestive heart failure) (HCC)    COPD (chronic obstructive pulmonary disease) (HCC)    Cor pulmonale (HCC)    DVT (deep venous thrombosis) ([place]) 2018   GERD (gastroesophageal reflux disease)    GIST (gastrointestinal stromal tumor) of small bowel, malignant (HCC)    Glaucoma 1949   Hyperlipidemia    Hypertension    Obesity, unspecified    OSA treated with BiPAP    Personal history of tobacco use, presenting hazards to health    Pneumonia    Presence of IVC filter    Pulmonary HTN (HCC)    Supplemental oxygen dependent    T2DM (type 2 diabetes mellitus) ([place])    Varicose veins of lower extremities with other complications     Tobacco History: Social History   Tobacco Use  Smoking Status Former   Packs/day: 1.50   Years: 20.00   Pack years: 30.00   Types: Cigarettes   Quit date: 1999   Years since quitting: 23.5  Smokeless Tobacco Never   Counseling given: Not Answered   Outpatient Medications Prior to Visit  Medication Sig Dispense Refill   ACCU-CHEK AVIVA PLUS test strip 1 each 2 (two) times daily.     Accu-Chek Softclix Lancets lancets USE 1 TO CHECK GLUCOSE TWICE DAILY     acetaminophen (TYLENOL) 325 MG tablet Take 2 tablets (650 mg  total) by mouth every 6 (six) hours as needed for mild pain (or Fever >/= 101).     aspirin 81 MG chewable tablet Chew 1 tablet (81 mg total) by mouth daily. 30 tablet 0   Calcium Carb-Cholecalciferol (CALCIUM/VITAMIN D PO) Take 3 tablets by mouth daily.     dapagliflozin propanediol (FARXIGA) 10 MG TABS tablet TAKE 1 TABLET BY MOUTH ONCE DAILY (Patient taking differently: Take 10 mg by mouth daily.) 90 tablet 3   ferrous gluconate (FERGON) 325 MG tablet Take 325 mg by mouth in the morning and at bedtime.      furosemide (LASIX) 20 MG tablet Take 20-40 mg by mouth See admin instructions. Take 40 mg by mouth in the morning and 20 mg in the evening     furosemide (LASIX) 40 MG tablet Take 1 tablet (40 mg total) by mouth in the morning AND 0.5 tablets (20 mg total) every evening. 45 tablet 3  HUMALOG KWIKPEN 100 UNIT/ML KiwkPen Inject 10 Units into the skin 2 (two) times daily.      imatinib (GLEEVEC) 100 MG tablet TAKE 4 TABLETS (400 MG TOTAL) BY MOUTH DAILY. TAKE WITH MEALS AND LARGE GLASS OF WATER (Patient taking differently: Take 400 mg by mouth daily.) 120 tablet 1   insulin glargine (LANTUS) 100 UNIT/ML injection Inject 32 Units into the skin in the morning.     latanoprost (XALATAN) 0.005 % ophthalmic solution Place 1 drop into both eyes at bedtime.      lovastatin (MEVACOR) 20 MG tablet Take 20 mg by mouth every evening.      metFORMIN (GLUCOPHAGE) 1000 MG tablet Take 1,000 mg by mouth daily.      Multiple Vitamin (MULTIVITAMIN) tablet Take 1 tablet by mouth daily.     omeprazole (PRILOSEC) 40 MG capsule Take 40 mg by mouth daily.     ondansetron (ZOFRAN) 8 MG tablet One pill every 8 hours as needed for nausea/vomitting. (Patient taking differently: Take 8 mg by mouth every 8 (eight) hours as needed for nausea or vomiting.) 45 tablet 1   OPSUMIT 10 MG tablet Take 1 tablet daily. (Patient taking differently: Take 10 mg by mouth daily.) 30 tablet 11   potassium chloride SA (KLOR-CON) 20 MEQ  tablet Take 1 tablet (20 mEq total) by mouth daily. 90 tablet 3   STIOLTO RESPIMAT 2.5-2.5 MCG/ACT AERS INHALE 2 PUFFS BY MOUTH ONCE DAILY (Patient taking differently: Inhale 2 puffs into the lungs daily.) 4 g 5   tadalafil, PAH, (ALYQ) 20 MG tablet Take 2 tablets (40 mg total) by mouth daily. 60 tablet 11   timolol (TIMOPTIC) 0.5 % ophthalmic solution Place 1 drop into both eyes 2 (two) times daily.      No facility-administered medications prior to visit.     Review of Systems:   Constitutional:   No  weight loss, night sweats,  Fevers, chills, fatigue, or  lassitude.  HEENT:   No headaches,  Difficulty swallowing,  Tooth/dental problems, or  Sore throat,                No sneezing, itching, ear ache, nasal congestion, post nasal drip,   CV:  No chest pain,  Orthopnea, PND, swelling in lower extremities, anasarca, dizziness, palpitations, syncope.   GI  No heartburn, indigestion, abdominal pain, nausea, vomiting, diarrhea, change in bowel habits, loss of appetite, bloody stools.   Resp: No shortness of breath with exertion or at rest.  No excess mucus, no productive cough,  No non-productive cough,  No coughing up of blood.  No change in color of mucus.  No wheezing.  No chest wall deformity  Skin: no rash or lesions.  GU: no dysuria, change in color of urine, no urgency or frequency.  No flank pain, no hematuria   MS:  No joint pain or swelling.  No decreased range of motion.  No back pain.    Physical Exam  BP 98/64 (BP Location: Left Arm, Patient Position: Sitting, Cuff Size: Normal)   Pulse 78   Temp 98.1 F (36.7 C) (Temporal)   Ht _0  (1.651 m)   Wt 197 lb 6.4 oz (89.5 kg)   SpO2 92%   BMI 32.85 kg/m   GEN: A/Ox3; pleasant , NAD, well nourished    HEENT:  [place]/AT,  EACs-clear, TMs-wnl, NOSE-clear, THROAT-clear, no lesions, no postnasal drip or exudate noted.   NECK:  Supple w/ fair ROM; no JVD; normal carotid impulses  w/o bruits; no thyromegaly or nodules  palpated; no lymphadenopathy.    RESP  Clear  P & A; w/o, wheezes/ rales/ or rhonchi. no accessory muscle use, no dullness to percussion  CARD:  RRR, no m/r/g, no peripheral edema, pulses intact, no cyanosis or clubbing.  GI:   Soft & nt; nml bowel sounds; no organomegaly or masses detected.   Musco: Warm bil, no deformities or joint swelling noted.   Neuro: alert, no focal deficits noted.    Skin: Warm, no lesions or rashes    Lab Results:  CBC    Component Value Date/Time   WBC 7.4 10/19/2020 1250   RBC 3.84 (L) 10/19/2020 1250   HGB 11.0 (L) 10/19/2020 1250   HCT 34.5 (L) 10/19/2020 1250   HCT 27.1 (L) 11/25/2016 2153   PLT 252 10/19/2020 1250   MCV 89.8 10/19/2020 1250   MCH 28.6 10/19/2020 1250   MCHC 31.9 10/19/2020 1250   RDW 17.6 (H) 10/19/2020 1250   LYMPHSABS 0.6 (L) 10/19/2020 1250   MONOABS 0.5 10/19/2020 1250   EOSABS 0.0 10/19/2020 1250   BASOSABS 0.0 10/19/2020 1250    BMET    Component Value Date/Time   NA 135 10/19/2020 1250   K 4.0 10/19/2020 1250   CL 104 10/19/2020 1250   CO2 25 10/19/2020 1250   GLUCOSE 82 10/19/2020 1250   BUN 21 10/19/2020 1250   CREATININE 0.74 10/19/2020 1250   CALCIUM 8.7 (L) 10/19/2020 1250   GFRNONAA >60 10/19/2020 1250   GFRAA >60 10/28/2019 1213    BNP    Component Value Date/Time   BNP 65.8 09/04/2020 1151    ProBNP    Component Value Date/Time   PROBNP 819.0 (H) 04/13/2019 1712    Imaging: No results found.  iron sucrose (VENOFER) injection 200 mg     Date Action Dose Route User   09/19/2020 1157 Given 200 mg Intravenous Josiah Lobo, RN      iron sucrose (VENOFER) injection 200 mg     Date Action Dose Route User   10/19/2020 1359 Given 200 mg Intravenous Johnsie Cancel, RN      0.9 %  sodium chloride infusion     Date Action Dose Route User   09/19/2020 1221 Rate/Dose Change (none) Intravenous Josiah Lobo, RN   09/19/2020 1200 Rate/Dose Change (none) Intravenous Josiah Lobo,  RN   09/19/2020 1159 New Bag/Given (none) Intravenous Josiah Lobo, RN      0.9 %  sodium chloride infusion     Date Action Dose Route User   10/19/2020 1422 Rate/Dose Change (none) Intravenous Johnsie Cancel, RN   10/19/2020 1400 Rate/Dose Change (none) Intravenous Johnsie Cancel, RN   10/19/2020 1351 New Bag/Given (none) Intravenous Johnsie Cancel, RN       PFT Results Latest Ref Rng & Units 07/13/2019  FVC-Pre L 2.04  FVC-Predicted Pre % 82  FVC-Post L 2.26  FVC-Predicted Post % 91  Pre FEV1/FVC % % 56  Post FEV1/FCV % % 56  FEV1-Pre L 1.14  FEV1-Predicted Pre % 59  FEV1-Post L 1.28  DLCO uncorrected ml/min/mmHg 6.67  DLCO [facility]C% % 32  DLVA Predicted % 49  TLC L 5.01  TLC % Predicted % 93  RV % Predicted % 127    No results found for: NITRICOXIDE      Assessment & Plan:   Preop pulmonary/respiratory exam Moderate COPD, chronic respiratory failure, pulmonary hypertension along with along with multiple comorbidities places her at a  moderate to high surgical risk however is not excluded from upcoming surgery-malignant gastrointestinal stromal tumor resection   We went over the following risks;   Major Pulmonary risks identified in the multifactorial risk analysis are but not limited to a) pneumonia; b) recurrent intubation risk; c) prolonged or recurrent acute respiratory failure needing mechanical ventilation; d) prolonged hospitalization; e) DVT/Pulmonary embolism; f) Acute Pulmonary edema  Recommend 1. Short duration of surgery as much as possible and avoid paralytic if possible 2. Recovery in step down or ICU with Pulmonary consultation if indicated.  3. DVT prophylaxis if indicated.  4. Aggressive pulmonary toilet with o2, bronchodilatation, and incentive spirometry and early ambulation 5. Take Stiolto day of surgery , albuerol inhaler /neb As needed   6. BIPAP At bedtime  And As needed        OSA (obstructive sleep apnea) Excellent control  and compliance on nocturnal BiPAP.  Continue on current settings   COPD (chronic obstructive pulmonary disease) ([place]) Under good control -continue on current regimen   Plan  . Patient Instructions  Continue on BIPAP At bedtime.  Continue on Oxygen 4l/m rest and 6l/m with activity .  Activity as tolerated  Continue Stiolto 2 puffs  Daily, rinse after use.  Best of luck with upcoming surgery . Follow up with Dr. Mortimer Fries 2 -3 months and As needed   Please contact office for sooner follow up if symptoms do not improve or worsen or seek emergency care       Chronic respiratory failure with hypoxia ([place]) Continue on oxygen 4 L continuous flow.  Use 6 L pulsing oxygen.  O2 saturation goals  greater than 88 to 90%  Plan  Patient Instructions  Continue on BIPAP At bedtime.  Continue on Oxygen 4l/m rest and 6l/m with activity .  Activity as tolerated  Continue Stiolto 2 puffs  Daily, rinse after use.  Best of luck with upcoming surgery . Follow up with Dr. Mortimer Fries 2 -3 months and As needed   Please contact office for sooner follow up if symptoms do not improve or worsen or seek emergency care       Pulmonary hypertension ([place]) Continue on current regimen.  Continue follow-up with cardiology   I spent   41   minutes dedicated to the care of this patient on the date of this encounter to include pre-visit review of records, face-to-face time with the patient discussing conditions above, post visit ordering of testing, clinical documentation with the electronic health record, making appropriate referrals as documented, and communicating necessary findings to members of the patients care team.    Rexene Edison, NP 11/02/2020

## 2020-11-02 NOTE — Assessment & Plan Note (Signed)
Under good control -continue on current regimen   Plan  . Patient Instructions  Continue on BIPAP At bedtime.  Continue on Oxygen 4l/m rest and 6l/m with activity .  Activity as tolerated  Continue Stiolto 2 puffs  Daily, rinse after use.  Best of luck with upcoming surgery . Follow up with Dr. Mortimer Fries 2 -3 months and As needed   Please contact office for sooner follow up if symptoms do not improve or worsen or seek emergency care

## 2020-11-02 NOTE — Assessment & Plan Note (Signed)
Excellent control and compliance on nocturnal BiPAP.  Continue on current settings

## 2020-11-02 NOTE — Assessment & Plan Note (Signed)
Continue on current regimen.  Continue follow-up with cardiology

## 2020-11-02 NOTE — Assessment & Plan Note (Signed)
Continue on oxygen 4 L continuous flow.  Use 6 L pulsing oxygen.  O2 saturation goals  greater than 88 to 90%  Plan  Patient Instructions  Continue on BIPAP At bedtime.  Continue on Oxygen 4l/m rest and 6l/m with activity .  Activity as tolerated  Continue Stiolto 2 puffs  Daily, rinse after use.  Best of luck with upcoming surgery . Follow up with Dr. Mortimer Fries 2 -3 months and As needed   Please contact office for sooner follow up if symptoms do not improve or worsen or seek emergency care

## 2020-11-02 NOTE — Assessment & Plan Note (Addendum)
Moderate COPD, chronic respiratory failure, pulmonary hypertension along with along with multiple comorbidities places her at a  moderate to high surgical risk however is not excluded from upcoming surgery-malignant gastrointestinal stromal tumor resection   We went over the following risks;   Major Pulmonary risks identified in the multifactorial risk analysis are but not limited to a) pneumonia; b) recurrent intubation risk; c) prolonged or recurrent acute respiratory failure needing mechanical ventilation; d) prolonged hospitalization; e) DVT/Pulmonary embolism; f) Acute Pulmonary edema  Recommend 1. Short duration of surgery as much as possible and avoid paralytic if possible 2. Recovery in step down or ICU with Pulmonary consultation if indicated.  3. DVT prophylaxis if indicated.  4. Aggressive pulmonary toilet with o2, bronchodilatation, and incentive spirometry and early ambulation 5. Take Stiolto day of surgery , albuerol inhaler /neb As needed   6. BIPAP At bedtime  And As needed

## 2020-11-02 NOTE — Patient Instructions (Signed)
Continue on BIPAP At bedtime.  Continue on Oxygen 4l/m rest and 6l/m with activity .  Activity as tolerated  Continue Stiolto 2 puffs  Daily, rinse after use.  Best of luck with upcoming surgery . Follow up with Dr. Mortimer Fries 2 -3 months and As needed   Please contact office for sooner follow up if symptoms do not improve or worsen or seek emergency care

## 2020-11-04 MED ORDER — CHLORHEXIDINE GLUCONATE CLOTH 2 % EX PADS: 6.0000 | MEDICATED_PAD | Freq: Once | CUTANEOUS | Status: DC

## 2020-11-04 MED ORDER — CHLORHEXIDINE GLUCONATE 0.12 % MT SOLN: 15.0000 mL | Freq: Once | OROMUCOSAL | Status: AC

## 2020-11-04 MED ORDER — CHLORHEXIDINE GLUCONATE CLOTH 2 % EX PADS
6.0000 | MEDICATED_PAD | Freq: Once | CUTANEOUS | Status: AC
  Administered 2020-11-05: 6 via TOPICAL

## 2020-11-04 MED ORDER — CEFAZOLIN SODIUM-DEXTROSE 2-4 GM/100ML-% IV SOLN
2.0000 g | INTRAVENOUS | Status: AC
  Administered 2020-11-05: 2 g via INTRAVENOUS

## 2020-11-04 MED ORDER — ALVIMOPAN 12 MG PO CAPS: 12.0000 mg | ORAL_CAPSULE | ORAL | Status: AC

## 2020-11-04 MED ORDER — LACTATED RINGERS IV SOLN: INTRAVENOUS | Status: DC

## 2020-11-04 MED ORDER — ORAL CARE MOUTH RINSE: 15.0000 mL | Freq: Once | OROMUCOSAL | Status: AC

## 2020-11-05 ENCOUNTER — Inpatient Hospital Stay: Payer: Medicare Other | Admitting: Urgent Care

## 2020-11-05 ENCOUNTER — Encounter: Payer: Self-pay | Admitting: General Surgery

## 2020-11-05 ENCOUNTER — Other Ambulatory Visit: Payer: Self-pay

## 2020-11-05 ENCOUNTER — Inpatient Hospital Stay: Payer: Medicare Other

## 2020-11-05 ENCOUNTER — Encounter
Admission: RE | Disposition: A | Discharge status: Home / Self Care | Payer: Self-pay | Source: Home / Self Care | Attending: General Surgery

## 2020-11-05 ENCOUNTER — Inpatient Hospital Stay
Admission: RE | Admit: 2020-11-05 | Discharge: 2020-11-10 | DRG: 330 | Disposition: A | Discharge status: Home / Self Care | Payer: Medicare Other | Attending: General Surgery | Admitting: General Surgery

## 2020-11-05 DIAGNOSIS — K429 Umbilical hernia without obstruction or gangrene: Secondary | ICD-10-CM | POA: Diagnosis present

## 2020-11-05 DIAGNOSIS — E876 Hypokalemia: Secondary | ICD-10-CM | POA: Diagnosis not present

## 2020-11-05 DIAGNOSIS — K219 Gastro-esophageal reflux disease without esophagitis: Secondary | ICD-10-CM | POA: Diagnosis present

## 2020-11-05 DIAGNOSIS — Z794 Long term (current) use of insulin: Secondary | ICD-10-CM

## 2020-11-05 DIAGNOSIS — Z7984 Long term (current) use of oral hypoglycemic drugs: Secondary | ICD-10-CM

## 2020-11-05 DIAGNOSIS — Z6832 Body mass index (BMI) 32.0-32.9, adult: Secondary | ICD-10-CM

## 2020-11-05 DIAGNOSIS — R110 Nausea: Secondary | ICD-10-CM | POA: Diagnosis not present

## 2020-11-05 DIAGNOSIS — Z20822 Contact with and (suspected) exposure to covid-19: Secondary | ICD-10-CM | POA: Diagnosis present

## 2020-11-05 DIAGNOSIS — E785 Hyperlipidemia, unspecified: Secondary | ICD-10-CM | POA: Diagnosis present

## 2020-11-05 DIAGNOSIS — E669 Obesity, unspecified: Secondary | ICD-10-CM | POA: Diagnosis present

## 2020-11-05 DIAGNOSIS — Z79899 Other long term (current) drug therapy: Secondary | ICD-10-CM

## 2020-11-05 DIAGNOSIS — I2729 Other secondary pulmonary hypertension: Secondary | ICD-10-CM | POA: Diagnosis present

## 2020-11-05 DIAGNOSIS — K660 Peritoneal adhesions (postprocedural) (postinfection): Secondary | ICD-10-CM | POA: Diagnosis present

## 2020-11-05 DIAGNOSIS — F32A Depression, unspecified: Secondary | ICD-10-CM | POA: Diagnosis present

## 2020-11-05 DIAGNOSIS — G4733 Obstructive sleep apnea (adult) (pediatric): Secondary | ICD-10-CM | POA: Diagnosis present

## 2020-11-05 DIAGNOSIS — Z419 Encounter for procedure for purposes other than remedying health state, unspecified: Secondary | ICD-10-CM

## 2020-11-05 DIAGNOSIS — I5032 Chronic diastolic (congestive) heart failure: Secondary | ICD-10-CM | POA: Diagnosis present

## 2020-11-05 DIAGNOSIS — Z808 Family history of malignant neoplasm of other organs or systems: Secondary | ICD-10-CM

## 2020-11-05 DIAGNOSIS — Z8042 Family history of malignant neoplasm of prostate: Secondary | ICD-10-CM

## 2020-11-05 DIAGNOSIS — D649 Anemia, unspecified: Secondary | ICD-10-CM | POA: Diagnosis present

## 2020-11-05 DIAGNOSIS — I272 Pulmonary hypertension, unspecified: Secondary | ICD-10-CM | POA: Diagnosis present

## 2020-11-05 DIAGNOSIS — Z9981 Dependence on supplemental oxygen: Secondary | ICD-10-CM | POA: Diagnosis not present

## 2020-11-05 DIAGNOSIS — E1169 Type 2 diabetes mellitus with other specified complication: Secondary | ICD-10-CM | POA: Diagnosis not present

## 2020-11-05 DIAGNOSIS — J449 Chronic obstructive pulmonary disease, unspecified: Secondary | ICD-10-CM | POA: Diagnosis present

## 2020-11-05 DIAGNOSIS — Z8249 Family history of ischemic heart disease and other diseases of the circulatory system: Secondary | ICD-10-CM

## 2020-11-05 DIAGNOSIS — J9611 Chronic respiratory failure with hypoxia: Secondary | ICD-10-CM | POA: Diagnosis present

## 2020-11-05 DIAGNOSIS — I9581 Postprocedural hypotension: Secondary | ICD-10-CM | POA: Diagnosis not present

## 2020-11-05 DIAGNOSIS — Z86718 Personal history of other venous thrombosis and embolism: Secondary | ICD-10-CM

## 2020-11-05 DIAGNOSIS — E1151 Type 2 diabetes mellitus with diabetic peripheral angiopathy without gangrene: Secondary | ICD-10-CM | POA: Diagnosis present

## 2020-11-05 DIAGNOSIS — I110 Hypertensive heart disease with heart failure: Secondary | ICD-10-CM | POA: Diagnosis present

## 2020-11-05 DIAGNOSIS — Z87891 Personal history of nicotine dependence: Secondary | ICD-10-CM | POA: Diagnosis not present

## 2020-11-05 DIAGNOSIS — C49A3 Gastrointestinal stromal tumor of small intestine: Secondary | ICD-10-CM | POA: Diagnosis present

## 2020-11-05 DIAGNOSIS — E119 Type 2 diabetes mellitus without complications: Secondary | ICD-10-CM

## 2020-11-05 DIAGNOSIS — Z7982 Long term (current) use of aspirin: Secondary | ICD-10-CM | POA: Diagnosis not present

## 2020-11-05 HISTORY — DX: Cor pulmonale (chronic): I27.81

## 2020-11-05 HISTORY — DX: Dependence on supplemental oxygen: Z99.81

## 2020-11-05 HISTORY — DX: Obstructive sleep apnea (adult) (pediatric): G47.33

## 2020-11-05 HISTORY — DX: Type 2 diabetes mellitus without complications: E11.9

## 2020-11-05 HISTORY — PX: PARTIAL COLECTOMY: SHX5273

## 2020-11-05 HISTORY — DX: Presence of other vascular implants and grafts: Z95.828

## 2020-11-05 HISTORY — DX: Pulmonary hypertension, unspecified: I27.20

## 2020-11-05 LAB — CBC WITH DIFFERENTIAL/PLATELET
Abs Immature Granulocytes: 0.04 10*3/uL (ref 0.00–0.07)
Basophils Absolute: 0 10*3/uL (ref 0.0–0.1)
Basophils Relative: 0 %
Eosinophils Absolute: 0 10*3/uL (ref 0.0–0.5)
Eosinophils Relative: 0 %
HCT: 34.7 % — ABNORMAL LOW (ref 36.0–46.0)
Hemoglobin: 11.2 g/dL — ABNORMAL LOW (ref 12.0–15.0)
Immature Granulocytes: 0 %
Lymphocytes Relative: 3 %
Lymphs Abs: 0.3 10*3/uL — ABNORMAL LOW (ref 0.7–4.0)
MCH: 29.7 pg (ref 26.0–34.0)
MCHC: 32.3 g/dL (ref 30.0–36.0)
MCV: 92 fL (ref 80.0–100.0)
Monocytes Absolute: 0.4 10*3/uL (ref 0.1–1.0)
Monocytes Relative: 4 %
Neutro Abs: 8.8 10*3/uL — ABNORMAL HIGH (ref 1.7–7.7)
Neutrophils Relative %: 93 %
Platelets: 238 10*3/uL (ref 150–400)
RBC: 3.77 MIL/uL — ABNORMAL LOW (ref 3.87–5.11)
RDW: 16.4 % — ABNORMAL HIGH (ref 11.5–15.5)
WBC: 9.6 10*3/uL (ref 4.0–10.5)
nRBC: 0 % (ref 0.0–0.2)

## 2020-11-05 LAB — GLUCOSE, CAPILLARY
Glucose-Capillary: 88 mg/dL (ref 70–99)
Glucose-Capillary: 153 mg/dL — ABNORMAL HIGH (ref 70–99)
Glucose-Capillary: 205 mg/dL — ABNORMAL HIGH (ref 70–99)
Glucose-Capillary: 242 mg/dL — ABNORMAL HIGH (ref 70–99)

## 2020-11-05 SURGERY — COLECTOMY, PARTIAL
Anesthesia: General

## 2020-11-05 MED ORDER — ROCURONIUM BROMIDE 10 MG/ML (PF) SYRINGE
PREFILLED_SYRINGE | INTRAVENOUS | Status: AC
  Filled 2020-11-05: qty 10

## 2020-11-05 MED ORDER — ALVIMOPAN 12 MG PO CAPS
ORAL_CAPSULE | ORAL | Status: AC
  Administered 2020-11-05: 12 mg via ORAL
  Filled 2020-11-05: qty 1

## 2020-11-05 MED ORDER — PANTOPRAZOLE SODIUM 40 MG IV SOLR
40.0000 mg | Freq: Every day | INTRAVENOUS | Status: DC
  Administered 2020-11-05 – 2020-11-09 (×5): 40 mg via INTRAVENOUS
  Filled 2020-11-05 (×5): qty 40

## 2020-11-05 MED ORDER — BUPIVACAINE LIPOSOME 1.3 % IJ SUSP
INTRAMUSCULAR | Status: AC
  Filled 2020-11-05: qty 20

## 2020-11-05 MED ORDER — SUGAMMADEX SODIUM 500 MG/5ML IV SOLN
INTRAVENOUS | Status: AC
  Filled 2020-11-05: qty 5

## 2020-11-05 MED ORDER — ONDANSETRON HCL 4 MG/2ML IJ SOLN
INTRAMUSCULAR | Status: AC
  Filled 2020-11-05: qty 2

## 2020-11-05 MED ORDER — BUPIVACAINE LIPOSOME 1.3 % IJ SUSP: INTRAMUSCULAR | Status: DC | PRN

## 2020-11-05 MED ORDER — EPHEDRINE SULFATE 50 MG/ML IJ SOLN
INTRAMUSCULAR | Status: DC | PRN
  Administered 2020-11-05: 10 mg via INTRAVENOUS
  Administered 2020-11-05: 5 mg via INTRAVENOUS
  Administered 2020-11-05 (×4): 10 mg via INTRAVENOUS
  Administered 2020-11-05: 5 mg via INTRAVENOUS
  Administered 2020-11-05 (×2): 10 mg via INTRAVENOUS
  Administered 2020-11-05: 5 mg via INTRAVENOUS
  Administered 2020-11-05 (×4): 10 mg via INTRAVENOUS

## 2020-11-05 MED ORDER — ONDANSETRON HCL 4 MG/2ML IJ SOLN
4.0000 mg | Freq: Four times a day (QID) | INTRAMUSCULAR | Status: DC | PRN
  Administered 2020-11-05: 4 mg via INTRAVENOUS
  Filled 2020-11-05: qty 2

## 2020-11-05 MED ORDER — ACETAMINOPHEN 10 MG/ML IV SOLN
INTRAVENOUS | Status: DC | PRN
  Administered 2020-11-05: 1000 mg via INTRAVENOUS

## 2020-11-05 MED ORDER — ONDANSETRON 4 MG PO TBDP: 4.0000 mg | ORAL_TABLET | Freq: Four times a day (QID) | ORAL | Status: DC | PRN

## 2020-11-05 MED ORDER — CHLORHEXIDINE GLUCONATE 0.12 % MT SOLN
OROMUCOSAL | Status: AC
  Administered 2020-11-05: 15 mL via OROMUCOSAL
  Filled 2020-11-05: qty 15

## 2020-11-05 MED ORDER — TIMOLOL MALEATE 0.5 % OP SOLN
1.0000 [drp] | Freq: Two times a day (BID) | OPHTHALMIC | Status: DC
  Administered 2020-11-06 – 2020-11-10 (×9): 1 [drp] via OPHTHALMIC
  Filled 2020-11-05: qty 5

## 2020-11-05 MED ORDER — DEXAMETHASONE SODIUM PHOSPHATE 10 MG/ML IJ SOLN
INTRAMUSCULAR | Status: DC | PRN
  Administered 2020-11-05: 5 mg via INTRAVENOUS

## 2020-11-05 MED ORDER — ALVIMOPAN 12 MG PO CAPS
12.0000 mg | ORAL_CAPSULE | Freq: Two times a day (BID) | ORAL | Status: DC
  Filled 2020-11-05 (×3): qty 1

## 2020-11-05 MED ORDER — KETOROLAC TROMETHAMINE 15 MG/ML IJ SOLN: 7.5000 mg | Freq: Four times a day (QID) | INTRAMUSCULAR | Status: DC | PRN

## 2020-11-05 MED ORDER — LIDOCAINE HCL (CARDIAC) PF 100 MG/5ML IV SOSY
PREFILLED_SYRINGE | INTRAVENOUS | Status: DC | PRN
  Administered 2020-11-05: 100 mg via INTRAVENOUS

## 2020-11-05 MED ORDER — 0.9 % SODIUM CHLORIDE (POUR BTL) OPTIME
TOPICAL | Status: DC | PRN
  Administered 2020-11-05 (×2): 500 mL

## 2020-11-05 MED ORDER — SUGAMMADEX SODIUM 200 MG/2ML IV SOLN
INTRAVENOUS | Status: DC | PRN
Start: 2020-11-05 — End: 2020-11-05
  Administered 2020-11-05: 354 mg via INTRAVENOUS

## 2020-11-05 MED ORDER — FENTANYL CITRATE (PF) 100 MCG/2ML IJ SOLN
INTRAMUSCULAR | Status: AC
  Filled 2020-11-05: qty 2

## 2020-11-05 MED ORDER — MORPHINE SULFATE (PF) 2 MG/ML IV SOLN
2.0000 mg | INTRAVENOUS | Status: DC | PRN
Start: 2020-11-05 — End: 2020-11-10

## 2020-11-05 MED ORDER — SODIUM CHLORIDE 0.9 % IV SOLN: INTRAVENOUS | Status: DC

## 2020-11-05 MED ORDER — EPHEDRINE 5 MG/ML INJ
INTRAVENOUS | Status: AC
  Filled 2020-11-05: qty 20

## 2020-11-05 MED ORDER — BUPIVACAINE HCL (PF) 0.5 % IJ SOLN: INTRAMUSCULAR | Status: DC | PRN

## 2020-11-05 MED ORDER — ENOXAPARIN SODIUM 40 MG/0.4ML IJ SOSY
40.0000 mg | PREFILLED_SYRINGE | INTRAMUSCULAR | Status: DC
  Administered 2020-11-06 – 2020-11-09 (×4): 40 mg via SUBCUTANEOUS
  Filled 2020-11-05 (×5): qty 0.4

## 2020-11-05 MED ORDER — PROPOFOL 10 MG/ML IV BOLUS
INTRAVENOUS | Status: DC | PRN
  Administered 2020-11-05: 120 mg via INTRAVENOUS

## 2020-11-05 MED ORDER — ACETAMINOPHEN 10 MG/ML IV SOLN
INTRAVENOUS | Status: AC
  Filled 2020-11-05: qty 100

## 2020-11-05 MED ORDER — BUPIVACAINE LIPOSOME 1.3 % IJ SUSP
INTRAMUSCULAR | Status: DC | PRN
  Administered 2020-11-05: 20 mL

## 2020-11-05 MED ORDER — BUPIVACAINE HCL (PF) 0.25 % IJ SOLN
INTRAMUSCULAR | Status: DC | PRN
  Administered 2020-11-05: 40 mL

## 2020-11-05 MED ORDER — FENTANYL CITRATE (PF) 100 MCG/2ML IJ SOLN
INTRAMUSCULAR | Status: DC | PRN
  Administered 2020-11-05: 25 ug via INTRAVENOUS
  Administered 2020-11-05: 50 ug via INTRAVENOUS
  Administered 2020-11-05: 25 ug via INTRAVENOUS

## 2020-11-05 MED ORDER — ACETAMINOPHEN 650 MG RE SUPP: 650.0000 mg | RECTAL | Status: DC | PRN

## 2020-11-05 MED ORDER — EPHEDRINE 5 MG/ML INJ
INTRAVENOUS | Status: AC
  Filled 2020-11-05: qty 10

## 2020-11-05 MED ORDER — INSULIN ASPART 100 UNIT/ML IJ SOLN
0.0000 [IU] | INTRAMUSCULAR | Status: DC
  Administered 2020-11-05 – 2020-11-06 (×3): 5 [IU] via SUBCUTANEOUS
  Administered 2020-11-06: 2 [IU] via SUBCUTANEOUS
  Administered 2020-11-06: 3 [IU] via SUBCUTANEOUS
  Administered 2020-11-06 (×2): 5 [IU] via SUBCUTANEOUS
  Administered 2020-11-07 – 2020-11-08 (×7): 2 [IU] via SUBCUTANEOUS
  Administered 2020-11-08 – 2020-11-09 (×3): 5 [IU] via SUBCUTANEOUS
  Administered 2020-11-09 (×3): 3 [IU] via SUBCUTANEOUS
  Administered 2020-11-10: 2 [IU] via SUBCUTANEOUS
  Administered 2020-11-10: 3 [IU] via SUBCUTANEOUS
  Filled 2020-11-05 (×23): qty 1

## 2020-11-05 MED ORDER — SUCCINYLCHOLINE CHLORIDE 200 MG/10ML IV SOSY
PREFILLED_SYRINGE | INTRAVENOUS | Status: AC
  Filled 2020-11-05: qty 10

## 2020-11-05 MED ORDER — SEVOFLURANE IN SOLN
RESPIRATORY_TRACT | Status: AC
  Filled 2020-11-05: qty 250

## 2020-11-05 MED ORDER — PHENYLEPHRINE HCL (PRESSORS) 10 MG/ML IV SOLN
INTRAVENOUS | Status: AC
  Filled 2020-11-05: qty 1

## 2020-11-05 MED ORDER — LIDOCAINE HCL (PF) 2 % IJ SOLN
INTRAMUSCULAR | Status: AC
  Filled 2020-11-05: qty 5

## 2020-11-05 MED ORDER — ROCURONIUM BROMIDE 100 MG/10ML IV SOLN
INTRAVENOUS | Status: DC | PRN
  Administered 2020-11-05: 50 mg via INTRAVENOUS
  Administered 2020-11-05: 10 mg via INTRAVENOUS
  Administered 2020-11-05: 50 mg via INTRAVENOUS
  Administered 2020-11-05: 30 mg via INTRAVENOUS
  Administered 2020-11-05: 10 mg via INTRAVENOUS

## 2020-11-05 MED ORDER — DEXTROSE-NACL 5-0.9 % IV SOLN: INTRAVENOUS | Status: DC

## 2020-11-05 MED ORDER — PHENYLEPHRINE HCL (PRESSORS) 10 MG/ML IV SOLN
INTRAVENOUS | Status: DC | PRN
Start: 2020-11-05 — End: 2020-11-05
  Administered 2020-11-05 (×2): 100 ug via INTRAVENOUS
  Administered 2020-11-05: 50 ug via INTRAVENOUS
  Administered 2020-11-05 (×5): 100 ug via INTRAVENOUS

## 2020-11-05 MED ORDER — PROPOFOL 10 MG/ML IV BOLUS
INTRAVENOUS | Status: AC
  Filled 2020-11-05: qty 20

## 2020-11-05 MED ORDER — DEXAMETHASONE SODIUM PHOSPHATE 10 MG/ML IJ SOLN
INTRAMUSCULAR | Status: AC
  Filled 2020-11-05: qty 1

## 2020-11-05 MED ORDER — CEFAZOLIN SODIUM-DEXTROSE 2-4 GM/100ML-% IV SOLN
INTRAVENOUS | Status: AC
  Filled 2020-11-05: qty 100

## 2020-11-05 MED ORDER — LATANOPROST 0.005 % OP SOLN
1.0000 [drp] | Freq: Every day | OPHTHALMIC | Status: DC
  Administered 2020-11-06 – 2020-11-09 (×4): 1 [drp] via OPHTHALMIC
  Filled 2020-11-05: qty 2.5

## 2020-11-05 MED ORDER — ONDANSETRON HCL 4 MG/2ML IJ SOLN
INTRAMUSCULAR | Status: DC | PRN
  Administered 2020-11-05: 4 mg via INTRAVENOUS

## 2020-11-05 MED ORDER — ARFORMOTEROL TARTRATE 15 MCG/2ML IN NEBU
15.0000 ug | INHALATION_SOLUTION | Freq: Two times a day (BID) | RESPIRATORY_TRACT | Status: DC
  Administered 2020-11-06: 15 ug via RESPIRATORY_TRACT
  Filled 2020-11-05 (×3): qty 2

## 2020-11-05 MED ORDER — UMECLIDINIUM BROMIDE 62.5 MCG/INH IN AEPB
1.0000 | INHALATION_SPRAY | Freq: Every day | RESPIRATORY_TRACT | Status: DC
Start: 2020-11-05 — End: 2020-11-10
  Administered 2020-11-05 – 2020-11-10 (×6): 1 via RESPIRATORY_TRACT
  Filled 2020-11-05: qty 7

## 2020-11-05 MED ORDER — LACTATED RINGERS IV SOLN: INTRAVENOUS | Status: DC

## 2020-11-05 SURGICAL SUPPLY — 54 items
BAG COUNTER SPONGE EZ (MISCELLANEOUS) ×2 IMPLANT
CANISTER SUCT 1200ML W/VALVE (MISCELLANEOUS) ×2 IMPLANT
CHLORAPREP W/TINT 26 (MISCELLANEOUS) ×2 IMPLANT
COUNTER NEEDLE 20/40 LG (NEEDLE) ×2 IMPLANT
COVER CLAMP SIL LG PBX B (MISCELLANEOUS) ×2 IMPLANT
DRAPE LAPAROTOMY 100X77 ABD (DRAPES) ×2 IMPLANT
DRSG OPSITE POSTOP 4X10 (GAUZE/BANDAGES/DRESSINGS) ×2 IMPLANT
DRSG OPSITE POSTOP 4X8 (GAUZE/BANDAGES/DRESSINGS) ×2 IMPLANT
ELECT BLADE 6.5 EXT (BLADE) ×2 IMPLANT
ELECT REM PT RETURN 9FT ADLT (ELECTROSURGICAL) ×2
ELECTRODE REM PT RTRN 9FT ADLT (ELECTROSURGICAL) ×1 IMPLANT
GAUZE 4X4 16PLY ~~LOC~~+RFID DBL (SPONGE) ×2 IMPLANT
GLOVE SURG ENC MOIS LTX SZ7 (GLOVE) ×2 IMPLANT
GLOVE SURG ENC MOIS LTX SZ7.5 (GLOVE) ×2 IMPLANT
GLOVE SURG UNDER LTX SZ8 (GLOVE) ×2 IMPLANT
GOWN STRL REUS W/ TWL LRG LVL3 (GOWN DISPOSABLE) ×1 IMPLANT
GOWN STRL REUS W/TWL LRG LVL3 (GOWN DISPOSABLE) ×1
KIT TURNOVER KIT A (KITS) ×2 IMPLANT
LABEL OR SOLS (LABEL) ×2 IMPLANT
LIGASURE IMPACT 36 18CM CVD LR (INSTRUMENTS) IMPLANT
MANIFOLD NEPTUNE II (INSTRUMENTS) ×2 IMPLANT
NS IRRIG 1000ML POUR BTL (IV SOLUTION) IMPLANT
NS IRRIG 500ML POUR BTL (IV SOLUTION) ×2 IMPLANT
PACK BASIN MAJOR ARMC (MISCELLANEOUS) ×2 IMPLANT
PACK COLON CLEAN CLOSURE (MISCELLANEOUS) IMPLANT
RELOAD PROXIMATE 75MM BLUE (ENDOMECHANICALS) IMPLANT
RETRACTOR WND ALEXIS-O 25 LRG (MISCELLANEOUS) ×1 IMPLANT
RETRACTOR WOUND ALXS 18CM MED (MISCELLANEOUS) IMPLANT
RTRCTR WOUND ALEXIS O 18CM MED (MISCELLANEOUS)
RTRCTR WOUND ALEXIS O 25CM LRG (MISCELLANEOUS) ×2
SET YANKAUER POOLE SUCT (MISCELLANEOUS) ×2 IMPLANT
SHEARS HARMONIC 9CM CVD (BLADE) IMPLANT
SPONGE KITTNER 5P (MISCELLANEOUS) ×2 IMPLANT
SPONGE T-LAP 18X18 ~~LOC~~+RFID (SPONGE) ×8 IMPLANT
STAPLER PROXIMATE 75MM BLUE (STAPLE) IMPLANT
STAPLER SKIN PROX 35W (STAPLE) ×2 IMPLANT
SUT MAXON ABS #0 GS21 30IN (SUTURE) ×6 IMPLANT
SUT PROLENE 0 CT 1 30 (SUTURE) ×2 IMPLANT
SUT SILK 2 0 (SUTURE) ×1
SUT SILK 2-0 18XBRD TIE 12 (SUTURE) ×1 IMPLANT
SUT SILK 3 0 (SUTURE) ×1
SUT SILK 3 0 SH 30 (SUTURE) ×2 IMPLANT
SUT SILK 3-0 (SUTURE) ×14 IMPLANT
SUT SILK 3-0 18XBRD TIE 12 (SUTURE) ×1 IMPLANT
SUT VIC AB 0 CT1 36 (SUTURE) ×2 IMPLANT
SUT VIC AB 2-0 BRD 54 (SUTURE) ×2 IMPLANT
SUT VIC AB 2-0 CT1 27 (SUTURE) ×1
SUT VIC AB 2-0 CT1 TAPERPNT 27 (SUTURE) ×1 IMPLANT
SUT VIC AB 3-0 54X BRD REEL (SUTURE) ×1 IMPLANT
SUT VIC AB 3-0 BRD 54 (SUTURE) ×1
SUT VIC AB 3-0 SH 27 (SUTURE) ×3
SUT VIC AB 3-0 SH 27X BRD (SUTURE) ×3 IMPLANT
SYR BULB IRRIG 60ML STRL (SYRINGE) ×2 IMPLANT
TRAY FOLEY MTR SLVR 16FR STAT (SET/KITS/TRAYS/PACK) IMPLANT

## 2020-11-05 NOTE — Op Note (Signed)
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  Postoperative diagnosis: Same with umbilical hernia.  Operative procedure: Resection of proximal s changes jejunal GIST tumor with selective mesenteric dissection, repair of umbilical hernia.  Operating surgeon: Hervey Ard, MD.  Assistant: Elsie Stain, RNFA.  Anesthesia: General endotracheal, TAPP block  Estimated blood loss: 1000 cc.  Fluid replacement: 1500 cc crystalloid.  Clinical note: This 75 year old woman was identified with a 8+ centimeter GIST tumor of the proximal small bowel.  She underwent 18-monthsuppressive therapy with Gleevec with significant decrease in activity based on pet imaging and a modest decrease in size.  She had reached maximum benefit and was brought to the operating at this time for planned excision.  She is known by imaging to have at least 1 large mesenteric node.  Operative note: The patient underwent general endotracheal anesthesia and tolerated this with mild hypotension.  (She had undergone formal bowel prep in the event colon resection was required)  The abdomen was cleansed with ChloraPrep and draped.  A upper midline incision was made carried down through skin subtendinous tissue with hemostasis achieved by electrocautery.  The fascia was opened with cutting current.  She was identified with a small umbilical hernia likely from her previous laparoscopic tubal ligation.  This was encompassed in the main incision.  The small bowel was eviscerated and she was found to have a tumor mass very proximal small bowel just below the ligament of Treitz  Making use of the LigaSure device the filmy adhesions to the mass were divided and then it was resected.  Hemostasis was with the ligature device as well as 3-0 silk figure-of-eight sutures for some venous bleeders.  As the tumor was being resected it was found to have been originating from the very proximal portion of the jejunum on the  anterior wall.  This was encompassed with the tumor mass and sent in formalin.  The sizable node in the midportion of the mesentery was resected with the harmonic scalpel and hemostasis was good.  The mesenteric defect was closed with 3-0 running Vicryl suture.  2 smaller nodes near the tumor mass were identified and resected with the harmonic scalpel without incident.  Once the tumor mass was removed examination showed the location high in the jejunum.  The area was mobilized as much as possible so that the proximal bowel could be clearly transected.  A very short segment of small bowel was removed.  Once the 2 small bowel sections had been cleared interrupted 3-0 silks sutures were placed.  In the corners.  There was such a good vascular supply from the mucosa a 3-0 running locking sutures were used circumferentially for hemostasis.  A 3-0 silk seromuscular suture was then used anterior and posteriorly to complete the anastomosis.  Succus was milked proximally and there was no evidence of leakage.  The area was irrigated with saline solution.  The NG tube was ascertained to be in the body of the stomach.  Surgeons gloves were changed.  The fascia was closed in a single layer with 0 Surgilon figure-of-eight sutures.  The adipose layer was closed with a running 2-0 Vicryl suture.  Skin was closed with staples.  A honeycomb dressing was applied.  TAPP block was completed by anesthesia and the patient taken to recovery in stable condition.

## 2020-11-05 NOTE — H&P (Signed)
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 S/P 6 mo Gleevec treatment with maximum response based on last two PET/ CT scans. For resection.   Medications Prior to Admission  Medication Sig Dispense Refill Last Dose   acetaminophen (TYLENOL) 325 MG tablet Take 2 tablets (650 mg total) by mouth every 6 (six) hours as needed for mild pain (or Fever >/= 101).   Past Week   aspirin 81 MG chewable tablet Chew 1 tablet (81 mg total) by mouth daily. 30 tablet 0 Past Week   Calcium Carb-Cholecalciferol (CALCIUM/VITAMIN D PO) Take 3 tablets by mouth daily.   Past Week   dapagliflozin propanediol (FARXIGA) 10 MG TABS tablet TAKE 1 TABLET BY MOUTH ONCE DAILY (Patient taking differently: Take 10 mg by mouth daily.) 90 tablet 3 Past Week   ferrous gluconate (FERGON) 325 MG tablet Take 325 mg by mouth in the morning and at bedtime.    11/04/2020   furosemide (LASIX) 20 MG tablet Take 20-40 mg by mouth See admin instructions. Take 40 mg by mouth in the morning and 20 mg in the evening   11/04/2020   HUMALOG KWIKPEN 100 UNIT/ML KiwkPen Inject 10 Units into the skin 2 (two) times daily.    11/04/2020   imatinib (GLEEVEC) 100 MG tablet TAKE 4 TABLETS (400 MG TOTAL) BY MOUTH DAILY. TAKE WITH MEALS AND LARGE GLASS OF WATER (Patient taking differently: Take 400 mg by mouth daily.) 120 tablet 1 Past Week   insulin glargine (LANTUS) 100 UNIT/ML injection Inject 32 Units into the skin in the morning.   11/04/2020   latanoprost (XALATAN) 0.005 % ophthalmic solution Place 1 drop into both eyes at bedtime.    Past Week   lovastatin (MEVACOR) 20 MG tablet Take 20 mg by mouth every evening.    Past Week   metFORMIN (GLUCOPHAGE) 1000 MG tablet Take 1,000 mg by mouth daily.    11/01/2020   Multiple Vitamin (MULTIVITAMIN) tablet Take 1 tablet by mouth daily.   Past Week   omeprazole (PRILOSEC) 40 MG capsule Take 40 mg by mouth daily.   11/05/2020   ondansetron (ZOFRAN) 8 MG tablet One  pill every 8 hours as needed for nausea/vomitting. (Patient taking differently: Take 8 mg by mouth every 8 (eight) hours as needed for nausea or vomiting.) 45 tablet 1 Past Week   OPSUMIT 10 MG tablet Take 1 tablet daily. (Patient taking differently: Take 10 mg by mouth daily.) 30 tablet 11 11/05/2020 at 0600   potassium chloride SA (KLOR-CON) 20 MEQ tablet Take 1 tablet (20 mEq total) by mouth daily. 90 tablet 3 Past Week   STIOLTO RESPIMAT 2.5-2.5 MCG/ACT AERS INHALE 2 PUFFS BY MOUTH ONCE DAILY (Patient taking differently: Inhale 2 puffs into the lungs daily.) 4 g 5 11/05/2020 at 0600   tadalafil, PAH, (ALYQ) 20 MG tablet Take 2 tablets (40 mg total) by mouth daily. 60 tablet 11 11/05/2020 at 0600   timolol (TIMOPTIC) 0.5 % ophthalmic solution Place 1 drop into both eyes 2 (two) times daily.    Past Week   ACCU-CHEK AVIVA PLUS test strip 1 each 2 (two) times daily.      Accu-Chek Softclix Lancets lancets USE 1 TO CHECK GLUCOSE TWICE DAILY      furosemide (LASIX) 40 MG tablet Take 1 tablet (40 mg total) by mouth in the morning AND 0.5 tablets (20 mg total) every evening. 45 tablet 3 Not Taking  No Known Allergies Past Medical History:  Diagnosis Date   Anemia    Asthma 1950   CHF (congestive heart failure) (HCC)    COPD (chronic obstructive pulmonary disease) (HCC)    Cor pulmonale (HCC)    DVT (deep venous thrombosis) ([place]) 2018   GERD (gastroesophageal reflux disease)    GIST (gastrointestinal stromal tumor) of small bowel, malignant (HCC)    Glaucoma 1949   Hyperlipidemia    Hypertension    Obesity, unspecified    OSA treated with BiPAP    Personal history of tobacco use, presenting hazards to health    Pneumonia    Presence of IVC filter    Pulmonary HTN (HCC)    Supplemental oxygen dependent    T2DM (type 2 diabetes mellitus) ([place])    Varicose veins of lower extremities with other complications    Past Surgical History:  Procedure Laterality Date   BREAST BIOPSY Right 1991    BREAST BIOPSY Left 2013   COLONOSCOPY  2010   Dr. Vira Agar   COLONOSCOPY  Jan 2016   Dr Vira Agar   ESOPHAGOGASTRODUODENOSCOPY N/A 11/26/2016   Procedure: ESOPHAGOGASTRODUODENOSCOPY (EGD);  Surgeon: Lin Landsman, MD;  Location: [facility] ENDOSCOPY;  Service: Gastroenterology;  Laterality: N/A;   GIVENS CAPSULE STUDY N/A 11/26/2016   Procedure: GIVENS CAPSULE STUDY, if EGD negative, plan to drop capsule with scope if EGD negative;  Surgeon: Lin Landsman, MD;  Location: [facility] ENDOSCOPY;  Service: Gastroenterology;  Laterality: N/A;   IVC FILTER INSERTION N/A 11/14/2016   Procedure: IVC Filter Insertion;  Surgeon: Katha Cabal, MD;  Location: [place] CV LAB;  Service: Cardiovascular;  Laterality: N/A;   RIGHT/LEFT HEART CATH AND CORONARY ANGIOGRAPHY N/A 06/30/2019   Procedure: RIGHT/LEFT HEART CATH AND CORONARY ANGIOGRAPHY;  Surgeon: Larey Dresser, MD;  Location: [place] CV LAB;  Service: Cardiovascular;  Laterality: N/A;   VEIN SURGERY Right 2006   Vein Closure Procedure; RF ablation of right GSV   Social History   Socioeconomic History   Marital status: Single    Spouse name: Not on file   Number of children: 2   Years of education: Not on file   Highest education level: Not on file  Occupational History   Occupation: CELL    Employer: RETIRED  Tobacco Use   Smoking status: Former    Packs/day: 1.50    Years: 20.00    Pack years: 30.00    Types: Cigarettes    Quit date: 1999    Years since quitting: 23.5   Smokeless tobacco: Never  Vaping Use   Vaping Use: Never used  Substance and Sexual Activity   Alcohol use: No    Alcohol/week: 0.0 standard drinks   Drug use: No   Sexual activity: Not Currently  Other Topics Concern   Not on file  Social History Narrative   Independent at baseline. Lives at home with her brother; in [place]. Quit smoking 20 years; no alcohol.    Social Determinants of Health   Financial Resource Strain: Not on file  Food  Insecurity: Not on file  Transportation Needs: Not on file  Physical Activity: Not on file  Stress: Not on file  Social Connections: Not on file  Intimate Partner Violence: Not on file   Social History   Social History Narrative   Independent at baseline. Lives at home with her brother; in [place]. Quit smoking 20 years; no alcohol.      ROS: Negative.  PE: HEENT: Negative. Lungs: Clear. Cardio: RR.   Assessment/Plan:  Proceed with planned resection of GIST tumor of the small intestine. [facility] 11/05/2020

## 2020-11-05 NOTE — Anesthesia Preprocedure Evaluation (Addendum)
Anesthesia Evaluation  Patient identified by MRN, date of birth, ID band Patient awake    Reviewed: Allergy & Precautions, NPO status , Patient's Chart, lab work & pertinent test results  History of Anesthesia Complications Negative for: history of anesthetic complications  Airway Mallampati: III       Dental  (+) Partial Lower, Partial Upper, Dental Advidsory Given   Pulmonary shortness of breath, with exertion and Long-Term Oxygen Therapy, asthma , sleep apnea , COPD,  oxygen dependent, neg recent URI, former smoker,           Cardiovascular hypertension, (-) angina+ Peripheral Vascular Disease and +CHF  (-) Past MI and (-) Cardiac Stents (-) dysrhythmias (-) Valvular Problems/Murmurs     Neuro/Psych neg Seizures negative neurological ROS  negative psych ROS   GI/Hepatic Neg liver ROS, GERD  Medicated,  Endo/Other  diabetes, Type 2, Insulin Dependent, Oral Hypoglycemic Agents  Renal/GU negative Renal ROS     Musculoskeletal   Abdominal   Peds  Hematology   Anesthesia Other Findings Past Medical History: No date: Anemia 1950: Asthma No date: CHF (congestive heart failure) (HCC) No date: COPD (chronic obstructive pulmonary disease) (HCC) No date: Cor pulmonale ([place]) 2018: DVT (deep venous thrombosis) (HCC) No date: GERD (gastroesophageal reflux disease) No date: GIST (gastrointestinal stromal tumor) of small bowel,  malignant ([place]) 1949: Glaucoma No date: Hyperlipidemia No date: Hypertension No date: Obesity, unspecified No date: OSA treated with BiPAP No date: Personal history of tobacco use, presenting hazards to health No date: Pneumonia No date: Presence of IVC filter No date: Pulmonary HTN (HCC) No date: Supplemental oxygen dependent No date: T2DM (type 2 diabetes mellitus) (HCC) No date: Varicose veins of lower extremities with other complications   Reproductive/Obstetrics                              Anesthesia Physical  Anesthesia Plan  ASA: 3  Anesthesia Plan: General   Post-op Pain Management: GA combined w/ Regional for post-op pain   Induction: Intravenous  PONV Risk Score and Plan: 3 and Ondansetron, Dexamethasone, Midazolam and Treatment may vary due to age or medical condition  Airway Management Planned: Oral ETT  Additional Equipment:   Intra-op Plan:   Post-operative Plan: Extubation in OR  Informed Consent: I have reviewed the patients History and Physical, chart, labs and discussed the procedure including the risks, benefits and alternatives for the proposed anesthesia with the patient or authorized representative who has indicated his/her understanding and acceptance.       Plan Discussed with:   Anesthesia Plan Comments:        Anesthesia Quick Evaluation

## 2020-11-05 NOTE — Transfer of Care (Addendum)
Immediate Anesthesia Transfer of Care Note  Patient: Susan Barajas  Procedure(s) Performed: SMALL BOWEL RESECTION, REMOVAL OF GIST TUMOR AND LYMPH NODES, UMBILICAL HERNIA REPAIR  Patient Location: PACU  Anesthesia Type:General  Level of Consciousness: awake, alert  and oriented  Airway & Oxygen Therapy: Patient Spontanous Breathing and Patient connected to nasal cannula oxygen  Post-op Assessment: Report given to RN and Post -op Vital signs reviewed and stable  Post vital signs: Reviewed and stable  Last Vitals:  Vitals Value Taken Time  BP 100/52 11/05/20 1315  Temp 36.1 C 11/05/20 1314  Pulse 66 11/05/20 1321  Resp 20 11/05/20 1321  SpO2 88 % 11/05/20 1321  Vitals shown include unvalidated device data.  Last Pain:  Vitals:   11/05/20 1314  PainSc: 0-No pain         Complications: No notable events documented.

## 2020-11-05 NOTE — Anesthesia Procedure Notes (Signed)
Procedure Name: Intubation Date/Time: 11/05/2020 9:09 AM Performed by: Doreen Salvage, CRNA Pre-anesthesia Checklist: Patient identified, Patient being monitored, Timeout performed, Emergency Drugs available and Suction available Patient Re-evaluated:Patient Re-evaluated prior to induction Oxygen Delivery Method: Circle System Utilized Preoxygenation: Pre-oxygenation with 100% oxygen Induction Type: IV induction Ventilation: Mask ventilation without difficulty Laryngoscope Size: Mac, 3 and McGraph Grade View: Grade II Tube type: Oral Tube size: 7.0 mm Number of attempts: 1 Airway Equipment and Method: Stylet Placement Confirmation: ETT inserted through vocal cords under direct vision, positive ETCO2 and breath sounds checked- equal and bilateral Secured at: 19 cm Tube secured with: Tape Dental Injury: Teeth and Oropharynx as per pre-operative assessment

## 2020-11-05 NOTE — Anesthesia Procedure Notes (Signed)
Anesthesia Regional Block: TAP block   Pre-Anesthetic Checklist: , timeout performed,  Correct Patient, Correct Site, Correct Laterality,  Correct Procedure, Correct Position, site marked,  Risks and benefits discussed,  Surgical consent,  Pre-op evaluation,  At surgeon's request and post-op pain management  Laterality: Left and Right  Prep: chloraprep       Needles:  Injection technique: Single-shot  Needle Type: Echogenic Needle     Needle Length: 9cm  Needle Gauge: 21     Additional Needles:   Procedures:,,,, ultrasound used (permanent image in chart),,    Narrative:  Start time: 11/05/2020 12:37 PM End time: 11/05/2020 12:52 PM Injection made incrementally with aspirations every 5 mL.  Performed by: Personally  Anesthesiologist: Martha Clan, MD  Additional Notes: Functioning IV was confirmed and monitors were applied.  A 37m 22ga Stimuplex needle was used. Sterile prep and drape,hand hygiene and sterile gloves were used.  Negative aspiration and negative test dose prior to incremental administration of local anesthetic. The patient tolerated the procedure well.  The TAP was done bilaterally

## 2020-11-05 NOTE — Consult Note (Signed)
[facility] Medical Consultation  [place] Anna Baack QQV:[id number] DOB: August 08, 1945 DOA: 11/05/2020 PCP: Albina Billet, MD   Requesting physician: Dr Bary Castilla Date of consultation: 11/05/20 Reason for consultation: Management of medical problems which include diabetes mellitus COPD and chronic respiratory failure.  Impression/Recommendations Principal Problem:   GIST (gastrointestinal stromal tumor) of small bowel, malignant (HCC) Active Problems:   COPD (chronic obstructive pulmonary disease) (HCC)   Chronic respiratory failure with hypoxia (HCC)   OSA (obstructive sleep apnea)   Pulmonary hypertension (HCC)   Supplemental oxygen dependent   T2DM (type 2 diabetes mellitus) ([place])    GIST tumor of small bowel S/p resection of 8 cm GIST tumor of the proximal small bowel.  Patient underwent 69-monthsuppressive therapy with Gleevec with significant decrease in activity based on pet imaging and modest decrease in size. Patient had reached maximum benefit and is status post tumor excision. Further treatment plan per surgery.    2.  Diabetes mellitus Patient is n.p.o. and has nasogastric tube in place Hold oral hypoglycemic agents Sliding scale insulin for glycemic control    3. Hypertension Patient with relative hypotension postoperatively. Hold antihypertensive medications    4.  COPD with chronic respiratory failure Patient wears 3 L of oxygen continuous at home and is currently on a Ventimask postoperatively. Will wean patient to baseline home oxygen requirement Continue inhaled steroids and as needed bronchodilator therapy    5.  Pulmonary hypertension/chronic diastolic dysfunction CHF Hold diuretic for now due to relative hypotension  I will followup again tomorrow. Please contact me if I can be of assistance in the meanwhile. Thank you for this consultation.  Chief Complaint: Status post resection of GIST tumor  HPI: Patient is a 75year old  African-American female with a past medical history significant for COPD with chronic respiratory failure, obstructive sleep apnea on BiPAP at bedtime, chronic diastolic dysfunction CHF, pulmonary hypertension and history of GIST tumor involving the small bowel.  Patient was treated with GHobartfor 6 months and repeat PET scan showed significant decrease in activity as well as a modest decrease in size.  Patient was then scheduled for planned excision which was done today 11/05/20. I am unable to do review of systems on this patient as she just got out of the OR and is still very drowsy.  She opens her eyes to name call but does not respond to any questions.     Review of Systems: Unable to do review of systems on this patient since she is still very drowsy from anesthesia and is unable to answer any questions at this time   Past Medical History:  Diagnosis Date   Anemia    Asthma 1950   CHF (congestive heart failure) (HCC)    COPD ([place] obstructive pulmonary disease) ([place]    Cor pulmonale (HCC)    DVT (deep venous thrombosis) ([place] 2018   GERD (gastroesophageal reflux disease)    GIST (gastrointestinal stromal tumor) of small bowel, malignant ([place]    Glaucoma 1949   Hyperlipidemia    Hypertension    Obesity, unspecified    OSA treated with BiPAP    Personal history of tobacco use, presenting hazards to health    Pneumonia    Presence of IVC filter    Pulmonary HTN ([facility]    Supplemental oxygen dependent    T2DM (type 2 diabetes mellitus) ([place]    Varicose veins of lower extremities with other complications    Past Surgical History:  Procedure Laterality Date  BREAST BIOPSY Right 1991   BREAST BIOPSY Left 2013   COLONOSCOPY  2010   Dr. Vira Agar   COLONOSCOPY  Jan 2016   Dr Vira Agar   ESOPHAGOGASTRODUODENOSCOPY N/A 11/26/2016   Procedure: ESOPHAGOGASTRODUODENOSCOPY (EGD);  Surgeon: Lin Landsman, MD;  Location: [facility] ENDOSCOPY;  Service: Gastroenterology;  Laterality:  N/A;   GIVENS CAPSULE STUDY N/A 11/26/2016   Procedure: GIVENS CAPSULE STUDY, if EGD negative, plan to drop capsule with scope if EGD negative;  Surgeon: Lin Landsman, MD;  [facility] END[facility]COPY;  Service: Gastroenterology;  Laterality: N/A;   IVC FILTER INSERTION N/A 11/14/2016   Procedure: IVC Filter Insertion;  Surgeon: Katha Cabal, MD;  Location: [place] CV LAB;  Service: Cardiovascular;  Laterality: N/A;   RIGHT/LEFT HEART CATH AND CORONARY ANGIOGRAPHY N/A 06/30/2019   Procedure: RIGHT/LEFT HEART CATH AND CORONARY ANGIOGRAPHY;  Surgeon: Larey Dresser, MD;  Location: [place] CV LAB;  Service: Cardiovascular;  Laterality: N/A;   VEIN SURGERY Right 2006   Vein Closure Procedure; RF ablation of right GSV   Social History:  reports that she quit smoking about 23 years ago. Her smoking use included cigarettes. She has a 30.00 pack-year smoking history. She has never used smokeless tobacco. She reports that she does not drink alcohol and does not use drugs.  No Known Allergies Family History  Problem Relation Age of Onset   Prostate cancer Father    Other Sister        mouth cancer   Hypertension Mother     Prior to Admission medications   Medication Sig Start Date End Date Taking? Authorizing Provider  acetaminophen (TYLENOL) 325 MG tablet Take 2 tablets (650 mg total) by mouth every 6 (six) hours as needed for mild pain (or Fever >/= 101). 11/17/16  Yes Gouru, Aruna, MD  aspirin 81 MG chewable tablet Chew 1 tablet (81 mg total) by mouth daily. 10/26/16  Yes Wieting, Richard, MD  Calcium Carb-Cholecalciferol (CALCIUM/VITAMIN D PO) Take 3 tablets by mouth daily.   Yes [provider]  dapagliflozin propanediol (FARXIGA) 10 MG TABS tablet TAKE 1 TABLET BY MOUTH ONCE DAILY Patient taking differently: Take 10 mg by mouth daily. 09/25/20  Yes Bensimhon, Shaune Pascal, MD  ferrous gluconate (FERGON) 325 MG tablet Take 325 mg by mouth in the morning and at bedtime.     Yes [provider]  furosemide (LASIX) 20 MG tablet Take 20-40 mg by mouth See admin instructions. Take 40 mg by mouth in the morning and 20 mg in the evening   Yes [provider]  HUMALOG KWIKPEN 100 UNIT/ML KiwkPen Inject 10 Units into the skin 2 (two) times daily.  07/03/15  Yes [provider]  imatinib (GLEEVEC) 100 MG tablet TAKE 4 TABLETS (400 MG TOTAL) BY MOUTH DAILY. TAKE WITH MEALS AND LARGE GLASS OF WATER Patient taking differently: Take 400 mg by mouth daily. 10/19/20 10/19/21 Yes Cammie Sickle, MD  insulin glargine (LANTUS) 100 UNIT/ML injection Inject 32 Units into the skin in the morning.   Yes [provider]  latanoprost (XALATAN) 0.005 % ophthalmic solution Place 1 drop into both eyes at bedtime.    Yes [provider]  lovastatin (MEVACOR) 20 MG tablet Take 20 mg by mouth every evening.    Yes [provider]  metFORMIN (GLUCOPHAGE) 1000 MG tablet Take 1,000 mg by mouth daily.    Yes [provider]  Multiple Vitamin (MULTIVITAMIN) tablet Take 1 tablet by mouth daily.  Yes [provider]  omeprazole (PRILOSEC) 40 MG capsule Take 40 mg by mouth daily.   Yes [provider]  ondansetron (ZOFRAN) 8 MG tablet One pill every 8 hours as needed for nausea/vomitting. Patient taking differently: Take 8 mg by mouth every 8 (eight) hours as needed for nausea or vomiting. 03/12/20  Yes Cammie Sickle, MD  OPSUMIT 10 MG tablet Take 1 tablet daily. Patient taking differently: Take 10 mg by mouth daily. 06/18/20  Yes Larey Dresser, MD  potassium chloride SA (KLOR-CON) 20 MEQ tablet Take 1 tablet (20 mEq total) by mouth daily. 06/21/19  Yes Larey Dresser, MD  STIOLTO RESPIMAT 2.5-2.5 MCG/ACT AERS INHALE 2 PUFFS BY MOUTH ONCE DAILY Patient taking differently: Inhale 2 puffs into the lungs daily. 06/11/20  Yes Kasa, Maretta Bees, MD  tadalafil, PAH, (ALYQ) 20 MG tablet Take 2 tablets (40 mg total) by mouth  daily. 10/03/20  Yes Larey Dresser, MD  timolol (TIMOPTIC) 0.5 % ophthalmic solution Place 1 drop into both eyes 2 (two) times daily.  01/13/14  Yes [provider]  ACCU-CHEK AVIVA PLUS test strip 1 each 2 (two) times daily. 12/10/19   [provider]  Accu-Chek Softclix Lancets lancets USE 1 TO CHECK GLUCOSE TWICE DAILY 08/26/20   [provider]  furosemide (LASIX) 40 MG tablet Take 1 tablet (40 mg total) by mouth in the morning AND 0.5 tablets (20 mg total) every evening. 10/28/19   Larey Dresser, MD   Physical Exam: Blood pressure (!) 100/52, pulse 72, temperature (!) 97 F (36.1 C), resp. rate 20, height 5' 5" (1.651 m), weight 88.5 kg, SpO2 95 %. Vitals:   11/05/20 1314 11/05/20 1315  BP:  (!) 100/52  Pulse: 66 72  Resp: 16 20  Temp: (!) 97 F (36.1 C)   SpO2: 95% 95%    General: Lying in bed.  Appears comfortable and in no obvious distress.  Opens eyes to call but unable to answer questions. Eyes: Pale conjunctiva ENT: NG tube in place Neck: Supple, no JVD Cardiovascular: Regular rate and rhythm, S1,S2 Respiratory: Air entry in all lung fields Abdomen: Hypoactive bowel sounds, midline dressing, soft Skin: Warm and dry Musculoskeletal: Within normal limits Psychiatric: Unable to assess Neurologic: Spontaneous movement of extremities  Labs on Admission:  Basic Metabolic Panel: No results for input(s): NA, K, CL, CO2, GLUCOSE, BUN, CREATININE, CALCIUM, MG, PHOS in the last 168 hours. Liver Function Tests: No results for input(s): AST, ALT, ALKPHOS, BILITOT, PROT, ALBUMIN in the last 168 hours. No results for input(s): LIPASE, AMYLASE in the last 168 hours. No results for input(s): AMMONIA in the last 168 hours. CBC: No results for input(s): WBC, NEUTROABS, HGB, HCT, MCV, PLT in the last 168 hours. Cardiac Enzymes: No results for input(s): CKTOTAL, CKMB, CKMBINDEX, TROPONINI in the last 168 hours. BNP: Invalid input(s): POCBNP CBG: Recent  Labs  Lab 11/05/20 0722 11/05/20 1310  GLUCAP 88 153*    Radiological Exams on Admission: [name] OR NERVE BLOCK-IMAGE ONLY [facility])  Result Date: 11/05/2020 There is no interpretation for this exam.  This order is for images obtained during a surgical procedure.  Please See "Surgeries" Tab for more information regarding the procedure.    EKG: Independently reviewed.   Time spent: 55 minutes  Malak Orantes [facility] Pager [phone]  If 7PM-7AM, please contact night-coverage [email or web address] Password [email or web address] 11/05/2020, 1:19 PM

## 2020-11-05 NOTE — Anesthesia Postprocedure Evaluation (Signed)
Anesthesia Post Note  Patient: Susan Barajas  Procedure(s) Performed: SMALL BOWEL RESECTION, REMOVAL OF GIST TUMOR AND LYMPH NODES, UMBILICAL HERNIA REPAIR  Patient location during evaluation: PACU Anesthesia Type: General Level of consciousness: awake and alert Pain management: pain level controlled Vital Signs Assessment: post-procedure vital signs reviewed and stable Respiratory status: spontaneous breathing, nonlabored ventilation, respiratory function stable and patient connected to nasal cannula oxygen Cardiovascular status: blood pressure returned to baseline and stable Postop Assessment: no apparent nausea or vomiting Anesthetic complications: no   No notable events documented.   Last Vitals:  Vitals:   11/05/20 1356 11/05/20 1436  BP: (!) 109/48 (!) 113/46  Pulse: 68 68  Resp: 16 15  Temp: 36.4 C 36.5 C  SpO2: 93% 92%    Last Pain:  Vitals:   11/05/20 1436  TempSrc: Oral  PainSc:                  Martha Clan

## 2020-11-06 ENCOUNTER — Encounter: Payer: Self-pay | Admitting: General Surgery

## 2020-11-06 DIAGNOSIS — C49A3 Gastrointestinal stromal tumor of small intestine: Principal | ICD-10-CM

## 2020-11-06 LAB — GLUCOSE, CAPILLARY
Glucose-Capillary: 111 mg/dL — ABNORMAL HIGH (ref 70–99)
Glucose-Capillary: 124 mg/dL — ABNORMAL HIGH (ref 70–99)
Glucose-Capillary: 127 mg/dL — ABNORMAL HIGH (ref 70–99)
Glucose-Capillary: 182 mg/dL — ABNORMAL HIGH (ref 70–99)
Glucose-Capillary: 216 mg/dL — ABNORMAL HIGH (ref 70–99)
Glucose-Capillary: 219 mg/dL — ABNORMAL HIGH (ref 70–99)
Glucose-Capillary: 232 mg/dL — ABNORMAL HIGH (ref 70–99)

## 2020-11-06 LAB — BASIC METABOLIC PANEL
Anion gap: 5 (ref 5–15)
BUN: 20 mg/dL (ref 8–23)
CO2: 26 mmol/L (ref 22–32)
Calcium: 8.1 mg/dL — ABNORMAL LOW (ref 8.9–10.3)
Chloride: 108 mmol/L (ref 98–111)
Creatinine, Ser: 0.73 mg/dL (ref 0.44–1.00)
GFR, Estimated: >60 mL/min (ref >60)
Glucose, Bld: 230 mg/dL — ABNORMAL HIGH (ref 70–99)
Potassium: 4.1 mmol/L (ref 3.5–5.1)
Sodium: 139 mmol/L (ref 135–145)

## 2020-11-06 LAB — CBC
HCT: 28.7 % — ABNORMAL LOW (ref 36.0–46.0)
Hemoglobin: 9.5 g/dL — ABNORMAL LOW (ref 12.0–15.0)
MCH: 30.7 pg (ref 26.0–34.0)
MCHC: 33.1 g/dL (ref 30.0–36.0)
MCV: 92.9 fL (ref 80.0–100.0)
Platelets: 204 10*3/uL (ref 150–400)
RBC: 3.09 MIL/uL — ABNORMAL LOW (ref 3.87–5.11)
RDW: 16.9 % — ABNORMAL HIGH (ref 11.5–15.5)
WBC: 11.2 10*3/uL — ABNORMAL HIGH (ref 4.0–10.5)
nRBC: 0 % (ref 0.0–0.2)

## 2020-11-06 LAB — HEMOGLOBIN A1C
Hgb A1c MFr Bld: 6.3 % — ABNORMAL HIGH (ref 4.8–5.6)
Mean Plasma Glucose: 134 mg/dL

## 2020-11-06 MED ORDER — SODIUM CHLORIDE 0.9 % IV BOLUS
500.0000 mL | Freq: Once | INTRAVENOUS | Status: AC
  Administered 2020-11-06: 500 mL via INTRAVENOUS

## 2020-11-06 MED ORDER — INSULIN GLARGINE-YFGN 100 UNIT/ML ~~LOC~~ SOLN
10.0000 [IU] | Freq: Every day | SUBCUTANEOUS | Status: DC
  Administered 2020-11-06 – 2020-11-10 (×5): 10 [IU] via SUBCUTANEOUS
  Filled 2020-11-06 (×6): qty 0.1

## 2020-11-06 MED ORDER — MOMETASONE FURO-FORMOTEROL FUM 200-5 MCG/ACT IN AERO
2.0000 | INHALATION_SPRAY | Freq: Two times a day (BID) | RESPIRATORY_TRACT | Status: DC
  Administered 2020-11-06 – 2020-11-10 (×8): 2 via RESPIRATORY_TRACT
  Filled 2020-11-06: qty 8.8

## 2020-11-06 NOTE — Progress Notes (Signed)
Inpatient Diabetes Program Recommendations  AACE/ADA: New Consensus Statement on Inpatient Glycemic Control (2015)  Target Ranges:  Prepandial:   less than 140 mg/dL      Peak postprandial:   less than 180 mg/dL (1-2 hours)      Critically ill patients:  140 - 180 mg/dL   Lab Results  Component Value Date   GLUCAP 219 (H) 11/06/2020   HGBA1C 6.6 (H) 10/22/2016    Review of Glycemic Control Results for Susan Barajas, Susan Barajas (MRN [id number]) as of 11/06/2020 10:48  Ref. Range 11/05/2020 07:22 11/05/2020 13:10 11/05/2020 16:36 11/05/2020 19:58 11/06/2020 00:00 11/06/2020 03:45 11/06/2020 07:42  Glucose-Capillary Latest Ref Range: 70 - 99 mg/dL 88 153 (H) 205 (H) 242 (H) 232 (H) 216 (H) 219 (H)   Diabetes history: DM 2 Outpatient Diabetes medications: Farxiga 10 mg Daily, Metformin 1000 mg Daily, Lantus 32 units, humalog 10 units tid Current orders for Inpatient glycemic control:  Novolog 0-15 units Q4 hours  Inpatient Diabetes Program Recommendations:    Note: Decadron 5 mg given yesterday, glucose tends consistently in the 200 range  -  Add Glargine (basal insulin) 10 units (1/3 home dose)  Thanks,  Tama Headings RN, MSN, BC-ADM Inpatient Diabetes Coordinator Team Pager [phone] (8a-5p)

## 2020-11-06 NOTE — Progress Notes (Signed)
PROGRESS NOTE    [place] Anna Cogliano  YKD:[id number] DOB: Mar 30, 1946 DOA: 11/05/2020 PCP: Albina Billet, MD  Brief Narrative: Ms. 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 Hervey Ard yesterday. -[facility] was consulted for medical management   Assessment & Plan:   Principal Problem:   GIST (gastrointestinal stromal tumor) of small bowel, malignant ([place]) -Completed 6 months of Gleevec, underwent resection of GIST tumor/proximal small bowel yesterday -Has NG tube to suction, had a BM today -Diet, NG clamping per surgery  Type 2 diabetes mellitus -Patient is n.p.o., CBGs are in the 200s -Will resume Lantus at low-dose today-10 units -Holding S[place]cliffe while n.p.o.  Hypertension -Patient was up hypotensive overnight requiring fluid bolus, blood pressure stable today, hold antihypertensives, resume slowly  COPD/chronic respiratory failure -On 4 L O2 at rest and 6 L with activity -Continue inhaled steroids and PRN bronchodilator therapy  Pulmonary hypertension Chronic diastolic CHF -Clinically appears euvolemic at this time, hold off on diuretics and antihypertensives with soft/low blood pressures overnight -Opsumit, tadalafil on hold while n.p.o.  DVT prophylaxis: Lovenox Code Status: Full code Family Communication: Discussed with daughter at bedside Disposition Plan:  Status is: Inpatient  Remains inpatient appropriate because:Inpatient level of care appropriate due to severity of illness  Dispo: The patient is from: Home               Anticipated d/c is to: Home              Patient currently is not medically stable to d/c.   Difficult to place patient No     Procedures: Proximal small bowel/GIST tumor resection 7/25  Antimicrobials:    Subjective: -Feels okay overall, mild abdominal discomfort, some nausea, no vomiting, had a small BM this morning, denies any chest pain or dyspnea  Objective: Vitals:   11/06/20 0348 11/06/20 0621 11/06/20 0737 11/06/20 0834  BP: (!) 95/45 (!) 100/45 (!) 111/50   Pulse: 78 69 79 79  Resp: _0 Temp: 98.4 F (36.9 C)  98.4 F (36.9 C)   TempSrc: Oral  Oral   SpO2: 96% 95% 93% 93%  Weight:      Height:        Intake/Output Summary (Last 24 hours) at 11/06/2020 1107 Last data filed at 11/06/2020 3976 Gross per 24 hour  Intake 1761.43 ml  Output 751 ml  Net 1010.43 ml   Filed Weights   11/05/20 0725  Weight: 88.5 kg    Examination:  General exam: Chronically ill obese female sitting up in bed, AAOx3, no distress HEENT: NG tube to intermittent suction CVS: S1-S2, regular rate rhythm Lungs: Decreased breath sounds to bases otherwise clear Abdomen: Soft, mild tenderness as expected, linear abdominal incision, bowel sounds present but decreased Extremities: Trace edema Skin: No rashes on exposed skin Psychiatry: Mood & affect appropriate.     Data Reviewed:   CBC: Recent Labs  Lab 11/05/20 1335 11/06/20 0530  WBC 9.6 11.2*  NEUTROABS 8.8*  --   HGB 11.2* 9.5*  HCT 34.7* 28.7*  MCV 92.0 92.9  PLT 238 204  Basic Metabolic Panel: Recent Labs  Lab 11/06/20 0530  NA 139  K 4.1  CL 108  CO2 26  GLUCOSE 230*  BUN 20  CREATININE 0.73  CALCIUM 8.1*   GFR: Estimated Creatinine Clearance: 66.8 mL/min (by C-G formula based on SCr of 0.73 mg/dL). Liver Function Tests: No results for input(s): AST, ALT, ALKPHOS, BILITOT, PROT, ALBUMIN in the last 168 hours. No results for input(s): LIPASE, AMYLASE in the last 168 hours. No results for  input(s): AMMONIA in the last 168 hours. Coagulation Profile: Recent Labs  Lab 11/01/20 0830  INR 1.0   Cardiac Enzymes: No results for input(s): CKTOTAL, CKMB, CKMBINDEX, TROPONINI in the last 168 hours. BNP (last 3 results) No results for input(s): PROBNP in the last 8760 hours. HbA1C: No results for input(s): HGBA1C in the last 72 hours. CBG: Recent Labs  Lab 11/05/20 1636 11/05/20 1958 11/06/20 0000 11/06/20 0345 11/06/20 0742  GLUCAP 205* 242* 232* 216* 219*   Lipid Profile: No results for input(s): CHOL, HDL, LDLCALC, TRIG, CHOLHDL, LDLDIRECT in the last 72 hours. Thyroid Function Tests: No results for input(s): TSH, T4TOTAL, FREET4, T3FREE, THYROIDAB in the last 72 hours. Anemia Panel: No results for input(s): VITAMINB12, FOLATE, FERRITIN, TIBC, IRON, RETICCTPCT in the last 72 hours. Urine analysis:    Component Value Date/Time   COLORURINE AMBER (A) 02/10/2020 1408   APPEARANCEUR CLOUDY (A) 02/10/2020 1408   LABSPEC 1.017 02/10/2020 1408   PHURINE 6.0 02/10/2020 1408   GLUCOSEU >=500 (A) 02/10/2020 1408   HGBUR NEGATIVE 02/10/2020 1408   BILIRUBINUR NEGATIVE 02/10/2020 1408   KETONESUR NEGATIVE 02/10/2020 1408   PROTEINUR NEGATIVE 02/10/2020 1408   NITRITE NEGATIVE 02/10/2020 1408   LEUKOCYTESUR NEGATIVE 02/10/2020 1408   Sepsis Labs: _0 (procalcitonin:4,lacticidven:4)  ) Recent Results (from the past 240 hour(s))  SARS CORONAVIRUS 2 (TAT 6-24 HRS) Nasopharyngeal Nasopharyngeal Swab     Status: None   Collection Time: 11/01/20  8:44 AM   Specimen: Nasopharyngeal Swab  Result Value Ref Range Status   SARS Coronavirus 2 NEGATIVE NEGATIVE Final    Comment: (NOTE) SARS-CoV-2 target nucleic acids are NOT DETECTED.  The SARS-CoV-2 RNA is generally detectable in upper and lower respiratory specimens during the acute phase of infection. Negative results do not preclude SARS-CoV-2 infection, do not rule out co-infections with other pathogens, and  should not be used as the sole basis for treatment or other patient management decisions. Negative results must be combined with clinical observations, patient history, and epidemiological information. The expected result is Negative.  Fact Sheet for Patients: SugarRoll.be  Fact Sheet for Healthcare Providers: https://www.woods-mathews.com/  This test is not yet approved or cleared by the [place] FDA and  has been authorized for detection and/or diagnosis of SARS-CoV-2 by FDA under an Emergency Use Authorization (EUA). This EUA will remain  in effect (meaning this test can be used) for the duration of the COVID-19 declaration under Se ction 564(b)(1) of the Act, 21 U.S.C. section 360bbb-3(b)(1), unless the authorization is terminated or revoked sooner.  Performed at [facility], [place] [address] [place], [place] [address]          Radiology Studies: [name] OR NERVE BLOCK-IMAGE ONLY [facility])  Result Date: 11/05/2020 There is no interpretation for this exam.  This order is for images obtained during a surgical procedure.  Please See "Surgeries" Tab for more information regarding the procedure.        Scheduled Meds:  alvimopan  12 mg Oral BID  enoxaparin (LOVENOX) injection  40 mg Subcutaneous Q24H   insulin aspart  0-15 Units Subcutaneous Q4H   insulin glargine-yfgn  10 Units Subcutaneous Daily   latanoprost  1 drop Both Eyes QHS   mometasone-formoterol  2 puff Inhalation BID   pantoprazole (PROTONIX) IV  40 mg Intravenous QHS   timolol  1 drop Both Eyes BID   umeclidinium bromide  1 puff Inhalation Daily   Continuous Infusions:   LOS: 1 day    Time spent: 34mn  PDomenic Polite MD [facility]   11/06/2020, 11:07 AM

## 2020-11-06 NOTE — Progress Notes (Addendum)
Afebile.  Modest decrease in BP last PM without associated tachycardia. Modest increase with fluid bolus administered by medicine. Patient without symptoms.  Mild nausea last PM, resolved with IV Zofran.   No pain.  Lungs: Clear. Cardio: RR. ABD: Soft, minimal BS. Dressing: Scant drainage. Exterm: Soft.  Labs:  HGB down from 11.2 to 9.5. Consistent with OR blood loss of 1,000 cc. Minimal WBC elevation. Lytes: Normal.  BS up. (On D5NS per medicine).  NG: 250 overnight. U/O: 250 overnight.  All in all, looks good. Will continue NG drainage today.   She is ambulating well.   Incentive spirometer use will be encouraged.  Lovenox today.  Will hold if significant fall in HGB as she has an IVC filter.

## 2020-11-07 DIAGNOSIS — E1169 Type 2 diabetes mellitus with other specified complication: Secondary | ICD-10-CM | POA: Diagnosis not present

## 2020-11-07 DIAGNOSIS — I272 Pulmonary hypertension, unspecified: Secondary | ICD-10-CM | POA: Diagnosis not present

## 2020-11-07 DIAGNOSIS — C49A3 Gastrointestinal stromal tumor of small intestine: Secondary | ICD-10-CM | POA: Diagnosis not present

## 2020-11-07 LAB — GLUCOSE, CAPILLARY
Glucose-Capillary: 111 mg/dL — ABNORMAL HIGH (ref 70–99)
Glucose-Capillary: 122 mg/dL — ABNORMAL HIGH (ref 70–99)
Glucose-Capillary: 123 mg/dL — ABNORMAL HIGH (ref 70–99)
Glucose-Capillary: 137 mg/dL — ABNORMAL HIGH (ref 70–99)
Glucose-Capillary: 78 mg/dL (ref 70–99)
Glucose-Capillary: 84 mg/dL (ref 70–99)

## 2020-11-07 LAB — BASIC METABOLIC PANEL
Anion gap: 6 (ref 5–15)
BUN: 16 mg/dL (ref 8–23)
CO2: 26 mmol/L (ref 22–32)
Calcium: 8.7 mg/dL — ABNORMAL LOW (ref 8.9–10.3)
Chloride: 110 mmol/L (ref 98–111)
Creatinine, Ser: 0.53 mg/dL (ref 0.44–1.00)
GFR, Estimated: >60 mL/min (ref >60)
Glucose, Bld: 131 mg/dL — ABNORMAL HIGH (ref 70–99)
Potassium: 3.6 mmol/L (ref 3.5–5.1)
Sodium: 142 mmol/L (ref 135–145)

## 2020-11-07 LAB — CBC
HCT: 28.7 % — ABNORMAL LOW (ref 36.0–46.0)
Hemoglobin: 9.3 g/dL — ABNORMAL LOW (ref 12.0–15.0)
MCH: 30 pg (ref 26.0–34.0)
MCHC: 32.4 g/dL (ref 30.0–36.0)
MCV: 92.6 fL (ref 80.0–100.0)
Platelets: 207 10*3/uL (ref 150–400)
RBC: 3.1 MIL/uL — ABNORMAL LOW (ref 3.87–5.11)
RDW: 16.9 % — ABNORMAL HIGH (ref 11.5–15.5)
WBC: 9.4 10*3/uL (ref 4.0–10.5)
nRBC: 0 % (ref 0.0–0.2)

## 2020-11-07 MED ORDER — DEXTROSE-NACL 5-0.9 % IV SOLN: INTRAVENOUS | Status: DC

## 2020-11-07 MED ORDER — MENTHOL 3 MG MT LOZG
1.0000 | LOZENGE | OROMUCOSAL | Status: DC | PRN
  Administered 2020-11-07: 3 mg via ORAL
  Filled 2020-11-07: qty 9

## 2020-11-07 NOTE — Progress Notes (Signed)
T-max 99.4.  Blood pressure stable.  Heart rate below 80.  Unlabored respiration.  Oxygen saturation in the high to mid 90s on 3-4 L nasal cannula.  No nausea.  NG drainage 400 cc overnight.  Positive void.  No further bowel movements.  No pain.  The patient has ambulated to the restroom only since surgery.  Chest exam was clear.  Cardiac exam showed a regular rhythm without murmur or gallop.  The abdomen was soft and nontender.  No unusual distention.  Bowel sounds markedly improved from yesterday.  Extremities showed no calf tenderness.  Impression doing well post resection of the proximal jejunum for a GIST tumor.  With the NG drainage at this point we will continue NG drainage overnight.  We will attempt to have the patient ambulate in the hall.

## 2020-11-07 NOTE — Progress Notes (Addendum)
Mobility Specialist - Progress Note   11/07/20 1600  Mobility  Activity Ambulated in hall  Level of Assistance Modified independent, requires aide device or extra time  [place] wheel walker  Distance Ambulated (ft) 350 ft  Mobility Ambulated independently in hallway  Mobility Response Tolerated well  Mobility performed by Mobility specialist  $Mobility charge 1 Mobility    Pt ambulated in hallway with RW, no LOB. Initially denied pain, but does develop mild soreness during ambulation. O2 desat to high 70s on 4L but pt states this is baseline for her when walking. Denied SOB. Independent in seated peri-care. Pt returned to bed with needs in reach, family at bedside. O2 95% at exit.    Kathee Delton Mobility Specialist 11/07/20, 4:05 PM

## 2020-11-07 NOTE — Progress Notes (Addendum)
PROGRESS NOTE    Susan Barajas  ZXY:[id number] DOB: 07/02/1945 DOA: 11/05/2020 PCP: Albina Billet, MD   Assessment & Plan:   Principal Problem:   GIST (gastrointestinal stromal tumor) of small bowel, malignant ([place]) Active Problems:   COPD (chronic obstructive pulmonary disease) ([facility])   Chronic respiratory failure with hypoxia ([facility])   OSA (obstructive sleep apnea)   Pulmonary hypertension ([facility])   Supplemental oxygen dependent   T2DM (type 2 diabetes mellitus) ([place])  GIST/gastrointestinal stromal tumor of small bowel: completed 6 months of Gleevec, underwent resection of GIST tumor/proximal small bowel. Continue w/ NG tube as per gen surg. No gas or BM today so far   DM2: likely poorly controlled. Continue on glargine, SSI w/ accuchecks  HTN: continue to hold all anti-HTN meds    COPD & chronic respiratory failure: continue on supplemental oxygen and continue on bronchodilators    Pulmonary  HTN: hold home dose of opsumit, tadalfil while NPO  Chronic diastolic CHF: appears euvolemic. Continue to hold diuretics     DVT prophylaxis: lovenox  Code Status: full  Family Communication:  Disposition Plan: depends on PT/OT recs (not consulted yet)   Level of care: Med-Surg Status is: Inpatient  Remains inpatient appropriate because:Ongoing diagnostic testing needed not appropriate for outpatient work up, Unsafe d/c plan, IV treatments appropriate due to intensity of illness or inability to take PO, and Inpatient level of care appropriate due to severity of illness; still has NG tube   Dispo: The patient is from: Home              Anticipated d/c is to: Home              Patient currently is not medically stable to d/c. Still have NG tube   Difficult to place patient : unclear    Consultants:  Hospitalist    Procedures:   Antimicrobials:  Subjective: Pt c/o abd pain  Objective: Vitals:   11/06/20 0834 11/06/20 1524 11/06/20 1934 11/07/20 0437  BP:  (!)  108/50 (!) 107/54 104/65  Pulse: 79 63 75 72  Resp: _0 Temp:  99 F (37.2 C) 99.8 F (37.7 C) 99.4 F (37.4 C)  TempSrc:  Oral Oral Oral  SpO2: 93% 94% 96% 97%  Weight:      Height:        Intake/Output Summary (Last 24 hours) at 11/07/2020 0728 Last data filed at 11/06/2020 1858 Gross per 24 hour  Intake --  Output 601 ml  Net -601 ml   Filed Weights   11/05/20 0725  Weight: 88.5 kg    Examination:  General exam: Appears calm and comfortable  Respiratory system: Clear to auscultation. Respiratory effort normal. Cardiovascular system: S1 & S2 +. No rubs, gallops or clicks. Gastrointestinal system: Abdomen is nondistended, soft and nontender. Normal bowel sounds heard. Central nervous system: Alert and oriented. Moves all extremities Psychiatry: Judgement and insight appear normal. Flat mood and affect.     Data Reviewed: I have personally reviewed following labs and imaging studies  CBC: Recent Labs  Lab 11/05/20 1335 11/06/20 0530 11/07/20 0534  WBC 9.6 11.2* 9.4  NEUTROABS 8.8*  --   --   HGB 11.2* 9.5* 9.3*  HCT 34.7* 28.7* 28.7*  MCV 92.0 92.9 92.6  PLT 238 204 413   Basic Metabolic Panel: Recent Labs  Lab 11/06/20 0530 11/07/20 0534  NA 139 142  K 4.1 3.6  CL 108 110  CO2 26 26  GLUCOSE 230* 131*  BUN 20 16  CREATININE 0.73 0.53  CALCIUM 8.1* 8.7*   GFR: Estimated Creatinine Clearance: 66.8 mL/min (by C-G formula based on SCr of 0.53 mg/dL). Liver Function Tests: No results for input(s): AST, ALT, ALKPHOS, BILITOT, PROT, ALBUMIN in the last 168 hours. No results for input(s): LIPASE, AMYLASE in the last 168 hours. No results for input(s): AMMONIA in the last 168 hours. Coagulation Profile: Recent Labs  Lab 11/01/20 0830  INR 1.0   Cardiac Enzymes: No results for input(s): CKTOTAL, CKMB, CKMBINDEX, TROPONINI in the last 168 hours. BNP (last 3 results) No results for input(s): PROBNP in the last 8760 hours. HbA1C: Recent  Labs    11/05/20 1335  HGBA1C 6.3*   CBG: Recent Labs  Lab 11/06/20 1125 11/06/20 1521 11/06/20 2023 11/06/20 2330 11/07/20 0435  GLUCAP 182* 127* 111* 124* 137*   Lipid Profile: No results for input(s): CHOL, HDL, LDLCALC, TRIG, CHOLHDL, LDLDIRECT in the last 72 hours. Thyroid Function Tests: No results for input(s): TSH, T4TOTAL, FREET4, T3FREE, THYROIDAB in the last 72 hours. Anemia Panel: No results for input(s): VITAMINB12, FOLATE, FERRITIN, TIBC, IRON, RETICCTPCT in the last 72 hours. Sepsis Labs: No results for input(s): PROCALCITON, LATICACIDVEN in the last 168 hours.  Recent Results (from the past 240 hour(s))  SARS CORONAVIRUS 2 (TAT 6-24 HRS) Nasopharyngeal Nasopharyngeal Swab     Status: None   Collection Time: 11/01/20  8:44 AM   Specimen: Nasopharyngeal Swab  Result Value Ref Range Status   SARS Coronavirus 2 NEGATIVE NEGATIVE Final    Comment: (NOTE) SARS-CoV-2 target nucleic acids are NOT DETECTED.  The SARS-CoV-2 RNA is generally detectable in upper and lower respiratory specimens during the acute phase of infection. Negative results do not preclude SARS-CoV-2 infection, do not rule out co-infections with other pathogens, and should not be used as the sole basis for treatment or other patient management decisions. Negative results must be combined with clinical observations, patient history, and epidemiological information. The expected result is Negative.  Fact Sheet for Patients: SugarRoll.be  Fact Sheet for Healthcare Providers: https://www.woods-mathews.com/  This test is not yet approved or cleared by the [place] FDA and  has been authorized for detection and/or diagnosis of SARS-CoV-2 by FDA under an Emergency Use Authorization (EUA). This EUA will remain  in effect (meaning this test can be used) for the duration of the COVID-19 declaration under Se ction 564(b)(1) of the Act, 21 U.S.C. section  360bbb-3(b)(1), unless the authorization is terminated or revoked sooner.  Performed at [facility], [place] [address] [place], [place] [address]          Radiology Studies: [name] OR NERVE BLOCK-IMAGE ONLY [facility])  Result Date: 11/05/2020 There is no interpretation for this exam.  This order is for images obtained during a surgical procedure.  Please See "Surgeries" Tab for more information regarding the procedure.        Scheduled Meds:  alvimopan  12 mg Oral BID   enoxaparin (LOVENOX) injection  40 mg Subcutaneous Q24H   insulin aspart  0-15 Units Subcutaneous Q4H   insulin glargine-yfgn  10 Units Subcutaneous Daily   latanoprost  1 drop Both Eyes QHS   mometasone-formoterol  2 puff Inhalation BID   pantoprazole (PROTONIX) IV  40 mg Intravenous QHS   timolol  1 drop Both Eyes BID   umeclidinium bromide  1 puff Inhalation Daily   Continuous Infusions:   LOS: 2 days    Time spent: 73  mins    Wyvonnia Dusky, MD [facility] Pager [phone]  If 7PM-7AM, please contact night-coverage 11/07/2020, 7:28 AM

## 2020-11-08 DIAGNOSIS — I272 Pulmonary hypertension, unspecified: Secondary | ICD-10-CM | POA: Diagnosis not present

## 2020-11-08 DIAGNOSIS — C49A3 Gastrointestinal stromal tumor of small intestine: Secondary | ICD-10-CM | POA: Diagnosis not present

## 2020-11-08 DIAGNOSIS — E876 Hypokalemia: Secondary | ICD-10-CM

## 2020-11-08 LAB — CBC
HCT: 28.4 % — ABNORMAL LOW (ref 36.0–46.0)
Hemoglobin: 9.1 g/dL — ABNORMAL LOW (ref 12.0–15.0)
MCH: 30.1 pg (ref 26.0–34.0)
MCHC: 32 g/dL (ref 30.0–36.0)
MCV: 94 fL (ref 80.0–100.0)
Platelets: 207 10*3/uL (ref 150–400)
RBC: 3.02 MIL/uL — ABNORMAL LOW (ref 3.87–5.11)
RDW: 16.5 % — ABNORMAL HIGH (ref 11.5–15.5)
WBC: 8.8 10*3/uL (ref 4.0–10.5)
nRBC: 0 % (ref 0.0–0.2)

## 2020-11-08 LAB — BASIC METABOLIC PANEL
Anion gap: 8 (ref 5–15)
BUN: 15 mg/dL (ref 8–23)
CO2: 26 mmol/L (ref 22–32)
Calcium: 8.5 mg/dL — ABNORMAL LOW (ref 8.9–10.3)
Chloride: 110 mmol/L (ref 98–111)
Creatinine, Ser: 0.52 mg/dL (ref 0.44–1.00)
GFR, Estimated: >60 mL/min (ref >60)
Glucose, Bld: 146 mg/dL — ABNORMAL HIGH (ref 70–99)
Potassium: 3.1 mmol/L — ABNORMAL LOW (ref 3.5–5.1)
Sodium: 144 mmol/L (ref 135–145)

## 2020-11-08 LAB — GLUCOSE, CAPILLARY
Glucose-Capillary: 126 mg/dL — ABNORMAL HIGH (ref 70–99)
Glucose-Capillary: 130 mg/dL — ABNORMAL HIGH (ref 70–99)
Glucose-Capillary: 142 mg/dL — ABNORMAL HIGH (ref 70–99)
Glucose-Capillary: 202 mg/dL — ABNORMAL HIGH (ref 70–99)
Glucose-Capillary: 205 mg/dL — ABNORMAL HIGH (ref 70–99)
Glucose-Capillary: 80 mg/dL (ref 70–99)

## 2020-11-08 LAB — SURGICAL PATHOLOGY

## 2020-11-08 LAB — MAGNESIUM: Magnesium: 2.2 mg/dL (ref 1.7–2.4)

## 2020-11-08 MED ORDER — ACETAMINOPHEN 325 MG PO TABS: 650.0000 mg | ORAL_TABLET | ORAL | Status: DC | PRN

## 2020-11-08 MED ORDER — IBUPROFEN 400 MG PO TABS: 400.0000 mg | ORAL_TABLET | ORAL | Status: DC | PRN

## 2020-11-08 MED ORDER — POTASSIUM CHLORIDE 10 MEQ/100ML IV SOLN
10.0000 meq | INTRAVENOUS | Status: AC
  Administered 2020-11-08 (×4): 10 meq via INTRAVENOUS
  Filled 2020-11-08 (×2): qty 100

## 2020-11-08 NOTE — Care Management Important Message (Signed)
Important Message  Patient Details  Name: Susan Barajas MRN: [id number] Date of Birth: 12/26/1945   Medicare Important Message Given:  Yes     Dannette Barbara 11/08/2020, 2:53 PM

## 2020-11-08 NOTE — Progress Notes (Addendum)
Mobility Specialist - Progress Note   11/08/20 1100  Mobility  Activity Ambulated in hall  Level of Assistance Standby assist, set-up cues, supervision of patient - no hands on  Assistive Device Front wheel walker  Distance Ambulated (ft) 350 ft  Mobility Ambulated with assistance in hallway  Mobility Response Tolerated well  Mobility performed by Mobility specialist  $Mobility charge 1 Mobility    Pre-mobility: 84 HR, 93% SpO2 During mobility: 113 HR, 85% SpO2 Post-mobility: 89 HR, 91% SpO2   Pt sitting in recliner upon arrival, utilizing 4L. Bumped to 6L for OOB activity. Ambulated in hallway with RW, a little unsteady this date but no LOB. Denied SOB despite O2 maintaining low-mid 80s. Denied pain/soreness. Pt returned to recliner with all needs in reach.    Kathee Delton Mobility Specialist 11/08/20, 11:49 AM

## 2020-11-08 NOTE — Progress Notes (Signed)
Tmax 99.7, VSS. O2 sat stable at 4L. No pain. Ambulated twice yesterday. BM at 4 AM. Passing flatus.   Lungs: Clear. Cardio: RR with frequent premature beats. (New from yesterday). ABD: Soft, non-tender.   Dressing: Scant old drainage. Extrem: Soft.   NG was at 40 mm HG, intermittent. Reconnected to continuous with about 50 cc bilious material returned.  Clamped.  Path still pending.  IMP: Trial NG clamping. Continue aggressive ambulation. K+ replacement.

## 2020-11-08 NOTE — Progress Notes (Signed)
Mobility Specialist - Progress Note   11/08/20 1600  Mobility  Activity Ambulated in hall  Level of Assistance Modified independent, requires aide device or extra time  Assistive Device Front wheel walker;None  Distance Ambulated (ft) 360 ft  Mobility Ambulated with assistance in hallway;Ambulated independently in hallway  Mobility Response Tolerated well  Mobility performed by Mobility specialist  $Mobility charge 1 Mobility    Pt ambulated in hallway utilizing 6L for mobility, no LOB. Modified independence. Denied SOB. Did not use AD PTA, agreeable to trial last 60' without RW. Tolerated very well. No use of rails, no LOB. Steady with good speed. No complaints or reports of pain. Pt left in recliner with needs in reach. Returned to 4L prior to exit.   Kathee Delton Mobility Specialist 11/08/20, 4:10 PM

## 2020-11-08 NOTE — Progress Notes (Addendum)
PROGRESS NOTE    [place] Anna Charley  WUJ:[id number] DOB: 07-18-1945 DOA: 11/05/2020 PCP: Albina Billet, MD   Assessment & Plan:   Principal Problem:   GIST (gastrointestinal stromal tumor) of small bowel, malignant ([place]) Active Problems:   COPD (chronic obstructive pulmonary disease) (HCC)   Chronic respiratory failure with hypoxia (HCC)   OSA (obstructive sleep apnea)   Pulmonary hypertension (HCC)   Supplemental oxygen dependent   T2DM (type 2 diabetes mellitus) ([place])  GIST/gastrointestinal stromal tumor of small bowel: completed 6 months of Gleevec, underwent resection of GIST tumor/proximal small bowel. NG tube was clamped today as per GI.  Some gas and bowel movement today    Hypokalemia: KCl repleated. Mg is WNL   DM2: well controlled w/ HbA1c 6.3. Continue on glargine, SSI w/ accuchecks  HTN: continue to hold all anti-HTN meds while NPO    COPD & chronic respiratory failure: continue on bronchodilators and continue on supplemental oxygen    Pulmonary  HTN: hold home dose of opsumit, tadalfil while NPO  Chronic diastolic CHF: appears euvolemic. Continue to hold diuretics     DVT prophylaxis: lovenox  Code Status: full  Family Communication:  Disposition Plan: depends on PT/OT recs (not consulted yet)   Level of care: Med-Surg Status is: Inpatient  Remains inpatient appropriate because:Ongoing diagnostic testing needed not appropriate for outpatient work up, Unsafe d/c plan, IV treatments appropriate due to intensity of illness or inability to take PO, and Inpatient level of care appropriate due to severity of illness; still has NG tube   Dispo: The patient is from: Home              Anticipated d/c is to: Home              Patient currently is not medically stable to d/c. Still have NG tube   Difficult to place patient : unclear    Consultants:  Hospitalist   Procedures:   Antimicrobials:  Subjective: Pt c/o fatigue   Objective: Vitals:    11/07/20 1553 11/07/20 2001 11/08/20 0514 11/08/20 0730  BP: (!) 114/55 (!) 123/55 (!) 118/53 (!) 117/52  Pulse: 97 88 95 75  Resp: _0 Temp: 98.7 F (37.1 C) 99.4 F (37.4 C) 99.7 F (37.6 C) 99.1 F (37.3 C)  TempSrc: Oral Oral Oral Oral  SpO2: 93% 96% 96% 94%  Weight:      Height:        Intake/Output Summary (Last 24 hours) at 11/08/2020 0735 Last data filed at 11/08/2020 0500 Gross per 24 hour  Intake 590.94 ml  Output 225 ml  Net 365.94 ml   Filed Weights   11/05/20 0725  Weight: 88.5 kg    Examination:  General exam: Appears comfortable Respiratory system: clear breath sounds b/l  Cardiovascular system: S1/S2+. No rubs or clicks  Gastrointestinal system: Abd is soft, NT, ND & normal bowel sounds  Central nervous system: Alert and oriented. Moves all extremities  Psychiatry: Judgement and insight appear normal. Flat mood and affect    Data Reviewed: I have personally reviewed following labs and imaging studies  CBC: Recent Labs  Lab 11/05/20 1335 11/06/20 0530 11/07/20 0534 11/08/20 0512  WBC 9.6 11.2* 9.4 8.8  NEUTROABS 8.8*  --   --   --   HGB 11.2* 9.5* 9.3* 9.1*  HCT 34.7* 28.7* 28.7* 28.4*  MCV 92.0 92.9 92.6 94.0  PLT 238 204 207 956   Basic Metabolic Panel: Recent Labs  Lab 11/06/20 0530 11/07/20 0534 11/08/20 0512  NA 139 142 144  K 4.1 3.6 3.1*  CL 108 110 110  CO2 _0 GLUCOSE 230* 131* 146*  BUN _1 CREATININE 0.73 0.53 0.52  CALCIUM 8.1* 8.7* 8.5*  MG  --   --  2.2   GFR: Estimated Creatinine Clearance: 66.8 mL/min (by C-G formula based on SCr of 0.52 mg/dL). Liver Function Tests: No results for input(s): AST, ALT, ALKPHOS, BILITOT, PROT, ALBUMIN in the last 168 hours. No results for input(s): LIPASE, AMYLASE in the last 168 hours. No results for input(s): AMMONIA in the last 168 hours. Coagulation Profile: Recent Labs  Lab 11/01/20 0830  INR 1.0   Cardiac Enzymes: No results for input(s): CKTOTAL,  CKMB, CKMBINDEX, TROPONINI in the last 168 hours. BNP (last 3 results) No results for input(s): PROBNP in the last 8760 hours. HbA1C: Recent Labs    11/05/20 1335  HGBA1C 6.3*   CBG: Recent Labs  Lab 11/07/20 1157 11/07/20 1549 11/07/20 1959 11/07/20 2343 11/08/20 0402  GLUCAP 122* 78 84 111* 130*   Lipid Profile: No results for input(s): CHOL, HDL, LDLCALC, TRIG, CHOLHDL, LDLDIRECT in the last 72 hours. Thyroid Function Tests: No results for input(s): TSH, T4TOTAL, FREET4, T3FREE, THYROIDAB in the last 72 hours. Anemia Panel: No results for input(s): VITAMINB12, FOLATE, FERRITIN, TIBC, IRON, RETICCTPCT in the last 72 hours. Sepsis Labs: No results for input(s): PROCALCITON, LATICACIDVEN in the last 168 hours.  Recent Results (from the past 240 hour(s))  SARS CORONAVIRUS 2 (TAT 6-24 HRS) Nasopharyngeal Nasopharyngeal Swab     Status: None   Collection Time: 11/01/20  8:44 AM   Specimen: Nasopharyngeal Swab  Result Value Ref Range Status   SARS Coronavirus 2 NEGATIVE NEGATIVE Final    Comment: (NOTE) SARS-CoV-2 target nucleic acids are NOT DETECTED.  The SARS-CoV-2 RNA is generally detectable in upper and lower respiratory specimens during the acute phase of infection. Negative results do not preclude SARS-CoV-2 infection, do not rule out co-infections with other pathogens, and should not be used as the sole basis for treatment or other patient management decisions. Negative results must be combined with clinical observations, patient history, and epidemiological information. The expected result is Negative.  Fact Sheet for Patients: SugarRoll.be  Fact Sheet for Healthcare Providers: https://www.woods-mathews.com/  This test is not yet approved or cleared by the [place] FDA and  has been authorized for detection and/or diagnosis of SARS-CoV-2 by FDA under an Emergency Use Authorization (EUA). This EUA will remain  in  effect (meaning this test can be used) for the duration of the COVID-19 declaration under Se ction 564(b)(1) of the Act, 21 U.S.C. section 360bbb-3(b)(1), unless the authorization is terminated or revoked sooner.  Performed at [facility], [place] [address] [place], [place] [address]          Radiology Studies: No results found.      Scheduled Meds:  enoxaparin (LOVENOX) injection  40 mg Subcutaneous Q24H   insulin aspart  0-15 Units Subcutaneous Q4H   insulin glargine-yfgn  10 Units Subcutaneous Daily   latanoprost  1 drop Both Eyes QHS   mometasone-formoterol  2 puff Inhalation BID   pantoprazole (PROTONIX) IV  40 mg Intravenous QHS   timolol  1 drop Both Eyes BID   umeclidinium bromide  1 puff Inhalation Daily   Continuous Infusions:  dextrose 5 % and 0.9% NaCl 75 mL/hr at 11/08/20 0456  LOS: 3 days    Time spent: 30 mins    Wyvonnia Dusky, MD [facility] Pager [phone]  If 7PM-7AM, please contact night-coverage 11/08/2020, 7:35 AM

## 2020-11-09 ENCOUNTER — Telehealth: Payer: Self-pay | Admitting: Internal Medicine

## 2020-11-09 DIAGNOSIS — C49A3 Gastrointestinal stromal tumor of small intestine: Secondary | ICD-10-CM | POA: Diagnosis not present

## 2020-11-09 DIAGNOSIS — E1169 Type 2 diabetes mellitus with other specified complication: Secondary | ICD-10-CM | POA: Diagnosis not present

## 2020-11-09 DIAGNOSIS — I272 Pulmonary hypertension, unspecified: Secondary | ICD-10-CM | POA: Diagnosis not present

## 2020-11-09 LAB — CBC
HCT: 26.9 % — ABNORMAL LOW (ref 36.0–46.0)
Hemoglobin: 8.6 g/dL — ABNORMAL LOW (ref 12.0–15.0)
MCH: 30.3 pg (ref 26.0–34.0)
MCHC: 32 g/dL (ref 30.0–36.0)
MCV: 94.7 fL (ref 80.0–100.0)
Platelets: 200 10*3/uL (ref 150–400)
RBC: 2.84 MIL/uL — ABNORMAL LOW (ref 3.87–5.11)
RDW: 16 % — ABNORMAL HIGH (ref 11.5–15.5)
WBC: 7.5 10*3/uL (ref 4.0–10.5)
nRBC: 0 % (ref 0.0–0.2)

## 2020-11-09 LAB — BASIC METABOLIC PANEL
Anion gap: 3 — ABNORMAL LOW (ref 5–15)
BUN: 13 mg/dL (ref 8–23)
CO2: 26 mmol/L (ref 22–32)
Calcium: 8.2 mg/dL — ABNORMAL LOW (ref 8.9–10.3)
Chloride: 113 mmol/L — ABNORMAL HIGH (ref 98–111)
Creatinine, Ser: 0.59 mg/dL (ref 0.44–1.00)
GFR, Estimated: >60 mL/min (ref >60)
Glucose, Bld: 154 mg/dL — ABNORMAL HIGH (ref 70–99)
Potassium: 3.9 mmol/L (ref 3.5–5.1)
Sodium: 142 mmol/L (ref 135–145)

## 2020-11-09 LAB — HEPATIC FUNCTION PANEL
ALT: 13 U/L (ref 0–44)
AST: 16 U/L (ref 15–41)
Albumin: 2.4 g/dL — ABNORMAL LOW (ref 3.5–5.0)
Alkaline Phosphatase: 44 U/L (ref 38–126)
Bilirubin, Direct: 0.1 mg/dL (ref 0.0–0.2)
Indirect Bilirubin: 0.6 mg/dL (ref 0.3–0.9)
Total Bilirubin: 0.7 mg/dL (ref 0.3–1.2)
Total Protein: 5.4 g/dL — ABNORMAL LOW (ref 6.5–8.1)

## 2020-11-09 LAB — GLUCOSE, CAPILLARY
Glucose-Capillary: 108 mg/dL — ABNORMAL HIGH (ref 70–99)
Glucose-Capillary: 151 mg/dL — ABNORMAL HIGH (ref 70–99)
Glucose-Capillary: 152 mg/dL — ABNORMAL HIGH (ref 70–99)
Glucose-Capillary: 155 mg/dL — ABNORMAL HIGH (ref 70–99)
Glucose-Capillary: 188 mg/dL — ABNORMAL HIGH (ref 70–99)
Glucose-Capillary: 208 mg/dL — ABNORMAL HIGH (ref 70–99)

## 2020-11-09 LAB — MAGNESIUM: Magnesium: 2.1 mg/dL (ref 1.7–2.4)

## 2020-11-09 MED ORDER — TADALAFIL 20 MG PO TABS
20.0000 mg | ORAL_TABLET | Freq: Every day | ORAL | Status: DC
  Administered 2020-11-09 – 2020-11-10 (×2): 20 mg via ORAL
  Filled 2020-11-09 (×2): qty 1

## 2020-11-09 MED ORDER — MACITENTAN 10 MG PO TABS: 10.0000 mg | ORAL_TABLET | Freq: Every day | ORAL | Status: DC

## 2020-11-09 MED ORDER — MACITENTAN 10 MG PO TABS
10.0000 mg | ORAL_TABLET | Freq: Every day | ORAL | Status: DC
  Administered 2020-11-09 – 2020-11-10 (×2): 10 mg via ORAL
  Filled 2020-11-09 (×2): qty 1

## 2020-11-09 NOTE — Progress Notes (Addendum)
Mobility Specialist - Progress Note   11/09/20 1300  Mobility  Activity Ambulated in hall  Range of Motion/Exercises All extremities  Level of Assistance Independent after set-up  Assistive Device None  Distance Ambulated (ft) 220 ft  Mobility Ambulated independently in hallway  Mobility Response Tolerated well  Mobility performed by Mobility specialist  $Mobility charge 1 Mobility    Pt ambulated in hallway without AD, no LOB. 4L O2 on arrival bumped to 6L for mobility. Modified independence. Upon return to room, pt showered with set-up assist. No dizziness/nausea/fatigue. No pain. No complaints. Independent in peri-care. MinA to doff/don slip-resistant socks. Pt returned to recliner, lunch arriving and placed in reach. Family at bedside. Returned to [employer] 11/09/20, 1:19 PM

## 2020-11-09 NOTE — Plan of Care (Signed)
Continuing with plan of care.

## 2020-11-09 NOTE — Progress Notes (Signed)
11/09/20 1544  Clinical Encounter Type  Visited With Patient and family together  Visit Type Initial  Referral From Family  Consult/Referral To [place] was stopped in the hallway by Ms. Ivin Booty of [facility] who stated her mother was in the hospital. I immediately asked if she was ok and what's going on. Ms. Ivin Booty stated her mother had surgery. She said it's cancer, but she is doing ok. Please go visit her. I said, "I sure will I am on my way right now. I visited with Ms. [place] and another family member was in the room sitting next to her bed. I provided a ministry of presence, emotional and spiritual support.

## 2020-11-09 NOTE — Progress Notes (Signed)
Pharmacy Consult for Pulmonary Hypertension Treatment   Indication - Continuation of prior to admission medication   Patient is 75 y.o.  with history of PAH on chronic Macitentan (Opsumit) PTA and will be continued while hospitalized.   Continuing this medication order as an inpatient requires that monitoring parameters per REMS requirements must be met.  Chronic therapy is under the supervision of Dr. Loralie Champagne who is enrolled in the REMS program and is being notified of continuation of therapy. A staff message in EPIC has been sent notifying the certified prescriber.  Per patient report has previously been educated on Hepatotoxicity . On admission pregnancy risk has been assessed and no monitoring required. Hepatic function has been evaluated. AST/ALT appropriate to continue medication at this time.  Hepatic Function Latest Ref Rng & Units 11/09/2020 10/19/2020 09/19/2020  Total Protein 6.5 - 8.1 g/dL 5.4(L) 7.4 7.2  Albumin 3.5 - 5.0 g/dL 2.4(L) 3.8 3.7  AST 15 - 41 U/L _0 ALT 0 - 44 U/L _1 Alk Phosphatase 38 - 126 U/L 44 50 49  Total Bilirubin 0.3 - 1.2 mg/dL 0.7 0.6 0.3  Bilirubin, Direct 0.0 - 0.2 mg/dL 0.1 - -    If any question arise or pregnancy is identified during hospitalization, contact for bosentan and macitentan: [phone]; ambrisentan: [phone].  Thank for you allowing [name] to participate in the care of this patient.    Dorothe Pea, PharmD, BCPS Clinical Pharmacist

## 2020-11-09 NOTE — Progress Notes (Signed)
Afebrile, vital signs stable.  For the first time she is aware of some mild discomfort at the surgical incision.  She tolerated clear liquids well.  No difficulty with bowel movements.  Incentive spirometer at 1250 cc.  Lungs: Clear  Cardio: Regular rhythm.  Abdomen: Soft, nontender.  Extremities: Soft, no evidence of swelling.  Wound: Clean, dressing removed without incident.  Case reviewed with Dr. Rogue Bussing.  No indication for extended Lovenox use.  hypokalemia of yesterday improved.  Mild fallen hemoglobin, likely due to IV fluids, no evidence of GI or intra-abdominal bleeding.  Diet advanced to soft.  Plan for discharge tomorrow.

## 2020-11-09 NOTE — Plan of Care (Signed)
  Problem: Clinical Measurements: Goal: Ability to maintain clinical measurements within normal limits will improve Outcome: Progressing Goal: Will remain free from infection Outcome: Progressing Goal: Diagnostic test results will improve Outcome: Progressing Goal: Respiratory complications will improve Outcome: Progressing Goal: Cardiovascular complication will be avoided Outcome: Progressing   Problem: Elimination: Goal: Will not experience complications related to bowel motility Outcome: Progressing   Problem: Pain Managment: Goal: General experience of comfort will improve Outcome: Progressing   Pt is involved in and agrees with the plan of care. V/S stable. On chronic oxygen at 4lpm/[place] with sats at 97%. Dyspnea on exertion noted. No complaints of pain. Dressing on abdomen intact. Voiding well. BM noted this shift.

## 2020-11-09 NOTE — Progress Notes (Addendum)
PROGRESS NOTE    Susan Barajas  YQM:[id number] DOB: [birth year] DOA: 11/05/2020 PCP: Albina Billet, MD   Assessment & Plan:   Principal Problem:   GIST (gastrointestinal stromal tumor) of small bowel, malignant ([place]) Active Problems:   COPD (chronic obstructive pulmonary disease) (HCC)   Chronic respiratory failure with hypoxia (HCC)   OSA (obstructive sleep apnea)   Pulmonary hypertension (HCC)   Supplemental oxygen dependent   T2DM (type 2 diabetes mellitus) ([place])  GIST: completed 6 months of Gleevec, underwent resection of GIST tumor/proximal small bowel. NG tube was removed and pt has a bowel movement   Hypokalemia: WNL today  DM2: well controlled w/ HbA1c 6.3. Continue on glargine, SSI w/ accuchecks while inpatient and can go back on home anti-DM meds at d/c  HTN: will restart home dose of lasix tomorrow or at d/c     COPD & chronic respiratory failure: continue on bronchodilators and continue on supplemental oxygen    Pulmonary  HTN: will restart home dose opsumit, tadalfil   Chronic diastolic CHF: appears euvolemic. Continue to hold diuretics & restart tomorrow or at d/c  Normocytic anemia: w/ likely component of acute blood loss anemia secondary to above surg. No indication for a transfusion currently     DVT prophylaxis: lovenox  Code Status: full  Family Communication:  Disposition Plan: as per primary surgery team   Level of care: Med-Surg Status is: Inpatient  Remains inpatient appropriate because:Ongoing diagnostic testing needed not appropriate for outpatient work up, Unsafe d/c plan, IV treatments appropriate due to intensity of illness or inability to take PO, and Inpatient level of care appropriate due to severity of illness  Dispo: The patient is from: Home              Anticipated d/c is to: as per primary surgical team               Patient currently : stable. D/c as per primary team   Difficult to place patient : unclear  Hospitalist  service will sign off today. Please call/page w/ any concerns   Consultants:  Hospitalist   Procedures:   Antimicrobials:  Subjective: Pt c/o malaise   Objective: Vitals:   11/08/20 0730 11/08/20 1522 11/08/20 1941 11/09/20 0417  BP: (!) 117/52 (!) 107/48 (!) 102/54 (!) 105/52  Pulse: 75 70 (!) 58 65  Resp: _0 Temp: 99.1 F (37.3 C) 98.9 F (37.2 C) 98.3 F (36.8 C) 98.7 F (37.1 C)  TempSrc: Oral Oral Oral Oral  SpO2: 94% 97% 98% 97%  Weight:      Height:        Intake/Output Summary (Last 24 hours) at 11/09/2020 0741 Last data filed at 11/08/2020 1547 Gross per 24 hour  Intake 987.98 ml  Output --  Net 987.98 ml   Filed Weights   11/05/20 0725  Weight: 88.5 kg    Examination:  General exam: Appears calm & comfortable  Respiratory system: clear breath sounds b/l  Cardiovascular system: S1 & S2+. No rubs or clicks  Gastrointestinal system: Abd is soft, NT, ND & hypoactive bowel sounds  Central nervous system: Alert and oriented. Moves all extremities  Psychiatry: Judgement and insight appear normal. Flat mood and affect    Data Reviewed: I have personally reviewed following labs and imaging studies  CBC: Recent Labs  Lab 11/05/20 1335 11/06/20 0530 11/07/20 0534 11/08/20 0512 11/09/20 0438  WBC 9.6 11.2* 9.4 8.8 7.5  NEUTROABS 8.8*  --   --   --   --  HGB 11.2* 9.5* 9.3* 9.1* 8.6*  HCT 34.7* 28.7* 28.7* 28.4* 26.9*  MCV 92.0 92.9 92.6 94.0 94.7  PLT 238 204 207 207 041   Basic Metabolic Panel: Recent Labs  Lab 11/06/20 0530 11/07/20 0534 11/08/20 0512 11/09/20 0438  NA 139 142 144 142  K 4.1 3.6 3.1* 3.9  CL 108 110 110 113*  CO2 _0 GLUCOSE 230* 131* 146* 154*  BUN _1 CREATININE 0.73 0.53 0.52 0.59  CALCIUM 8.1* 8.7* 8.5* 8.2*  MG  --   --  2.2 2.1   GFR: Estimated Creatinine Clearance: 66.8 mL/min (by C-G formula based on SCr of 0.59 mg/dL). Liver Function Tests: No results for input(s): AST, ALT,  ALKPHOS, BILITOT, PROT, ALBUMIN in the last 168 hours. No results for input(s): LIPASE, AMYLASE in the last 168 hours. No results for input(s): AMMONIA in the last 168 hours. Coagulation Profile: No results for input(s): INR, PROTIME in the last 168 hours.  Cardiac Enzymes: No results for input(s): CKTOTAL, CKMB, CKMBINDEX, TROPONINI in the last 168 hours. BNP (last 3 results) No results for input(s): PROBNP in the last 8760 hours. HbA1C: No results for input(s): HGBA1C in the last 72 hours.  CBG: Recent Labs  Lab 11/08/20 1157 11/08/20 1615 11/08/20 1938 11/08/20 2340 11/09/20 0415  GLUCAP 126* 205* 202* 80 151*   Lipid Profile: No results for input(s): CHOL, HDL, LDLCALC, TRIG, CHOLHDL, LDLDIRECT in the last 72 hours. Thyroid Function Tests: No results for input(s): TSH, T4TOTAL, FREET4, T3FREE, THYROIDAB in the last 72 hours. Anemia Panel: No results for input(s): VITAMINB12, FOLATE, FERRITIN, TIBC, IRON, RETICCTPCT in the last 72 hours. Sepsis Labs: No results for input(s): PROCALCITON, LATICACIDVEN in the last 168 hours.  Recent Results (from the past 240 hour(s))  SARS CORONAVIRUS 2 (TAT 6-24 HRS) Nasopharyngeal Nasopharyngeal Swab     Status: None   Collection Time: 11/01/20  8:44 AM   Specimen: Nasopharyngeal Swab  Result Value Ref Range Status   SARS Coronavirus 2 NEGATIVE NEGATIVE Final    Comment: (NOTE) SARS-CoV-2 target nucleic acids are NOT DETECTED.  The SARS-CoV-2 RNA is generally detectable in upper and lower respiratory specimens during the acute phase of infection. Negative results do not preclude SARS-CoV-2 infection, do not rule out co-infections with other pathogens, and should not be used as the sole basis for treatment or other patient management decisions. Negative results must be combined with clinical observations, patient history, and epidemiological information. The expected result is Negative.  Fact Sheet for  Patients: SugarRoll.be  Fact Sheet for Healthcare Providers: https://www.woods-mathews.com/  This test is not yet approved or cleared by the [place] FDA and  has been authorized for detection and/or diagnosis of SARS-CoV-2 by FDA under an Emergency Use Authorization (EUA). This EUA will remain  in effect (meaning this test can be used) for the duration of the COVID-19 declaration under Se ction 564(b)(1) of the Act, 21 U.S.C. section 360bbb-3(b)(1), unless the authorization is terminated or revoked sooner.  Performed at [facility], [place] [address], [place], [place] [address]          Radiology Studies: No results found.      Scheduled Meds:  enoxaparin (LOVENOX) injection  40 mg Subcutaneous Q24H   insulin aspart  0-15 Units Subcutaneous Q4H   insulin glargine-yfgn  10 Units Subcutaneous Daily   latanoprost  1 drop Both Eyes QHS   mometasone-formoterol  2 puff Inhalation BID  pantoprazole (PROTONIX) IV  40 mg Intravenous QHS   timolol  1 drop Both Eyes BID   umeclidinium bromide  1 puff Inhalation Daily   Continuous Infusions:     LOS: 4 days    Time spent: 25 mins    Wyvonnia Dusky, MD [facility] Pager [phone]  If 7PM-7AM, please contact night-coverage 11/09/2020, 7:41 AM

## 2020-11-09 NOTE — Telephone Encounter (Signed)
On 7/28 & 7/29-I visited patient in the hospital after surgery.  Clinically doing well.  Discussed with Dr. Lulu Riding think is reasonable to hold off DVT prophylaxis given mild to moderate anemia.  Discussed with patient/daughter regarding ambulation; informing us of any swelling in the legs or sudden shortness of breath.  Recommend follow-up-as planned.  GB

## 2020-11-10 LAB — BASIC METABOLIC PANEL
Anion gap: 4 — ABNORMAL LOW (ref 5–15)
BUN: 14 mg/dL (ref 8–23)
CO2: 26 mmol/L (ref 22–32)
Calcium: 8.4 mg/dL — ABNORMAL LOW (ref 8.9–10.3)
Chloride: 110 mmol/L (ref 98–111)
Creatinine, Ser: 0.5 mg/dL (ref 0.44–1.00)
GFR, Estimated: >60 mL/min (ref >60)
Glucose, Bld: 99 mg/dL (ref 70–99)
Potassium: 3.8 mmol/L (ref 3.5–5.1)
Sodium: 140 mmol/L (ref 135–145)

## 2020-11-10 LAB — CBC
HCT: 26.9 % — ABNORMAL LOW (ref 36.0–46.0)
Hemoglobin: 8.4 g/dL — ABNORMAL LOW (ref 12.0–15.0)
MCH: 28.7 pg (ref 26.0–34.0)
MCHC: 31.2 g/dL (ref 30.0–36.0)
MCV: 91.8 fL (ref 80.0–100.0)
Platelets: 220 10*3/uL (ref 150–400)
RBC: 2.93 MIL/uL — ABNORMAL LOW (ref 3.87–5.11)
RDW: 15.4 % (ref 11.5–15.5)
WBC: 6.3 10*3/uL (ref 4.0–10.5)
nRBC: 0 % (ref 0.0–0.2)

## 2020-11-10 LAB — GLUCOSE, CAPILLARY
Glucose-Capillary: 109 mg/dL — ABNORMAL HIGH (ref 70–99)
Glucose-Capillary: 130 mg/dL — ABNORMAL HIGH (ref 70–99)

## 2020-11-10 LAB — MAGNESIUM: Magnesium: 2 mg/dL (ref 1.7–2.4)

## 2020-11-10 MED ORDER — FUROSEMIDE 20 MG PO TABS: 20.0000 mg | ORAL_TABLET | Freq: Every day | ORAL | Status: DC

## 2020-11-10 NOTE — Final Progress Note (Signed)
Tmax 99.4, BP low normal. Patient without symptoms. No SOB.  Using inspirex at 1250+. Tolerating diet. No pain. No analgesics since surgery. Still noting loose bowel movements after meals.  Anticipate this will resolve.    Lungs: Clear. Cardio: RR with occ PAC. ABD: Soft, non-distened, BS+. Wound: Clean and dry. Extrem: Soft.  Labs: HGB stable at 8.4 Patient without symptoms WBC: 6,300. K+: 3.8.  BUN: 14, creat 0.5.   Will decrease Lasix to 20 mg QAM at discharge. Recheck labs/ BP at follow up in four days.  She will resume regular diabetic meds.  Encouraged to have family member with her at home the first night.

## 2020-11-10 NOTE — Plan of Care (Signed)
Discharge teaching completed with patient who is in stable condition.

## 2020-11-13 ENCOUNTER — Other Ambulatory Visit: Payer: Self-pay | Admitting: Internal Medicine

## 2020-11-14 ENCOUNTER — Encounter: Payer: Self-pay | Admitting: General Surgery

## 2020-11-14 ENCOUNTER — Other Ambulatory Visit (HOSPITAL_COMMUNITY): Payer: Self-pay

## 2020-11-15 NOTE — Discharge Summary (Signed)
Physician Discharge Summary  Patient ID: Susan Barajas MRN: [id number] DOB/AGE: [birth year] 75 y.o.  Admit date: 11/05/2020 Discharge date: 11/15/2020  Admission Diagnoses: GIST tumor of proximal small bowel   Discharge Diagnoses:  Principal Problem:   GIST (gastrointestinal stromal tumor) of small bowel, malignant ([place]) Active Problems:   COPD (chronic obstructive pulmonary disease) (HCC)   Chronic respiratory failure with hypoxia (HCC)   OSA (obstructive sleep apnea)   Pulmonary hypertension (HCC)   Supplemental oxygen dependent   T2DM (type 2 diabetes mellitus) ([place])   Discharged Condition: good  Hospital Course: The patient was taken to the OR and underwent resection and primary anastomosis of the very proximal small bowel.  Significant blood loss with this vascular tumor.  Hemodynamically stability was never compromised.  She was aggressively ambulated.  NG drainage for 3 days and then able to be advanced from a liquid diet to a soft diet without incident. Mild blood pressure depression at discharge.  Clinically asymptomatic.  Lasix decreased by 60% for early follow-up in 3 days in the office. Consults: Hospitalist.  Significant Diagnostic Studies: Pathology: Low-grade gastrointestinal stromal tumor.  1 margin had tumor on ink.  Clinically stage R0 resection.  Treatments: IV hydration  Discharge Exam: Blood pressure (!) 110/55, pulse (!) 59, temperature 98.7 F (37.1 C), temperature source Oral, resp. rate 16, height _0  (1.651 m), weight 88.5 kg, SpO2 96 %. General appearance: alert and cooperative Resp: clear to auscultation bilaterally Cardio: regular rate and rhythm, S1, S2 normal, no murmur, click, rub or gallop GI: soft, non-tender; bowel sounds normal; no masses,  no organomegaly Incision/Wound: Incision healing well.  No induration or thickening.  Disposition: Discharge disposition: 01-Home or Self Care       Discharge Instructions     Diet - low  sodium heart healthy   Complete by: As directed    Discharge instructions   Complete by: As directed    Resume your regular diabetic diet and medications.  Your fluid pill is being reduced to Lasix, 20 mg in the morning.  No evening dose.  Will check your blood pressure and weight at your follow up visit on Tuesday at 10 AM with Dr. Bary Castilla.  Inhalers and Oxygen use as usual.  Walk as much as possible to improve your strength.  OK to shower with someone in the house to help.  Avoid large servings of raw fruits/ vegetables for the next week.  When getting up at night, take a minute to sit on the side of the bed to minimize the chance of a fall.   Increase activity slowly   Complete by: As directed    No wound care   Complete by: As directed       Allergies as of 11/10/2020   No Known Allergies      Medication List     STOP taking these medications    CALCIUM/VITAMIN D PO   imatinib 100 MG tablet Commonly known as: GLEEVEC   ondansetron 8 MG tablet Commonly known as: ZOFRAN       TAKE these medications    Accu-Chek Aviva Plus test strip Generic drug: glucose blood 1 each 2 (two) times daily.   Accu-Chek Softclix Lancets lancets USE 1 TO CHECK GLUCOSE TWICE DAILY   acetaminophen 325 MG tablet Commonly known as: TYLENOL Take 2 tablets (650 mg total) by mouth every 6 (six) hours as needed for mild pain (or Fever >/= 101).   aspirin 81 MG chewable tablet Chew  1 tablet (81 mg total) by mouth daily.   ferrous gluconate 325 MG tablet Commonly known as: FERGON Take 325 mg by mouth in the morning and at bedtime.   furosemide 20 MG tablet Commonly known as: LASIX Take 1 tablet (20 mg total) by mouth daily. Take 20 mg by mouth in the morning. What changed:  how much to take when to take this additional instructions   HumaLOG KwikPen 100 UNIT/ML KwikPen Generic drug: insulin lispro Inject 10 Units into the skin 2 (two) times daily.   insulin glargine 100  UNIT/ML injection Commonly known as: LANTUS Inject 32 Units into the skin in the morning.   latanoprost 0.005 % ophthalmic solution Commonly known as: XALATAN Place 1 drop into both eyes at bedtime.   lovastatin 20 MG tablet Commonly known as: MEVACOR Take 20 mg by mouth every evening.   metFORMIN 1000 MG tablet Commonly known as: GLUCOPHAGE Take 1,000 mg by mouth daily.   multivitamin tablet Take 1 tablet by mouth daily.   omeprazole 40 MG capsule Commonly known as: PRILOSEC Take 40 mg by mouth daily.   potassium chloride SA 20 MEQ tablet Commonly known as: KLOR-CON Take 1 tablet (20 mEq total) by mouth daily.   tadalafil (PAH) 20 MG tablet Commonly known as: Alyq Take 2 tablets (40 mg total) by mouth daily.   timolol 0.5 % ophthalmic solution Commonly known as: TIMOPTIC Place 1 drop into both eyes 2 (two) times daily.       ASK your doctor about these medications    dapagliflozin propanediol 10 MG Tabs tablet Commonly known as: Farxiga TAKE 1 TABLET BY MOUTH ONCE DAILY   Opsumit 10 MG tablet Generic drug: macitentan Take 1 tablet daily.   Stiolto Respimat 2.5-2.5 MCG/ACT Aers Generic drug: Tiotropium Bromide-Olodaterol INHALE 2 PUFFS BY MOUTH ONCE DAILY        Follow-up Information     Kemar Pandit, Forest Gleason, MD Follow up.   Specialties: General Surgery, Radiology Why: Tuesday, August 2 at 10 AM. Contact information: [place] [address] [phone]                 Signed: Robert Bellow 11/15/2020, 11:02 AM

## 2020-11-22 ENCOUNTER — Other Ambulatory Visit (HOSPITAL_COMMUNITY): Payer: Self-pay

## 2020-11-23 ENCOUNTER — Inpatient Hospital Stay: Payer: Medicare Other | Attending: Internal Medicine

## 2020-11-23 ENCOUNTER — Inpatient Hospital Stay: Payer: Medicare Other

## 2020-11-23 ENCOUNTER — Encounter: Payer: Self-pay | Admitting: Internal Medicine

## 2020-11-23 ENCOUNTER — Inpatient Hospital Stay: Payer: Medicare Other | Admitting: Internal Medicine

## 2020-11-23 VITALS — BP 120/71 | HR 60 | Temp 99.2°F | Resp 18 | Ht 65.0 in | Wt 193.6 lb

## 2020-11-23 DIAGNOSIS — M791 Myalgia, unspecified site: Secondary | ICD-10-CM | POA: Insufficient documentation

## 2020-11-23 DIAGNOSIS — I272 Pulmonary hypertension, unspecified: Secondary | ICD-10-CM | POA: Diagnosis not present

## 2020-11-23 DIAGNOSIS — I509 Heart failure, unspecified: Secondary | ICD-10-CM | POA: Diagnosis not present

## 2020-11-23 DIAGNOSIS — K219 Gastro-esophageal reflux disease without esophagitis: Secondary | ICD-10-CM | POA: Diagnosis not present

## 2020-11-23 DIAGNOSIS — Z79899 Other long term (current) drug therapy: Secondary | ICD-10-CM | POA: Insufficient documentation

## 2020-11-23 DIAGNOSIS — Z7982 Long term (current) use of aspirin: Secondary | ICD-10-CM | POA: Diagnosis not present

## 2020-11-23 DIAGNOSIS — I10 Essential (primary) hypertension: Secondary | ICD-10-CM | POA: Diagnosis not present

## 2020-11-23 DIAGNOSIS — E785 Hyperlipidemia, unspecified: Secondary | ICD-10-CM | POA: Diagnosis not present

## 2020-11-23 DIAGNOSIS — G4730 Sleep apnea, unspecified: Secondary | ICD-10-CM | POA: Diagnosis not present

## 2020-11-23 DIAGNOSIS — D509 Iron deficiency anemia, unspecified: Secondary | ICD-10-CM | POA: Diagnosis not present

## 2020-11-23 DIAGNOSIS — Z86718 Personal history of other venous thrombosis and embolism: Secondary | ICD-10-CM | POA: Diagnosis not present

## 2020-11-23 DIAGNOSIS — C49A3 Gastrointestinal stromal tumor of small intestine: Secondary | ICD-10-CM | POA: Diagnosis not present

## 2020-11-23 DIAGNOSIS — E611 Iron deficiency: Secondary | ICD-10-CM

## 2020-11-23 DIAGNOSIS — Z87891 Personal history of nicotine dependence: Secondary | ICD-10-CM | POA: Insufficient documentation

## 2020-11-23 DIAGNOSIS — J449 Chronic obstructive pulmonary disease, unspecified: Secondary | ICD-10-CM | POA: Insufficient documentation

## 2020-11-23 DIAGNOSIS — Z9981 Dependence on supplemental oxygen: Secondary | ICD-10-CM | POA: Insufficient documentation

## 2020-11-23 DIAGNOSIS — Z794 Long term (current) use of insulin: Secondary | ICD-10-CM | POA: Insufficient documentation

## 2020-11-23 DIAGNOSIS — E119 Type 2 diabetes mellitus without complications: Secondary | ICD-10-CM | POA: Insufficient documentation

## 2020-11-23 LAB — CBC WITH DIFFERENTIAL/PLATELET
Abs Immature Granulocytes: 0.01 10*3/uL (ref 0.00–0.07)
Basophils Absolute: 0 10*3/uL (ref 0.0–0.1)
Basophils Relative: 0 %
Eosinophils Absolute: 0 10*3/uL (ref 0.0–0.5)
Eosinophils Relative: 0 %
HCT: 31.6 % — ABNORMAL LOW (ref 36.0–46.0)
Hemoglobin: 10 g/dL — ABNORMAL LOW (ref 12.0–15.0)
Immature Granulocytes: 0 %
Lymphocytes Relative: 10 %
Lymphs Abs: 0.7 10*3/uL (ref 0.7–4.0)
MCH: 29.4 pg (ref 26.0–34.0)
MCHC: 31.6 g/dL (ref 30.0–36.0)
MCV: 92.9 fL (ref 80.0–100.0)
Monocytes Absolute: 0.5 10*3/uL (ref 0.1–1.0)
Monocytes Relative: 7 %
Neutro Abs: 5.6 10*3/uL (ref 1.7–7.7)
Neutrophils Relative %: 83 %
Platelets: 342 10*3/uL (ref 150–400)
RBC: 3.4 MIL/uL — ABNORMAL LOW (ref 3.87–5.11)
RDW: 16.2 % — ABNORMAL HIGH (ref 11.5–15.5)
WBC: 6.8 10*3/uL (ref 4.0–10.5)
nRBC: 0 % (ref 0.0–0.2)

## 2020-11-23 LAB — COMPREHENSIVE METABOLIC PANEL
ALT: 10 U/L (ref 0–44)
AST: 13 U/L — ABNORMAL LOW (ref 15–41)
Albumin: 3.4 g/dL — ABNORMAL LOW (ref 3.5–5.0)
Alkaline Phosphatase: 46 U/L (ref 38–126)
Anion gap: 10 (ref 5–15)
BUN: 14 mg/dL (ref 8–23)
CO2: 27 mmol/L (ref 22–32)
Calcium: 8.7 mg/dL — ABNORMAL LOW (ref 8.9–10.3)
Chloride: 103 mmol/L (ref 98–111)
Creatinine, Ser: 0.72 mg/dL (ref 0.44–1.00)
GFR, Estimated: >60 mL/min (ref >60)
Glucose, Bld: 106 mg/dL — ABNORMAL HIGH (ref 70–99)
Potassium: 3.5 mmol/L (ref 3.5–5.1)
Sodium: 140 mmol/L (ref 135–145)
Total Bilirubin: 0.7 mg/dL (ref 0.3–1.2)
Total Protein: 6.9 g/dL (ref 6.5–8.1)

## 2020-11-23 NOTE — Progress Notes (Signed)
Recent surgery on 7/25. Bowel resection, removal of Gist tumor and lymph nodes, umbilical hernia repair.

## 2020-11-23 NOTE — Assessment & Plan Note (Signed)
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  Question positive margins however discussed with Dr. Arlina Robes are clear.  I reviewed the pathology with the patient in detail.  #I had a long discussion with the patient regarding continued adjuvant therapy for total of 3 to 5 years.  Patient reluctant with versus long duration of adjuvant therapy [question myalgias/arthralgias].  Discussed with patient that if patient's tumor reoccurs-it would likely be a stage IV malignancy/which is incurable.  Hence importance of at least a 3 years of adjuvant therapy.  Patient agrees.  #CHF/COPD home O2 4-6Lits;-borderline respiratory status-STABLE.   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  Today hemoglobin is  10.1;  HOLD Venofer today-as patient is currently resected.  # Myalgias/arthralgias question Gleevec versus others- recommend ca+vit D; tylenol as needed.   # DISPOSITION:.   # HOLD venofer today # Follow up in 2 month-  MD; labs-cbc/cmp;iron studies/ferritin; possible venofer;Dr.B

## 2020-11-23 NOTE — Progress Notes (Signed)
[facility] NOTE  Patient Care Team: Albina Billet, MD as PCP - General (Unknown Physician Specialty) Wellington Hampshire, MD as PCP - Cardiology (Cardiology) Christene Lye, MD (General Surgery) Albina Billet, MD (Internal Medicine) Delana Meyer, Dolores Lory, MD (Vascular Surgery) Wilford Corner, MD as Consulting Physician (Gastroenterology) Cammie Sickle, MD as Consulting Physician (Hematology and Oncology)  CHIEF COMPLAINTS/PURPOSE OF CONSULTATION: GIST   HEMATOLOGY HISTORY  Oncology History Overview Note  NOV 2021- CT Bx- TUMOR  Tumor Site: Peritoneum, jejunal  Histologic Type: Gastrointestinal stromal tumor, spindle cell type  Mitotic Rate: 1 per 5 mm squared  Histologic Grade: G1, low grade (mitotic rate less than or equal to 5  per 5 mm squared)- STAGE T2N1 [possible mesneteric LN]; CT A/P-9.8 x9.8 cm  # NOV 29th 2021- GLEEVEC 400 mg/day neoadjuvant therapy 6 months.   SURGICAL PATHOLOGY  CASE: [id number]  PATIENT: Susan Barajas  Surgical Pathology Report      Specimen Submitted:  A. Lymph node, mesenteric  B. Lymph node, Proximal mesenteric  C. Small bowel with GIST   Clinical History: GIST small bowel       DIAGNOSIS:  A. LYMPH NODE, MESENTERIC; EXCISIONAL BIOPSY:  - REACTIVE LYMPHOID FOLLICULAR HYPERPLASIA WITH PROGRESSIVE  TRANSFORMATION OF GERMINAL CENTERS.  - NEGATIVE FOR MALIGNANCY, ONE LYMPH NODE (0/1).   B. LYMPH NODE, PROXIMAL MESENTERIC; EXCISIONAL BIOPSY:  - REACTIVE LYMPHOID FOLLICULAR HYPERPLASIA.  - NEGATIVE FOR MALIGNANCY, TWO LYMPH NODES (0/2).   C. SMALL BOWEL WITH GIST; RESECTION:  - GASTROINTESTINAL STROMAL TUMOR.  - SEE CANCER SUMMARY BELOW.   CASE SUMMARY: (GASTROINTESTINAL STROMAL TUMOR (GIST): Resection)  Standard(s): AJCC-UICC 8   CLINICAL  Pre-resection Treatment: Previous biopsy, systemic therapy performed  (Gleevec)   SPECIMEN  Procedure: Resection (partial small bowel)    TUMOR  Tumor Focality:  Unifocal  Tumor Site: Jejunum  Tumor Size: 9.7 x 9.2 x 6.3 cm  Histologic Type: Gastrointestinal stromal tumor, spindle cell type  Mitotic Rate:  Specify mitotic rate per 5 mm squared: Less than 5 per 5  mm squared  Histologic Grade: G1, low grade  Necrosis: Not identified  Treatment Effect: Present, approximately 30% viable tumor remains  Risk Assessment: Moderate (24%) risk of progressive disease   MARGINS:  Margin status:  Margin(s) involved by GIST: Deep, see comment.   Comment:  Tumor is present on ink on the periphery of the tumor nodule.  The  nature of the specimen precludes accurate assessment of where the tumor  may have been attached in situ (apart from the small bowel attachment).  Proximal and distal bowel margins cannot be reliably assessed, given  that they were received detached from the tumor specimen.   REGIONAL LYMPH NODES  Regional lymph node status:  Regional lymph nodes present, all regional lymph nodes negative for  tumor.  Number of lymph nodes examined: Three    # CHRONIC ANEMIA EGD/capsule-WNL-2018 [Dr.vanga]-UGIB ; colonoscopy-2018 [[facility]] ; capsule-not done; hemoglobin around 8.8; iron saturation 5% [Dr.Tate].  Poor tolerance of p.o. 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  GIST (gastrointestinal stromal tumor) of small bowel, malignant ([place])  03/12/2020 Initial Diagnosis   GIST (gastrointestinal stromal tumor) of small bowel, malignant (HCC)      HISTORY OF PRESENTING ILLNESS:  Susan Barajas 75 y.o.  female severe COPD  on 4 to 6 L of oxygen ; anemia of  iron deficiency; likely secondary to small bowel GIST on neoadjuvant Gleevec is here for follow-up.  In the interim patient underwent resection of the primary tumor; postoperative recovery uneventful.  Patient denies any nausea vomiting abdominal pain.  Denies any worsening fatigue.  Review of Systems  Constitutional:  Positive for malaise/fatigue. Negative for chills, diaphoresis, fever and weight loss.  HENT:  Negative for nosebleeds and sore throat.   Eyes:  Negative for double vision.  Respiratory:  Positive for shortness of breath. Negative for cough, hemoptysis, sputum production and wheezing.   Cardiovascular:  Negative for chest pain, palpitations, orthopnea and leg swelling.  Gastrointestinal:  Negative for abdominal pain, blood in stool, constipation, diarrhea, heartburn, melena, nausea and vomiting.  Genitourinary:  Negative for dysuria, frequency and urgency.  Musculoskeletal:  Negative for back pain and joint pain.  Skin: Negative.  Negative for itching and rash.  Neurological:  Negative for dizziness, tingling, focal weakness, weakness and headaches.  Endo/Heme/Allergies:  Does not bruise/bleed easily.  Psychiatric/Behavioral:  Negative for depression. The patient is not nervous/anxious and does not have insomnia.    MEDICAL HISTORY:  Past Medical History:  Diagnosis Date   Anemia    Asthma 1950   CHF (congestive heart failure) (HCC)    COPD (chronic obstructive pulmonary disease) (HCC)    Cor pulmonale (HCC)    DVT (deep venous thrombosis) ([place]) 2018   GERD (gastroesophageal reflux disease)    GIST (gastrointestinal stromal tumor) of small bowel, malignant (HCC)    Glaucoma 1949   Hyperlipidemia    Hypertension    Obesity, unspecified    OSA treated with BiPAP    Personal history of tobacco use, presenting hazards to health    Pneumonia    Presence of IVC filter    Pulmonary HTN (HCC)    Supplemental oxygen dependent    T2DM  (type 2 diabetes mellitus) ([place])    Varicose veins of lower extremities with other complications     SURGICAL HISTORY: Past Surgical History:  Procedure Laterality Date   BREAST BIOPSY Right 1991   BREAST BIOPSY Left 2013   COLONOSCOPY  2010   Dr. Vira Agar   COLONOSCOPY  Jan 2016   Dr Vira Agar   ESOPHAGOGASTRODUODENOSCOPY N/A 11/26/2016   Procedure: ESOPHAGOGASTRODUODENOSCOPY (EGD);  Surgeon: Lin Landsman, MD;  Location[facility] ENDOSCOPY;  Service: Gastroenterology;  Laterality: N/A;   GIVENS CAPSULE STUDY N/A 11/26/2016   Procedure: GIVENS CAPSULE STUDY, if EGD negative, plan to drop capsule with scope if EGD negative;  Surgeon: Lin Landsman, MD;  Location: [facility] ENDOSCOPY;  Service: Gastroenterology;  Laterality: N/A;   IVC FILTER INSERTION N/A 11/14/2016   Procedure: IVC Filter Insertion;  Surgeon: Katha Cabal, MD;  Location: [facility] CV LAB;  Service: Cardiovascular;  Laterality: N/A;   PARTIAL COLECTOMY N/A 11/05/2020   Procedure: SMALL BOWEL RESECTION, REMOVAL OF GIST TUMOR AND LYMPH NODES, UMBILICAL HERNIA REPAIR;  Surgeon: Robert Bellow, MD;  Location: [facility] ORS;  Service: General;  Laterality: N/A;  small bowel resection, RNFA to assist; TAPP block per anesthesia   RIGHT/LEFT HEART CATH AND CORONARY ANGIOGRAPHY N/A 06/30/2019   Procedure: RIGHT/LEFT HEART CATH AND CORONARY ANGIOGRAPHY;  Surgeon: Larey Dresser, MD;  Location: [facility];  Service: Cardiovascular;  Laterality: N/A;   VEIN SURGERY Right 2006   Vein Closure Procedure; RF ablation of right GSV    SOCIAL HISTORY: Social History   Socioeconomic History   Marital status: Single    Spouse name: Not on file   Number of children: 2   Years of education: Not on file   Highest education level: Not on file  Occupational History   Occupation: CELL    Employer: RETIRED  Tobacco Use   Smoking status: Former    Packs/day: 1.50    Years: 20.00    Pack years: 30.00    Types: Cigarettes     Quit date: 1999    Years since quitting: 23.6   Smokeless tobacco: Never  Vaping Use   Vaping Use: Never used  Substance and Sexual Activity   Alcohol use: No    Alcohol/week: 0.0 standard drinks   Drug use: No   Sexual activity: Not Currently  Other Topics Concern   Not on file  Social History Narrative   Independent at baseline. Lives at home with her brother; in [place]. Quit smoking 20 years; no alcohol.    Social Determinants of Health   Financial Resource Strain: Not on file  Food Insecurity: Not on file  Transportation Needs: Not on file  Physical Activity: Not on file  Stress: Not on file  Social Connections: Not on file  Intimate Partner Violence: Not on file    FAMILY HISTORY: Family History  Problem Relation Age of Onset   Prostate cancer Father    Other Sister        mouth cancer   Hypertension Mother     ALLERGIES:  has No Known Allergies.  MEDICATIONS:  Current Outpatient Medications  Medication Sig Dispense Refill   ACCU-CHEK AVIVA PLUS test strip 1 each 2 (two) times daily.     Accu-Chek Softclix Lancets lancets USE 1 TO CHECK GLUCOSE TWICE DAILY     acetaminophen (TYLENOL) 325 MG tablet Take 2 tablets (650 mg total) by mouth every 6 (six) hours as needed for mild pain (or Fever >/= 101).     aspirin 81 MG chewable tablet Chew 1 tablet (81 mg total) by mouth daily. 30 tablet 0   dapagliflozin propanediol (FARXIGA) 10 MG TABS tablet TAKE 1 TABLET BY MOUTH ONCE DAILY (Patient taking differently: Take 10 mg by mouth daily.) 90 tablet 3   ferrous gluconate (FERGON) 325 MG tablet Take 325 mg by mouth in the morning and at bedtime.      furosemide (LASIX) 20 MG tablet Take 1 tablet (20 mg total) by mouth daily. Take 20 mg by mouth in the morning. 30 tablet    HUMALOG KWIKPEN 100 UNIT/ML KiwkPen Inject 10 Units into the skin 2 (two) times daily.      insulin glargine (LANTUS) 100 UNIT/ML injection Inject 32 Units into the skin in the morning.      latanoprost (XALATAN) 0.005 % ophthalmic solution Place 1 drop into both eyes at bedtime.      lovastatin (MEVACOR) 20 MG tablet Take 20 mg by mouth every evening.      metFORMIN (GLUCOPHAGE) 1000 MG tablet Take 1,000 mg by mouth daily.      Multiple Vitamin (MULTIVITAMIN) tablet Take 1 tablet by mouth daily.     omeprazole (PRILOSEC) 40 MG capsule Take 40 mg by mouth daily.     OPSUMIT 10 MG tablet Take  1 tablet daily. (Patient taking differently: Take 10 mg by mouth daily.) 30 tablet 11   potassium chloride SA (KLOR-CON) 20 MEQ tablet Take 1 tablet (20 mEq total) by mouth daily. 90 tablet 3   STIOLTO RESPIMAT 2.5-2.5 MCG/ACT AERS INHALE 2 PUFFS BY MOUTH ONCE DAILY (Patient taking differently: Inhale 2 puffs into the lungs daily.) 4 g 5   tadalafil, PAH, (ALYQ) 20 MG tablet Take 2 tablets (40 mg total) by mouth daily. 60 tablet 11   timolol (TIMOPTIC) 0.5 % ophthalmic solution Place 1 drop into both eyes 2 (two) times daily.      No current facility-administered medications for this visit.      PHYSICAL EXAMINATION:   Vitals:   11/23/20 1314  BP: 120/71  Pulse: 60  Resp: 18  Temp: 99.2 F (37.3 C)  SpO2: 96%   Filed Weights   11/23/20 1314  Weight: 193 lb 9.6 oz (87.8 kg)    Physical Exam Constitutional:      Comments: 6 L of oxygen nasal cannula.  Walk independently. She is alone.  HENT:     Head: Normocephalic and atraumatic.     Mouth/Throat:     Pharynx: No oropharyngeal exudate.  Eyes:     Pupils: Pupils are equal, round, and reactive to light.  Cardiovascular:     Rate and Rhythm: Normal rate and regular rhythm.  Pulmonary:     Effort: No respiratory distress.     Breath sounds: No wheezing.     Comments: Decreased breath sounds. Abdominal:     General: Bowel sounds are normal. There is no distension.     Palpations: Abdomen is soft. There is no mass.     Tenderness: There is no abdominal tenderness. There is no guarding or rebound.  Musculoskeletal:         General: No tenderness. Normal range of motion.     Cervical back: Normal range of motion and neck supple.  Skin:    General: Skin is warm.  Neurological:     Mental Status: She is alert and oriented to person, place, and time.  Psychiatric:        Mood and Affect: Affect normal.    LABORATORY DATA:  I have reviewed the data as listed Lab Results  Component Value Date   WBC 6.8 11/23/2020   HGB 10.0 (L) 11/23/2020   HCT 31.6 (L) 11/23/2020   MCV 92.9 11/23/2020   PLT 342 11/23/2020   Recent Labs    10/19/20 1250 11/06/20 0530 11/09/20 0438 11/10/20 0428 11/23/20 1301  NA 135   < > 142 140 140  K 4.0   < > 3.9 3.8 3.5  CL 104   < > 113* 110 103  CO2 25   < > _0 GLUCOSE 82   < > 154* 99 106*  BUN 21   < > _1 CREATININE 0.74   < > 0.59 0.50 0.72  CALCIUM 8.7*   < > 8.2* 8.4* 8.7*  GFRNONAA >60   < > >60 >60 >60  PROT 7.4  --  5.4*  --  6.9  ALBUMIN 3.8  --  2.4*  --  3.4*  AST 18  --  16  --  13*  ALT 13  --  13  --  10  ALKPHOS 50  --  44  --  46  BILITOT 0.6  --  0.7  --  0.7  BILIDIR  --   --  0.1  --   --   IBILI  --   --  0.6  --   --    < > = values in this interval not displayed.     [name] OR NERVE BLOCK-IMAGE ONLY [facility])  Result Date: 11/05/2020 There is no interpretation for this exam.  This order is for images obtained during a surgical procedure.  Please See "Surgeries" Tab for more information regarding the procedure.     GIST (gastrointestinal stromal tumor) of small bowel, malignant ([place]) # GIST of small bowel-low grade STAGE II [T3N0]-s/p neoadjuvant therapy with Gleevec x6 months [November-end of June 2022]-s/p resection ~30% viable tumor noted post resection specimen.  Question positive margins however discussed with Dr. Arlina Robes are clear.  I reviewed the pathology with the patient in detail.  #I had a long discussion with the patient regarding continued adjuvant therapy for total of 3 to 5 years.  Patient reluctant with versus  long duration of adjuvant therapy [question myalgias/arthralgias].  Discussed with patient that if patient's tumor reoccurs-it would likely be a stage IV malignancy/which is incurable.  Hence importance of at least a 3 years of adjuvant therapy.  Patient agrees.  #CHF/COPD home O2 4-6Lits;-borderline respiratory status-STABLE.   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  Today hemoglobin is  10.1;  HOLD Venofer today-as patient is currently resected.  # Myalgias/arthralgias question Gleevec versus others- recommend ca+vit D; tylenol as needed.   # DISPOSITION:.   # HOLD venofer today # Follow up in 2 month-  MD; labs-cbc/cmp;iron studies/ferritin; possible venofer;Dr.B   All questions were answered. The patient knows to call the clinic with any problems, questions or concerns.    Cammie Sickle, MD 11/23/2020 2:06 PM

## 2020-12-06 ENCOUNTER — Encounter (HOSPITAL_COMMUNITY): Payer: Self-pay | Admitting: Cardiology

## 2020-12-06 ENCOUNTER — Ambulatory Visit (HOSPITAL_COMMUNITY)
Admission: RE | Admit: 2020-12-06 | Discharge: 2020-12-06 | Disposition: A | Discharge status: Home / Self Care | Payer: Medicare Other | Source: Ambulatory Visit | Attending: Cardiology | Admitting: Cardiology

## 2020-12-06 ENCOUNTER — Other Ambulatory Visit (HOSPITAL_COMMUNITY): Payer: Self-pay

## 2020-12-06 ENCOUNTER — Other Ambulatory Visit: Payer: Self-pay

## 2020-12-06 VITALS — BP 138/60 | HR 59 | Wt 191.6 lb

## 2020-12-06 DIAGNOSIS — Z794 Long term (current) use of insulin: Secondary | ICD-10-CM | POA: Diagnosis not present

## 2020-12-06 DIAGNOSIS — Z8616 Personal history of COVID-19: Secondary | ICD-10-CM | POA: Insufficient documentation

## 2020-12-06 DIAGNOSIS — I110 Hypertensive heart disease with heart failure: Secondary | ICD-10-CM | POA: Insufficient documentation

## 2020-12-06 DIAGNOSIS — I272 Pulmonary hypertension, unspecified: Secondary | ICD-10-CM

## 2020-12-06 DIAGNOSIS — Z79899 Other long term (current) drug therapy: Secondary | ICD-10-CM | POA: Insufficient documentation

## 2020-12-06 DIAGNOSIS — I2721 Secondary pulmonary arterial hypertension: Secondary | ICD-10-CM | POA: Insufficient documentation

## 2020-12-06 DIAGNOSIS — Z87891 Personal history of nicotine dependence: Secondary | ICD-10-CM | POA: Diagnosis not present

## 2020-12-06 DIAGNOSIS — Z86718 Personal history of other venous thrombosis and embolism: Secondary | ICD-10-CM | POA: Diagnosis not present

## 2020-12-06 DIAGNOSIS — E119 Type 2 diabetes mellitus without complications: Secondary | ICD-10-CM | POA: Diagnosis not present

## 2020-12-06 DIAGNOSIS — J9611 Chronic respiratory failure with hypoxia: Secondary | ICD-10-CM | POA: Insufficient documentation

## 2020-12-06 DIAGNOSIS — Z7982 Long term (current) use of aspirin: Secondary | ICD-10-CM | POA: Insufficient documentation

## 2020-12-06 DIAGNOSIS — J439 Emphysema, unspecified: Secondary | ICD-10-CM | POA: Insufficient documentation

## 2020-12-06 DIAGNOSIS — Z8249 Family history of ischemic heart disease and other diseases of the circulatory system: Secondary | ICD-10-CM | POA: Diagnosis not present

## 2020-12-06 DIAGNOSIS — I50810 Right heart failure, unspecified: Secondary | ICD-10-CM

## 2020-12-06 LAB — BRAIN NATRIURETIC PEPTIDE: B Natriuretic Peptide: 59.6 pg/mL (ref 0.0–100.0)

## 2020-12-06 NOTE — Progress Notes (Signed)
PCP: Albina Billet, MD Cardiology: Dr. Fletcher Anon HF Cardiology: Dr. Aundra Dubin  75 y.o. with history of chronic hypoxemic respiratory failure, OSA, and chronic diastolic CHF was referred by Dr. Fletcher Anon for evaluation of CHF and pulmonary hypertension.  Patient has been on home oxygen for 2-3 years per her report.  She was a smoker remotely. She had a DVT in 2018.  Anticoagulation was stopped due to refractory anemia and IVC filter was placed.  CT chest did not show a PE. She has OSA and uses CPAP.  Last echo in 1/21 showed normal LV EF with PASP 80 and severely dilated RV with mild RV dysfunction and D-shaped septum. She has been on Lasix for diuresis due to RV failure.    RHC/LHC was done in 3/21, showing mild CAD; mean RA 12, PA 68/25 mean 42, mean PCWP 9, CI 4.1, PVR 4.13 WU.  V/Q scan in 3/21 did not show evidence for chronic PE.  High resolution chest CT in 3/21 did not show evidence for ILD, it did show mild-moderate emphysema. PFTs (3/21) showed moderate-severe COPD.   She has been diagnosed with GI stromal tumor and is now on [place].   Echo 5/22 showed EF 60-65%, low normal RV function, mild RV enlargement, PASP 44 mmHg.  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   She returns for followup of pulmonary hypertension and RV failure.  Wearing home oxygen, 4L at rest and 6L with exertion.  Weight is down 10 lbs.  Breathing ok while walking on flat ground with her oxygen.  No chest pain or lightheadedness.  Short of breath without oxygen.        6 minute walk (3/21): 182 m 6 minute walk (5/21): 243 m, oxygen saturation dropped to as low as 70s 6 minute walk (7/21): Only walked 3 min bc oxygen saturation dropped to 70s and CMA stopped walk, she went 121 m.  6 minute walk (3/22): 298 m  Labs (12/20): BNP 819 Labs (2/21): K 4.4, creatinine 0.72 Labs (3/21): RF normal, ANA negative, anti-Jo-1 negative, anti-SCL-70 negative Labs (5/21): K 4.3, creatinine 0.88 Labs (6/21): hgb 10 Labs (3/22):  BNP 89 Labs (4/22): K 3.9, creatinine 0.93 Labs (8/22): K 3.5, creatinine 0.72  PMH: 1. COVID-19 PNA 8/20.  2. Type 2 diabetes 3. DVT: right leg, 2018.  - Has IVC filter, not anticoagulated due to h/o anemia.  4. HTN 5. Hyperlipidemia.  6. OSA: Uses Bipap.  7. COPD: Chronic hypoxemic respiratory failure, 3 L home oxygen.  8. Hyponatremia on HCTZ 9. Chronic diastolic CHF:  - Lexiscan Cardiolite in 2/19 was low risk no ischemia.  - Echo (1/21): EF 65-70%, no LVH, trivial MR, PASP 80 mmHg, severe RV enlargement with mildly decreased RV systolic function, D-shaped interventricular septum, dilated IVC.  - CTA chest (8/18): No evidence for PE.  10. Pulmonary hypertension: RHC/LHC 3/21 with mild CAD; mean RA 12, PA 68/25 mean 42, mean PCWP 9, CI 4.1, PVR 4.13 WU.  - Serologic workup negative.  - V/Q scan (3/21): No chronic PE.  - HRCT (3/21): No ILD, mild-moderate COPD. - PFTs (3/21): moderate-severe COPD - Echo (5/22): EF 60-65%, low normal RV function, mild RV enlargement, PASP 44 mmHg 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  Social History   Socioeconomic History   Marital status: Single    Spouse name: Not on file   Number of children: 2   Years of education: Not on file  Highest education level: Not on file  Occupational History   Occupation: CELL    Employer: RETIRED  Tobacco Use   Smoking status: Former    Packs/day: 1.50    Years: 20.00    Pack years: 30.00    Types: Cigarettes    Quit date: 1999    Years since quitting: 23.6   Smokeless tobacco: Never  Vaping Use   Vaping Use: Never used  Substance and Sexual Activity   Alcohol use: No    Alcohol/week: 0.0 standard drinks   Drug use: No   Sexual activity: Not Currently  Other Topics Concern   Not on file  Social History Narrative   Independent at baseline. Lives at home with her brother; in [place]. Quit smoking 20 years; no alcohol.    Social Determinants of  Health   Financial Resource Strain: Not on file  Food Insecurity: Not on file  Transportation Needs: Not on file  Physical Activity: Not on file  Stress: Not on file  Social Connections: Not on file  Intimate Partner Violence: Not on file   Family History  Problem Relation Age of Onset   Prostate cancer Father    Other Sister        mouth cancer   Hypertension Mother    ROS: All systems reviewed and negative except as per HPI.   Current Outpatient Medications  Medication Sig Dispense Refill   ACCU-CHEK AVIVA PLUS test strip 1 each 2 (two) times daily.     Accu-Chek Softclix Lancets lancets USE 1 TO CHECK GLUCOSE TWICE DAILY     acetaminophen (TYLENOL) 325 MG tablet Take 2 tablets (650 mg total) by mouth every 6 (six) hours as needed for mild pain (or Fever >/= 101).     aspirin 81 MG chewable tablet Chew 1 tablet (81 mg total) by mouth daily. 30 tablet 0   dapagliflozin propanediol (FARXIGA) 10 MG TABS tablet TAKE 1 TABLET BY MOUTH ONCE DAILY 90 tablet 3   ferrous gluconate (FERGON) 325 MG tablet Take 325 mg by mouth in the morning and at bedtime.      furosemide (LASIX) 20 MG tablet Take 1 tablet (20 mg total) by mouth daily. Take 20 mg by mouth in the morning. 30 tablet    HUMALOG KWIKPEN 100 UNIT/ML KiwkPen Inject 10 Units into the skin 2 (two) times daily.      insulin glargine (LANTUS) 100 UNIT/ML injection Inject 32 Units into the skin in the morning.     latanoprost (XALATAN) 0.005 % ophthalmic solution Place 1 drop into both eyes at bedtime.      lovastatin (MEVACOR) 20 MG tablet Take 20 mg by mouth every evening.      metFORMIN (GLUCOPHAGE) 1000 MG tablet Take 1,000 mg by mouth daily.      Multiple Vitamin (MULTIVITAMIN) tablet Take 1 tablet by mouth daily.     omeprazole (PRILOSEC) 40 MG capsule Take 40 mg by mouth daily.     OPSUMIT 10 MG tablet Take 1 tablet daily. 30 tablet 11   potassium chloride SA (KLOR-CON) 20 MEQ tablet Take 1 tablet (20 mEq total) by mouth  daily. 90 tablet 3   STIOLTO RESPIMAT 2.5-2.5 MCG/ACT AERS INHALE 2 PUFFS BY MOUTH ONCE DAILY 4 g 5   tadalafil, PAH, (ALYQ) 20 MG tablet Take 2 tablets (40 mg total) by mouth daily. 60 tablet 11   timolol (TIMOPTIC) 0.5 % ophthalmic solution Place 1 drop into both eyes 2 (two) times daily.  No current facility-administered medications for this encounter.   BP 138/60   Pulse (!) 59   Wt 86.9 kg (191 lb 9.6 oz)   SpO2 99%   BMI 31.88 kg/m  General: NAD Neck: JVP 7-8 cm, no thyromegaly or thyroid nodule.  Lungs: Clear to auscultation bilaterally with normal respiratory effort. CV: Nondisplaced PMI.  Heart regular S1/S2, no S3/S4, no murmur.  1+ ankle edema.  No carotid bruit.  Normal pedal pulses.  Abdomen: Soft, nontender, no hepatosplenomegaly, no distention.  Skin: Intact without lesions or rashes.  Neurologic: Alert and oriented x 3.  Psych: Normal affect. Extremities: No clubbing or cyanosis.  HEENT: Normal.   Assessment/Plan: 1. RV failure: Echo in 1/21 showed significant RV dysfunction with severe pulmonary hypertension and D-shaped interventricular septum.  Based on RHC, it looks like RV failure is due to pulmonary arterial hypertension rather than LV failure (normal PCWP).  Repeated echo in 5/22 showed EF 60-65%, low normal RV function, mild RV enlargement, PASP 44 mmHg. She is not significantly volume overloaded on exam today.   - Continue current Lasix.  Check BNP.   2. Pulmonary HTN: Echo in 1/21 with RV failure and estimate PA systolic pressure 80 mmHg.  RHC in 3/21 showed pulmonary arterial hypertension.  Serologic workup was negative.  V/Q scan showed no chronic PE.  HRCT showed no ILD, mild-moderate emphysema.  PFTs suggestive of moderate COPD. Pulmonary hypertension seems out of proportion to parenchymal disease, suspect significant group 1 component to her [place] in addition to group 3 (COPD).  She has been symptomatically improved with selective pulmonary vasodilators.    - Continue to use Bipap at night, oxygen during the day.  - Continue Opsumit 10 mg daily.  - Continue tadalafil 40 mg daily.  - Given improvement with Opsumit and tadalafil, I will add Tyvaso DPI (inhaled so may limit V/Q mismatch in the setting of some degree of pulmonary parenchymal disease).  Pharmacy to start working on this.  - Check BNP today.   3. H/o DVT: 2018.  She has IVC filter and is not anticoagulated due to h/o anemia.  4. Type 2 diabetes: She is now on Farxiga.  5. Chronic hypoxemic respiratory failure: Home oxygen.  Moderate COPD on PFTs, mild-moderate emphysema on HRCT.  Suspect pulmonary arterial hypertension also plays a role.  - Continue home oxygen.  Followup in 3 months, will get 6 minute walk at that time when she is on Tyvaso.    Loralie Champagne 12/06/2020

## 2020-12-06 NOTE — Patient Instructions (Signed)
Labs done today. We will contact you only if your labs are abnormal.  Our Pharmacist will be in contact with you regarding starting Tyvaso.   No other medication changes were made. Please continue all current medications as prescribed.  Your physician recommends that you schedule a follow-up appointment in: 3 months  If you have any questions or concerns before your next appointment please send [name] a message through [place] or call our office at [phone].    TO LEAVE A MESSAGE FOR THE NURSE SELECT OPTION 2, PLEASE LEAVE A MESSAGE INCLUDING: YOUR NAME DATE OF BIRTH CALL BACK NUMBER REASON FOR CALL**this is important as we prioritize the call backs  YOU WILL RECEIVE A CALL BACK THE SAME DAY AS LONG AS YOU CALL BEFORE 4:00 PM   Do the following things EVERYDAY: Weigh yourself in the morning before breakfast. Write it down and keep it in a log. Take your medicines as prescribed Eat low salt foods--Limit salt (sodium) to 2000 mg per day.  Stay as active as you can everyday Limit all fluids for the day to less than 2 liters   At the [facility], you and your health needs are our priority. As part of our continuing mission to provide you with exceptional heart care, we have created designated Provider Care Teams. These Care Teams include your primary Cardiologist (physician) and Advanced Practice Providers (APPs- Physician Assistants and Nurse Practitioners) who all work together to provide you with the care you need, when you need it.   You may see any of the following providers on your designated Care Team at your next follow up: Dr Glori Bickers Dr Haynes Kerns, NP Lyda Jester, [place] Audry Riles, PharmD   Please be sure to bring in all your medications bottles to every appointment.

## 2020-12-11 ENCOUNTER — Telehealth (HOSPITAL_COMMUNITY): Payer: Self-pay | Admitting: Pharmacist

## 2020-12-11 NOTE — Telephone Encounter (Signed)
Sent in Tyvaso DPI enrollment application to [place].    Application pending, will continue to follow.   Audry Riles, PharmD, BCPS, BCCP, CPP Heart Failure Clinic Pharmacist [phone]

## 2020-12-20 ENCOUNTER — Other Ambulatory Visit (HOSPITAL_COMMUNITY): Payer: Self-pay

## 2020-12-24 NOTE — Telephone Encounter (Signed)
Patient Advocate Encounter   Received notification from [employer] that prior authorization for Tyvaso DPI is required.   PA submitted on CoverMyMeds Key [id number] Status is pending   Will continue to follow.   Audry Riles, PharmD, BCPS, BCCP, CPP Heart Failure Clinic Pharmacist [phone]

## 2020-12-25 ENCOUNTER — Other Ambulatory Visit (HOSPITAL_COMMUNITY): Payer: Self-pay

## 2020-12-25 ENCOUNTER — Other Ambulatory Visit: Payer: Self-pay | Admitting: Internal Medicine

## 2020-12-25 DIAGNOSIS — C49A3 Gastrointestinal stromal tumor of small intestine: Secondary | ICD-10-CM

## 2020-12-25 MED ORDER — IMATINIB MESYLATE 100 MG PO TABS
ORAL_TABLET | ORAL | 1 refills | Status: DC
  Filled 2020-12-25: qty 120, 30d supply, fill #0
  Filled 2021-01-30: qty 120, 30d supply, fill #1

## 2020-12-25 NOTE — Telephone Encounter (Signed)
Advanced Heart Failure Patient Advocate Encounter  Sent additional clinical information via efax to, [phone].  Will follow up.

## 2020-12-26 ENCOUNTER — Other Ambulatory Visit (HOSPITAL_COMMUNITY): Payer: Self-pay

## 2020-12-26 NOTE — Telephone Encounter (Signed)
Advanced Heart Failure Patient Advocate Encounter  Received message from [place]. G. Klang 1945/08/26 has been cleared for the Tyvaso DPI therapy. Nursing and pharmacy will work with the patient to get start of care set up.   Audry Riles, PharmD, BCPS, BCCP, CPP Heart Failure Clinic Pharmacist [phone]

## 2020-12-26 NOTE — Telephone Encounter (Signed)
Advanced Heart Failure Patient Advocate Encounter  Prior Authorization for Tyvaso DPI has been approved.    PA# [id number] Effective dates: 12/25/20 through 04/13/21  Approval sent to [facility]

## 2021-01-14 ENCOUNTER — Telehealth (HOSPITAL_COMMUNITY): Payer: Self-pay | Admitting: *Deleted

## 2021-01-14 NOTE — Telephone Encounter (Signed)
Pt said she can not tolerate medication and will stop it.

## 2021-01-14 NOTE — Telephone Encounter (Signed)
Would decrease back to lowest dose.  If she cannot tolerate it, stop.

## 2021-01-14 NOTE — Telephone Encounter (Signed)
Pt called stating that since starting tyvaso she has been vomiting, had no appetite, no energy. She is contacting the nurse that helps titrate her tyvaso as well. Pt said she has not increased dose they were supposed to titrate up this week but she can not tolerate medication. Pt asked that I report this to Dr.McLean.

## 2021-01-18 ENCOUNTER — Other Ambulatory Visit (HOSPITAL_COMMUNITY): Payer: Self-pay

## 2021-01-25 ENCOUNTER — Inpatient Hospital Stay (HOSPITAL_BASED_OUTPATIENT_CLINIC_OR_DEPARTMENT_OTHER): Payer: Medicare Other | Admitting: Internal Medicine

## 2021-01-25 ENCOUNTER — Other Ambulatory Visit: Payer: Self-pay

## 2021-01-25 ENCOUNTER — Encounter: Payer: Self-pay | Admitting: Internal Medicine

## 2021-01-25 ENCOUNTER — Inpatient Hospital Stay: Payer: Medicare Other

## 2021-01-25 ENCOUNTER — Inpatient Hospital Stay: Payer: Medicare Other | Attending: Internal Medicine

## 2021-01-25 ENCOUNTER — Other Ambulatory Visit: Payer: Self-pay | Admitting: *Deleted

## 2021-01-25 DIAGNOSIS — M7989 Other specified soft tissue disorders: Secondary | ICD-10-CM | POA: Insufficient documentation

## 2021-01-25 DIAGNOSIS — R112 Nausea with vomiting, unspecified: Secondary | ICD-10-CM | POA: Diagnosis not present

## 2021-01-25 DIAGNOSIS — J449 Chronic obstructive pulmonary disease, unspecified: Secondary | ICD-10-CM | POA: Diagnosis not present

## 2021-01-25 DIAGNOSIS — C49A3 Gastrointestinal stromal tumor of small intestine: Secondary | ICD-10-CM

## 2021-01-25 DIAGNOSIS — G4733 Obstructive sleep apnea (adult) (pediatric): Secondary | ICD-10-CM | POA: Diagnosis not present

## 2021-01-25 DIAGNOSIS — D509 Iron deficiency anemia, unspecified: Secondary | ICD-10-CM | POA: Insufficient documentation

## 2021-01-25 DIAGNOSIS — E119 Type 2 diabetes mellitus without complications: Secondary | ICD-10-CM | POA: Diagnosis not present

## 2021-01-25 DIAGNOSIS — E611 Iron deficiency: Secondary | ICD-10-CM

## 2021-01-25 DIAGNOSIS — Z86718 Personal history of other venous thrombosis and embolism: Secondary | ICD-10-CM | POA: Insufficient documentation

## 2021-01-25 DIAGNOSIS — I272 Pulmonary hypertension, unspecified: Secondary | ICD-10-CM | POA: Insufficient documentation

## 2021-01-25 DIAGNOSIS — Z9981 Dependence on supplemental oxygen: Secondary | ICD-10-CM | POA: Diagnosis not present

## 2021-01-25 DIAGNOSIS — Z7982 Long term (current) use of aspirin: Secondary | ICD-10-CM | POA: Diagnosis not present

## 2021-01-25 DIAGNOSIS — Z794 Long term (current) use of insulin: Secondary | ICD-10-CM | POA: Insufficient documentation

## 2021-01-25 DIAGNOSIS — I110 Hypertensive heart disease with heart failure: Secondary | ICD-10-CM | POA: Insufficient documentation

## 2021-01-25 DIAGNOSIS — Z79899 Other long term (current) drug therapy: Secondary | ICD-10-CM | POA: Diagnosis not present

## 2021-01-25 DIAGNOSIS — E785 Hyperlipidemia, unspecified: Secondary | ICD-10-CM | POA: Insufficient documentation

## 2021-01-25 DIAGNOSIS — Z8042 Family history of malignant neoplasm of prostate: Secondary | ICD-10-CM | POA: Diagnosis not present

## 2021-01-25 DIAGNOSIS — M797 Fibromyalgia: Secondary | ICD-10-CM | POA: Diagnosis not present

## 2021-01-25 DIAGNOSIS — I509 Heart failure, unspecified: Secondary | ICD-10-CM | POA: Diagnosis not present

## 2021-01-25 DIAGNOSIS — K219 Gastro-esophageal reflux disease without esophagitis: Secondary | ICD-10-CM | POA: Diagnosis not present

## 2021-01-25 DIAGNOSIS — D649 Anemia, unspecified: Secondary | ICD-10-CM | POA: Diagnosis not present

## 2021-01-25 DIAGNOSIS — R197 Diarrhea, unspecified: Secondary | ICD-10-CM | POA: Diagnosis not present

## 2021-01-25 DIAGNOSIS — Z87891 Personal history of nicotine dependence: Secondary | ICD-10-CM | POA: Insufficient documentation

## 2021-01-25 LAB — CBC WITH DIFFERENTIAL/PLATELET
Abs Immature Granulocytes: 0.08 10*3/uL — ABNORMAL HIGH (ref 0.00–0.07)
Basophils Absolute: 0 10*3/uL (ref 0.0–0.1)
Basophils Relative: 0 %
Eosinophils Absolute: 0 10*3/uL (ref 0.0–0.5)
Eosinophils Relative: 0 %
HCT: 33.5 % — ABNORMAL LOW (ref 36.0–46.0)
Hemoglobin: 11.1 g/dL — ABNORMAL LOW (ref 12.0–15.0)
Immature Granulocytes: 1 %
Lymphocytes Relative: 8 %
Lymphs Abs: 0.8 10*3/uL (ref 0.7–4.0)
MCH: 29 pg (ref 26.0–34.0)
MCHC: 33.1 g/dL (ref 30.0–36.0)
MCV: 87.5 fL (ref 80.0–100.0)
Monocytes Absolute: 0.6 10*3/uL (ref 0.1–1.0)
Monocytes Relative: 6 %
Neutro Abs: 8.2 10*3/uL — ABNORMAL HIGH (ref 1.7–7.7)
Neutrophils Relative %: 85 %
Platelets: 268 10*3/uL (ref 150–400)
RBC: 3.83 MIL/uL — ABNORMAL LOW (ref 3.87–5.11)
RDW: 15.5 % (ref 11.5–15.5)
WBC: 9.8 10*3/uL (ref 4.0–10.5)
nRBC: 0 % (ref 0.0–0.2)

## 2021-01-25 LAB — COMPREHENSIVE METABOLIC PANEL
ALT: 15 U/L (ref 0–44)
AST: 18 U/L (ref 15–41)
Albumin: 2.9 g/dL — ABNORMAL LOW (ref 3.5–5.0)
Alkaline Phosphatase: 61 U/L (ref 38–126)
Anion gap: 5 (ref 5–15)
BUN: 19 mg/dL (ref 8–23)
CO2: 25 mmol/L (ref 22–32)
Calcium: 8.3 mg/dL — ABNORMAL LOW (ref 8.9–10.3)
Chloride: 103 mmol/L (ref 98–111)
Creatinine, Ser: 0.77 mg/dL (ref 0.44–1.00)
GFR, Estimated: >60 mL/min (ref >60)
Glucose, Bld: 144 mg/dL — ABNORMAL HIGH (ref 70–99)
Potassium: 3.8 mmol/L (ref 3.5–5.1)
Sodium: 133 mmol/L — ABNORMAL LOW (ref 135–145)
Total Bilirubin: 0.2 mg/dL — ABNORMAL LOW (ref 0.3–1.2)
Total Protein: 6.4 g/dL — ABNORMAL LOW (ref 6.5–8.1)

## 2021-01-25 LAB — IRON AND TIBC
Iron: 49 ug/dL (ref 28–170)
Saturation Ratios: 20 % (ref 10.4–31.8)
TIBC: 244 ug/dL — ABNORMAL LOW (ref 250–450)
UIBC: 195 ug/dL

## 2021-01-25 LAB — FERRITIN: Ferritin: 138 ng/mL (ref 11–307)

## 2021-01-25 NOTE — Progress Notes (Signed)
Patient here for follow up, VSS and WNL she has no concerns today.

## 2021-01-25 NOTE — Assessment & Plan Note (Signed)
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  Currently on adjuvant Gleevec [3-5 years].  #Tolerating Gleevec well except for mild side effects [diarrhea]; swelling in the feet [see below]  #Diarrhea-grade 1 from [facility]; monitor for now.  #Swelling in the feet-grade 1.;  Continue compression stockings/diuretics as needed.  #CHF/COPD home O2 4-6Lits;-borderline respiratory status-STABLE.   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  Today hemoglobin is  11.3  HOLD Venofer today-STABLE.  # Myalgias/arthralgias question Gleevec versus others- recommend ca+vit D; tylenol as needed.   # DISPOSITION:.   # HOLD venofer today # Follow up in 3  month-  MD; labs-cbc/cmp;iron studies/ferritin; possible venofer;Dr.B

## 2021-01-25 NOTE — Progress Notes (Signed)
[place] NOTE  Patient Care Team: Albina Billet, MD as PCP - General (Unknown Physician Specialty) Wellington Hampshire, MD as PCP - Cardiology (Cardiology) Christene Lye, MD (General Surgery) Albina Billet, MD (Internal Medicine) Delana Meyer, Dolores Lory, MD (Vascular Surgery) Wilford Corner, MD as Consulting Physician (Gastroenterology) Cammie Sickle, MD as Consulting Physician (Hematology and Oncology)  CHIEF COMPLAINTS/PURPOSE OF CONSULTATION: GIST   HEMATOLOGY HISTORY  Oncology History Overview Note  NOV 2021- CT Bx- TUMOR  Tumor Site: Peritoneum, jejunal  Histologic Type: Gastrointestinal stromal tumor, spindle cell type  Mitotic Rate: 1 per 5 mm squared  Histologic Grade: G1, low grade (mitotic rate less than or equal to 5  per 5 mm squared)- STAGE T2N1 [possible mesneteric LN]; CT A/P-9.8 x9.8 cm  # NOV 29th 2021- GLEEVEC 400 mg/day neoadjuvant therapy 6 months.   SURGICAL PATHOLOGY  CASE: [id number]  PATIENT: Susan Barajas  Surgical Pathology Report      Specimen Submitted:  A. Lymph node, mesenteric  B. Lymph node, Proximal mesenteric  C. Small bowel with GIST   Clinical History: GIST small bowel       DIAGNOSIS:  A. LYMPH NODE, MESENTERIC; EXCISIONAL BIOPSY:  - REACTIVE LYMPHOID FOLLICULAR HYPERPLASIA WITH PROGRESSIVE  TRANSFORMATION OF GERMINAL CENTERS.  - NEGATIVE FOR MALIGNANCY, ONE LYMPH NODE (0/1).   B. LYMPH NODE, PROXIMAL MESENTERIC; EXCISIONAL BIOPSY:  - REACTIVE LYMPHOID FOLLICULAR HYPERPLASIA.  - NEGATIVE FOR MALIGNANCY, TWO LYMPH NODES (0/2).   C. SMALL BOWEL WITH GIST; RESECTION:  - GASTROINTESTINAL STROMAL TUMOR.  - SEE CANCER SUMMARY BELOW.   CASE SUMMARY: (GASTROINTESTINAL STROMAL TUMOR (GIST): Resection)  Standard(s): AJCC-UICC 8   CLINICAL  Pre-resection Treatment: Previous biopsy, systemic therapy performed  (Gleevec)   SPECIMEN  Procedure: Resection (partial small bowel)    TUMOR  Tumor Focality:  Unifocal  Tumor Site: Jejunum  Tumor Size: 9.7 x 9.2 x 6.3 cm  Histologic Type: Gastrointestinal stromal tumor, spindle cell type  Mitotic Rate:  Specify mitotic rate per 5 mm squared: Less than 5 per 5  mm squared  Histologic Grade: G1, low grade  Necrosis: Not identified  Treatment Effect: Present, approximately 30% viable tumor remains  Risk Assessment: Moderate (24%) risk of progressive disease   MARGINS:  Margin status:  Margin(s) involved by GIST: Deep, see comment.   Comment:  Tumor is present on ink on the periphery of the tumor nodule.  The  nature of the specimen precludes accurate assessment of where the tumor  may have been attached in situ (apart from the small bowel attachment).  Proximal and distal bowel margins cannot be reliably assessed, given  that they were received detached from the tumor specimen.   REGIONAL LYMPH NODES  Regional lymph node status:  Regional lymph nodes present, all regional lymph nodes negative for  tumor.  Number of lymph nodes examined: Three    # CHRONIC ANEMIA EGD/capsule-WNL-2018 [Dr.vanga]-UGIB ; colonoscopy-2018 [[facility]] ; capsule-not done; hemoglobin around 8.8; iron saturation 5% [Dr.Tate].  Poor tolerance of p.o. 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  GIST (gastrointestinal stromal tumor) of small bowel, malignant ([place])  03/12/2020 Initial Diagnosis   GIST (gastrointestinal stromal tumor) of small bowel, malignant ([facility])      HISTORY OF PRESENTING ILLNESS: Alone; ambulating independently. 6 L of oxygen  nasal cannula.    Susan Barajas 75 y.o.  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  Patient has intermittent diarrhea maybe once every other week.  Complains of mild swelling in the legs.  Wears compression stockings.  Intermittently on diuretics.  Patient lost weight which she attributes to inhalers-new.  She states the inhalers cause her nausea vomiting.  Her symptoms are resolved since she stopped taking the inhalers.  Denies any nausea vomiting from the [place].  Review of Systems  Constitutional:  Positive for malaise/fatigue. Negative for chills, diaphoresis, fever and weight loss.  HENT:  Negative for nosebleeds and sore throat.   Eyes:  Negative for double vision.  Respiratory:  Positive for shortness of breath. Negative for cough, hemoptysis, sputum production and wheezing.   Cardiovascular:  Negative for chest pain, palpitations, orthopnea and leg swelling.  Gastrointestinal:  Negative for abdominal pain, blood in stool, constipation, diarrhea, heartburn, melena, nausea and vomiting.  Genitourinary:  Negative for dysuria, frequency and urgency.  Musculoskeletal:  Negative for back pain and joint pain.  Skin: Negative.  Negative for itching and rash.  Neurological:  Negative for dizziness, tingling, focal weakness, weakness and headaches.  Endo/Heme/Allergies:  Does not bruise/bleed easily.  Psychiatric/Behavioral:  Negative for depression. The patient is not nervous/anxious and does not have insomnia.    MEDICAL HISTORY:  Past Medical History:  Diagnosis Date   Anemia    Asthma 1950   CHF (congestive heart failure) (HCC)    COPD (chronic obstructive pulmonary disease) (HCC)    Cor pulmonale (HCC)    DVT (deep venous thrombosis) ([place]) 2018   GERD (gastroesophageal reflux disease)    GIST (gastrointestinal stromal tumor) of small bowel,  malignant (HCC)    Glaucoma 1949   Hyperlipidemia    Hypertension    Obesity, unspecified    OSA treated with BiPAP    Personal history of tobacco use, presenting hazards to health    Pneumonia    Presence of IVC filter    Pulmonary HTN (HCC)    Supplemental oxygen dependent    T2DM (type 2 diabetes mellitus) ([place])    Varicose veins of lower extremities with other complications     SURGICAL HISTORY: Past Surgical History:  Procedure Laterality Date   BREAST BIOPSY Right 1991   BREAST BIOPSY Left 2013   COLONOSCOPY  2010   Dr. Vira Agar   COLONOSCOPY  Jan 2016   Dr Vira Agar   ESOPHAGOGASTRODUODENOSCOPY N/A 11/26/2016   Procedure: ESOPHAGOGASTRODUODENOSCOPY (EGD);  Surgeon: Lin Landsman, MD;  Location: [facility] ENDOSCOPY;  Service: Gastroenterology;  Laterality: N/A;   GIVENS CAPSULE STUDY N/A 11/26/2016   Procedure: GIVENS CAPSULE STUDY, if EGD negative, plan to drop capsule with scope if EGD negative;  Surgeon: Lin Landsman, MD;  Location: [facility]COPY;  Service: Gastroenterology;  Laterality: N/A;   IVC FILTER INSERTION N/A 11/14/2016   Procedure: IVC Filter Insertion;  Surgeon: Katha Cabal, MD;  Location: [facility] CV LAB;  Service: Cardiovascular;  Laterality: N/A;   PARTIAL COLECTOMY N/A 11/05/2020   Procedure: SMALL BOWEL RESECTION, REMOVAL OF GIST TUMOR AND LYMPH  NODES, UMBILICAL HERNIA REPAIR;  Surgeon: Robert Bellow, MD;  Location: [facility] ORS;  Service: General;  Laterality: N/A;  small bowel resection, RNFA to assist; TAPP block per anesthesia   RIGHT/LEFT HEART CATH AND CORONARY ANGIOGRAPHY N/A 06/30/2019   Procedure: RIGHT/LEFT HEART CATH AND CORONARY ANGIOGRAPHY;  Surgeon: Larey Dresser, MD;  Location: [facility];  Service: Cardiovascular;  Laterality: N/A;   VEIN SURGERY Right 2006   Vein Closure Procedure; RF ablation of right GSV    SOCIAL HISTORY: Social History   Socioeconomic History   Marital status: Single    Spouse name: Not  on file   Number of children: 2   Years of education: Not on file   Highest education level: Not on file  Occupational History   Occupation: CELL    Employer: RETIRED  Tobacco Use   Smoking status: Former    Packs/day: 1.50    Years: 20.00    Pack years: 30.00    Types: Cigarettes    Quit date: 1999    Years since quitting: 23.8   Smokeless tobacco: Never  Vaping Use   Vaping Use: Never used  Substance and Sexual Activity   Alcohol use: No    Alcohol/week: 0.0 standard drinks   Drug use: No   Sexual activity: Not Currently  Other Topics Concern   Not on file  Social History Narrative   Independent at baseline. Lives at home with her brother; in [place]. Quit smoking 20 years; no alcohol.    Social Determinants of Health   Financial Resource Strain: Not on file  Food Insecurity: Not on file  Transportation Needs: Not on file  Physical Activity: Not on file  Stress: Not on file  Social Connections: Not on file  Intimate Partner Violence: Not on file    FAMILY HISTORY: Family History  Problem Relation Age of Onset   Prostate cancer Father    Other Sister        mouth cancer   Hypertension Mother     ALLERGIES:  has No Known Allergies.  MEDICATIONS:  Current Outpatient Medications  Medication Sig Dispense Refill   ACCU-CHEK AVIVA PLUS test strip 1 each 2 (two) times daily.     Accu-Chek Softclix Lancets lancets USE 1 TO CHECK GLUCOSE TWICE DAILY     acetaminophen (TYLENOL) 325 MG tablet Take 2 tablets (650 mg total) by mouth every 6 (six) hours as needed for mild pain (or Fever >/= 101).     aspirin 81 MG chewable tablet Chew 1 tablet (81 mg total) by mouth daily. 30 tablet 0   dapagliflozin propanediol (FARXIGA) 10 MG TABS tablet TAKE 1 TABLET BY MOUTH ONCE DAILY 90 tablet 3   ferrous gluconate (FERGON) 325 MG tablet Take 325 mg by mouth in the morning and at bedtime.      furosemide (LASIX) 20 MG tablet Take 1 tablet (20 mg total) by mouth daily. Take 20 mg  by mouth in the morning. 30 tablet    HUMALOG KWIKPEN 100 UNIT/ML KiwkPen Inject 10 Units into the skin 2 (two) times daily.      imatinib (GLEEVEC) 100 MG tablet TAKE 4 TABLETS (400 MG TOTAL) BY MOUTH DAILY. TAKE WITH MEALS AND LARGE GLASS OF WATER 120 tablet 1   insulin glargine (LANTUS) 100 UNIT/ML injection Inject 32 Units into the skin in the morning.     latanoprost (XALATAN) 0.005 % ophthalmic solution Place 1 drop into both eyes at bedtime.  lovastatin (MEVACOR) 20 MG tablet Take 20 mg by mouth every evening.      metFORMIN (GLUCOPHAGE) 1000 MG tablet Take 1,000 mg by mouth daily.      Multiple Vitamin (MULTIVITAMIN) tablet Take 1 tablet by mouth daily.     omeprazole (PRILOSEC) 40 MG capsule Take 40 mg by mouth daily.     OPSUMIT 10 MG tablet Take 1 tablet daily. 30 tablet 11   potassium chloride SA (KLOR-CON) 20 MEQ tablet Take 1 tablet (20 mEq total) by mouth daily. 90 tablet 3   STIOLTO RESPIMAT 2.5-2.5 MCG/ACT AERS INHALE 2 PUFFS BY MOUTH ONCE DAILY 4 g 5   tadalafil, PAH, (ALYQ) 20 MG tablet Take 2 tablets (40 mg total) by mouth daily. 60 tablet 11   timolol (TIMOPTIC) 0.5 % ophthalmic solution Place 1 drop into both eyes 2 (two) times daily.      No current facility-administered medications for this visit.      PHYSICAL EXAMINATION:   Vitals:   01/25/21 1058  BP: 130/64  Pulse: 69  Resp: 20  Temp: 98.9 F (37.2 C)  SpO2: 95%   Filed Weights   01/25/21 1058  Weight: 181 lb 9.6 oz (82.4 kg)    Physical Exam HENT:     Head: Normocephalic and atraumatic.     Mouth/Throat:     Pharynx: No oropharyngeal exudate.  Eyes:     Pupils: Pupils are equal, round, and reactive to light.  Cardiovascular:     Rate and Rhythm: Normal rate and regular rhythm.  Pulmonary:     Effort: No respiratory distress.     Breath sounds: No wheezing.     Comments: Decreased breath sounds. Abdominal:     General: Bowel sounds are normal. There is no distension.     Palpations:  Abdomen is soft. There is no mass.     Tenderness: There is no abdominal tenderness. There is no guarding or rebound.  Musculoskeletal:        General: No tenderness. Normal range of motion.     Cervical back: Normal range of motion and neck supple.  Skin:    General: Skin is warm.  Neurological:     Mental Status: She is alert and oriented to person, place, and time.  Psychiatric:        Mood and Affect: Affect normal.    LABORATORY DATA:  I have reviewed the data as listed Lab Results  Component Value Date   WBC 9.8 01/25/2021   HGB 11.1 (L) 01/25/2021   HCT 33.5 (L) 01/25/2021   MCV 87.5 01/25/2021   PLT 268 01/25/2021   Recent Labs    11/09/20 0438 11/10/20 0428 11/23/20 1301 01/25/21 1028  NA 142 140 140 133*  K 3.9 3.8 3.5 3.8  CL 113* 110 103 103  CO2 _0 GLUCOSE 154* 99 106* 144*  BUN _1 CREATININE 0.59 0.50 0.72 0.77  CALCIUM 8.2* 8.4* 8.7* 8.3*  GFRNONAA >60 >60 >60 >60  PROT 5.4*  --  6.9 6.4*  ALBUMIN 2.4*  --  3.4* 2.9*  AST 16  --  13* 18  ALT 13  --  10 15  ALKPHOS 44  --  46 61  BILITOT 0.7  --  0.7 0.2*  BILIDIR 0.1  --   --   --   IBILI 0.6  --   --   --      No results found.  GIST (gastrointestinal stromal tumor) of small bowel, malignant ([place]) # GIST of small bowel-low grade STAGE II [T3N0]-s/p neoadjuvant therapy with Gleevec x6 months [November-end of June 2022]-s/p resection ~30% viable tumor noted post resection specimen.  Currently on adjuvant Gleevec [3-5 years].  #Tolerating Gleevec well except for mild side effects [diarrhea]; swelling in the feet [see below]  #Diarrhea-grade 1 from [facility]; monitor for now.  #Swelling in the feet-grade 1.;  Continue compression stockings/diuretics as needed.  #CHF/COPD home O2 4-6Lits;-borderline respiratory status-STABLE.   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  Today hemoglobin is  11.3  HOLD Venofer today-STABLE.  #  Myalgias/arthralgias question Gleevec versus others- recommend ca+vit D; tylenol as needed.   # DISPOSITION:.   # HOLD venofer today # Follow up in 3  month-  MD; labs-cbc/cmp;iron studies/ferritin; possible venofer;Dr.B    All questions were answered. The patient knows to call the clinic with any problems, questions or concerns.    Cammie Sickle, MD 01/25/2021 11:31 AM

## 2021-01-30 ENCOUNTER — Other Ambulatory Visit (HOSPITAL_COMMUNITY): Payer: Self-pay

## 2021-02-08 ENCOUNTER — Other Ambulatory Visit: Payer: Self-pay | Admitting: Internal Medicine

## 2021-02-12 ENCOUNTER — Other Ambulatory Visit (HOSPITAL_COMMUNITY): Payer: Self-pay

## 2021-02-20 ENCOUNTER — Other Ambulatory Visit (HOSPITAL_COMMUNITY): Payer: Self-pay

## 2021-02-20 ENCOUNTER — Other Ambulatory Visit: Payer: Self-pay | Admitting: Internal Medicine

## 2021-02-20 DIAGNOSIS — C49A3 Gastrointestinal stromal tumor of small intestine: Secondary | ICD-10-CM

## 2021-02-20 MED ORDER — IMATINIB MESYLATE 100 MG PO TABS
ORAL_TABLET | ORAL | 1 refills | Status: DC
  Filled 2021-02-20: qty 120, 30d supply, fill #0
  Filled 2021-03-26: qty 120, 30d supply, fill #1

## 2021-02-26 ENCOUNTER — Other Ambulatory Visit (HOSPITAL_COMMUNITY): Payer: Self-pay

## 2021-02-26 ENCOUNTER — Encounter: Payer: Self-pay | Admitting: Internal Medicine

## 2021-02-26 ENCOUNTER — Ambulatory Visit: Payer: Medicare Other | Admitting: Internal Medicine

## 2021-02-26 ENCOUNTER — Other Ambulatory Visit: Payer: Self-pay

## 2021-02-26 VITALS — BP 114/64 | HR 92 | Temp 97.1°F | Ht 62.5 in | Wt 189.6 lb

## 2021-02-26 DIAGNOSIS — J9611 Chronic respiratory failure with hypoxia: Secondary | ICD-10-CM | POA: Diagnosis not present

## 2021-02-26 DIAGNOSIS — I272 Pulmonary hypertension, unspecified: Secondary | ICD-10-CM | POA: Diagnosis not present

## 2021-02-26 DIAGNOSIS — J449 Chronic obstructive pulmonary disease, unspecified: Secondary | ICD-10-CM | POA: Diagnosis not present

## 2021-02-26 DIAGNOSIS — G4733 Obstructive sleep apnea (adult) (pediatric): Secondary | ICD-10-CM

## 2021-02-26 NOTE — Patient Instructions (Addendum)
Excellent job keep up the great work!!!  Continue BiPAP as prescribed Continue oxygen as prescribed Continue inhalers as prescribed  Follow-up with cardiologist

## 2021-02-26 NOTE — Progress Notes (Signed)
_0  ID: [place] Anna Inskeep, female    DOB: [birth year], 75 y.o.   MRN: [id number]     SYNOPSIS 75 year old female former smoker followed for severe obstructive sleep apnea on nocturnal BiPAP and chronic respiratory failure on oxygen, COPD .  She has underlying pulmonary hypertension secondary to underlying sleep apnea, diastolic heart failure and cor pulmonale.  She is followed by cardiology and is on Opsumit and tadalafil COVID-19 infection with COVID-pneumonia in September 2020 Previous DVT in 2018 was on anticoagulation but had to be stopped due to refractory anemia with IVC filter placed  TEST/EVENTS :  RHC/LHC was done in 3/21, showing mild CAD; mean RA 12, PA 68/25 mean 42, mean PCWP 9, CI 4.1, PVR 4.13 WU.  V/Q scan in 3/21 did not show evidence for chronic PE.     High resolution chest CT in 3/21 did not show evidence for ILD, it did show mild-moderate emphysema.     pulmonary function testing in March 2021 showed moderate airflow obstruction with an FEV1 at 66%, ratio 56, FVC 91%, positive bronchodilator response.  Significant mid flow obstruction and reversibility.  DLCO 32%.  PET scan 09/2020 -Slight decrease in size of a left upper quadrant mass which demonstrates similar mild hypermetabolism. 2. A hypermetabolic ileocolic mesenteric node is slightly increased in size since the prior exam, suspicious metastatic disease. This could alternatively be reactive, given the appearance of the colon. 3. Chronic sigmoid wall thickening and diffuse colonic hypermetabolism in the setting of marked diverticulosis. Likely muscular hypertrophy and chronic diverticulitis. Correlate with any symptoms to suggest acute superimposed inflammation. Also correlate with colon cancer screening history to exclude underlying neoplasm.   CC  follow up COPD Follow-up OSA Follow-up chronic hypoxic respiratory failure  HPI Regarding COPD  O2 RF , OSA and Pulmonary Hypertension  Patient has  underlying moderate COPD.  She was placed on Stiolto August 2021.    Patient says that this has really helped her feels that the inhaler has help with her breathing.   She denies any significant shortness of breath.  She tries to stay somewhat active.     She denies any flare of her cough or wheezing.  Is had no recent antibiotic use or steroid use.  No increased albuterol use.  Regarding chronic hypoxic respiratory failure She has no shortness of breath at rest.  Gets winded with prolonged walking.  She is on oxygen 4 L at home.  This is on continuous flow.  When she is out away from home or doing activities with prolonged walking.  She uses her portable simply go and uses 6 pulsing oxygen. She denies any recent increased oxygen demands.  O2 saturations have maintained in the 90s  Regarding OSA Patient is on BiPAP at bedtime for underlying sleep apnea.  She says she cannot sleep without it.  She uses her BiPAP every single night.  Usually gets in about 8 hours.  BiPAP download shows excellent compliance with 100% usage.  Daily average usage at 7.5 hours.  Patient is on auto BiPAP IPAP at 18 cm H2O.  APAP at 13 cm H2O.  AHI is 1.2.   Regarding pulmonary hypertension She is followed by cardiology for pulmonary hypertension.  She remains on tadalafil and Opsumit.  Recent echo in May 2022 showed improved pulmonary artery pressures with PAP at 43 mmHg.  Patient says she does feel that her shortness of breath has improved since beginning these medications.  She denies any orthopnea or increased leg  swelling  Patient has been diagnosed recently with a gastrointestinal stromal tumor.  She has been followed by oncology.  Is currently on Gleevec.  She has upcoming abdominal surgery next week for tumor resection.  Status post resection July 2022 No postop pulmonary complications noted     No Known Allergies  Immunization History  Administered Date(s) Administered   Fluad Quad(high Dose 65+)  03/14/2019   Influenza Split 01/25/2018   Influenza-Unspecified 01/09/2020   Moderna Sars-Covid-2 Vaccination 05/27/2019, 06/27/2019   Pneumococcal Polysaccharide-23 04/13/2019    Past Medical History:  Diagnosis Date   Anemia    Asthma 1950   CHF (congestive heart failure) (HCC)    COPD (chronic obstructive pulmonary disease) (HCC)    Cor pulmonale (HCC)    DVT (deep venous thrombosis) ([place]) 2018   GERD (gastroesophageal reflux disease)    GIST (gastrointestinal stromal tumor) of small bowel, malignant (HCC)    Glaucoma 1949   Hyperlipidemia    Hypertension    Obesity, unspecified    OSA treated with BiPAP    Personal history of tobacco use, presenting hazards to health    Pneumonia    Presence of IVC filter    Pulmonary HTN (HCC)    Supplemental oxygen dependent    T2DM (type 2 diabetes mellitus) ([place])    Varicose veins of lower extremities with other complications     Tobacco History: Social History   Tobacco Use  Smoking Status Former   Packs/day: 1.50   Years: 20.00   Pack years: 30.00   Types: Cigarettes   Quit date: 1999   Years since quitting: 23.8  Smokeless Tobacco Never   Counseling given: Not Answered   Outpatient Medications Prior to Visit  Medication Sig Dispense Refill   ACCU-CHEK AVIVA PLUS test strip 1 each 2 (two) times daily.     Accu-Chek Softclix Lancets lancets USE 1 TO CHECK GLUCOSE TWICE DAILY     acetaminophen (TYLENOL) 325 MG tablet Take 2 tablets (650 mg total) by mouth every 6 (six) hours as needed for mild pain (or Fever >/= 101).     aspirin 81 MG chewable tablet Chew 1 tablet (81 mg total) by mouth daily. 30 tablet 0   dapagliflozin propanediol (FARXIGA) 10 MG TABS tablet TAKE 1 TABLET BY MOUTH ONCE DAILY 90 tablet 3   ferrous gluconate (FERGON) 325 MG tablet Take 325 mg by mouth in the morning and at bedtime.      furosemide (LASIX) 20 MG tablet Take 1 tablet (20 mg total) by mouth daily. Take 20 mg by mouth in the morning. 30  tablet    HUMALOG KWIKPEN 100 UNIT/ML KiwkPen Inject 10 Units into the skin 2 (two) times daily.      imatinib (GLEEVEC) 100 MG tablet TAKE 4 TABLETS (400 MG TOTAL) BY MOUTH DAILY. TAKE WITH MEALS AND LARGE GLASS OF WATER 120 tablet 1   insulin glargine (LANTUS) 100 UNIT/ML injection Inject 32 Units into the skin in the morning.     latanoprost (XALATAN) 0.005 % ophthalmic solution Place 1 drop into both eyes at bedtime.      lovastatin (MEVACOR) 20 MG tablet Take 20 mg by mouth every evening.      metFORMIN (GLUCOPHAGE) 1000 MG tablet Take 1,000 mg by mouth daily.      Multiple Vitamin (MULTIVITAMIN) tablet Take 1 tablet by mouth daily.     omeprazole (PRILOSEC) 40 MG capsule Take 40 mg by mouth daily.     OPSUMIT 10 MG  tablet Take 1 tablet daily. 30 tablet 11   potassium chloride SA (KLOR-CON) 20 MEQ tablet Take 1 tablet (20 mEq total) by mouth daily. 90 tablet 3   STIOLTO RESPIMAT 2.5-2.5 MCG/ACT AERS INHALE 2 PUFFS BY MOUTH ONCE DAILY 4 g 3   tadalafil, PAH, (ALYQ) 20 MG tablet Take 2 tablets (40 mg total) by mouth daily. 60 tablet 11   timolol (TIMOPTIC) 0.5 % ophthalmic solution Place 1 drop into both eyes 2 (two) times daily.      No facility-administered medications prior to visit.     Review of Systems:  Gen:  Denies  fever, sweats, chills weight loss  HEENT: Denies blurred vision, double vision, ear pain, eye pain, hearing loss, nose bleeds, sore throat Cardiac:  No dizziness, chest pain or heaviness, chest tightness,edema, No JVD Resp:   No cough, -sputum production, +shortness of breath,-wheezing, -hemoptysis,  Other:  All other systems negative    BP 114/64 (BP Location: Left Arm, Patient Position: Sitting, Cuff Size: Normal)   Pulse 92   Temp (!) 97.1 F (36.2 C) (Oral)   Ht 5' 2.5" (1.588 m)   Wt 189 lb 9.6 oz (86 kg)   SpO2 95%   BMI 34.13 kg/m    Physical Examination:   General Appearance: No distress  EYES PERRLA, EOM intact.   NECK Supple, No  JVD Pulmonary: bibasilar crackles CardiovascularNormal S1,S2.  No m/r/g.   Abdomen: Benign, Soft, non-tender.    ALL OTHER ROS ARE NEGATIVE      Lab Results:  CBC    Component Value Date/Time   WBC 9.8 01/25/2021 1028   RBC 3.83 (L) 01/25/2021 1028   HGB 11.1 (L) 01/25/2021 1028   HCT 33.5 (L) 01/25/2021 1028   HCT 27.1 (L) 11/25/2016 2153   PLT 268 01/25/2021 1028   MCV 87.5 01/25/2021 1028   MCH 29.0 01/25/2021 1028   MCHC 33.1 01/25/2021 1028   RDW 15.5 01/25/2021 1028   LYMPHSABS 0.8 01/25/2021 1028   MONOABS 0.6 01/25/2021 1028   EOSABS 0.0 01/25/2021 1028   BASOSABS 0.0 01/25/2021 1028    BMET    Component Value Date/Time   NA 133 (L) 01/25/2021 1028   K 3.8 01/25/2021 1028   CL 103 01/25/2021 1028   CO2 25 01/25/2021 1028   GLUCOSE 144 (H) 01/25/2021 1028   BUN 19 01/25/2021 1028   CREATININE 0.77 01/25/2021 1028   CALCIUM 8.3 (L) 01/25/2021 1028   GFRNONAA >60 01/25/2021 1028   GFRAA >60 10/28/2019 1213    BNP    Component Value Date/Time   BNP 59.6 12/06/2020 1044    ProBNP    Component Value Date/Time   PROBNP 819.0 (H) 04/13/2019 1712    Imaging: No results found.     PFT Results Latest Ref Rng & Units 07/13/2019  FVC-Pre L 2.04  FVC-Predicted Pre % 82  FVC-Post L 2.26  FVC-Predicted Post % 91  Pre FEV1/FVC % % 56  Post FEV1/FCV % % 56  FEV1-Pre L 1.14  FEV1-Predicted Pre % 59  FEV1-Post L 1.28  DLCO uncorrected ml/min/mmHg 6.67  DLCO UNC% % 32  DLVA Predicted % 49  TLC L 5.01  TLC % Predicted % 93  RV % Predicted % 127    No results found for: NITRICOXIDE      Assessment & Plan:   75 year old pleasant African-American female with multiple medical issues including end-stage COPD with chronic hypoxic respiratory failure with diastolic heart failure and pulmonary hypertension  with underlying sleep apnea with deconditioned state and obesity    OSA (obstructive sleep apnea) Excellent control and compliance on  nocturnal BiPAP.  Continue on current settings Compliance report reviewed in detail with the patient 100% compliance for days and greater than 4 hours AHI reduced to 0.5 BiPAP 18/13  COPD (chronic obstructive pulmonary disease) ([place]) Under good control -continue on current regimen  Continue Stiolto as prescribed    Chronic respiratory failure with hypoxia (HCC) Continue on oxygen 4 L continuous flow.  Use 6 L pulsing oxygen.  O2 saturation goals  greater than 88 to 90% Patient use and benefits from oxygen therapy    Pulmonary hypertension ([place]) Continue on current regimen.  Continue follow-up with cardiology    MEDICATION ADJUSTMENTS/LABS AND TESTS ORDERED: Excellent job keep up the great work!!!  Continue BiPAP as prescribed Continue oxygen as prescribed Continue inhalers as prescribed  Follow-up with cardiologist   CURRENT MEDICATIONS REVIEWED AT [place]   Patient  satisfied with Plan of action and management. All questions answered  Follow up  1 year  Total Time Spent  32 mins   Sholanda Croson Patricia Pesa, M.D.  [facility]  Medical Director Onward Director [facility] Cardio-Pulmonary Department

## 2021-03-12 ENCOUNTER — Other Ambulatory Visit: Payer: Self-pay

## 2021-03-12 ENCOUNTER — Ambulatory Visit (HOSPITAL_COMMUNITY)
Admission: RE | Admit: 2021-03-12 | Discharge: 2021-03-12 | Disposition: A | Discharge status: Home / Self Care | Payer: Medicare Other | Source: Ambulatory Visit | Attending: Cardiology | Admitting: Cardiology

## 2021-03-12 ENCOUNTER — Encounter (HOSPITAL_COMMUNITY): Payer: Self-pay | Admitting: Cardiology

## 2021-03-12 VITALS — BP 122/60 | HR 89 | Wt 191.4 lb

## 2021-03-12 DIAGNOSIS — Z79899 Other long term (current) drug therapy: Secondary | ICD-10-CM | POA: Diagnosis not present

## 2021-03-12 DIAGNOSIS — Z09 Encounter for follow-up examination after completed treatment for conditions other than malignant neoplasm: Secondary | ICD-10-CM | POA: Diagnosis not present

## 2021-03-12 DIAGNOSIS — Z8616 Personal history of COVID-19: Secondary | ICD-10-CM | POA: Diagnosis not present

## 2021-03-12 DIAGNOSIS — I5032 Chronic diastolic (congestive) heart failure: Secondary | ICD-10-CM | POA: Diagnosis present

## 2021-03-12 DIAGNOSIS — G4733 Obstructive sleep apnea (adult) (pediatric): Secondary | ICD-10-CM | POA: Diagnosis not present

## 2021-03-12 DIAGNOSIS — E119 Type 2 diabetes mellitus without complications: Secondary | ICD-10-CM | POA: Diagnosis not present

## 2021-03-12 DIAGNOSIS — J439 Emphysema, unspecified: Secondary | ICD-10-CM | POA: Insufficient documentation

## 2021-03-12 DIAGNOSIS — I272 Pulmonary hypertension, unspecified: Secondary | ICD-10-CM | POA: Diagnosis not present

## 2021-03-12 DIAGNOSIS — Z87891 Personal history of nicotine dependence: Secondary | ICD-10-CM | POA: Diagnosis not present

## 2021-03-12 DIAGNOSIS — Z86718 Personal history of other venous thrombosis and embolism: Secondary | ICD-10-CM | POA: Diagnosis not present

## 2021-03-12 DIAGNOSIS — J9611 Chronic respiratory failure with hypoxia: Secondary | ICD-10-CM | POA: Diagnosis not present

## 2021-03-12 DIAGNOSIS — Z9981 Dependence on supplemental oxygen: Secondary | ICD-10-CM | POA: Insufficient documentation

## 2021-03-12 DIAGNOSIS — I2510 Atherosclerotic heart disease of native coronary artery without angina pectoris: Secondary | ICD-10-CM | POA: Insufficient documentation

## 2021-03-12 DIAGNOSIS — I110 Hypertensive heart disease with heart failure: Secondary | ICD-10-CM | POA: Diagnosis not present

## 2021-03-12 DIAGNOSIS — I2721 Secondary pulmonary arterial hypertension: Secondary | ICD-10-CM | POA: Diagnosis not present

## 2021-03-12 DIAGNOSIS — I50810 Right heart failure, unspecified: Secondary | ICD-10-CM

## 2021-03-12 LAB — BASIC METABOLIC PANEL
Anion gap: 6 (ref 5–15)
BUN: 16 mg/dL (ref 8–23)
CO2: 24 mmol/L (ref 22–32)
Calcium: 8.6 mg/dL — ABNORMAL LOW (ref 8.9–10.3)
Chloride: 107 mmol/L (ref 98–111)
Creatinine, Ser: 0.82 mg/dL (ref 0.44–1.00)
GFR, Estimated: >60 mL/min (ref >60)
Glucose, Bld: 96 mg/dL (ref 70–99)
Potassium: 3.8 mmol/L (ref 3.5–5.1)
Sodium: 137 mmol/L (ref 135–145)

## 2021-03-12 LAB — BRAIN NATRIURETIC PEPTIDE: B Natriuretic Peptide: 103.6 pg/mL — ABNORMAL HIGH (ref 0.0–100.0)

## 2021-03-12 NOTE — Progress Notes (Signed)
PCP: Albina Billet, MD Cardiology: Dr. Fletcher Anon HF Cardiology: Dr. Aundra Dubin  75 y.o. with history of chronic hypoxemic respiratory failure, OSA, and chronic diastolic CHF was referred by Dr. Fletcher Anon for evaluation of CHF and pulmonary hypertension.  Patient has been on home oxygen for 2-3 years per her report.  She was a smoker remotely. She had a DVT in 2018.  Anticoagulation was stopped due to refractory anemia and IVC filter was placed.  CT chest did not show a PE. She has OSA and uses CPAP.  Last echo in 1/21 showed normal LV EF with PASP 80 and severely dilated RV with mild RV dysfunction and D-shaped septum. She has been on Lasix for diuresis due to RV failure.    RHC/LHC was done in 3/21, showing mild CAD; mean RA 12, PA 68/25 mean 42, mean PCWP 9, CI 4.1, PVR 4.13 WU.  V/Q scan in 3/21 did not show evidence for chronic PE.  High resolution chest CT in 3/21 did not show evidence for ILD, it did show mild-moderate emphysema. PFTs (3/21) showed moderate-severe COPD.   She has been diagnosed with GI stromal tumor and is now on [place].   Echo 5/22 showed EF 60-65%, low normal RV function, mild RV enlargement, PASP 44 mmHg.  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   She returns for followup of pulmonary hypertension and RV failure.  Wearing home oxygen, 4L at rest and 6L with exertion. Weight is stable. She was unable to tolerate Tyvaso due to intractable nausea and vomiting.  She is short of breath with "long walks."  No lightheadedness/dizziness.  No chest pain.  Cancer is stable on Gleevec. She is taking Lasix 40 qam/20 qpm.        6 minute walk (3/21): 182 m 6 minute walk (5/21): 243 m, oxygen saturation dropped to as low as 70s 6 minute walk (7/21): Only walked 3 min bc oxygen saturation dropped to 70s and CMA stopped walk, she went 121 m.  6 minute walk (3/22): 298 m  Labs (12/20): BNP 819 Labs (2/21): K 4.4, creatinine 0.72 Labs (3/21): RF normal, ANA negative, anti-Jo-1 negative,  anti-SCL-70 negative Labs (5/21): K 4.3, creatinine 0.88 Labs (6/21): hgb 10 Labs (3/22): BNP 89 Labs (4/22): K 3.9, creatinine 0.93 Labs (8/22): K 3.5, creatinine 0.72 Labs (10/22): K 4.8, creatinine 0.77, hgb 11.1  PMH: 1. COVID-19 PNA 8/20.  2. Type 2 diabetes 3. DVT: right leg, 2018.  - Has IVC filter, not anticoagulated due to h/o anemia.  4. HTN 5. Hyperlipidemia.  6. OSA: Uses Bipap.  7. COPD: Chronic hypoxemic respiratory failure, 3 L home oxygen.  8. Hyponatremia on HCTZ 9. Chronic diastolic CHF:  - Lexiscan Cardiolite in 2/19 was low risk no ischemia.  - Echo (1/21): EF 65-70%, no LVH, trivial MR, PASP 80 mmHg, severe RV enlargement with mildly decreased RV systolic function, D-shaped interventricular septum, dilated IVC.  - CTA chest (8/18): No evidence for PE.  10. Pulmonary hypertension: RHC/LHC 3/21 with mild CAD; mean RA 12, PA 68/25 mean 42, mean PCWP 9, CI 4.1, PVR 4.13 WU.  - Serologic workup negative.  - V/Q scan (3/21): No chronic PE.  - HRCT (3/21): No ILD, mild-moderate COPD. - PFTs (3/21): moderate-severe COPD - Echo (5/22): EF 60-65%, low normal RV function, mild RV enlargement, PASP 44 mmHg 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  Social History   Socioeconomic History   Marital status: Single  Spouse name: Not on file   Number of children: 2   Years of education: Not on file   Highest education level: Not on file  Occupational History   Occupation: CELL    Employer: RETIRED  Tobacco Use   Smoking status: Former    Packs/day: 1.50    Years: 20.00    Pack years: 30.00    Types: Cigarettes    Quit date: 1999    Years since quitting: 23.9   Smokeless tobacco: Never  Vaping Use   Vaping Use: Never used  Substance and Sexual Activity   Alcohol use: No    Alcohol/week: 0.0 standard drinks   Drug use: No   Sexual activity: Not Currently  Other Topics Concern   Not on file  Social History Narrative    Independent at baseline. Lives at home with her brother; in [place]. Quit smoking 20 years; no alcohol.    Social Determinants of Health   Financial Resource Strain: Not on file  Food Insecurity: Not on file  Transportation Needs: Not on file  Physical Activity: Not on file  Stress: Not on file  Social Connections: Not on file  Intimate Partner Violence: Not on file   Family History  Problem Relation Age of Onset   Prostate cancer Father    Other Sister        mouth cancer   Hypertension Mother    ROS: All systems reviewed and negative except as per HPI.   Current Outpatient Medications  Medication Sig Dispense Refill   ACCU-CHEK AVIVA PLUS test strip 1 each 2 (two) times daily.     Accu-Chek Softclix Lancets lancets USE 1 TO CHECK GLUCOSE TWICE DAILY     acetaminophen (TYLENOL) 325 MG tablet Take 2 tablets (650 mg total) by mouth every 6 (six) hours as needed for mild pain (or Fever >/= 101).     aspirin 81 MG chewable tablet Chew 1 tablet (81 mg total) by mouth daily. 30 tablet 0   dapagliflozin propanediol (FARXIGA) 10 MG TABS tablet TAKE 1 TABLET BY MOUTH ONCE DAILY 90 tablet 3   ferrous gluconate (FERGON) 325 MG tablet Take 325 mg by mouth in the morning and at bedtime.      furosemide (LASIX) 20 MG tablet Take 1 tablet (20 mg total) by mouth daily. Take 20 mg by mouth in the morning. (Patient taking differently: Take 20 mg by mouth daily. Take 40 mg by mouth in the morning and 20 mg in pm) 30 tablet    HUMALOG KWIKPEN 100 UNIT/ML KiwkPen Inject 10 Units into the skin 2 (two) times daily.      imatinib (GLEEVEC) 100 MG tablet TAKE 4 TABLETS (400 MG TOTAL) BY MOUTH DAILY. TAKE WITH MEALS AND LARGE GLASS OF WATER 120 tablet 1   insulin glargine (LANTUS) 100 UNIT/ML injection Inject 32 Units into the skin in the morning.     latanoprost (XALATAN) 0.005 % ophthalmic solution Place 1 drop into both eyes at bedtime.      lovastatin (MEVACOR) 20 MG tablet Take 20 mg by mouth  every evening.      metFORMIN (GLUCOPHAGE) 1000 MG tablet Take 1,000 mg by mouth daily.      Multiple Vitamin (MULTIVITAMIN) tablet Take 1 tablet by mouth daily.     omeprazole (PRILOSEC) 40 MG capsule Take 40 mg by mouth daily.     OPSUMIT 10 MG tablet Take 1 tablet daily. 30 tablet 11   potassium chloride SA (KLOR-CON) 20  MEQ tablet Take 1 tablet (20 mEq total) by mouth daily. 90 tablet 3   STIOLTO RESPIMAT 2.5-2.5 MCG/ACT AERS INHALE 2 PUFFS BY MOUTH ONCE DAILY 4 g 3   tadalafil, PAH, (ALYQ) 20 MG tablet Take 2 tablets (40 mg total) by mouth daily. 60 tablet 11   timolol (TIMOPTIC) 0.5 % ophthalmic solution Place 1 drop into both eyes 2 (two) times daily.      No current facility-administered medications for this encounter.   BP 122/60   Pulse 89   Wt 86.8 kg (191 lb 6.4 oz)   SpO2 93% Comment: 6l  BMI 34.45 kg/m  General: NAD Neck: No JVD, no thyromegaly or thyroid nodule.  Lungs: Dry crackles at bases CV: Nondisplaced PMI.  Heart regular S1/S2, no S3/S4, no murmur.  Trace ankle edema.  No carotid bruit.  Normal pedal pulses.  Abdomen: Soft, nontender, no hepatosplenomegaly, no distention.  Skin: Intact without lesions or rashes.  Neurologic: Alert and oriented x 3.  Psych: Normal affect. Extremities: No clubbing or cyanosis.  HEENT: Normal.   Assessment/Plan: 1. RV failure: Echo in 1/21 showed significant RV dysfunction with severe pulmonary hypertension and D-shaped interventricular septum.  Based on RHC, it looks like RV failure is due to pulmonary arterial hypertension rather than LV failure (normal PCWP).  Repeated echo in 5/22 showed EF 60-65%, low normal RV function, mild RV enlargement, PASP 44 mmHg. She is not significantly volume overloaded on exam today.   - Continue current Lasix.  Check BMET/BNP.   - Continue Farxiga.  2. Pulmonary HTN: Echo in 1/21 with RV failure and estimate PA systolic pressure 80 mmHg.  RHC in 3/21 showed pulmonary arterial hypertension.   Serologic workup was negative.  V/Q scan showed no chronic PE.  HRCT showed no ILD, mild-moderate emphysema.  PFTs suggestive of moderate COPD. Pulmonary hypertension seems out of proportion to parenchymal disease, suspect significant group 1 component to her [place] in addition to group 3 (COPD).  She has been symptomatically improved with selective pulmonary vasodilators.  She was unable to tolerate Tyvaso.  - Continue to use Bipap at night, oxygen during the day.  - Continue Opsumit 10 mg daily.  - Continue tadalafil 40 mg daily.  - Check BNP today.   - She needs a 6 minute walk today.  3. H/o DVT: 2018.  She has IVC filter and is not anticoagulated due to h/o anemia.  4. Type 2 diabetes: She is now on Farxiga.  5. Chronic hypoxemic respiratory failure: Home oxygen.  Moderate COPD on PFTs, mild-moderate emphysema on HRCT.  Suspect pulmonary arterial hypertension also plays a role.  - Continue home oxygen.  Followup in 4 months.     Susan Barajas 03/12/2021

## 2021-03-12 NOTE — Patient Instructions (Addendum)
EKG done today.  6 minute walk test done today.  Labs done today. We will contact you only if your labs are abnormal.  No medication changes were made. Please continue all current medications as prescribed.  Your physician recommends that you schedule a follow-up appointment in: 4 months with Dr. Aundra Dubin  If you have any questions or concerns before your next appointment please send [name] a message through [facility] or call our office at [phone].    TO LEAVE A MESSAGE FOR THE NURSE SELECT OPTION 2, PLEASE LEAVE A MESSAGE INCLUDING: YOUR NAME DATE OF BIRTH CALL BACK NUMBER REASON FOR CALL**this is important as we prioritize the call backs  YOU WILL RECEIVE A CALL BACK THE SAME DAY AS LONG AS YOU CALL BEFORE 4:00 PM   Do the following things EVERYDAY: Weigh yourself in the morning before breakfast. Write it down and keep it in a log. Take your medicines as prescribed Eat low salt foods--Limit salt (sodium) to 2000 mg per day.  Stay as active as you can everyday Limit all fluids for the day to less than 2 liters   At the [facility], you and your health needs are our priority. As part of our continuing mission to provide you with exceptional heart care, we have created designated Provider Care Teams. These Care Teams include your primary Cardiologist (physician) and Advanced Practice Providers (APPs- Physician Assistants and Nurse Practitioners) who all work together to provide you with the care you need, when you need it.   You may see any of the following providers on your designated Care Team at your next follow up: Dr Glori Bickers Dr Haynes Kerns, NP Lyda Jester, [place] Audry Riles, PharmD   Please be sure to bring in all your medications bottles to every appointment.

## 2021-03-26 ENCOUNTER — Other Ambulatory Visit (HOSPITAL_COMMUNITY): Payer: Self-pay

## 2021-03-27 ENCOUNTER — Other Ambulatory Visit (HOSPITAL_COMMUNITY): Payer: Self-pay

## 2021-03-28 ENCOUNTER — Telehealth (HOSPITAL_COMMUNITY): Payer: Self-pay | Admitting: Pharmacist

## 2021-03-28 NOTE — Telephone Encounter (Signed)
Patient Advocate Encounter   Received notification from [employer] that prior authorization for Opsumit and tadalafil are required.   PA submitted on CoverMyMeds Status is pending   Will continue to follow.  Audry Riles, PharmD, BCPS, BCCP, CPP Heart Failure Clinic Pharmacist [phone]

## 2021-03-29 NOTE — Telephone Encounter (Signed)
Advanced Heart Failure Patient Advocate Encounter  Prior Authorizations for Opsumit and tadalafil have been approved.    Effective dates: 04/14/20 through 04/13/22  Audry Riles, PharmD, BCPS, BCCP, CPP Heart Failure Clinic Pharmacist [phone]

## 2021-04-18 ENCOUNTER — Other Ambulatory Visit: Payer: Self-pay | Admitting: *Deleted

## 2021-04-18 DIAGNOSIS — E611 Iron deficiency: Secondary | ICD-10-CM

## 2021-04-23 ENCOUNTER — Other Ambulatory Visit (HOSPITAL_COMMUNITY): Payer: Self-pay

## 2021-04-23 ENCOUNTER — Other Ambulatory Visit: Payer: Self-pay | Admitting: Internal Medicine

## 2021-04-23 DIAGNOSIS — C49A3 Gastrointestinal stromal tumor of small intestine: Secondary | ICD-10-CM

## 2021-04-23 MED ORDER — IMATINIB MESYLATE 100 MG PO TABS
ORAL_TABLET | ORAL | 1 refills | Status: DC
Start: 2021-04-23 — End: 2021-06-17
  Filled 2021-04-23: qty 120, 30d supply, fill #0
  Filled 2021-05-17: qty 120, 30d supply, fill #1

## 2021-04-25 ENCOUNTER — Other Ambulatory Visit (HOSPITAL_COMMUNITY): Payer: Self-pay

## 2021-04-26 ENCOUNTER — Inpatient Hospital Stay (HOSPITAL_BASED_OUTPATIENT_CLINIC_OR_DEPARTMENT_OTHER): Payer: Medicare Other | Admitting: Internal Medicine

## 2021-04-26 ENCOUNTER — Encounter: Payer: Self-pay | Admitting: Internal Medicine

## 2021-04-26 ENCOUNTER — Inpatient Hospital Stay: Payer: Medicare Other

## 2021-04-26 ENCOUNTER — Inpatient Hospital Stay: Payer: Medicare Other | Attending: Internal Medicine

## 2021-04-26 ENCOUNTER — Other Ambulatory Visit: Payer: Self-pay

## 2021-04-26 DIAGNOSIS — Z9981 Dependence on supplemental oxygen: Secondary | ICD-10-CM | POA: Insufficient documentation

## 2021-04-26 DIAGNOSIS — D539 Nutritional anemia, unspecified: Secondary | ICD-10-CM | POA: Diagnosis not present

## 2021-04-26 DIAGNOSIS — E611 Iron deficiency: Secondary | ICD-10-CM

## 2021-04-26 DIAGNOSIS — Z7982 Long term (current) use of aspirin: Secondary | ICD-10-CM | POA: Insufficient documentation

## 2021-04-26 DIAGNOSIS — Z794 Long term (current) use of insulin: Secondary | ICD-10-CM | POA: Insufficient documentation

## 2021-04-26 DIAGNOSIS — G4733 Obstructive sleep apnea (adult) (pediatric): Secondary | ICD-10-CM | POA: Diagnosis not present

## 2021-04-26 DIAGNOSIS — Z7984 Long term (current) use of oral hypoglycemic drugs: Secondary | ICD-10-CM | POA: Diagnosis not present

## 2021-04-26 DIAGNOSIS — J449 Chronic obstructive pulmonary disease, unspecified: Secondary | ICD-10-CM | POA: Diagnosis not present

## 2021-04-26 DIAGNOSIS — Z808 Family history of malignant neoplasm of other organs or systems: Secondary | ICD-10-CM | POA: Insufficient documentation

## 2021-04-26 DIAGNOSIS — I272 Pulmonary hypertension, unspecified: Secondary | ICD-10-CM | POA: Diagnosis not present

## 2021-04-26 DIAGNOSIS — C49A3 Gastrointestinal stromal tumor of small intestine: Secondary | ICD-10-CM

## 2021-04-26 DIAGNOSIS — K219 Gastro-esophageal reflux disease without esophagitis: Secondary | ICD-10-CM | POA: Diagnosis not present

## 2021-04-26 DIAGNOSIS — Z79899 Other long term (current) drug therapy: Secondary | ICD-10-CM | POA: Insufficient documentation

## 2021-04-26 DIAGNOSIS — D509 Iron deficiency anemia, unspecified: Secondary | ICD-10-CM | POA: Insufficient documentation

## 2021-04-26 DIAGNOSIS — E785 Hyperlipidemia, unspecified: Secondary | ICD-10-CM | POA: Diagnosis not present

## 2021-04-26 DIAGNOSIS — Z8042 Family history of malignant neoplasm of prostate: Secondary | ICD-10-CM | POA: Diagnosis not present

## 2021-04-26 DIAGNOSIS — E119 Type 2 diabetes mellitus without complications: Secondary | ICD-10-CM | POA: Insufficient documentation

## 2021-04-26 DIAGNOSIS — I110 Hypertensive heart disease with heart failure: Secondary | ICD-10-CM | POA: Insufficient documentation

## 2021-04-26 DIAGNOSIS — Z86718 Personal history of other venous thrombosis and embolism: Secondary | ICD-10-CM | POA: Insufficient documentation

## 2021-04-26 DIAGNOSIS — Z87891 Personal history of nicotine dependence: Secondary | ICD-10-CM | POA: Insufficient documentation

## 2021-04-26 DIAGNOSIS — I509 Heart failure, unspecified: Secondary | ICD-10-CM | POA: Insufficient documentation

## 2021-04-26 DIAGNOSIS — M797 Fibromyalgia: Secondary | ICD-10-CM | POA: Diagnosis not present

## 2021-04-26 LAB — CBC WITH DIFFERENTIAL/PLATELET
Abs Immature Granulocytes: 0.01 10*3/uL (ref 0.00–0.07)
Basophils Absolute: 0 10*3/uL (ref 0.0–0.1)
Basophils Relative: 0 %
Eosinophils Absolute: 0 10*3/uL (ref 0.0–0.5)
Eosinophils Relative: 0 %
HCT: 34.3 % — ABNORMAL LOW (ref 36.0–46.0)
Hemoglobin: 11.2 g/dL — ABNORMAL LOW (ref 12.0–15.0)
Immature Granulocytes: 0 %
Lymphocytes Relative: 13 %
Lymphs Abs: 0.7 10*3/uL (ref 0.7–4.0)
MCH: 30.8 pg (ref 26.0–34.0)
MCHC: 32.7 g/dL (ref 30.0–36.0)
MCV: 94.2 fL (ref 80.0–100.0)
Monocytes Absolute: 0.4 10*3/uL (ref 0.1–1.0)
Monocytes Relative: 8 %
Neutro Abs: 4.1 10*3/uL (ref 1.7–7.7)
Neutrophils Relative %: 79 %
Platelets: 201 10*3/uL (ref 150–400)
RBC: 3.64 MIL/uL — ABNORMAL LOW (ref 3.87–5.11)
RDW: 14.4 % (ref 11.5–15.5)
WBC: 5.2 10*3/uL (ref 4.0–10.5)
nRBC: 0 % (ref 0.0–0.2)

## 2021-04-26 LAB — COMPREHENSIVE METABOLIC PANEL
ALT: 13 U/L (ref 0–44)
AST: 17 U/L (ref 15–41)
Albumin: 3.8 g/dL (ref 3.5–5.0)
Alkaline Phosphatase: 57 U/L (ref 38–126)
Anion gap: 5 (ref 5–15)
BUN: 22 mg/dL (ref 8–23)
CO2: 25 mmol/L (ref 22–32)
Calcium: 8.4 mg/dL — ABNORMAL LOW (ref 8.9–10.3)
Chloride: 101 mmol/L (ref 98–111)
Creatinine, Ser: 0.86 mg/dL (ref 0.44–1.00)
GFR, Estimated: >60 mL/min (ref >60)
Glucose, Bld: 156 mg/dL — ABNORMAL HIGH (ref 70–99)
Potassium: 4.2 mmol/L (ref 3.5–5.1)
Sodium: 131 mmol/L — ABNORMAL LOW (ref 135–145)
Total Bilirubin: 0.3 mg/dL (ref 0.3–1.2)
Total Protein: 7.3 g/dL (ref 6.5–8.1)

## 2021-04-26 LAB — IRON AND TIBC
Iron: 37 ug/dL (ref 28–170)
Saturation Ratios: 13 % (ref 10.4–31.8)
TIBC: 283 ug/dL (ref 250–450)
UIBC: 246 ug/dL

## 2021-04-26 LAB — FERRITIN: Ferritin: 78 ng/mL (ref 11–307)

## 2021-04-26 NOTE — Progress Notes (Signed)
Patient denies new problems/concerns today.

## 2021-04-26 NOTE — Assessment & Plan Note (Addendum)
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  Currently on adjuvant Gleevec [3-5 years].  #Tolerating Gleevec well except for mild side effects [diarrhea]; swelling in the feet [see below]  #Swelling in the feet-grade 1.;  Continue compression stockings/diuretics as needed.  #CHF/COPD home O2 4-6Lits;-borderline respiratory status-STABLE.   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  Today hemoglobin is  11.3  HOLD Venofer today-STABLE.  # Myalgias/arthralgias question Gleevec versus others- recommend ca+vit D; tylenol as needed.   # DISPOSITION:.   # HOLD venofer today # Follow up in 3  month-  MD; labs-cbc/cmp;iron studies/ferritin; possible venofer;Dr.B

## 2021-04-26 NOTE — Progress Notes (Signed)
[place] NOTE  Patient Care Team: Albina Billet, MD as PCP - General (Unknown Physician Specialty) Wellington Hampshire, MD as PCP - Cardiology (Cardiology) Christene Lye, MD (General Surgery) Albina Billet, MD (Internal Medicine) Delana Meyer, Dolores Lory, MD (Vascular Surgery) Wilford Corner, MD as Consulting Physician (Gastroenterology) Cammie Sickle, MD as Consulting Physician (Hematology and Oncology)  CHIEF COMPLAINTS/PURPOSE OF CONSULTATION: GIST   HEMATOLOGY HISTORY  Oncology History Overview Note  NOV 2021- CT Bx- TUMOR  Tumor Site: Peritoneum, jejunal  Histologic Type: Gastrointestinal stromal tumor, spindle cell type  Mitotic Rate: 1 per 5 mm squared  Histologic Grade: G1, low grade (mitotic rate less than or equal to 5  per 5 mm squared)- STAGE T2N1 [possible mesneteric LN]; CT A/P-9.8 x9.8 cm  # NOV 29th 2021- GLEEVEC 400 mg/day neoadjuvant therapy 6 months.   SURGICAL PATHOLOGY  CASE: [id number]  PATIENT: Susan Barajas  Surgical Pathology Report      Specimen Submitted:  A. Lymph node, mesenteric  B. Lymph node, Proximal mesenteric  C. Small bowel with GIST   Clinical History: GIST small bowel       DIAGNOSIS:  A. LYMPH NODE, MESENTERIC; EXCISIONAL BIOPSY:  - REACTIVE LYMPHOID FOLLICULAR HYPERPLASIA WITH PROGRESSIVE  TRANSFORMATION OF GERMINAL CENTERS.  - NEGATIVE FOR MALIGNANCY, ONE LYMPH NODE (0/1).   B. LYMPH NODE, PROXIMAL MESENTERIC; EXCISIONAL BIOPSY:  - REACTIVE LYMPHOID FOLLICULAR HYPERPLASIA.  - NEGATIVE FOR MALIGNANCY, TWO LYMPH NODES (0/2).   C. SMALL BOWEL WITH GIST; RESECTION:  - GASTROINTESTINAL STROMAL TUMOR.  - SEE CANCER SUMMARY BELOW.   CASE SUMMARY: (GASTROINTESTINAL STROMAL TUMOR (GIST): Resection)  Standard(s): AJCC-UICC 8   CLINICAL  Pre-resection Treatment: Previous biopsy, systemic therapy performed  (Gleevec)   SPECIMEN  Procedure: Resection (partial small bowel)    TUMOR  Tumor Focality:  Unifocal  Tumor Site: Jejunum  Tumor Size: 9.7 x 9.2 x 6.3 cm  Histologic Type: Gastrointestinal stromal tumor, spindle cell type  Mitotic Rate:  Specify mitotic rate per 5 mm squared: Less than 5 per 5  mm squared  Histologic Grade: G1, low grade  Necrosis: Not identified  Treatment Effect: Present, approximately 30% viable tumor remains  Risk Assessment: Moderate (24%) risk of progressive disease   MARGINS:  Margin status:  Margin(s) involved by GIST: Deep, see comment.   Comment:  Tumor is present on ink on the periphery of the tumor nodule.  The  nature of the specimen precludes accurate assessment of where the tumor  may have been attached in situ (apart from the small bowel attachment).  Proximal and distal bowel margins cannot be reliably assessed, given  that they were received detached from the tumor specimen.   REGIONAL LYMPH NODES  Regional lymph node status:  Regional lymph nodes present, all regional lymph nodes negative for  tumor.  Number of lymph nodes examined: Three    # CHRONIC ANEMIA EGD/capsule-WNL-2018 [Dr.vanga]-UGIB ; colonoscopy-2018 [Elliot] ; capsule-not done; hemoglobin around 8.8; iron saturation 5% [Dr.Tate].  Poor tolerance of p.o. 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   GIST (gastrointestinal stromal tumor) of small bowel, malignant ([place])  03/12/2020 Initial Diagnosis   GIST (gastrointestinal stromal tumor) of small bowel, malignant ([facility])     HISTORY OF PRESENTING ILLNESS: Alone; ambulating independently. 6 L of oxygen nasal cannula.    Susan Barajas 76 y.o.  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  Patient denies any worsening swelling of the legs.  She continues to wear compression stockings.  Intermittently on diuretics. Denies any nausea vomiting from the [place] Walk.  Review of Systems  Constitutional:  Positive for malaise/fatigue. Negative for chills, diaphoresis, fever and weight loss.  HENT:  Negative for nosebleeds and sore throat.   Eyes:  Negative for double vision.  Respiratory:  Positive for shortness of breath. Negative for cough, hemoptysis, sputum production and wheezing.   Cardiovascular:  Negative for chest pain, palpitations, orthopnea and leg swelling.  Gastrointestinal:  Negative for abdominal pain, blood in stool, constipation, diarrhea, heartburn, melena, nausea and vomiting.  Genitourinary:  Negative for dysuria, frequency and urgency.  Musculoskeletal:  Negative for back pain and joint pain.  Skin: Negative.  Negative for itching and rash.  Neurological:  Negative for dizziness, tingling, focal weakness, weakness and headaches.  Endo/Heme/Allergies:  Does not bruise/bleed easily.  Psychiatric/Behavioral:  Negative for depression. The patient is not nervous/anxious and does not have insomnia.    MEDICAL HISTORY:  Past Medical History:  Diagnosis Date   Anemia    Asthma 1950   CHF (congestive heart failure) (HCC)    COPD (chronic obstructive pulmonary disease) (HCC)    Cor pulmonale (HCC)    DVT (deep venous thrombosis) ([place]) 2018   GERD (gastroesophageal reflux disease)    GIST (gastrointestinal stromal tumor) of small bowel, malignant (HCC)    Glaucoma 1949   Hyperlipidemia    Hypertension    Obesity, unspecified    OSA treated with BiPAP    Personal history of tobacco use, presenting hazards to health    Pneumonia    Presence of IVC filter    Pulmonary HTN (HCC)    Supplemental oxygen dependent     T2DM (type 2 diabetes mellitus) ([place])    Varicose veins of lower extremities with other complications     SURGICAL HISTORY: Past Surgical History:  Procedure Laterality Date   BREAST BIOPSY Right 1991   BREAST BIOPSY Left 2013   COLONOSCOPY  2010   Dr. Vira Agar   COLONOSCOPY  Jan 2016   Dr Vira Agar   ESOPHAGOGASTRODUODENOSCOPY N/A 11/26/2016   Procedure: ESOPHAGOGASTRODUODENOSCOPY (EGD);  Surgeon: Lin Landsman, MD;  Location: [facility] ENDOSCOPY;  Service: Gastroenterology;  Laterality: N/A;   GIVENS CAPSULE STUDY N/A 11/26/2016   Procedure: GIVENS CAPSULE STUDY, if EGD negative, plan to drop capsule with scope if EGD negative;  Surgeon: Lin Landsman, MD;  Location: [facility] ENDOSCOPY;  Service: Gastroenterology;  Laterality: N/A;   IVC FILTER INSERTION N/A 11/14/2016   Procedure: IVC Filter Insertion;  Surgeon: Katha Cabal, MD;  Location: [facility] LAB;  Service: Cardiovascular;  Laterality: N/A;   PARTIAL COLECTOMY N/A 11/05/2020   Procedure: SMALL BOWEL RESECTION, REMOVAL OF GIST TUMOR AND LYMPH NODES, UMBILICAL HERNIA REPAIR;  Surgeon: Robert Bellow, MD;  Location: [facility] ORS;  Service: General;  Laterality: N/A;  small bowel resection, RNFA to assist; TAPP block per anesthesia   RIGHT/LEFT HEART CATH AND CORONARY ANGIOGRAPHY N/A 06/30/2019   Procedure: RIGHT/LEFT HEART CATH  AND CORONARY ANGIOGRAPHY;  Surgeon: Larey Dresser, MD;  Location: [facility];  Service: Cardiovascular;  Laterality: N/A;   VEIN SURGERY Right 2006   Vein Closure Procedure; RF ablation of right GSV    SOCIAL HISTORY: Social History   Socioeconomic History   Marital status: Single    Spouse name: Not on file   Number of children: 2   Years of education: Not on file   Highest education level: Not on file  Occupational History   Occupation: CELL    Employer: RETIRED  Tobacco Use   Smoking status: Former    Packs/day: 1.50    Years: 20.00    Pack years: 30.00     Types: Cigarettes    Quit date: 1999    Years since quitting: 24.0   Smokeless tobacco: Never  Vaping Use   Vaping Use: Never used  Substance and Sexual Activity   Alcohol use: No    Alcohol/week: 0.0 standard drinks   Drug use: No   Sexual activity: Not Currently  Other Topics Concern   Not on file  Social History Narrative   Independent at baseline. Lives at home with her brother; in [place]. Quit smoking 20 years; no alcohol.    Social Determinants of Health   Financial Resource Strain: Not on file  Food Insecurity: Not on file  Transportation Needs: Not on file  Physical Activity: Not on file  Stress: Not on file  Social Connections: Not on file  Intimate Partner Violence: Not on file    FAMILY HISTORY: Family History  Problem Relation Age of Onset   Prostate cancer Father    Other Sister        mouth cancer   Hypertension Mother     ALLERGIES:  has No Known Allergies.  MEDICATIONS:  Current Outpatient Medications  Medication Sig Dispense Refill   ACCU-CHEK AVIVA PLUS test strip 1 each 2 (two) times daily.     Accu-Chek Softclix Lancets lancets USE 1 TO CHECK GLUCOSE TWICE DAILY     acetaminophen (TYLENOL) 325 MG tablet Take 2 tablets (650 mg total) by mouth every 6 (six) hours as needed for mild pain (or Fever >/= 101).     aspirin 81 MG chewable tablet Chew 1 tablet (81 mg total) by mouth daily. 30 tablet 0   dapagliflozin propanediol (FARXIGA) 10 MG TABS tablet TAKE 1 TABLET BY MOUTH ONCE DAILY 90 tablet 3   ferrous gluconate (FERGON) 325 MG tablet Take 325 mg by mouth in the morning and at bedtime.      furosemide (LASIX) 20 MG tablet Take 1 tablet (20 mg total) by mouth daily. Take 20 mg by mouth in the morning. (Patient taking differently: Take 20 mg by mouth daily. Take 40 mg by mouth in the morning and 20 mg in pm) 30 tablet    HUMALOG KWIKPEN 100 UNIT/ML KiwkPen Inject 10 Units into the skin 2 (two) times daily.      imatinib  (GLEEVEC) 100 MG tablet TAKE 4 TABLETS (400 MG TOTAL) BY MOUTH DAILY. TAKE WITH MEALS AND LARGE GLASS OF WATER 120 tablet 1   insulin glargine (LANTUS) 100 UNIT/ML injection Inject 32 Units into the skin in the morning.     latanoprost (XALATAN) 0.005 % ophthalmic solution Place 1 drop into both eyes at bedtime.      lovastatin (MEVACOR) 20 MG tablet Take 20 mg by mouth every evening.      metFORMIN (GLUCOPHAGE) 1000 MG tablet Take  1,000 mg by mouth daily.      Multiple Vitamin (MULTIVITAMIN) tablet Take 1 tablet by mouth daily.     omeprazole (PRILOSEC) 40 MG capsule Take 40 mg by mouth daily.     OPSUMIT 10 MG tablet Take 1 tablet daily. 30 tablet 11   potassium chloride SA (KLOR-CON) 20 MEQ tablet Take 1 tablet (20 mEq total) by mouth daily. 90 tablet 3   STIOLTO RESPIMAT 2.5-2.5 MCG/ACT AERS INHALE 2 PUFFS BY MOUTH ONCE DAILY 4 g 3   tadalafil, PAH, (ALYQ) 20 MG tablet Take 2 tablets (40 mg total) by mouth daily. 60 tablet 11   timolol (TIMOPTIC) 0.5 % ophthalmic solution Place 1 drop into both eyes 2 (two) times daily.      No current facility-administered medications for this visit.      PHYSICAL EXAMINATION:   Vitals:   04/26/21 1056  BP: (!) 136/59  Pulse: (!) 57  Resp: 16  Temp: 98.4 F (36.9 C)   Filed Weights   04/26/21 1056  Weight: 187 lb 9.6 oz (85.1 kg)    Physical Exam HENT:     Head: Normocephalic and atraumatic.     Mouth/Throat:     Pharynx: No oropharyngeal exudate.  Eyes:     Pupils: Pupils are equal, round, and reactive to light.  Cardiovascular:     Rate and Rhythm: Normal rate and regular rhythm.  Pulmonary:     Effort: No respiratory distress.     Breath sounds: No wheezing.     Comments: Decreased breath sounds. Abdominal:     General: Bowel sounds are normal. There is no distension.     Palpations: Abdomen is soft. There is no mass.     Tenderness: There is no abdominal tenderness. There is no guarding or rebound.   Musculoskeletal:        General: No tenderness. Normal range of motion.     Cervical back: Normal range of motion and neck supple.  Skin:    General: Skin is warm.  Neurological:     Mental Status: She is alert and oriented to person, place, and time.  Psychiatric:        Mood and Affect: Affect normal.    LABORATORY DATA:  I have reviewed the data as listed Lab Results  Component Value Date   WBC 5.2 04/26/2021   HGB 11.2 (L) 04/26/2021   HCT 34.3 (L) 04/26/2021   MCV 94.2 04/26/2021   PLT 201 04/26/2021   Recent Labs    11/09/20 0438 11/10/20 0428 11/23/20 1301 01/25/21 1028 03/12/21 1014 04/26/21 1025  NA 142   < > 140 133* 137 131*  K 3.9   < > 3.5 3.8 3.8 4.2  CL 113*   < > 103 103 107 101  CO2 26   < > _0 GLUCOSE 154*   < > 106* 144* 96 156*  BUN 13   < > _1 CREATININE 0.59   < > 0.72 0.77 0.82 0.86  CALCIUM 8.2*   < > 8.7* 8.3* 8.6* 8.4*  GFRNONAA >60   < > >60 >60 >60 >60  PROT 5.4*  --  6.9 6.4*  --  7.3  ALBUMIN 2.4*  --  3.4* 2.9*  --  3.8  AST 16  --  13* 18  --  17  ALT 13  --  10 15  --  13  ALKPHOS 44  --  46 61  --  57  BILITOT 0.7  --  0.7 0.2*  --  0.3  BILIDIR 0.1  --   --   --   --   --   IBILI 0.6  --   --   --   --   --    < > = values in this interval not displayed.     No results found.   GIST (gastrointestinal stromal tumor) of small bowel, malignant ([place]) # GIST of small bowel-low grade STAGE II [T3N0]-s/p neoadjuvant therapy with Gleevec x6 months [November-end of June 2022]-s/p resection ~30% viable tumor noted post resection specimen.  Currently on adjuvant Gleevec [3-5 years].  #Tolerating Gleevec well except for mild side effects [diarrhea]; swelling in the feet [see below]  #Swelling in the feet-grade 1.;  Continue compression stockings/diuretics as needed.  #CHF/COPD home O2 4-6Lits;-borderline respiratory status-STABLE.   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   Today hemoglobin is  11.3  HOLD Venofer today-STABLE.  # Myalgias/arthralgias question Gleevec versus others- recommend ca+vit D; tylenol as needed.   # DISPOSITION:.   # HOLD venofer today # Follow up in 3  month-  MD; labs-cbc/cmp;iron studies/ferritin; possible venofer;Dr.B     All questions were answered. The patient knows to call the clinic with any problems, questions or concerns.    Cammie Sickle, MD 04/26/2021 12:59 PM

## 2021-05-15 ENCOUNTER — Other Ambulatory Visit (HOSPITAL_COMMUNITY): Payer: Self-pay

## 2021-05-17 ENCOUNTER — Other Ambulatory Visit (HOSPITAL_COMMUNITY): Payer: Self-pay

## 2021-05-21 ENCOUNTER — Other Ambulatory Visit (HOSPITAL_COMMUNITY): Payer: Self-pay

## 2021-05-23 ENCOUNTER — Telehealth: Payer: Self-pay | Admitting: Internal Medicine

## 2021-05-23 MED ORDER — STIOLTO RESPIMAT 2.5-2.5 MCG/ACT IN AERS: INHALATION_SPRAY | RESPIRATORY_TRACT | 3 refills | Status: DC

## 2021-05-23 NOTE — Telephone Encounter (Signed)
Stiolto has been sent to preferred pharmacy.  Patient is aware and voiced her understanding.  Nothing further needed.

## 2021-06-13 ENCOUNTER — Other Ambulatory Visit (HOSPITAL_COMMUNITY): Payer: Self-pay

## 2021-06-17 ENCOUNTER — Other Ambulatory Visit (HOSPITAL_COMMUNITY): Payer: Self-pay

## 2021-06-17 ENCOUNTER — Other Ambulatory Visit: Payer: Self-pay | Admitting: Internal Medicine

## 2021-06-17 DIAGNOSIS — C49A3 Gastrointestinal stromal tumor of small intestine: Secondary | ICD-10-CM

## 2021-06-17 MED ORDER — IMATINIB MESYLATE 100 MG PO TABS
ORAL_TABLET | ORAL | 1 refills | Status: DC
  Filled 2021-06-17: qty 120, 30d supply, fill #0
  Filled 2021-07-09: qty 120, 30d supply, fill #1

## 2021-06-17 NOTE — Telephone Encounter (Signed)
CBC with Differential ?Order: [id number] ?Status: Final result    ?Visible to patient: No (inaccessible in MyChart)    ?Next appt: 07/10/2021 at 10:00 AM in Cardiology Susan Champagne, MD)    ?Dx: Iron deficiency; GIST (gastrointestin...    ?0 Result Notes ?          ?Component Ref Range & Units 1 mo ago ?(04/26/21) 4 mo ago ?(01/25/21) 6 mo ago ?(11/23/20) 7 mo ago ?(11/10/20) 7 mo ago ?(11/09/20) 7 mo ago ?(11/08/20) 7 mo ago ?(11/07/20)  ?WBC 4.0 - 10.5 K/uL 5.2  9.8  6.8  6.3  7.5  8.8  9.4   ?RBC 3.87 - 5.11 MIL/uL 3.64 Low   3.83 Low   3.40 Low   2.93 Low   2.84 Low   3.02 Low   3.10 Low    ?Hemoglobin 12.0 - 15.0 g/dL 11.2 Low   11.1 Low   10.0 Low   8.4 Low   8.6 Low   9.1 Low   9.3 Low    ?HCT 36.0 - 46.0 % 34.3 Low   33.5 Low   31.6 Low   26.9 Low   26.9 Low   28.4 Low   28.7 Low    ?MCV 80.0 - 100.0 fL 94.2  87.5  92.9  91.8  94.7  94.0  92.6   ?MCH 26.0 - 34.0 pg 30.8  29.0  29.4  28.7  30.3  30.1  30.0   ?MCHC 30.0 - 36.0 g/dL 32.7  33.1  31.6  31.2  32.0  32.0  32.4   ?RDW 11.5 - 15.5 % 14.4  15.5  16.2 High   15.4  16.0 High   16.5 High   16.9 High    ?Platelets 150 - 400 K/uL 201  268  342  220  200  207  207   ?nRBC 0.0 - 0.2 % 0.0  0.0  0.0  0.0 CM  0.0 CM  0.0 CM  0.0 CM   ?Neutrophils Relative % % 79  85  83       ?Neutro Abs 1.7 - 7.7 K/uL 4.1  8.2 High   5.6       ?Lymphocytes Relative % _0 ?Lymphs Abs 0.7 - 4.0 K/uL 0.7  0.8  0.7       ?Monocytes Relative % _1 ?Monocytes Absolute 0.1 - 1.0 K/uL 0.4  0.6  0.5       ?Eosinophils Relative % 0  0  0       ?Eosinophils Absolute 0.0 - 0.5 K/uL 0.0  0.0  0.0       ?Basophils Relative % 0  0  0       ?Basophils Absolute 0.0 - 0.1 K/uL 0.0  0.0  0.0       ?Immature Granulocytes % 0  1  0       ?Abs Immature Granulocytes 0.00 - 0.07 K/uL 0.01  0.08 High  CM  0.01 CM       ?Comment: Performed at [facility], [address], [place], [place] [address]  ?Resulting Agency  _2   [place] [facility]  ?  ? ?  ?  ?Specimen Collected: 04/26/21 10:25 Last Resulted: 04/26/21 10:40  ?  ?  Lab Flowsheet   ?  Order Details   ? View Encounter   ? Lab and Collection Details   ? Routing   ? Result History    ?View Encounter Conversation    ?  ?CM=Additional comments    ?  ?Result Care Coordination ? ? ?Patient Communication ? ? Add Comments   Not seen Back to Top  ?  ?  ? ?Other Results from 04/26/2021 ? ?Iron and TIBC([facility]) ?Order: [id number] ?Status: Final result    ?Visible to patient: No (inaccessible in MyChart)    ?Next appt: 07/10/2021 at 10:00 AM in Cardiology Susan Champagne, MD)    ?Dx: Iron deficiency    ?0 Result Notes ?         ?Component Ref Range & Units 1 mo ago ?(04/26/21) 4 mo ago ?(01/25/21) 1 yr ago ?(06/05/20) 1 yr ago ?(02/10/20) 1 yr ago ?(08/15/19) 4 yr ago ?(11/25/16)  ?Iron 28 - 170 ug/dL 37  49  38  48  11 Low   55   ?TIBC 250 - 450 ug/dL 283  244 Low   361  381  448  351   ?Saturation Ratios 10.4 - 31.8 % _0 Low   16   ?UIBC ug/dL 246  195 CM  323 CM  333 CM  437 CM  296   ?Comment: Performed at [facility], [address], [place], [place] [address]  ?Resulting Agency  _1  [facility]  ?  ? ?  ?  ?Specimen Collected: 04/26/21 10:25 Last Resulted: 04/26/21 12:16  ?  ?  Lab Flowsheet   ? Order Details   ? View Encounter   ? Lab and Collection Details   ? Routing   ? Result History    ?View Encounter Conversation    ?  ?CM=Additional comments    ?  ?Result Care Coordination ? ? ?Patient Communication ? ? Add Comments   Not seen Back to Top  ?  ?  ? ?  ?Ferritin ?Order: [id number] ?Status: Final result    ?Visible to patient: No (inaccessible in MyChart)    ?Next appt: 07/10/2021 at 10:00 AM in Cardiology Susan Champagne, MD)    ?Dx: Iron deficiency    ?0 Result Notes ?         ?Component Ref Range & Units 1 mo ago ?(04/26/21) 4 mo ago ?(01/25/21) 1 yr ago ?(06/05/20) 1 yr ago ?(02/10/20) 1 yr  ago ?(08/15/19) 4 yr ago ?(11/25/16)  ?Ferritin 11 - 307 ng/mL 78  138 CM  17 CM  27 CM  10 Low  CM  16   ?Comment: Performed at [facility], [address] [place], [place] [address]  ?Resulting Agency  _2  [place] [facility]  ?  ? ?  ?  ?Specimen Collected: 04/26/21 10:25 Last Resulted: 04/26/21 12:16  ?  ?  Lab Flowsheet   ? Order Details   ? View Encounter   ? Lab and Collection Details   ? Routing   ? Result History    ?View Encounter Conversation    ?  ?CM=Additional comments    ?  ?Result Care Coordination ? ? ?Patient Communication ? ? Add Comments   Not seen Back to Top  ?  ?  ? ?  ? Contains abnormal data Comprehensive  metabolic panel ?Order: [id number] ?Status: Final result    ?Visible to patient: No (inaccessible in MyChart)    ?Next appt: 07/10/2021 at 10:00 AM in Cardiology Susan Champagne, MD)    ?Dx: Iron deficiency; GIST (gastrointestin...    ?0 Result Notes ?          ?Component Ref Range & Units 1 mo ago ?(04/26/21) 3 mo ago ?(03/12/21) 4 mo ago ?(01/25/21) 6 mo ago ?(11/23/20) 7 mo ago ?(11/10/20) 7 mo ago ?(11/09/20) 7 mo ago ?(11/09/20)  ?Sodium 135 - 145 mmol/L 131 Low   137  133 Low   140  140   142   ?Potassium 3.5 - 5.1 mmol/L 4.2  3.8  3.8  3.5  3.8   3.9   ?Chloride 98 - 111 mmol/L 101  107  103  103  110   113 High    ?CO2 22 - 32 mmol/L _0 ?Glucose, Bld 70 - 99 mg/dL 156 High   96 CM  144 High  CM  106 High  CM  99 CM   154 High  CM   ?Comment: Glucose reference range applies only to samples taken after fasting for at least 8 hours.  ?BUN 8 - 23 mg/dL _1 ?Creatinine, Ser 0.44 - 1.00 mg/dL 0.86  0.82  0.77  0.72  0.50   0.59   ?Calcium 8.9 - 10.3 mg/dL 8.4 Low   8.6 Low   8.3 Low   8.7 Low   8.4 Low    8.2 Low    ?Total Protein 6.5 - 8.1 g/dL 7.3   6.4 Low   6.9   5.4 Low     ?Albumin 3.5 - 5.0 g/dL 3.8   2.9 Low   3.4 Low    2.4 Low     ?AST 15 - 41 U/L _2 Low    16    ?ALT 0 - 44  U/L _3 ?Alkaline Phosphatase 38 - 126 U/L 57   61  46   44    ?Total Bilirubin 0.3 - 1.2 mg/dL 0.3   0.2 Low   0.7   0.7    ?GFR, Estimated >60 mL/min >60  >60 CM  >60 CM  >60 CM  >60 CM   >60 CM   ?Comment: (NOTE)  ?Calculated using the CKD-EPI Creatinine Equation (2021)   ?Anion gap 5 - _4 CM  5 CM  10 CM  4 Low  CM   3 Low  CM   ?Comment: Performed at [facility], [address] [place], [place] [address]  ?Resulting Agency  _5  [place] [facility]  ?  ? ?  ?  ?Specimen Collected: 04/26/21 10:25 Last Resulted: 04/26/21 10:52  ?  ?  ?  ? ?

## 2021-07-09 ENCOUNTER — Other Ambulatory Visit (HOSPITAL_COMMUNITY): Payer: Self-pay

## 2021-07-10 ENCOUNTER — Other Ambulatory Visit (HOSPITAL_COMMUNITY): Payer: Self-pay

## 2021-07-10 ENCOUNTER — Other Ambulatory Visit: Payer: Self-pay

## 2021-07-10 ENCOUNTER — Ambulatory Visit (HOSPITAL_COMMUNITY)
Admission: RE | Admit: 2021-07-10 | Discharge: 2021-07-10 | Disposition: A | Discharge status: Home / Self Care | Payer: Medicare Other | Source: Ambulatory Visit | Attending: Cardiology | Admitting: Cardiology

## 2021-07-10 ENCOUNTER — Encounter (HOSPITAL_COMMUNITY): Payer: Self-pay | Admitting: Cardiology

## 2021-07-10 VITALS — BP 130/50 | HR 52 | Wt 193.6 lb

## 2021-07-10 DIAGNOSIS — Z79899 Other long term (current) drug therapy: Secondary | ICD-10-CM | POA: Diagnosis not present

## 2021-07-10 DIAGNOSIS — E119 Type 2 diabetes mellitus without complications: Secondary | ICD-10-CM | POA: Insufficient documentation

## 2021-07-10 DIAGNOSIS — J9611 Chronic respiratory failure with hypoxia: Secondary | ICD-10-CM | POA: Insufficient documentation

## 2021-07-10 DIAGNOSIS — I272 Pulmonary hypertension, unspecified: Secondary | ICD-10-CM | POA: Diagnosis not present

## 2021-07-10 DIAGNOSIS — Z86718 Personal history of other venous thrombosis and embolism: Secondary | ICD-10-CM | POA: Insufficient documentation

## 2021-07-10 DIAGNOSIS — Z9981 Dependence on supplemental oxygen: Secondary | ICD-10-CM | POA: Diagnosis not present

## 2021-07-10 DIAGNOSIS — I110 Hypertensive heart disease with heart failure: Secondary | ICD-10-CM | POA: Diagnosis not present

## 2021-07-10 DIAGNOSIS — I50810 Right heart failure, unspecified: Secondary | ICD-10-CM | POA: Diagnosis not present

## 2021-07-10 DIAGNOSIS — J439 Emphysema, unspecified: Secondary | ICD-10-CM | POA: Insufficient documentation

## 2021-07-10 DIAGNOSIS — I2721 Secondary pulmonary arterial hypertension: Secondary | ICD-10-CM | POA: Diagnosis not present

## 2021-07-10 DIAGNOSIS — G4733 Obstructive sleep apnea (adult) (pediatric): Secondary | ICD-10-CM | POA: Diagnosis not present

## 2021-07-10 LAB — BASIC METABOLIC PANEL
Anion gap: 7 (ref 5–15)
BUN: 21 mg/dL (ref 8–23)
CO2: 24 mmol/L (ref 22–32)
Calcium: 9 mg/dL (ref 8.9–10.3)
Chloride: 107 mmol/L (ref 98–111)
Creatinine, Ser: 0.93 mg/dL (ref 0.44–1.00)
GFR, Estimated: >60 mL/min (ref >60)
Glucose, Bld: 87 mg/dL (ref 70–99)
Potassium: 4.6 mmol/L (ref 3.5–5.1)
Sodium: 138 mmol/L (ref 135–145)

## 2021-07-10 LAB — BRAIN NATRIURETIC PEPTIDE: B Natriuretic Peptide: 56.8 pg/mL (ref 0.0–100.0)

## 2021-07-10 NOTE — Patient Instructions (Addendum)
There has been no changes  ? ?Labs done today, your results will be available in MyChart, we will contact you for abnormal readings. ? ?Your physician has requested that you have an echocardiogram. Echocardiography is a painless test that uses sound waves to create images of your heart. It provides your doctor with information about the size and shape of your heart and how well your heart?s chambers and valves are working. This procedure takes approximately one hour. There are no restrictions for this procedure. ? ?Your physician recommends that you schedule a follow-up appointment in: 4 month with echocardiogram (July 2023) ** please call the office in May to arrange your follow up appointment ** ? ?If you have any questions or concerns before your next appointment please send [name] a message through [place] or call our office at [phone].   ? ?TO LEAVE A MESSAGE FOR THE NURSE SELECT OPTION 2, PLEASE LEAVE A MESSAGE INCLUDING: ?YOUR NAME ?DATE OF BIRTH ?CALL BACK NUMBER ?REASON FOR CALL**this is important as we prioritize the call backs ? ?YOU WILL RECEIVE A CALL BACK THE SAME DAY AS LONG AS YOU CALL BEFORE 4:00 PM ?At the [facility], you and your health needs are our priority. As part of our continuing mission to provide you with exceptional heart care, we have created designated Provider Care Teams. These Care Teams include your primary Cardiologist (physician) and Advanced Practice Providers (APPs- Physician Assistants and Nurse Practitioners) who all work together to provide you with the care you need, when you need it.  ? ?You may see any of the following providers on your designated Care Team at your next follow up: ?Dr Glori Bickers ?Dr Loralie Champagne ?Darrick Grinder, NP ?Lyda Jester, PA ?Jessica Milford,NP ?Marlyce Huge, PA ?Audry Riles, PharmD ? ? ?Please be sure to bring in all your medications bottles to every appointment.  ? ? ?

## 2021-07-10 NOTE — [place] Notes (Signed)
6 Min Walk Test Completed ? ?Pt ambulated 365.76 meters  ?O2 Sat ranged 90-98 on 6L oxygen ?HR ranged 55-120 ? ?

## 2021-07-11 NOTE — Progress Notes (Signed)
PCP: Albina Billet, MD ?Cardiology: Dr. Fletcher Anon ?HF Cardiology: Dr. Aundra Dubin ? ?76 y.o. with history of chronic hypoxemic respiratory failure, OSA, and chronic diastolic CHF was referred by Dr. Fletcher Anon for evaluation of CHF and pulmonary hypertension.  Patient has been on home oxygen for 2-3 years per her report.  She was a smoker remotely. She had a DVT in 2018.  Anticoagulation was stopped due to refractory anemia and IVC filter was placed.  CT chest did not show a PE. She has OSA and uses CPAP.  Last echo in 1/21 showed normal LV EF with PASP 80 and severely dilated RV with mild RV dysfunction and D-shaped septum. She has been on Lasix for diuresis due to RV failure.   ? ?RHC/LHC was done in 3/21, showing mild CAD; mean RA 12, PA 68/25 mean 42, mean PCWP 9, CI 4.1, PVR 4.13 WU.  V/Q scan in 3/21 did not show evidence for chronic PE.  High resolution chest CT in 3/21 did not show evidence for ILD, it did show mild-moderate emphysema. PFTs (3/21) showed moderate-severe COPD.  ? ?She has been diagnosed with GI stromal tumor and is now on [place].  ? ?Echo 5/22 showed EF 60-65%, low normal RV function, mild RV enlargement, PASP 44 mmHg.  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  ? ?She returns for followup of pulmonary hypertension and RV failure.  Wearing home oxygen, 4L at rest and 6L with exertion. She was unable to tolerate Tyvaso due to intractable nausea and vomiting.  Weight is stable.  No dyspnea walking on flat ground with her oxygen.  No chest pain.  No lightheadedness.  No orthopnea/PND.   ? ?ECG (personally reviewed): NSR, PAC  ? ?6 minute walk (3/21): 182 m ?6 minute walk (5/21): 243 m, oxygen saturation dropped to as low as 70s ?6 minute walk (7/21): Only walked 3 min bc oxygen saturation dropped to 70s and CMA stopped walk, she went 121 m.  ?6 minute walk (3/22): 298 m ?6 minute walk (3/23): 54 m with her home oxygen.  ? ?Labs (12/20): BNP 819 ?Labs (2/21): K 4.4, creatinine 0.72 ?Labs (3/21): RF  normal, ANA negative, anti-Jo-1 negative, anti-SCL-70 negative ?Labs (5/21): K 4.3, creatinine 0.88 ?Labs (6/21): hgb 10 ?Labs (3/22): BNP 89 ?Labs (4/22): K 3.9, creatinine 0.93 ?Labs (8/22): K 3.5, creatinine 0.72 ?Labs (10/22): K 4.8, creatinine 0.77, hgb 11.1 ?Labs (1/13): K 4.2, creatinine 0.86 ? ?PMH: ?1. COVID-19 PNA 8/20.  ?2. Type 2 diabetes ?3. DVT: right leg, 2018.  ?- Has IVC filter, not anticoagulated due to h/o anemia.  ?4. HTN ?5. Hyperlipidemia.  ?6. OSA: Uses Bipap.  ?7. COPD: Chronic hypoxemic respiratory failure, 3 L home oxygen.  ?8. Hyponatremia on HCTZ ?9. Chronic diastolic CHF:  ?- Lexiscan Cardiolite in 2/19 was low risk no ischemia.  ?- Echo (1/21): EF 65-70%, no LVH, trivial MR, PASP 80 mmHg, severe RV enlargement with mildly decreased RV systolic function, D-shaped interventricular septum, dilated IVC.  ?- CTA chest (8/18): No evidence for PE.  ?10. Pulmonary hypertension: RHC/LHC 3/21 with mild CAD; mean RA 12, PA 68/25 mean 42, mean PCWP 9, CI 4.1, PVR 4.13 WU.  ?- Serologic workup negative.  ?- V/Q scan (3/21): No chronic PE.  ?- HRCT (3/21): No ILD, mild-moderate COPD. ?- PFTs (3/21): moderate-severe COPD ?- Echo (5/22): EF 60-65%, low normal RV function, mild RV enlargement, PASP 44 mmHg ?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  ?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 ? ?  Social History  ? ?Socioeconomic History  ? Marital status: Single  ?  Spouse name: Not on file  ? Number of children: 2  ? Years of education: Not on file  ? Highest education level: Not on file  ?Occupational History  ? Occupation: CELL  ?  Employer: RETIRED  ?Tobacco Use  ? Smoking status: Former  ?  Packs/day: 1.50  ?  Years: 20.00  ?  Pack years: 30.00  ?  Types: Cigarettes  ?  Quit date: 1999  ?  Years since quitting: 24.2  ? Smokeless tobacco: Never  ?Vaping Use  ? Vaping Use: Never used  ?Substance and Sexual Activity  ? Alcohol use: No  ?  Alcohol/week: 0.0 standard drinks  ? Drug use: No  ? Sexual activity:  Not Currently  ?Other Topics Concern  ? Not on file  ?Social History Narrative  ? Independent at baseline. Lives at home with her brother; in [place]. Quit smoking 20 years; no alcohol.   ? ?Social Determinants of Health  ? ?Financial Resource Strain: Not on file  ?Food Insecurity: Not on file  ?Transportation Needs: Not on file  ?Physical Activity: Not on file  ?Stress: Not on file  ?Social Connections: Not on file  ?Intimate Partner Violence: Not on file  ? ?Family History  ?Problem Relation Age of Onset  ? Prostate cancer Father   ? Other Sister   ?     mouth cancer  ? Hypertension Mother   ? ?ROS: All systems reviewed and negative except as per HPI.  ? ?Current Outpatient Medications  ?Medication Sig Dispense Refill  ? ACCU-CHEK AVIVA PLUS test strip 1 each 2 (two) times daily.    ? Accu-Chek Softclix Lancets lancets USE 1 TO CHECK GLUCOSE TWICE DAILY    ? acetaminophen (TYLENOL) 325 MG tablet Take 2 tablets (650 mg total) by mouth every 6 (six) hours as needed for mild pain (or Fever >/= 101).    ? aspirin 81 MG chewable tablet Chew 1 tablet (81 mg total) by mouth daily. 30 tablet 0  ? dapagliflozin propanediol (FARXIGA) 10 MG TABS tablet TAKE 1 TABLET BY MOUTH ONCE DAILY 90 tablet 3  ? ferrous gluconate (FERGON) 325 MG tablet Take 325 mg by mouth in the morning and at bedtime.     ? furosemide (LASIX) 40 MG tablet Take 40 mg by mouth daily.    ? imatinib (GLEEVEC) 100 MG tablet TAKE 4 TABLETS (400 MG TOTAL) BY MOUTH DAILY. TAKE WITH MEALS AND LARGE GLASS OF WATER 120 tablet 1  ? insulin glargine (LANTUS) 100 UNIT/ML injection Inject 32 Units into the skin in the morning.    ? latanoprost (XALATAN) 0.005 % ophthalmic solution Place 1 drop into both eyes at bedtime.     ? lovastatin (MEVACOR) 20 MG tablet Take 20 mg by mouth every evening.     ? metFORMIN (GLUCOPHAGE) 1000 MG tablet Take 1,000 mg by mouth daily.     ? Multiple Vitamin (MULTIVITAMIN) tablet Take 1 tablet by mouth daily.    ? omeprazole  (PRILOSEC) 40 MG capsule Take 40 mg by mouth daily.    ? OPSUMIT 10 MG tablet Take 1 tablet daily. 30 tablet 11  ? potassium chloride SA (KLOR-CON) 20 MEQ tablet Take 1 tablet (20 mEq total) by mouth daily. 90 tablet 3  ? tadalafil, PAH, (ALYQ) 20 MG tablet Take 2 tablets (40 mg total) by mouth daily. 60 tablet 11  ? timolol (TIMOPTIC) 0.5 %  ophthalmic solution Place 1 drop into both eyes 2 (two) times daily.     ? Tiotropium Bromide-Olodaterol (STIOLTO RESPIMAT) 2.5-2.5 MCG/ACT AERS INHALE 2 PUFFS BY MOUTH ONCE DAILY 4 g 3  ? HUMALOG KWIKPEN 100 UNIT/ML KiwkPen Inject 10 Units into the skin 2 (two) times daily.  (Patient not taking: Reported on 07/10/2021)    ? ?No current facility-administered medications for this encounter.  ? ?BP (!) 130/50   Pulse (!) 52   Wt 87.8 kg (193 lb 9.6 oz)   SpO2 100% Comment: 4 l N/C  BMI 34.85 kg/m?  ?General: NAD ?Neck: No JVD, no thyromegaly or thyroid nodule.  ?Lungs: Clear to auscultation bilaterally with normal respiratory effort. ?CV: Nondisplaced PMI.  Heart regular S1/S2, no S3/S4, no murmur.  No peripheral edema.  No carotid bruit.  Normal pedal pulses.  ?Abdomen: Soft, nontender, no hepatosplenomegaly, no distention.  ?Skin: Intact without lesions or rashes.  ?Neurologic: Alert and oriented x 3.  ?Psych: Normal affect. ?Extremities: No clubbing or cyanosis.  ?HEENT: Normal.  ? ?Assessment/Plan: ?1. RV failure: Echo in 1/21 showed significant RV dysfunction with severe pulmonary hypertension and D-shaped interventricular septum.  Based on RHC, it looks like RV failure is due to pulmonary arterial hypertension rather than LV failure (normal PCWP).  Repeated echo in 5/22 showed EF 60-65%, low normal RV function, mild RV enlargement, PASP 44 mmHg. She is not significantly volume overloaded on exam today.   ?- Continue current Lasix.  Check BMET/BNP.   ?- Continue [place].  ?- Repeat echo at followup in 3 months.  ?2. Pulmonary HTN: Echo in 1/21 with RV failure and estimate  PA systolic pressure 80 mmHg.  RHC in 3/21 showed pulmonary arterial hypertension.  Serologic workup was negative.  V/Q scan showed no chronic PE.  HRCT showed no ILD, mild-moderate emphysema.  PFTs suggestive of

## 2021-07-16 ENCOUNTER — Other Ambulatory Visit (HOSPITAL_COMMUNITY): Payer: Self-pay

## 2021-07-26 ENCOUNTER — Inpatient Hospital Stay: Payer: Medicare Other | Admitting: Medical Oncology

## 2021-07-26 ENCOUNTER — Inpatient Hospital Stay: Payer: Medicare Other

## 2021-07-26 ENCOUNTER — Inpatient Hospital Stay: Payer: Medicare Other | Attending: Internal Medicine

## 2021-07-26 ENCOUNTER — Encounter: Payer: Self-pay | Admitting: Medical Oncology

## 2021-07-26 VITALS — BP 119/56 | HR 65 | Temp 98.7°F | Resp 19 | Wt 192.6 lb

## 2021-07-26 DIAGNOSIS — C49A3 Gastrointestinal stromal tumor of small intestine: Secondary | ICD-10-CM

## 2021-07-26 DIAGNOSIS — J449 Chronic obstructive pulmonary disease, unspecified: Secondary | ICD-10-CM | POA: Diagnosis not present

## 2021-07-26 DIAGNOSIS — I509 Heart failure, unspecified: Secondary | ICD-10-CM | POA: Insufficient documentation

## 2021-07-26 DIAGNOSIS — Z7984 Long term (current) use of oral hypoglycemic drugs: Secondary | ICD-10-CM | POA: Insufficient documentation

## 2021-07-26 DIAGNOSIS — Z79899 Other long term (current) drug therapy: Secondary | ICD-10-CM | POA: Diagnosis not present

## 2021-07-26 DIAGNOSIS — I10 Essential (primary) hypertension: Secondary | ICD-10-CM | POA: Insufficient documentation

## 2021-07-26 DIAGNOSIS — I272 Pulmonary hypertension, unspecified: Secondary | ICD-10-CM | POA: Insufficient documentation

## 2021-07-26 DIAGNOSIS — K219 Gastro-esophageal reflux disease without esophagitis: Secondary | ICD-10-CM | POA: Diagnosis not present

## 2021-07-26 DIAGNOSIS — E119 Type 2 diabetes mellitus without complications: Secondary | ICD-10-CM | POA: Insufficient documentation

## 2021-07-26 DIAGNOSIS — L989 Disorder of the skin and subcutaneous tissue, unspecified: Secondary | ICD-10-CM

## 2021-07-26 DIAGNOSIS — E611 Iron deficiency: Secondary | ICD-10-CM

## 2021-07-26 DIAGNOSIS — D509 Iron deficiency anemia, unspecified: Secondary | ICD-10-CM | POA: Diagnosis not present

## 2021-07-26 DIAGNOSIS — E785 Hyperlipidemia, unspecified: Secondary | ICD-10-CM | POA: Diagnosis not present

## 2021-07-26 LAB — CBC WITH DIFFERENTIAL/PLATELET
Abs Immature Granulocytes: 0.01 10*3/uL (ref 0.00–0.07)
Basophils Absolute: 0 10*3/uL (ref 0.0–0.1)
Basophils Relative: 1 %
Eosinophils Absolute: 0 10*3/uL (ref 0.0–0.5)
Eosinophils Relative: 0 %
HCT: 36.4 % (ref 36.0–46.0)
Hemoglobin: 12 g/dL (ref 12.0–15.0)
Immature Granulocytes: 0 %
Lymphocytes Relative: 16 %
Lymphs Abs: 0.8 10*3/uL (ref 0.7–4.0)
MCH: 30.8 pg (ref 26.0–34.0)
MCHC: 33 g/dL (ref 30.0–36.0)
MCV: 93.3 fL (ref 80.0–100.0)
Monocytes Absolute: 0.3 10*3/uL (ref 0.1–1.0)
Monocytes Relative: 7 %
Neutro Abs: 3.7 10*3/uL (ref 1.7–7.7)
Neutrophils Relative %: 76 %
Platelets: 187 10*3/uL (ref 150–400)
RBC: 3.9 MIL/uL (ref 3.87–5.11)
RDW: 14.6 % (ref 11.5–15.5)
WBC: 4.9 10*3/uL (ref 4.0–10.5)
nRBC: 0 % (ref 0.0–0.2)

## 2021-07-26 LAB — COMPREHENSIVE METABOLIC PANEL
ALT: 15 U/L (ref 0–44)
AST: 19 U/L (ref 15–41)
Albumin: 3.9 g/dL (ref 3.5–5.0)
Alkaline Phosphatase: 53 U/L (ref 38–126)
Anion gap: 7 (ref 5–15)
BUN: 24 mg/dL — ABNORMAL HIGH (ref 8–23)
CO2: 27 mmol/L (ref 22–32)
Calcium: 9.1 mg/dL (ref 8.9–10.3)
Chloride: 103 mmol/L (ref 98–111)
Creatinine, Ser: 0.93 mg/dL (ref 0.44–1.00)
GFR, Estimated: >60 mL/min (ref >60)
Glucose, Bld: 112 mg/dL — ABNORMAL HIGH (ref 70–99)
Potassium: 4.2 mmol/L (ref 3.5–5.1)
Sodium: 137 mmol/L (ref 135–145)
Total Bilirubin: 0.4 mg/dL (ref 0.3–1.2)
Total Protein: 7.2 g/dL (ref 6.5–8.1)

## 2021-07-26 LAB — IRON AND TIBC
Iron: 64 ug/dL (ref 28–170)
Saturation Ratios: 21 % (ref 10.4–31.8)
TIBC: 300 ug/dL (ref 250–450)
UIBC: 236 ug/dL

## 2021-07-26 LAB — FERRITIN: Ferritin: 63 ng/mL (ref 11–307)

## 2021-07-26 NOTE — Progress Notes (Signed)
[facility] ?Office Visit Note ? ?Patient Care Team: ?Albina Billet, MD as PCP - General (Unknown Physician Specialty) ?Wellington Hampshire, MD as PCP - Cardiology (Cardiology) ?Christene Lye, MD (General Surgery) ?Albina Billet, MD (Internal Medicine) ?Schnier, Dolores Lory, MD (Vascular Surgery) ?Wilford Corner, MD as Consulting Physician (Gastroenterology) ?Cammie Sickle, MD as Consulting Physician (Hematology and Oncology) ? ?CHIEF COMPLAINTS/PURPOSE OF CONSULTATION: GIST ? ? ?HEMATOLOGY HISTORY ? ?Oncology History Overview Note  ?NOV 2021- CT Bx- TUMOR  ?Tumor Site: Peritoneum, jejunal  ?Histologic Type: Gastrointestinal stromal tumor, spindle cell type  ?Mitotic Rate: 1 per 5 mm squared  ?Histologic Grade: G1, low grade (mitotic rate less than or equal to 5  ?per 5 mm squared)- STAGE T2N1 [possible mesneteric LN]; CT A/P-9.8 x9.8 cm ? ?# NOV 29th 2021- GLEEVEC 400 mg/day neoadjuvant therapy 6 months.  ? ?SURGICAL PATHOLOGY  ?CASE: [id number]  ?PATIENT: Susan Barajas  ?Surgical Pathology Report  ? ? ? ? ?Specimen Submitted:  ?A. Lymph node, mesenteric  ?B. Lymph node, Proximal mesenteric  ?C. Small bowel with GIST  ? ?Clinical History: GIST small bowel  ? ? ? ? ? ?DIAGNOSIS:  ?A. LYMPH NODE, MESENTERIC; EXCISIONAL BIOPSY:  ?- REACTIVE LYMPHOID FOLLICULAR HYPERPLASIA WITH PROGRESSIVE  ?TRANSFORMATION OF GERMINAL CENTERS.  ?- NEGATIVE FOR MALIGNANCY, ONE LYMPH NODE (0/1).  ? ?B. LYMPH NODE, PROXIMAL MESENTERIC; EXCISIONAL BIOPSY:  ?- REACTIVE LYMPHOID FOLLICULAR HYPERPLASIA.  ?- NEGATIVE FOR MALIGNANCY, TWO LYMPH NODES (0/2).  ? ?C. SMALL BOWEL WITH GIST; RESECTION:  ?- GASTROINTESTINAL STROMAL TUMOR.  ?- SEE CANCER SUMMARY BELOW.  ? ?CASE SUMMARY: (GASTROINTESTINAL STROMAL TUMOR (GIST): Resection)  ?Standard(s): AJCC-UICC 8  ? ?CLINICAL  ?Pre-resection Treatment: Previous biopsy, systemic therapy performed  ?([place])  ? ?SPECIMEN  ?Procedure: Resection (partial small bowel)   ? ?TUMOR  ?Tumor Focality:  Unifocal  ?Tumor Site: Jejunum  ?Tumor Size: 9.7 x 9.2 x 6.3 cm  ?Histologic Type: Gastrointestinal stromal tumor, spindle cell type  ?Mitotic Rate:  Specify mitotic rate per 5 mm squared: Less than 5 per 5  ?mm squared  ?Histologic Grade: G1, low grade  ?Necrosis: Not identified  ?Treatment Effect: Present, approximately 30% viable tumor remains  ?Risk Assessment: Moderate (24%) risk of progressive disease  ? ?MARGINS:  ?Margin status:  ?Margin(s) involved by GIST: Deep, see comment.  ? ?Comment:  ?Tumor is present on ink on the periphery of the tumor nodule.  The  ?nature of the specimen precludes accurate assessment of where the tumor  ?may have been attached in situ (apart from the small bowel attachment).  ?Proximal and distal bowel margins cannot be reliably assessed, given  ?that they were received detached from the tumor specimen.  ? ?REGIONAL LYMPH NODES  ?Regional lymph node status:  ?Regional lymph nodes present, all regional lymph nodes negative for  ?tumor.  ?Number of lymph nodes examined: Three ?  ? ?# CHRONIC ANEMIA EGD/capsule-WNL-2018 [Dr.vanga]-UGIB ; colonoscopy-2018 [[place]] ; capsule-not done; hemoglobin around 8.8; iron saturation 5% [Dr.Tate].  Poor tolerance of p.o. iron; [Dr.Armbruster; [facility] July 2021]-high risk of anesthesia; on IV venofer ? ?# CHF/[Dr.Mclaheny]; COPD [Dr.Kasa]- on Home 4-6lit/ O2 ? ?# SURVIVORSHIP:  ? ?# GENETICS:  ? 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 ?DIAGNOSIS: GIST ? ?STAGE: T3 N0 vs  T3N1     ? ? ?. ?  ?GIST (gastrointestinal stromal tumor) of small bowel, malignant (HCC)  ?  03/12/2020 Initial Diagnosis  ? GIST (gastrointestinal stromal tumor) of small bowel, malignant ([place]) ?  ? ? ?HISTORY OF PRESENTING ILLNESS: Alone; ambulating independently. 6 L of oxygen nasal cannula.   ? ?Susan Susan Barajas 76 y.o.  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 ? ?Patient reports that she is doing well. Fatigue is stable to improved and she does not have any current edema. She continues to wear compression stockings.  Intermittently on diuretics. Denies any nausea vomiting from the [place]. She does question if a skin change on the back of her neck may be from her [place]. It does not bleed, itch or hurt. Has been present for about 6 months.  ? ?Review of Systems  ?Constitutional:  Positive for malaise/fatigue. Negative for chills, diaphoresis, fever and weight loss.  ?HENT:  Negative for nosebleeds and sore throat.   ?Eyes:  Negative for double vision.  ?Respiratory:  Positive for shortness of breath. Negative for cough, hemoptysis, sputum production and wheezing.   ?Cardiovascular:  Negative for chest pain, palpitations, orthopnea and leg swelling.  ?Gastrointestinal:  Negative for abdominal pain, blood in stool, constipation, diarrhea, heartburn, melena, nausea and vomiting.  ?Genitourinary:  Negative for dysuria, frequency and urgency.  ?Musculoskeletal:  Negative for back pain and joint pain.  ?Skin:  Positive for rash. Negative for itching.  ?Neurological:  Negative for dizziness, tingling, focal weakness, weakness and headaches.  ?Endo/Heme/Allergies:  Does not bruise/bleed easily.  ?Psychiatric/Behavioral:  Negative for depression. The patient is not nervous/anxious and does not have insomnia.   ? ?MEDICAL HISTORY:  ?Past Medical History:  ?Diagnosis Date  ? Anemia   ? Asthma 1950  ? CHF (congestive heart failure) ([place])   ? COPD (chronic obstructive pulmonary disease) ([place])   ? Cor pulmonale (HCC)   ? DVT (deep venous thrombosis) ([place]) 2018  ? GERD (gastroesophageal reflux disease)   ? GIST (gastrointestinal stromal tumor) of small bowel, malignant ([place])   ? Glaucoma 1949  ? Hyperlipidemia   ? Hypertension   ? Obesity, unspecified   ?  OSA treated with BiPAP   ? Personal history of tobacco use, presenting hazards to health   ? Pneumonia   ? Presence of IVC filter   ? Pulmonary HTN ([place])   ? Supplemental oxygen dependent   ? T2DM (type 2 diabetes mellitus) ([place])   ? Varicose veins of lower extremities with other complications   ? ? ?SURGICAL HISTORY: ?Past Surgical History:  ?Procedure Laterality Date  ? BREAST BIOPSY Right 1991  ? BREAST BIOPSY Left 2013  ? COLONOSCOPY  2010  ? Dr. Vira Agar  ? COLONOSCOPY  Jan 2016  ? Dr Vira Agar  ? ESOPHAGOGASTRODUODENOSCOPY N/A 11/26/2016  ? Procedure: ESOPHAGOGASTRODUODENOSCOPY (EGD);  Surgeon: Lin Landsman, MD;  [facility];  Service: Gastroenterology;  Laterality: N/A;  ? GIVENS CAPSULE STUDY N/A 11/26/2016  ? Procedure: GIVENS CAPSULE STUDY, if EGD negative, plan to drop capsule with scope if EGD negative;  Surgeon: Lin Landsman, MD;  Location: [facility] ENDOSCOPY;  Service: Gastroenterology;  Laterality: N/A;  ? IVC FILTER INSERTION N/A 11/14/2016  ? Procedure: IVC Filter Insertion;  Surgeon: Katha Cabal, MD;  Location: [facility] CV LAB;  Service: Cardiovascular;  Laterality: N/A;  ? PARTIAL COLECTOMY N/A 11/05/2020  ? Procedure: SMALL BOWEL RESECTION, REMOVAL OF GIST TUMOR AND LYMPH NODES, UMBILICAL HERNIA  REPAIR;  Surgeon: Robert Bellow, MD;  Location: [facility] ORS;  Service: General;  Laterality: N/A;  small bowel resection, RNFA to assist; TAPP block per anesthesia  ? RIGHT/LEFT HEART CATH AND CORONARY ANGIOGRAPHY N/A 06/30/2019  ? Procedure: RIGHT/LEFT HEART CATH AND CORONARY ANGIOGRAPHY;  Surgeon: Larey Dresser, MD;  Location: [facility] CV LAB;  Service: Cardiovascular;  Laterality: N/A;  ? VEIN SURGERY Right 2006  ? Vein Closure Procedure; RF ablation of right GSV  ? ? ?SOCIAL HISTORY: ?Social History  ? ?Socioeconomic History  ? Marital status: Single  ?  Spouse name: Not on file  ? Number of children: 2  ? Years of education: Not on file  ? Highest education level:  Not on file  ?Occupational History  ? Occupation: CELL  ?  Employer: RETIRED  ?Tobacco Use  ? Smoking status: Former  ?  Packs/day: 1.50  ?  Years: 20.00  ?  Pack years: 30.00  ?  Types: Cigarettes  ?  Quit Training and development officer

## 2021-08-06 ENCOUNTER — Other Ambulatory Visit (HOSPITAL_COMMUNITY): Payer: Self-pay

## 2021-08-06 ENCOUNTER — Other Ambulatory Visit: Payer: Self-pay | Admitting: Internal Medicine

## 2021-08-06 DIAGNOSIS — C49A3 Gastrointestinal stromal tumor of small intestine: Secondary | ICD-10-CM

## 2021-08-06 MED ORDER — IMATINIB MESYLATE 100 MG PO TABS
ORAL_TABLET | ORAL | 1 refills | Status: DC
  Filled 2021-08-06: qty 120, 30d supply, fill #0
  Filled 2021-09-04: qty 120, 30d supply, fill #1

## 2021-08-06 NOTE — Telephone Encounter (Signed)
CBC with Differential/Platelet ?Order: [id number] ?Status: Final result    ?Visible to patient: No (inaccessible in MyChart)    ?Next appt: 11/22/2021 at 11:30 AM in Oncology ([facility])    ?Dx: GIST (gastrointestinal stromal tumor)...    ?0 Result Notes ?          ?Component Ref Range & Units 11 d ago ?(07/26/21) 3 mo ago ?(04/26/21) 6 mo ago ?(01/25/21) 8 mo ago ?(11/23/20) 8 mo ago ?(11/10/20) 9 mo ago ?(11/09/20) 9 mo ago ?(11/08/20)  ?WBC 4.0 - 10.5 K/uL 4.9  5.2  9.8  6.8  6.3  7.5  8.8   ?RBC 3.87 - 5.11 MIL/uL 3.90  3.64 Low   3.83 Low   3.40 Low   2.93 Low   2.84 Low   3.02 Low    ?Hemoglobin 12.0 - 15.0 g/dL 12.0  11.2 Low   11.1 Low   10.0 Low   8.4 Low   8.6 Low   9.1 Low    ?HCT 36.0 - 46.0 % 36.4  34.3 Low   33.5 Low   31.6 Low   26.9 Low   26.9 Low   28.4 Low    ?MCV 80.0 - 100.0 fL 93.3  94.2  87.5  92.9  91.8  94.7  94.0   ?MCH 26.0 - 34.0 pg 30.8  30.8  29.0  29.4  28.7  30.3  30.1   ?MCHC 30.0 - 36.0 g/dL 33.0  32.7  33.1  31.6  31.2  32.0  32.0   ?RDW 11.5 - 15.5 % 14.6  14.4  15.5  16.2 High   15.4  16.0 High   16.5 High    ?Platelets 150 - 400 K/uL 187  201  268  342  220  200  207   ?nRBC 0.0 - 0.2 % 0.0  0.0  0.0  0.0  0.0 CM  0.0 CM  0.0 CM   ?Neutrophils Relative % % 76  79  85  83      ?Neutro Abs 1.7 - 7.7 K/uL 3.7  4.1  8.2 High   5.6      ?Lymphocytes Relative % _0 ?Lymphs Abs 0.7 - 4.0 K/uL 0.8  0.7  0.8  0.7      ?Monocytes Relative % _1 ?Monocytes Absolute 0.1 - 1.0 K/uL 0.3  0.4  0.6  0.5      ?Eosinophils Relative % 0  0  0  0      ?Eosinophils Absolute 0.0 - 0.5 K/uL 0.0  0.0  0.0  0.0      ?Basophils Relative % 1  0  0  0      ?Basophils Absolute 0.0 - 0.1 K/uL 0.0  0.0  0.0  0.0      ?Immature Granulocytes % 0  0  1  0      ?Abs Immature Granulocytes 0.00 - 0.07 K/uL 0.01  0.01 CM  0.08 High  CM  0.01 CM      ?Comment: Performed at [facility], [address] [place], [place] [address]  ?Resulting Agency  [facility] [place] [facility] [place]  [facility] [place] [facility] [place] [facility]  ?  ? ?  ?  ?Specimen Collected: 07/26/21 12:42 Last Resulted: 07/26/21 12:55  ?  ?  Lab Flowsheet   ? Order Details   ? View Encounter   ? Lab and Collection Details   ? Routing   ? Result History    ?View Encounter Conversation    ?  ?CM=Additional comments    ?  ?Result Care Coordination ? ? ?Patient Communication ? ? Add Comments   Not seen Back to Top  ?  ?  ? ?Other Results from 07/26/2021 ? ?Iron and TIBC([facility]) ?Order: [id number] ?Status: Final result    ?Visible to patient: No (inaccessible in MyChart)    ?Next appt: 11/22/2021 at 11:30 AM in Oncology ([facility])    ?Dx: GIST (gastrointestinal stromal tumor)...    ?0 Result Notes ?          ?Component Ref Range & Units 11 d ago ?(07/26/21) 3 mo ago ?(04/26/21) 6 mo ago ?(01/25/21) 1 yr ago ?(06/05/20) 1 yr ago ?(02/10/20) 1 yr ago ?(08/15/19) 4 yr ago ?(11/25/16)  ?Iron 28 - 170 ug/dL 64  37  49  38  48  11 Low   55   ?TIBC 250 - 450 ug/dL 300  283  244 Low   361  381  448  351   ?Saturation Ratios 10.4 - 31.8 % _0 Low   16   ?UIBC ug/dL 236  246 CM  195 CM  323 CM  333 CM  437 CM  296   ?Comment: Performed at [facility], [address] [place], [place] [address]  ?Resulting Agency  _1  [facility] [place] [facility]  ?  ? ?  ?  ?Specimen Collected: 07/26/21 12:42 Last Resulted: 07/26/21 14:00  ?  ?  Lab Flowsheet   ? Order Details   ? View Encounter   ? Lab and Collection Details   ? Routing   ? Result History    ?View Encounter Conversation    ?  ?CM=Additional comments    ?  ?Result Care Coordination ? ? ?Patient Communication ? ? Add Comments   Not seen Back to Top  ?  ?  ? ?  ? Contains abnormal data Comprehensive metabolic panel ?Order: [id number] ?Status: Final result    ?Visible to patient: No (inaccessible in MyChart)    ?Next appt: 11/22/2021 at 11:30 AM in Oncology ([facility])    ?Dx: GIST (gastrointestinal stromal  tumor)...    ?0 Result Notes ?          ?Component Ref Range & Units 11 d ago ?(07/26/21) 3 wk ago ?(07/10/21) 3 mo ago ?(04/26/21) 4 mo ago ?(03/12/21) 6 mo ago ?(01/25/21) 8 mo ago ?(11/23/20) 8 mo ago ?(11/10/20)  ?Sodium 135 - 145 mmol/L 137  138  131 Low   137  133 Low   140  140   ?Potassium 3.5 - 5.1 mmol/L 4.2  4.6  4.2  3.8  3.8  3.5  3.8   ?Chloride 98 - 111 mmol/L 103  107  101  107  103  103  110   ?CO2 22 - 32 mmol/L _2 ?Glucose, Bld 70 - 99 mg/dL 112 High   87 CM  156 High  CM  96 CM  144 High  CM  106 High  CM  99 CM   ?Comment: Glucose reference  range applies only to samples taken after fasting for at least 8 hours.  ?BUN 8 - 23 mg/dL 24 High   _0 ?Creatinine, Ser 0.44 - 1.00 mg/dL 0.93  0.93  0.86  0.82  0.77  0.72  0.50   ?Calcium 8.9 - 10.3 mg/dL 9.1  9.0  8.4 Low   8.6 Low   8.3 Low   8.7 Low   8.4 Low    ?Total Protein 6.5 - 8.1 g/dL 7.2   7.3   6.4 Low   6.9    ?Albumin 3.5 - 5.0 g/dL 3.9   3.8   2.9 Low   3.4 Low     ?AST 15 - 41 U/L _1 Low     ?ALT 0 - 44 U/L _2 ?Alkaline Phosphatase 38 - 126 U/L 53   57   61  46    ?Total Bilirubin 0.3 - 1.2 mg/dL 0.4   0.3   0.2 Low   0.7    ?GFR, Estimated >60 mL/min >60  >60 CM  >60 CM  >60 CM  >60 CM  >60 CM  >60 CM   ?Comment: (NOTE)  ?Calculated using the CKD-EPI Creatinine Equation (2021)   ?Anion gap 5 - _3 CM  5 CM  6 CM  5 CM  10 CM  4 Low  CM   ?Comment: Performed at [facility], [address], [place], [place] [address]  ?Resulting Agency  _4  [facility]  ?  ? ?  ?  ?Specimen Collected: 07/26/21 12:42 Last Resulted: 07/26/21 13:07  ?  ?  ? ?

## 2021-08-12 ENCOUNTER — Other Ambulatory Visit (HOSPITAL_COMMUNITY): Payer: Self-pay

## 2021-09-02 ENCOUNTER — Other Ambulatory Visit (HOSPITAL_COMMUNITY): Payer: Self-pay | Admitting: Cardiology

## 2021-09-02 DIAGNOSIS — I272 Pulmonary hypertension, unspecified: Secondary | ICD-10-CM

## 2021-09-04 ENCOUNTER — Other Ambulatory Visit (HOSPITAL_COMMUNITY): Payer: Self-pay

## 2021-09-11 ENCOUNTER — Other Ambulatory Visit (HOSPITAL_COMMUNITY): Payer: Self-pay

## 2021-09-25 ENCOUNTER — Other Ambulatory Visit: Payer: Self-pay

## 2021-09-25 MED ORDER — STIOLTO RESPIMAT 2.5-2.5 MCG/ACT IN AERS: INHALATION_SPRAY | RESPIRATORY_TRACT | 3 refills | Status: DC

## 2021-10-04 ENCOUNTER — Other Ambulatory Visit (HOSPITAL_COMMUNITY): Payer: Self-pay

## 2021-10-07 ENCOUNTER — Other Ambulatory Visit: Payer: Self-pay | Admitting: Internal Medicine

## 2021-10-07 ENCOUNTER — Other Ambulatory Visit (HOSPITAL_COMMUNITY): Payer: Self-pay

## 2021-10-07 DIAGNOSIS — C49A3 Gastrointestinal stromal tumor of small intestine: Secondary | ICD-10-CM

## 2021-10-07 MED ORDER — IMATINIB MESYLATE 100 MG PO TABS
ORAL_TABLET | ORAL | 1 refills | Status: DC
  Filled 2021-10-07: qty 120, 30d supply, fill #0
  Filled 2021-11-07: qty 120, 30d supply, fill #1

## 2021-10-09 ENCOUNTER — Other Ambulatory Visit (HOSPITAL_COMMUNITY): Payer: Self-pay

## 2021-10-31 ENCOUNTER — Other Ambulatory Visit (HOSPITAL_COMMUNITY): Payer: Self-pay

## 2021-11-01 ENCOUNTER — Other Ambulatory Visit (HOSPITAL_COMMUNITY): Payer: Self-pay | Admitting: Internal Medicine

## 2021-11-01 ENCOUNTER — Other Ambulatory Visit (HOSPITAL_COMMUNITY): Payer: Self-pay | Admitting: *Deleted

## 2021-11-01 DIAGNOSIS — I50810 Right heart failure, unspecified: Secondary | ICD-10-CM

## 2021-11-07 ENCOUNTER — Other Ambulatory Visit (HOSPITAL_COMMUNITY): Payer: Self-pay

## 2021-11-19 ENCOUNTER — Ambulatory Visit (HOSPITAL_COMMUNITY)
Admission: RE | Admit: 2021-11-19 | Discharge: 2021-11-19 | Disposition: A | Discharge status: Home / Self Care | Payer: Medicare Other | Source: Ambulatory Visit | Attending: Cardiology | Admitting: Cardiology

## 2021-11-19 ENCOUNTER — Ambulatory Visit (HOSPITAL_BASED_OUTPATIENT_CLINIC_OR_DEPARTMENT_OTHER)
Admission: RE | Admit: 2021-11-19 | Discharge: 2021-11-19 | Disposition: A | Discharge status: Home / Self Care | Payer: Medicare Other | Source: Ambulatory Visit | Attending: Cardiology | Admitting: Cardiology

## 2021-11-19 ENCOUNTER — Encounter (HOSPITAL_COMMUNITY): Payer: Self-pay | Admitting: Cardiology

## 2021-11-19 VITALS — BP 110/50 | HR 60 | Wt 199.8 lb

## 2021-11-19 DIAGNOSIS — I110 Hypertensive heart disease with heart failure: Secondary | ICD-10-CM | POA: Diagnosis not present

## 2021-11-19 DIAGNOSIS — J9611 Chronic respiratory failure with hypoxia: Secondary | ICD-10-CM | POA: Insufficient documentation

## 2021-11-19 DIAGNOSIS — E119 Type 2 diabetes mellitus without complications: Secondary | ICD-10-CM | POA: Diagnosis not present

## 2021-11-19 DIAGNOSIS — Z86718 Personal history of other venous thrombosis and embolism: Secondary | ICD-10-CM | POA: Diagnosis not present

## 2021-11-19 DIAGNOSIS — I340 Nonrheumatic mitral (valve) insufficiency: Secondary | ICD-10-CM | POA: Diagnosis not present

## 2021-11-19 DIAGNOSIS — Z95828 Presence of other vascular implants and grafts: Secondary | ICD-10-CM | POA: Insufficient documentation

## 2021-11-19 DIAGNOSIS — I272 Pulmonary hypertension, unspecified: Secondary | ICD-10-CM

## 2021-11-19 DIAGNOSIS — J439 Emphysema, unspecified: Secondary | ICD-10-CM | POA: Insufficient documentation

## 2021-11-19 DIAGNOSIS — Z7984 Long term (current) use of oral hypoglycemic drugs: Secondary | ICD-10-CM | POA: Diagnosis not present

## 2021-11-19 DIAGNOSIS — I50810 Right heart failure, unspecified: Secondary | ICD-10-CM

## 2021-11-19 DIAGNOSIS — I5032 Chronic diastolic (congestive) heart failure: Secondary | ICD-10-CM | POA: Diagnosis not present

## 2021-11-19 DIAGNOSIS — Z9981 Dependence on supplemental oxygen: Secondary | ICD-10-CM | POA: Insufficient documentation

## 2021-11-19 DIAGNOSIS — I2510 Atherosclerotic heart disease of native coronary artery without angina pectoris: Secondary | ICD-10-CM | POA: Insufficient documentation

## 2021-11-19 DIAGNOSIS — Z79899 Other long term (current) drug therapy: Secondary | ICD-10-CM | POA: Insufficient documentation

## 2021-11-19 DIAGNOSIS — G4733 Obstructive sleep apnea (adult) (pediatric): Secondary | ICD-10-CM | POA: Insufficient documentation

## 2021-11-19 LAB — BASIC METABOLIC PANEL
Anion gap: 7 (ref 5–15)
BUN: 25 mg/dL — ABNORMAL HIGH (ref 8–23)
CO2: 24 mmol/L (ref 22–32)
Calcium: 8.9 mg/dL (ref 8.9–10.3)
Chloride: 109 mmol/L (ref 98–111)
Creatinine, Ser: 1.11 mg/dL — ABNORMAL HIGH (ref 0.44–1.00)
GFR, Estimated: 52 mL/min — ABNORMAL LOW (ref >60)
Glucose, Bld: 136 mg/dL — ABNORMAL HIGH (ref 70–99)
Potassium: 3.7 mmol/L (ref 3.5–5.1)
Sodium: 140 mmol/L (ref 135–145)

## 2021-11-19 LAB — BRAIN NATRIURETIC PEPTIDE: B Natriuretic Peptide: 57.4 pg/mL (ref 0.0–100.0)

## 2021-11-19 LAB — ECHOCARDIOGRAM COMPLETE
Area-P 1/2: 4.15 cm2
S' Lateral: 3.1 cm

## 2021-11-19 NOTE — Patient Instructions (Signed)
There has been no changes to your medications.  Labs done today, your results will be available in MyChart, we will contact you for abnormal readings.  Your physician recommends that you schedule a follow-up appointment in: 4 months (December 2023)  ** please call the office in October to arrange your follow up appointment **  If you have any questions or concerns before your next appointment please send [name] a message through [place] or call our office at [phone].    TO LEAVE A MESSAGE FOR THE NURSE SELECT OPTION 2, PLEASE LEAVE A MESSAGE INCLUDING: YOUR NAME DATE OF BIRTH CALL BACK NUMBER REASON FOR CALL**this is important as we prioritize the call backs  YOU WILL RECEIVE A CALL BACK THE SAME DAY AS LONG AS YOU CALL BEFORE 4:00 PM  At the [facility], you and your health needs are our priority. As part of our continuing mission to provide you with exceptional heart care, we have created designated Provider Care Teams. These Care Teams include your primary Cardiologist (physician) and Advanced Practice Providers (APPs- Physician Assistants and Nurse Practitioners) who all work together to provide you with the care you need, when you need it.   You may see any of the following providers on your designated Care Team at your next follow up: Dr Glori Bickers Dr Haynes Kerns, NP Lyda Jester, [place] [facility] [place], [place] Audry Riles, PharmD   Please be sure to bring in all your medications bottles to every appointment.

## 2021-11-19 NOTE — Progress Notes (Signed)
PCP: Albina Billet, MD Cardiology: Dr. Fletcher Anon HF Cardiology: Dr. Aundra Dubin  76 y.o. with history of chronic hypoxemic respiratory failure, OSA, and chronic diastolic CHF was referred by Dr. Fletcher Anon for evaluation of CHF and pulmonary hypertension.  Patient has been on home oxygen for 2-3 years per her report.  She was a smoker remotely. She had a DVT in 2018.  Anticoagulation was stopped due to refractory anemia and IVC filter was placed.  CT chest did not show a PE. She has OSA and uses CPAP.  Last echo in 1/21 showed normal LV EF with PASP 80 and severely dilated RV with mild RV dysfunction and D-shaped septum. She has been on Lasix for diuresis due to RV failure.    RHC/LHC was done in 3/21, showing mild CAD; mean RA 12, PA 68/25 mean 42, mean PCWP 9, CI 4.1, PVR 4.13 WU.  V/Q scan in 3/21 did not show evidence for chronic PE.  High resolution chest CT in 3/21 did not show evidence for ILD, it did show mild-moderate emphysema. PFTs (3/21) showed moderate-severe COPD.   She has been diagnosed with GI stromal tumor and is now on [place].   She was unable to tolerate Tyvaso due to intractable nausea and vomiting.   Echo 5/22 showed EF 60-65%, low normal RV function, mild RV enlargement, PASP 44 mmHg.  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   Echo was done today and reviewed, EF 55-60%, RV mildly enlarged with normal systolic function, moderate LAE,  normal IVC, PASP 35 mmHg.   She returns for followup of pulmonary hypertension and RV failure.  Wearing home oxygen, 4L at rest and 6L with exertion, uses Bipap at night. She is short of breath with long walks, does ok with shorter distances.  No chest pain.  No lightheadedness. Weight up about 6 lbs.   ECG (personally reviewed): NSR, PACs, poor RWP   6 minute walk (3/21): 182 m 6 minute walk (5/21): 243 m, oxygen saturation dropped to as low as 70s 6 minute walk (7/21): Only walked 3 min bc oxygen saturation dropped to 70s and CMA stopped  walk, she went 121 m.  6 minute walk (3/22): 298 m 6 minute walk (3/23): 64 m with her home oxygen.   Labs (12/20): BNP 819 Labs (2/21): K 4.4, creatinine 0.72 Labs (3/21): RF normal, ANA negative, anti-Jo-1 negative, anti-SCL-70 negative Labs (5/21): K 4.3, creatinine 0.88 Labs (6/21): hgb 10 Labs (3/22): BNP 89 Labs (4/22): K 3.9, creatinine 0.93 Labs (8/22): K 3.5, creatinine 0.72 Labs (10/22): K 4.8, creatinine 0.77, hgb 11.1 Labs (1/23): K 4.2, creatinine 0.86 Labs (4/23): K 4.2, creatinine 0.93, hgb 12  PMH: 1. COVID-19 PNA 8/20.  2. Type 2 diabetes 3. DVT: right leg, 2018.  - Has IVC filter, not anticoagulated due to h/o anemia.  4. HTN 5. Hyperlipidemia.  6. OSA: Uses Bipap.  7. COPD: Chronic hypoxemic respiratory failure, 3 L home oxygen.  8. Hyponatremia on HCTZ 9. Chronic diastolic CHF:  - Lexiscan Cardiolite in 2/19 was low risk no ischemia.  - Echo (1/21): EF 65-70%, no LVH, trivial MR, PASP 80 mmHg, severe RV enlargement with mildly decreased RV systolic function, D-shaped interventricular septum, dilated IVC.  - CTA chest (8/18): No evidence for PE.  10. Pulmonary hypertension: RHC/LHC 3/21 with mild CAD; mean RA 12, PA 68/25 mean 42, mean PCWP 9, CI 4.1, PVR 4.13 WU.  - Serologic workup negative.  - V/Q scan (3/21): No chronic PE.  -  HRCT (3/21): No ILD, mild-moderate COPD. - PFTs (3/21): moderate-severe COPD - Echo (5/22): EF 60-65%, low normal RV function, mild RV enlargement, PASP 44 mmHg - Echo (8/23): EF 55-60%, RV mildly enlarged with normal systolic function, moderate LAE,  normal IVC, PASP 35 mmHg.  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  Social History   Socioeconomic History   Marital status: Single    Spouse name: Not on file   Number of children: 2   Years of education: Not on file   Highest education level: Not on file  Occupational History   Occupation: CELL    Employer: RETIRED  Tobacco Use    Smoking status: Former    Packs/day: 1.50    Years: 20.00    Total pack years: 30.00    Types: Cigarettes    Quit date: 1999    Years since quitting: 24.6   Smokeless tobacco: Never  Vaping Use   Vaping Use: Never used  Substance and Sexual Activity   Alcohol use: No    Alcohol/week: 0.0 standard drinks of alcohol   Drug use: No   Sexual activity: Not Currently  Other Topics Concern   Not on file  Social History Narrative   Independent at baseline. Lives at home with her brother; in [place]. Quit smoking 20 years; no alcohol.    Social Determinants of Health   Financial Resource Strain: Not on file  Food Insecurity: Not on file  Transportation Needs: Not on file  Physical Activity: Not on file  Stress: Not on file  Social Connections: Not on file  Intimate Partner Violence: Not on file   Family History  Problem Relation Age of Onset   Prostate cancer Father    Other Sister        mouth cancer   Hypertension Mother    ROS: All systems reviewed and negative except as per HPI.   Current Outpatient Medications  Medication Sig Dispense Refill   ACCU-CHEK AVIVA PLUS test strip 1 each 2 (two) times daily.     Accu-Chek Softclix Lancets lancets USE 1 TO CHECK GLUCOSE TWICE DAILY     acetaminophen (TYLENOL) 325 MG tablet Take 2 tablets (650 mg total) by mouth every 6 (six) hours as needed for mild pain (or Fever >/= 101).     aspirin 81 MG chewable tablet Chew 1 tablet (81 mg total) by mouth daily. 30 tablet 0   FARXIGA 10 MG TABS tablet Take 1 tablet by mouth once daily 90 tablet 3   ferrous gluconate (FERGON) 325 MG tablet Take 325 mg by mouth in the morning and at bedtime.      furosemide (LASIX) 40 MG tablet Take 40 mg by mouth daily.     imatinib (GLEEVEC) 100 MG tablet TAKE 4 TABLETS (400 MG TOTAL) BY MOUTH DAILY. TAKE WITH MEALS AND LARGE GLASS OF WATER 120 tablet 1   insulin glargine (LANTUS) 100 UNIT/ML injection Inject 32 Units into the skin in the morning.      latanoprost (XALATAN) 0.005 % ophthalmic solution Place 1 drop into both eyes at bedtime.      lovastatin (MEVACOR) 20 MG tablet Take 20 mg by mouth every evening.      metFORMIN (GLUCOPHAGE) 1000 MG tablet Take 1,000 mg by mouth daily.      Multiple Vitamin (MULTIVITAMIN) tablet Take 1 tablet by mouth daily.     omeprazole (PRILOSEC) 40 MG capsule Take 40 mg by mouth  daily.     OPSUMIT 10 MG tablet Take 1 tablet daily. 30 tablet 11   potassium chloride SA (KLOR-CON) 20 MEQ tablet Take 1 tablet (20 mEq total) by mouth daily. 90 tablet 3   tadalafil, PAH, (ADCIRCA) 20 MG tablet TAKE 2 TABLETS DAILY. GENERIC FOR ADCIRCA 60 tablet 11   timolol (TIMOPTIC) 0.5 % ophthalmic solution Place 1 drop into both eyes 2 (two) times daily.      Tiotropium Bromide-Olodaterol (STIOLTO RESPIMAT) 2.5-2.5 MCG/ACT AERS INHALE 2 PUFFS BY MOUTH ONCE DAILY 4 g 3   No current facility-administered medications for this encounter.   BP (!) 110/50   Pulse 60   Wt 90.6 kg (199 lb 12.8 oz)   SpO2 97% Comment: 4 l n/c  BMI 35.96 kg/m  General: NAD Neck: No JVD, no thyromegaly or thyroid nodule.  Lungs: Clear to auscultation bilaterally with normal respiratory effort. CV: Nondisplaced PMI.  Heart regular S1/S2, no S3/S4, no murmur.  No peripheral edema.  No carotid bruit.  Normal pedal pulses.  Abdomen: Soft, nontender, no hepatosplenomegaly, no distention.  Skin: Intact without lesions or rashes.  Neurologic: Alert and oriented x 3.  Psych: Normal affect. Extremities: No clubbing or cyanosis.  HEENT: Normal.   Assessment/Plan: 1. RV failure: Echo in 1/21 showed significant RV dysfunction with severe pulmonary hypertension and D-shaped interventricular septum.  Based on RHC, it looks like RV failure is due to pulmonary arterial hypertension rather than LV failure (normal PCWP).  Repeated echo in 5/22 showed EF 60-65%, low normal RV function, mild RV enlargement, PASP 44 mmHg. Echo done today and reviewed showed EF  55-60%, RV mildly enlarged with normal systolic function, moderate LAE, normal IVC, PASP 35 mmHg. She is not significantly volume overloaded on exam today.   - Continue current Lasix.  Check BMET/BNP.   - Continue Farxiga.  - Repeat echo at followup in 3 months.  2. Pulmonary HTN: Echo in 1/21 with RV failure and estimate PA systolic pressure 80 mmHg.  RHC in 3/21 showed pulmonary arterial hypertension.  Serologic workup was negative.  V/Q scan showed no chronic PE.  HRCT showed no ILD, mild-moderate emphysema.  PFTs suggestive of moderate COPD. Pulmonary hypertension seems out of proportion to parenchymal disease, suspect significant group 1 component to her [place] in addition to group 3 (COPD).  She has been symptomatically improved with selective pulmonary vasodilators.  She was unable to tolerate Tyvaso.  - Continue to use Bipap at night, oxygen during the day.  - Continue Opsumit 10 mg daily.  - Continue tadalafil 40 mg daily.  - Check BNP today.   - 6 minute walk next appt.  3. H/o DVT: 2018.  She has IVC filter and is not anticoagulated due to h/o anemia.  4. Type 2 diabetes: She is now on Farxiga.  5. Chronic hypoxemic respiratory failure: Home oxygen.  Moderate COPD on PFTs, mild-moderate emphysema on HRCT.  Suspect pulmonary arterial hypertension also plays a role.  - Continue home oxygen.  Followup in 4 months.   Loralie Champagne 11/19/2021

## 2021-11-22 ENCOUNTER — Inpatient Hospital Stay: Payer: Medicare Other | Attending: Internal Medicine

## 2021-11-22 ENCOUNTER — Other Ambulatory Visit: Payer: Self-pay

## 2021-11-22 DIAGNOSIS — R5383 Other fatigue: Secondary | ICD-10-CM | POA: Insufficient documentation

## 2021-11-22 DIAGNOSIS — C49A3 Gastrointestinal stromal tumor of small intestine: Secondary | ICD-10-CM | POA: Diagnosis present

## 2021-11-22 DIAGNOSIS — I110 Hypertensive heart disease with heart failure: Secondary | ICD-10-CM | POA: Diagnosis not present

## 2021-11-22 DIAGNOSIS — E611 Iron deficiency: Secondary | ICD-10-CM

## 2021-11-22 DIAGNOSIS — K219 Gastro-esophageal reflux disease without esophagitis: Secondary | ICD-10-CM | POA: Diagnosis not present

## 2021-11-22 DIAGNOSIS — Z8042 Family history of malignant neoplasm of prostate: Secondary | ICD-10-CM | POA: Insufficient documentation

## 2021-11-22 DIAGNOSIS — Z9981 Dependence on supplemental oxygen: Secondary | ICD-10-CM | POA: Diagnosis not present

## 2021-11-22 DIAGNOSIS — D509 Iron deficiency anemia, unspecified: Secondary | ICD-10-CM | POA: Insufficient documentation

## 2021-11-22 DIAGNOSIS — I272 Pulmonary hypertension, unspecified: Secondary | ICD-10-CM | POA: Diagnosis not present

## 2021-11-22 DIAGNOSIS — Z79899 Other long term (current) drug therapy: Secondary | ICD-10-CM | POA: Insufficient documentation

## 2021-11-22 DIAGNOSIS — G4733 Obstructive sleep apnea (adult) (pediatric): Secondary | ICD-10-CM | POA: Insufficient documentation

## 2021-11-22 DIAGNOSIS — E119 Type 2 diabetes mellitus without complications: Secondary | ICD-10-CM | POA: Diagnosis not present

## 2021-11-22 DIAGNOSIS — Z794 Long term (current) use of insulin: Secondary | ICD-10-CM | POA: Insufficient documentation

## 2021-11-22 DIAGNOSIS — I509 Heart failure, unspecified: Secondary | ICD-10-CM | POA: Insufficient documentation

## 2021-11-22 DIAGNOSIS — Z86718 Personal history of other venous thrombosis and embolism: Secondary | ICD-10-CM | POA: Diagnosis not present

## 2021-11-22 DIAGNOSIS — E785 Hyperlipidemia, unspecified: Secondary | ICD-10-CM | POA: Insufficient documentation

## 2021-11-22 DIAGNOSIS — Z7984 Long term (current) use of oral hypoglycemic drugs: Secondary | ICD-10-CM | POA: Diagnosis not present

## 2021-11-22 DIAGNOSIS — Z7982 Long term (current) use of aspirin: Secondary | ICD-10-CM | POA: Diagnosis not present

## 2021-11-22 DIAGNOSIS — M797 Fibromyalgia: Secondary | ICD-10-CM | POA: Diagnosis not present

## 2021-11-22 DIAGNOSIS — R531 Weakness: Secondary | ICD-10-CM | POA: Diagnosis not present

## 2021-11-22 DIAGNOSIS — Z87891 Personal history of nicotine dependence: Secondary | ICD-10-CM | POA: Insufficient documentation

## 2021-11-22 DIAGNOSIS — J449 Chronic obstructive pulmonary disease, unspecified: Secondary | ICD-10-CM | POA: Insufficient documentation

## 2021-11-22 DIAGNOSIS — M7989 Other specified soft tissue disorders: Secondary | ICD-10-CM | POA: Diagnosis not present

## 2021-11-22 LAB — BASIC METABOLIC PANEL
Anion gap: 11 (ref 5–15)
BUN: 22 mg/dL (ref 8–23)
CO2: 24 mmol/L (ref 22–32)
Calcium: 8.3 mg/dL — ABNORMAL LOW (ref 8.9–10.3)
Chloride: 102 mmol/L (ref 98–111)
Creatinine, Ser: 1.16 mg/dL — ABNORMAL HIGH (ref 0.44–1.00)
GFR, Estimated: 49 mL/min — ABNORMAL LOW (ref >60)
Glucose, Bld: 173 mg/dL — ABNORMAL HIGH (ref 70–99)
Potassium: 3.8 mmol/L (ref 3.5–5.1)
Sodium: 137 mmol/L (ref 135–145)

## 2021-11-22 LAB — CBC WITH DIFFERENTIAL/PLATELET
Abs Immature Granulocytes: 0.01 10*3/uL (ref 0.00–0.07)
Basophils Absolute: 0 10*3/uL (ref 0.0–0.1)
Basophils Relative: 1 %
Eosinophils Absolute: 0 10*3/uL (ref 0.0–0.5)
Eosinophils Relative: 0 %
HCT: 35.9 % — ABNORMAL LOW (ref 36.0–46.0)
Hemoglobin: 11.8 g/dL — ABNORMAL LOW (ref 12.0–15.0)
Immature Granulocytes: 0 %
Lymphocytes Relative: 17 %
Lymphs Abs: 0.8 10*3/uL (ref 0.7–4.0)
MCH: 31.6 pg (ref 26.0–34.0)
MCHC: 32.9 g/dL (ref 30.0–36.0)
MCV: 96 fL (ref 80.0–100.0)
Monocytes Absolute: 0.4 10*3/uL (ref 0.1–1.0)
Monocytes Relative: 7 %
Neutro Abs: 3.8 10*3/uL (ref 1.7–7.7)
Neutrophils Relative %: 75 %
Platelets: 195 10*3/uL (ref 150–400)
RBC: 3.74 MIL/uL — ABNORMAL LOW (ref 3.87–5.11)
RDW: 13.2 % (ref 11.5–15.5)
WBC: 5 10*3/uL (ref 4.0–10.5)
nRBC: 0 % (ref 0.0–0.2)

## 2021-11-22 LAB — FERRITIN: Ferritin: 50 ng/mL (ref 11–307)

## 2021-11-22 LAB — IRON AND TIBC
Iron: 72 ug/dL (ref 28–170)
Saturation Ratios: 24 % (ref 10.4–31.8)
TIBC: 297 ug/dL (ref 250–450)
UIBC: 225 ug/dL

## 2021-11-25 ENCOUNTER — Encounter: Payer: Self-pay | Admitting: Internal Medicine

## 2021-11-25 ENCOUNTER — Inpatient Hospital Stay: Payer: Medicare Other | Admitting: Internal Medicine

## 2021-11-25 ENCOUNTER — Other Ambulatory Visit: Payer: Self-pay | Admitting: Internal Medicine

## 2021-11-25 DIAGNOSIS — C49A3 Gastrointestinal stromal tumor of small intestine: Secondary | ICD-10-CM | POA: Diagnosis not present

## 2021-11-25 NOTE — Assessment & Plan Note (Addendum)
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  Currently on adjuvant Gleevec [3-5 years].   # Tolerating Gleevec well except for mild side effects [diarrhea]; swelling in the feet [see below]  # Swelling in the feet-grade 1 Continue compression stockings/diuretics as needed.  #CHF [Dr.McClean, cards-[place]]/COPD home O2 4-6Lits;-borderline respiratory status-STABLE.   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  Today hemoglobin is   11.8  HOLD Venofer today-STABLE.  # Myalgias/arthralgias question Gleevec versus others- recommend ca+vit D; tylenol as needed.  STABLE.   # DISPOSITION:.   # HOLD venofer today # Follow up in 3  month-  MD; labs-cbc/cmp;iron studies/ferritin; possible venofer;Dr.B

## 2021-11-25 NOTE — Progress Notes (Signed)
[facility] NOTE  Patient Care Team: Albina Billet, MD as PCP - General (Unknown Physician Specialty) Wellington Hampshire, MD as PCP - Cardiology (Cardiology) Christene Lye, MD (General Surgery) Albina Billet, MD (Internal Medicine) Delana Meyer, Dolores Lory, MD (Vascular Surgery) Wilford Corner, MD as Consulting Physician (Gastroenterology) Cammie Sickle, MD as Consulting Physician (Hematology and Oncology)  CHIEF COMPLAINTS/PURPOSE OF CONSULTATION: GIST   HEMATOLOGY HISTORY  Oncology History Overview Note  NOV 2021- CT Bx- TUMOR  Tumor Site: Peritoneum, jejunal  Histologic Type: Gastrointestinal stromal tumor, spindle cell type  Mitotic Rate: 1 per 5 mm squared  Histologic Grade: G1, low grade (mitotic rate less than or equal to 5  per 5 mm squared)- STAGE T2N1 [possible mesneteric LN]; CT A/P-9.8 x9.8 cm  # NOV 29th 2021- GLEEVEC 400 mg/day neoadjuvant therapy 6 months.   SURGICAL PATHOLOGY  CASE: [id number]  PATIENT: Susan Barajas  Surgical Pathology Report      Specimen Submitted:  A. Lymph node, mesenteric  B. Lymph node, Proximal mesenteric  C. Small bowel with GIST   Clinical History: GIST small bowel       DIAGNOSIS:  A. LYMPH NODE, MESENTERIC; EXCISIONAL BIOPSY:  - REACTIVE LYMPHOID FOLLICULAR HYPERPLASIA WITH PROGRESSIVE  TRANSFORMATION OF GERMINAL CENTERS.  - NEGATIVE FOR MALIGNANCY, ONE LYMPH NODE (0/1).   B. LYMPH NODE, PROXIMAL MESENTERIC; EXCISIONAL BIOPSY:  - REACTIVE LYMPHOID FOLLICULAR HYPERPLASIA.  - NEGATIVE FOR MALIGNANCY, TWO LYMPH NODES (0/2).   C. SMALL BOWEL WITH GIST; RESECTION:  - GASTROINTESTINAL STROMAL TUMOR.  - SEE CANCER SUMMARY BELOW.   CASE SUMMARY: (GASTROINTESTINAL STROMAL TUMOR (GIST): Resection)  Standard(s): AJCC-UICC 8   CLINICAL  Pre-resection Treatment: Previous biopsy, systemic therapy performed  (Gleevec)   SPECIMEN  Procedure: Resection (partial small bowel)    TUMOR  Tumor Focality:  Unifocal  Tumor Site: Jejunum  Tumor Size: 9.7 x 9.2 x 6.3 cm  Histologic Type: Gastrointestinal stromal tumor, spindle cell type  Mitotic Rate:  Specify mitotic rate per 5 mm squared: Less than 5 per 5  mm squared  Histologic Grade: G1, low grade  Necrosis: Not identified  Treatment Effect: Present, approximately 30% viable tumor remains  Risk Assessment: Moderate (24%) risk of progressive disease   MARGINS:  Margin status:  Margin(s) involved by GIST: Deep, see comment.   Comment:  Tumor is present on ink on the periphery of the tumor nodule.  The  nature of the specimen precludes accurate assessment of where the tumor  may have been attached in situ (apart from the small bowel attachment).  Proximal and distal bowel margins cannot be reliably assessed, given  that they were received detached from the tumor specimen.   REGIONAL LYMPH NODES  Regional lymph node status:  Regional lymph nodes present, all regional lymph nodes negative for  tumor.  Number of lymph nodes examined: Three    # CHRONIC ANEMIA EGD/capsule-WNL-2018 [Dr.vanga]-UGIB ; colonoscopy-2018 [[facility]] ; capsule-not done; hemoglobin around 8.8; iron saturation 5% [Dr.Tate].  Poor tolerance of p.o. 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   GIST (gastrointestinal stromal tumor) of small bowel, malignant ([place])  03/12/2020 Initial Diagnosis   GIST (gastrointestinal stromal tumor) of small bowel, malignant (H[facility])     HISTORY OF PRESENTING ILLNESS: Alone; ambulating independently. 6 L of oxygen nasal cannula.    Susan Barajas 76 y.o.  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  Patient denies any worsening swelling of the legs.  She continues to wear compression stockings.  Intermittently on diuretics. Denies any nausea vomiting from the [place].  Review of Systems  Constitutional:  Positive for malaise/fatigue. Negative for chills, diaphoresis, fever and weight loss.  HENT:  Negative for nosebleeds and sore throat.   Eyes:  Negative for double vision.  Respiratory:  Positive for shortness of breath. Negative for cough, hemoptysis, sputum production and wheezing.   Cardiovascular:  Negative for chest pain, palpitations, orthopnea and leg swelling.  Gastrointestinal:  Negative for abdominal pain, blood in stool, constipation, diarrhea, heartburn, melena, nausea and vomiting.  Genitourinary:  Negative for dysuria, frequency and urgency.  Musculoskeletal:  Negative for back pain and joint pain.  Skin: Negative.  Negative for itching and rash.  Neurological:  Negative for dizziness, tingling, focal weakness, weakness and headaches.  Endo/Heme/Allergies:  Does not bruise/bleed easily.  Psychiatric/Behavioral:  Negative for depression. The patient is not nervous/anxious and does not have insomnia.     MEDICAL HISTORY:  Past Medical History:  Diagnosis Date   Anemia    Asthma 1950   CHF (congestive heart failure) (HCC)    COPD (chronic obstructive pulmonary disease) (HCC)    Cor pulmonale (HCC)    DVT (deep venous thrombosis) ([place]) 2018   GERD (gastroesophageal reflux disease)    GIST (gastrointestinal stromal tumor) of small bowel, malignant (HCC)    Glaucoma 1949   Hyperlipidemia    Hypertension    Obesity, unspecified    OSA treated with BiPAP    Personal history of tobacco use, presenting hazards to health    Pneumonia    Presence of IVC filter    Pulmonary HTN (HCC)    Supplemental oxygen dependent    T2DM (type 2  diabetes mellitus) ([place])    Varicose veins of lower extremities with other complications     SURGICAL HISTORY: Past Surgical History:  Procedure Laterality Date   BREAST BIOPSY Right 1991   BREAST BIOPSY Left 2013   COLONOSCOPY  2010   Dr. Vira Agar   COLONOSCOPY  Jan 2016   Dr Vira Agar   ESOPHAGOGASTRODUODENOSCOPY N/A 11/26/2016   Procedure: ESOPHAGOGASTRODUODENOSCOPY (EGD);  Surgeon: Lin Landsman, MD;  Location: [facility] ENDOSCOPY;  Service: Gastroenterology;  Laterality: N/A;   GIVENS CAPSULE STUDY N/A 11/26/2016   Procedure: GIVENS CAPSULE STUDY, if EGD negative, plan to drop capsule with scope if EGD negative;  Surgeon: Lin Landsman, MD;  Location: [facility] ENDOSCOPY;  Service: Gastroenterology;  Laterality: N/A;   IVC FILTER INSERTION N/A 11/14/2016   Procedure: IVC Filter Insertion;  Surgeon: Katha Cabal, MD;  Location: [facility] LAB;  Service: Cardiovascular;  Laterality: N/A;   PARTIAL COLECTOMY N/A 11/05/2020   Procedure: SMALL BOWEL RESECTION, REMOVAL OF GIST TUMOR AND LYMPH NODES, UMBILICAL HERNIA REPAIR;  Surgeon: Robert Bellow, MD;  Location: [facility] ORS;  Service: General;  Laterality: N/A;  small bowel resection, RNFA to assist; TAPP block per anesthesia   RIGHT/LEFT HEART CATH AND CORONARY ANGIOGRAPHY N/A 06/30/2019   Procedure: RIGHT/LEFT HEART  CATH AND CORONARY ANGIOGRAPHY;  Surgeon: Larey Dresser, MD;  Location: [facility];  Service: Cardiovascular;  Laterality: N/A;   VEIN SURGERY Right 2006   Vein Closure Procedure; RF ablation of right GSV    SOCIAL HISTORY: Social History   Socioeconomic History   Marital status: Single    Spouse name: Not on file   Number of children: 2   Years of education: Not on file   Highest education level: Not on file  Occupational History   Occupation: CELL    Employer: RETIRED  Tobacco Use   Smoking status: Former    Packs/day: 1.50    Years: 20.00    Total pack years: 30.00    Types: Cigarettes     Quit date: 1999    Years since quitting: 24.6   Smokeless tobacco: Never  Vaping Use   Vaping Use: Never used  Substance and Sexual Activity   Alcohol use: No    Alcohol/week: 0.0 standard drinks of alcohol   Drug use: No   Sexual activity: Not Currently  Other Topics Concern   Not on file  Social History Narrative   Independent at baseline. Lives at home with her brother; in [place]. Quit smoking 20 years; no alcohol.    Social Determinants of Health   Financial Resource Strain: Not on file  Food Insecurity: Not on file  Transportation Needs: Not on file  Physical Activity: Not on file  Stress: Not on file  Social Connections: Not on file  Intimate Partner Violence: Not on file    FAMILY HISTORY: Family History  Problem Relation Age of Onset   Prostate cancer Father    Other Sister        mouth cancer   Hypertension Mother     ALLERGIES:  has No Known Allergies.  MEDICATIONS:  Current Outpatient Medications  Medication Sig Dispense Refill   ACCU-CHEK AVIVA PLUS test strip 1 each 2 (two) times daily.     Accu-Chek Softclix Lancets lancets USE 1 TO CHECK GLUCOSE TWICE DAILY     acetaminophen (TYLENOL) 325 MG tablet Take 2 tablets (650 mg total) by mouth every 6 (six) hours as needed for mild pain (or Fever >/= 101).     aspirin 81 MG chewable tablet Chew 1 tablet (81 mg total) by mouth daily. 30 tablet 0   FARXIGA 10 MG TABS tablet Take 1 tablet by mouth once daily 90 tablet 3   ferrous gluconate (FERGON) 325 MG tablet Take 325 mg by mouth in the morning and at bedtime.      furosemide (LASIX) 40 MG tablet Take 40 mg by mouth daily.     imatinib (GLEEVEC) 100 MG tablet TAKE 4 TABLETS (400 MG TOTAL) BY MOUTH DAILY. TAKE WITH MEALS AND LARGE GLASS OF WATER 120 tablet 1   insulin glargine (LANTUS) 100 UNIT/ML injection Inject 32 Units into the skin in the morning.     latanoprost (XALATAN) 0.005 % ophthalmic solution Place 1 drop into both eyes at bedtime.       lovastatin (MEVACOR) 20 MG tablet Take 20 mg by mouth every evening.      metFORMIN (GLUCOPHAGE) 1000 MG tablet Take 1,000 mg by mouth daily.      Multiple Vitamin (MULTIVITAMIN) tablet Take 1 tablet by mouth daily.     omeprazole (PRILOSEC) 40 MG capsule Take 40 mg by mouth daily.     OPSUMIT 10 MG tablet Take 1 tablet daily. 30 tablet 11  potassium chloride SA (KLOR-CON) 20 MEQ tablet Take 1 tablet (20 mEq total) by mouth daily. 90 tablet 3   tadalafil, PAH, (ADCIRCA) 20 MG tablet TAKE 2 TABLETS DAILY. GENERIC FOR ADCIRCA 60 tablet 11   timolol (TIMOPTIC) 0.5 % ophthalmic solution Place 1 drop into both eyes 2 (two) times daily.      Tiotropium Bromide-Olodaterol (STIOLTO RESPIMAT) 2.5-2.5 MCG/ACT AERS INHALE 2 PUFFS BY MOUTH ONCE DAILY 4 g 3   No current facility-administered medications for this visit.      PHYSICAL EXAMINATION:   Vitals:   11/25/21 1309  BP: (!) 125/45  Pulse: 61  Temp: 98 F (36.7 C)  SpO2: (!) 88%   Filed Weights   11/25/21 1309  Weight: 201 lb 9.6 oz (91.4 kg)    Physical Exam HENT:     Head: Normocephalic and atraumatic.     Mouth/Throat:     Pharynx: No oropharyngeal exudate.  Eyes:     Pupils: Pupils are equal, round, and reactive to light.  Cardiovascular:     Rate and Rhythm: Normal rate and regular rhythm.  Pulmonary:     Effort: No respiratory distress.     Breath sounds: No wheezing.     Comments: Decreased breath sounds. Abdominal:     General: Bowel sounds are normal. There is no distension.     Palpations: Abdomen is soft. There is no mass.     Tenderness: There is no abdominal tenderness. There is no guarding or rebound.  Musculoskeletal:        General: No tenderness. Normal range of motion.     Cervical back: Normal range of motion and neck supple.  Skin:    General: Skin is warm.  Neurological:     Mental Status: She is alert and oriented to person, place, and time.  Psychiatric:        Mood and Affect: Affect normal.      LABORATORY DATA:  I have reviewed the data as listed Lab Results  Component Value Date   WBC 5.0 11/22/2021   HGB 11.8 (L) 11/22/2021   HCT 35.9 (L) 11/22/2021   MCV 96.0 11/22/2021   PLT 195 11/22/2021   Recent Labs    01/25/21 1028 03/12/21 1014 04/26/21 1025 07/10/21 1127 07/26/21 1242 11/19/21 1017 11/22/21 1134  NA 133*   < > 131*   < > 137 140 137  K 3.8   < > 4.2   < > 4.2 3.7 3.8  CL 103   < > 101   < > 103 109 102  CO2 25   < > 25   < > _0 GLUCOSE 144*   < > 156*   < > 112* 136* 173*  BUN 19   < > 22   < > 24* 25* 22  CREATININE 0.77   < > 0.86   < > 0.93 1.11* 1.16*  CALCIUM 8.3*   < > 8.4*   < > 9.1 8.9 8.3*  GFRNONAA >60   < > >60   < > >60 52* 49*  PROT 6.4*  --  7.3  --  7.2  --   --   ALBUMIN 2.9*  --  3.8  --  3.9  --   --   AST 18  --  17  --  19  --   --   ALT 15  --  13  --  15  --   --  ALKPHOS 61  --  57  --  53  --   --   BILITOT 0.2*  --  0.3  --  0.4  --   --    < > = values in this interval not displayed.     ECHOCARDIOGRAM COMPLETE  Result Date: 11/19/2021    ECHOCARDIOGRAM REPORT   Patient Name:   Susan Barajas Date of Exam: 11/19/2021 Medical Rec #:  [id number]            Height:       62.5 in Accession #:    [id number]           Weight:       192.6 lb Date of Birth:  1945/04/24             BSA:          1.893 m Patient Age:    43 years             BP:           113/60 mmHg Patient Gender: F                    HR:           59 bpm. Exam Location:  Outpatient Procedure: 2D Echo, Color Doppler and Cardiac Doppler Indications:     Pulmonary hypertension  History:         Patient has prior history of Echocardiogram examinations.  Sonographer:     NA Referring Phys:  Ferron: Kirk Ruths Barajas IMPRESSIONS  1. Left ventricular ejection fraction, by estimation, is 55 to 60%. The left ventricle has normal function. The left ventricle has no regional wall motion abnormalities. Left ventricular diastolic parameters  were normal.  2. Right ventricular systolic function is normal. The right ventricular size is mildly enlarged. There is normal pulmonary artery systolic pressure. The estimated right ventricular systolic pressure is 25.0 mmHg.  3. Left atrial size was moderately dilated.  4. Right atrial size was mildly dilated.  5. The mitral valve is normal in structure. Mild mitral valve regurgitation. No evidence of mitral stenosis.  6. The aortic valve is tricuspid. Aortic valve regurgitation is not visualized. No aortic stenosis is present.  7. Aortic dilatation noted. There is mild dilatation of the ascending aorta, measuring 39 mm.  8. The inferior vena cava is normal in size with greater than 50% respiratory variability, suggesting right atrial pressure of 3 mmHg. FINDINGS  Left Ventricle: Left ventricular ejection fraction, by estimation, is 55 to 60%. The left ventricle has normal function. The left ventricle has no regional wall motion abnormalities. The left ventricular internal cavity size was normal in size. There is  no left ventricular hypertrophy. Left ventricular diastolic parameters were normal. Right Ventricle: The right ventricular size is mildly enlarged. No increase in right ventricular wall thickness. Right ventricular systolic function is normal. There is normal pulmonary artery systolic pressure. The tricuspid regurgitant velocity is 2.84  m/s, and with an assumed right atrial pressure of 3 mmHg, the estimated right ventricular systolic pressure is 53.9 mmHg. Left Atrium: Left atrial size was moderately dilated. Right Atrium: Right atrial size was mildly dilated. Pericardium: There is no evidence of pericardial effusion. Mitral Valve: The mitral valve is normal in structure. Mild mitral valve regurgitation. No evidence of mitral valve stenosis. Tricuspid Valve: The tricuspid valve is normal in structure. Tricuspid valve regurgitation is trivial. Aortic Valve: The aortic valve is  tricuspid. Aortic valve  regurgitation is not visualized. No aortic stenosis is present. Pulmonic Valve: The pulmonic valve was normal in structure. Pulmonic valve regurgitation is not visualized. Aorta: The aortic root is normal in size and structure and aortic dilatation noted. There is mild dilatation of the ascending aorta, measuring 39 mm. Venous: The inferior vena cava is normal in size with greater than 50% respiratory variability, suggesting right atrial pressure of 3 mmHg. IAS/Shunts: No atrial level shunt detected by color flow Doppler.  LEFT VENTRICLE PLAX 2D LVIDd:         5.70 cm   Diastology LVIDs:         3.10 cm   LV e' medial:    9.36 cm/s LV PW:         1.00 cm   LV E/e' medial:  10.9 LV IVS:        1.00 cm   LV e' lateral:   18.80 cm/s LVOT diam:     2.20 cm   LV E/e' lateral: 5.4 LV SV:         97 LV SV Index:   51 LVOT Area:     3.80 cm  RIGHT VENTRICLE RV Basal diam:  4.30 cm RV Mid diam:    3.10 cm RV S prime:     13.90 cm/s TAPSE (M-mode): 2.4 cm LEFT ATRIUM             Index        RIGHT ATRIUM           Index LA diam:        4.00 cm 2.11 cm/m   RA Area:     20.00 cm LA Vol (A2C):   89.8 ml 47.45 ml/m  RA Volume:   59.90 ml  31.65 ml/m LA Vol (A4C):   73.5 ml 38.84 ml/m LA Biplane Vol: 83.7 ml 44.23 ml/m  AORTIC VALVE LVOT Vmax:   107.50 cm/s LVOT Vmean:  68.100 cm/s LVOT VTI:    0.256 m  AORTA Ao Root diam: 3.50 cm Ao Asc diam:  3.90 cm MITRAL VALVE                TRICUSPID VALVE MV Area (PHT): 4.15 cm     TR Peak grad:   32.3 mmHg MV Decel Time: 183 msec     TR Vmax:        284.00 cm/s MV E velocity: 102.00 cm/s MV A velocity: 78.60 cm/s   SHUNTS MV E/A ratio:  1.30         Systemic VTI:  0.26 m                             Systemic Diam: 2.20 cm Dalton Barajas Electronically signed by Franki Monte Signature Date/Time: 11/19/2021/10:07:00 AM    Final (Updated)      GIST (gastrointestinal stromal tumor) of small bowel, malignant ([place]) # GIST of small bowel-low grade STAGE II [T3N0]-s/p neoadjuvant  therapy with Gleevec x6 months [November-end of June 2022]-s/p resection ~30% viable tumor noted post resection specimen.  Currently on adjuvant Gleevec [3-5 years].   # Tolerating Gleevec well except for mild side effects [diarrhea]; swelling in the feet [see below]  # Swelling in the feet-grade 1 Continue compression stockings/diuretics as needed.  #CHF [Dr.McClean, cards-[place]]/COPD home O2 4-6Lits;-borderline respiratory status-STABLE.   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  Today hemoglobin is  11.8  HOLD Venofer today-STABLE.  # Myalgias/arthralgias question Gleevec versus others- recommend ca+vit D; tylenol as needed.  STABLE.   # DISPOSITION:.   # HOLD venofer today # Follow up in 3  month-  MD; labs-cbc/cmp;iron studies/ferritin; possible venofer;Dr.B     All questions were answered. The patient knows to call the clinic with any problems, questions or concerns.    Cammie Sickle, MD 11/25/2021 2:07 PM

## 2021-12-03 ENCOUNTER — Other Ambulatory Visit (HOSPITAL_COMMUNITY): Payer: Self-pay

## 2021-12-03 ENCOUNTER — Other Ambulatory Visit: Payer: Self-pay | Admitting: Internal Medicine

## 2021-12-03 DIAGNOSIS — C49A3 Gastrointestinal stromal tumor of small intestine: Secondary | ICD-10-CM

## 2021-12-03 MED ORDER — IMATINIB MESYLATE 100 MG PO TABS
ORAL_TABLET | ORAL | 1 refills | Status: DC
  Filled 2021-12-03: qty 120, 30d supply, fill #0
  Filled 2021-12-30: qty 120, 30d supply, fill #1

## 2021-12-05 ENCOUNTER — Other Ambulatory Visit (HOSPITAL_COMMUNITY): Payer: Self-pay

## 2021-12-27 ENCOUNTER — Other Ambulatory Visit (HOSPITAL_COMMUNITY): Payer: Self-pay

## 2021-12-30 ENCOUNTER — Other Ambulatory Visit (HOSPITAL_COMMUNITY): Payer: Self-pay

## 2021-12-31 ENCOUNTER — Other Ambulatory Visit (HOSPITAL_COMMUNITY): Payer: Self-pay

## 2022-01-21 ENCOUNTER — Other Ambulatory Visit: Payer: Self-pay | Admitting: Internal Medicine

## 2022-01-21 ENCOUNTER — Other Ambulatory Visit (HOSPITAL_COMMUNITY): Payer: Self-pay

## 2022-01-21 DIAGNOSIS — C49A3 Gastrointestinal stromal tumor of small intestine: Secondary | ICD-10-CM

## 2022-01-21 MED ORDER — IMATINIB MESYLATE 100 MG PO TABS
ORAL_TABLET | ORAL | 1 refills | Status: DC
  Filled 2022-01-21: qty 120, 30d supply, fill #0
  Filled 2022-02-20: qty 120, 30d supply, fill #1

## 2022-01-21 NOTE — Telephone Encounter (Signed)
CBC with Differential/Platelet Order: [id number] Status: Final result    Visible to patient: No (inaccessible in MyChart)    Next appt: 01/29/2022 at 11:00 AM in Dermatology Sarina Ser, MD)    Dx: Iron deficiency    0 Result Notes           Component Ref Range & Units 2 mo ago (11/22/21) 5 mo ago (07/26/21) 9 mo ago (04/26/21) 12 mo ago (01/25/21) 1 yr ago (11/23/20) 1 yr ago (11/10/20) 1 yr ago (11/09/20)  WBC 4.0 - 10.5 K/uL 5.0  4.9  5.2  9.8  6.8  6.3  7.5   RBC 3.87 - 5.11 MIL/uL 3.74 Low   3.90  3.64 Low   3.83 Low   3.40 Low   2.93 Low   2.84 Low    Hemoglobin 12.0 - 15.0 g/dL 11.8 Low   12.0  11.2 Low   11.1 Low   10.0 Low   8.4 Low   8.6 Low    HCT 36.0 - 46.0 % 35.9 Low   36.4  34.3 Low   33.5 Low   31.6 Low   26.9 Low   26.9 Low    MCV 80.0 - 100.0 fL 96.0  93.3  94.2  87.5  92.9  91.8  94.7   MCH 26.0 - 34.0 pg 31.6  30.8  30.8  29.0  29.4  28.7  30.3   MCHC 30.0 - 36.0 g/dL 32.9  33.0  32.7  33.1  31.6  31.2  32.0   RDW 11.5 - 15.5 % 13.2  14.6  14.4  15.5  16.2 High   15.4  16.0 High    Platelets 150 - 400 K/uL 195  187  201  268  342  220  200   nRBC 0.0 - 0.2 % 0.0  0.0  0.0  0.0  0.0  0.0 CM  0.0 CM   Neutrophils Relative % % 75  76  79  85  83     Neutro Abs 1.7 - 7.7 K/uL 3.8  3.7  4.1  8.2 High   5.6     Lymphocytes Relative % _0 Lymphs Abs 0.7 - 4.0 K/uL 0.8  0.8  0.7  0.8  0.7     Monocytes Relative % _1 Monocytes Absolute 0.1 - 1.0 K/uL 0.4  0.3  0.4  0.6  0.5     Eosinophils Relative % 0  0  0  0  0     Eosinophils Absolute 0.0 - 0.5 K/uL 0.0  0.0  0.0  0.0  0.0     Basophils Relative % 1  1  0  0  0     Basophils Absolute 0.0 - 0.1 K/uL 0.0  0.0  0.0  0.0  0.0     Immature Granulocytes % 0  0  0  1  0     Abs Immature Granulocytes 0.00 - 0.07 K/uL 0.01  0.01 CM  0.01 CM  0.08 High  CM  0.01 CM     Comment: Performed at [facility], [address] [place], [place] [address]  Resulting Agency  [facility] CLIN LAB [place]  _2  LAB         Specimen Collected: 11/22/21  11:34 Last Resulted: 11/22/21 11:42      Lab Flowsheet    Order Details    View Encounter    Lab and Collection Details    Routing    Result History    View All Conversations on this Encounter      CM=Additional comments      Result Care Coordination   Patient Communication   Add Comments   Not seen Back to Top       Other Results from 11/22/2021  Ferritin Order: [id number] Status: Final result    Visible to patient: No (inaccessible in MyChart)    Next appt: 01/29/2022 at 11:00 AM in Dermatology Sarina Ser, MD)    Dx: Iron deficiency    0 Result Notes           Component Ref Range & Units 2 mo ago (11/22/21) 5 mo ago (07/26/21) 9 mo ago (04/26/21) 12 mo ago (01/25/21) 1 yr ago (06/05/20) 1 yr ago (02/10/20) 2 yr ago (08/15/19)  Ferritin 11 - 307 ng/mL 50  63 CM  78 CM  138 CM  17 CM  27 CM  10 Low  CM   Comment: Performed at [facility], [address] [place], [place] [address]  Resulting Agency  _0  [facility] CLIN LAB [facility] CLIN LAB         Specimen Collected: 11/22/21 11:34 Last Resulted: 11/22/21 16:05      Lab Flowsheet    Order Details    View Encounter    Lab and Collection Details    Routing    Result History    View All Conversations on this Encounter      CM=Additional comments      Result Care Coordination   Patient Communication   Add Comments   Not seen Back to Top          Contains abnormal data Basic metabolic panel Order: [id number] Status: Final result    Visible to patient: No (inaccessible in MyChart)    Next appt: 01/29/2022 at 11:00 AM in Dermatology Sarina Ser, MD)    Dx: Iron deficiency    0 Result Notes   1 HM Topic           Component Ref Range & Units 2 mo ago (11/22/21) 2 mo ago (11/19/21) 5 mo ago (07/26/21) 6 mo ago (07/10/21) 9 mo ago (04/26/21) 10  mo ago (03/12/21) 12 mo ago (01/25/21)  Sodium 135 - 145 mmol/L 137  140  137  138  131 Low   137  133 Low    Potassium 3.5 - 5.1 mmol/L 3.8  3.7  4.2  4.6  4.2  3.8  3.8   Chloride 98 - 111 mmol/L 102  109  103  107  101  107  103   CO2 22 - 32 mmol/L _1 Glucose, Bld 70 - 99 mg/dL 173 High   136 High  CM  112 High  CM  87 CM  156 High  CM  96 CM  144 High  CM   Comment: Glucose reference range applies only to samples taken after fasting for at least 8 hours.  BUN 8 - 23 mg/dL 22  25 High   24 High   _2 Creatinine, Ser 0.44 -  1.00 mg/dL 1.16 High   1.11 High   0.93  0.93  0.86  0.82  0.77   Calcium 8.9 - 10.3 mg/dL 8.3 Low   8.9  9.1  9.0  8.4 Low   8.6 Low   8.3 Low    GFR, Estimated >60 mL/min 49 Low   52 Low  CM  >60 CM  >60 CM  >60 CM  >60 CM  >60 CM   Comment: (NOTE)  Calculated using the CKD-EPI Creatinine Equation (2021)   Anion gap 5 - _0 CM  7 CM  7 CM  5 CM  6 CM  5 CM   Comment: Performed at [facility], [address] [place], [place] [address]  Resulting Agency  _1  [facility] CLIN LAB [facility] CLIN LAB         Specimen Collected: 11/22/21 11:34 Last Resulted: 11/22/21 11:51

## 2022-01-22 ENCOUNTER — Encounter: Payer: Self-pay | Admitting: Internal Medicine

## 2022-01-27 ENCOUNTER — Encounter: Payer: Self-pay | Admitting: Internal Medicine

## 2022-01-27 ENCOUNTER — Other Ambulatory Visit (HOSPITAL_COMMUNITY): Payer: Self-pay

## 2022-01-29 ENCOUNTER — Ambulatory Visit (INDEPENDENT_AMBULATORY_CARE_PROVIDER_SITE_OTHER): Payer: Medicare Other | Admitting: Dermatology

## 2022-01-29 DIAGNOSIS — R21 Rash and other nonspecific skin eruption: Secondary | ICD-10-CM | POA: Diagnosis not present

## 2022-01-29 DIAGNOSIS — L819 Disorder of pigmentation, unspecified: Secondary | ICD-10-CM | POA: Diagnosis not present

## 2022-01-29 MED ORDER — CLOBETASOL PROP EMOLLIENT BASE 0.05 % EX CREA: 1.0000 | TOPICAL_CREAM | Freq: Every day | CUTANEOUS | 1 refills | Status: DC

## 2022-01-29 NOTE — Patient Instructions (Addendum)
Topical steroids (such as triamcinolone, fluocinolone, fluocinonide, mometasone, clobetasol, halobetasol, betamethasone, hydrocortisone) can cause thinning and lightening of the skin if they are used for too long in the same area. Your physician has selected the right strength medicine for your problem and area affected on the body. Please use your medication only as directed by your physician to prevent side effects.    Due to recent changes in healthcare laws, you may see results of your pathology and/or laboratory studies on MyChart before the doctors have had a chance to review them. We understand that in some cases there may be results that are confusing or concerning to you. Please understand that not all results are received at the same time and often the doctors may need to interpret multiple results in order to provide you with the best plan of care or course of treatment. Therefore, we ask that you please give [name] 2 business days to thoroughly review all your results before contacting the office for clarification. Should we see a critical lab result, you will be contacted sooner.   If You Need Anything After Your Visit  If you have any questions or concerns for your doctor, please call our main line at [phone] and press option 4 to reach your doctor's medical assistant. If no one answers, please leave a voicemail as directed and we will return your call as soon as possible. Messages left after 4 pm will be answered the following business day.   You may also send [name] a message via [place]. We typically respond to MyChart messages within 1-2 business days.  For prescription refills, please ask your pharmacy to contact our office. Our fax number is [phone].  If you have an urgent issue when the clinic is closed that cannot wait until the next business day, you can page your doctor at the number below.    Please note that while we do our best to be available for urgent issues outside of  office hours, we are not available 24/7.   If you have an urgent issue and are unable to reach [name], you may choose to seek medical care at your doctor's office, retail clinic, urgent care center, or emergency room.  If you have a medical emergency, please immediately call 911 or go to the emergency department.  Pager Numbers  - Dr. Nehemiah Massed: [phone]  - Dr. Laurence Ferrari: [phone]  - Dr. Nicole Kindred: [phone]  In the event of inclement weather, please call our main line at [phone] for an update on the status of any delays or closures.  Dermatology Medication Tips: Please keep the boxes that topical medications come in in order to help keep track of the instructions about where and how to use these. Pharmacies typically print the medication instructions only on the boxes and not directly on the medication tubes.   If your medication is too expensive, please contact our office at [phone] option 4 or send [name] a message through [place].   We are unable to tell what your co-pay for medications will be in advance as this is different depending on your insurance coverage. However, we may be able to find a substitute medication at lower cost or fill out paperwork to get insurance to cover a needed medication.   If a prior authorization is required to get your medication covered by your insurance company, please allow [name] 1-2 business days to complete this process.  Drug prices often vary depending on where the prescription is filled and some pharmacies  may offer cheaper prices.  The website www.goodrx.com contains coupons for medications through different pharmacies. The prices here do not account for what the cost may be with help from insurance (it may be cheaper with your insurance), but the website can give you the price if you did not use any insurance.  - You can print the associated coupon and take it with your prescription to the pharmacy.  - You may also stop by our office during  regular business hours and pick up a GoodRx coupon card.  - If you need your prescription sent electronically to a different pharmacy, notify our office through [facility] or by phone at [phone] option 4.     Si Usted Necesita Algo Despus de Su Visita  Tambin puede enviarnos un mensaje a travs de Pharmacist, community. Por lo general respondemos a los mensajes de MyChart en el transcurso de 1 a 2 das hbiles.  Para renovar recetas, por favor pida a su farmacia que se ponga en contacto con nuestra oficina. Harland Dingwall de fax es [place] [phone].  Si tiene un asunto urgente cuando la clnica est cerrada y que no puede esperar hasta el siguiente da hbil, puede llamar/localizar a su doctor(a) al nmero que aparece a continuacin.   Por favor, tenga en cuenta que aunque hacemos todo lo posible para estar disponibles para asuntos urgentes fuera del horario de [place], no estamos disponibles las 24 horas del da, los 7 das de la [place].   Si tiene un problema urgente y no puede comunicarse con nosotros, puede optar por buscar atencin mdica  en el consultorio de su doctor(a), en una clnica privada, en un centro de atencin urgente o en una sala de emergencias.  Si tiene Engineering geologist, por favor llame inmediatamente al 911 o vaya a la sala de emergencias.  Nmeros de bper  - Dr. Nehemiah Massed: [phone]  - Dra. Moye: [phone]  - Dra. Nicole Kindred: [phone]  En caso de inclemencias del [place], por favor llame a Johnsie Kindred principal al [phone] para una actualizacin sobre el [place] de cualquier retraso o cierre.  Consejos para la medicacin en dermatologa: Por favor, guarde las cajas en las que vienen los medicamentos de uso tpico para ayudarle a seguir las instrucciones sobre dnde y cmo usarlos. Las farmacias generalmente imprimen las instrucciones del medicamento slo en las cajas y no directamente en los tubos del [place].   Si su medicamento es muy  caro, por favor, pngase en contacto con Zigmund Daniel llamando al [phone] y presione la opcin 4 o envenos un mensaje a travs de Pharmacist, community.   No podemos decirle cul ser su copago por los medicamentos por adelantado ya que esto es diferente dependiendo de la cobertura de su seguro. Sin embargo, es posible que podamos encontrar un medicamento sustituto a Electrical engineer un formulario para que el seguro cubra el medicamento que se considera necesario.   Si se requiere una autorizacin previa para que su compaa de seguros [place] su medicamento, por favor permtanos de 1 a 2 das hbiles para completar este proceso.  Los precios de los medicamentos varan con frecuencia dependiendo del Environmental consultant de dnde se surte la receta y alguna farmacias pueden ofrecer precios ms baratos.  El sitio web www.goodrx.com tiene cupones para medicamentos de Airline pilot. Los precios aqu no tienen en cuenta lo que podra costar con la ayuda del seguro (puede ser ms barato con su seguro), pero el sitio web puede darle el precio si no  utiliz ningn seguro.  - Puede imprimir el cupn correspondiente y llevarlo con su receta a la farmacia.  - Tambin puede pasar por nuestra oficina durante el horario de atencin regular y Charity fundraiser una tarjeta de cupones de GoodRx.  - Si necesita que su receta se enve electrnicamente a una farmacia diferente, informe a nuestra oficina a travs de MyChart de [place] o por telfono llamando al [phone] y presione la opcin 4.

## 2022-01-29 NOTE — Progress Notes (Signed)
   New Patient Visit  Subjective  Susan Barajas is a 76 y.o. female who presents for the following: Other (Spot on post neck x ~1 year - gets irritated once in a while).  The following portions of the chart were reviewed this encounter and updated as appropriate:   Tobacco  Allergies  Meds  Problems  Med Hx  Surg Hx  Fam Hx     Review of Systems:  No other skin or systemic complaints except as noted in HPI or Assessment and Plan.  Objective  Well appearing patient in no apparent distress; mood and affect are within normal limits.  A focused examination was performed including neck. Relevant physical exam findings are noted in the Assessment and Plan.  Neck - Posterior 3.0 x 1.5 cm dyspigmented plaque of post neck. Ears are clear today.      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  Start Clobetasol cream qd up to 5 days per week - avoid face, groin, underarms  Clobetasol Prop Emollient Base (CLOBETASOL PROPIONATE E) 0.05 % emollient cream - Neck - Posterior Apply 1 Application topically daily. Up to 5 days per week - avoid face, groin,underarms   Return in about 6 weeks (around 03/12/2022) for Follow up.  I, Ashok Cordia, CMA, am acting as scribe for Sarina Ser, MD . Documentation: I have reviewed the above documentation for accuracy and completeness, and I agree with the above.  Sarina Ser, MD

## 2022-02-08 ENCOUNTER — Encounter: Payer: Self-pay | Admitting: Dermatology

## 2022-02-18 ENCOUNTER — Other Ambulatory Visit (HOSPITAL_COMMUNITY): Payer: Self-pay

## 2022-02-20 ENCOUNTER — Other Ambulatory Visit (HOSPITAL_COMMUNITY): Payer: Self-pay

## 2022-02-24 ENCOUNTER — Inpatient Hospital Stay: Payer: Medicare Other

## 2022-02-24 ENCOUNTER — Inpatient Hospital Stay (HOSPITAL_BASED_OUTPATIENT_CLINIC_OR_DEPARTMENT_OTHER): Payer: Medicare Other | Admitting: Internal Medicine

## 2022-02-24 ENCOUNTER — Other Ambulatory Visit (HOSPITAL_COMMUNITY): Payer: Self-pay

## 2022-02-24 ENCOUNTER — Inpatient Hospital Stay: Payer: Medicare Other | Attending: Internal Medicine

## 2022-02-24 ENCOUNTER — Encounter: Payer: Self-pay | Admitting: Internal Medicine

## 2022-02-24 VITALS — BP 131/51 | HR 73 | Temp 98.6°F | Resp 17 | Wt 202.8 lb

## 2022-02-24 DIAGNOSIS — Z79899 Other long term (current) drug therapy: Secondary | ICD-10-CM | POA: Insufficient documentation

## 2022-02-24 DIAGNOSIS — J449 Chronic obstructive pulmonary disease, unspecified: Secondary | ICD-10-CM | POA: Insufficient documentation

## 2022-02-24 DIAGNOSIS — G4733 Obstructive sleep apnea (adult) (pediatric): Secondary | ICD-10-CM | POA: Diagnosis not present

## 2022-02-24 DIAGNOSIS — E119 Type 2 diabetes mellitus without complications: Secondary | ICD-10-CM | POA: Diagnosis not present

## 2022-02-24 DIAGNOSIS — E669 Obesity, unspecified: Secondary | ICD-10-CM | POA: Diagnosis not present

## 2022-02-24 DIAGNOSIS — D649 Anemia, unspecified: Secondary | ICD-10-CM | POA: Diagnosis not present

## 2022-02-24 DIAGNOSIS — Z794 Long term (current) use of insulin: Secondary | ICD-10-CM | POA: Insufficient documentation

## 2022-02-24 DIAGNOSIS — C49A3 Gastrointestinal stromal tumor of small intestine: Secondary | ICD-10-CM | POA: Diagnosis not present

## 2022-02-24 DIAGNOSIS — I509 Heart failure, unspecified: Secondary | ICD-10-CM | POA: Diagnosis not present

## 2022-02-24 DIAGNOSIS — Z87891 Personal history of nicotine dependence: Secondary | ICD-10-CM | POA: Diagnosis not present

## 2022-02-24 DIAGNOSIS — Z9981 Dependence on supplemental oxygen: Secondary | ICD-10-CM | POA: Diagnosis not present

## 2022-02-24 DIAGNOSIS — D500 Iron deficiency anemia secondary to blood loss (chronic): Secondary | ICD-10-CM | POA: Diagnosis not present

## 2022-02-24 DIAGNOSIS — Z86718 Personal history of other venous thrombosis and embolism: Secondary | ICD-10-CM | POA: Insufficient documentation

## 2022-02-24 DIAGNOSIS — I272 Pulmonary hypertension, unspecified: Secondary | ICD-10-CM | POA: Diagnosis not present

## 2022-02-24 DIAGNOSIS — E785 Hyperlipidemia, unspecified: Secondary | ICD-10-CM | POA: Diagnosis not present

## 2022-02-24 DIAGNOSIS — Z8042 Family history of malignant neoplasm of prostate: Secondary | ICD-10-CM | POA: Diagnosis not present

## 2022-02-24 DIAGNOSIS — I110 Hypertensive heart disease with heart failure: Secondary | ICD-10-CM | POA: Insufficient documentation

## 2022-02-24 DIAGNOSIS — Z7982 Long term (current) use of aspirin: Secondary | ICD-10-CM | POA: Diagnosis not present

## 2022-02-24 DIAGNOSIS — H409 Unspecified glaucoma: Secondary | ICD-10-CM | POA: Diagnosis not present

## 2022-02-24 DIAGNOSIS — Z7984 Long term (current) use of oral hypoglycemic drugs: Secondary | ICD-10-CM | POA: Insufficient documentation

## 2022-02-24 DIAGNOSIS — K219 Gastro-esophageal reflux disease without esophagitis: Secondary | ICD-10-CM | POA: Insufficient documentation

## 2022-02-24 DIAGNOSIS — J4489 Other specified chronic obstructive pulmonary disease: Secondary | ICD-10-CM | POA: Diagnosis not present

## 2022-02-24 DIAGNOSIS — D509 Iron deficiency anemia, unspecified: Secondary | ICD-10-CM | POA: Insufficient documentation

## 2022-02-24 DIAGNOSIS — E611 Iron deficiency: Secondary | ICD-10-CM | POA: Diagnosis not present

## 2022-02-24 LAB — CBC WITH DIFFERENTIAL/PLATELET
Abs Immature Granulocytes: 0.01 10*3/uL (ref 0.00–0.07)
Basophils Absolute: 0 10*3/uL (ref 0.0–0.1)
Basophils Relative: 0 %
Eosinophils Absolute: 0 10*3/uL (ref 0.0–0.5)
Eosinophils Relative: 1 %
HCT: 36.9 % (ref 36.0–46.0)
Hemoglobin: 12 g/dL (ref 12.0–15.0)
Immature Granulocytes: 0 %
Lymphocytes Relative: 14 %
Lymphs Abs: 0.8 10*3/uL (ref 0.7–4.0)
MCH: 31.2 pg (ref 26.0–34.0)
MCHC: 32.5 g/dL (ref 30.0–36.0)
MCV: 95.8 fL (ref 80.0–100.0)
Monocytes Absolute: 0.4 10*3/uL (ref 0.1–1.0)
Monocytes Relative: 8 %
Neutro Abs: 4.4 10*3/uL (ref 1.7–7.7)
Neutrophils Relative %: 77 %
Platelets: 205 10*3/uL (ref 150–400)
RBC: 3.85 MIL/uL — ABNORMAL LOW (ref 3.87–5.11)
RDW: 13.7 % (ref 11.5–15.5)
WBC: 5.7 10*3/uL (ref 4.0–10.5)
nRBC: 0 % (ref 0.0–0.2)

## 2022-02-24 LAB — COMPREHENSIVE METABOLIC PANEL
ALT: 16 U/L (ref 0–44)
AST: 21 U/L (ref 15–41)
Albumin: 3.9 g/dL (ref 3.5–5.0)
Alkaline Phosphatase: 49 U/L (ref 38–126)
Anion gap: 8 (ref 5–15)
BUN: 20 mg/dL (ref 8–23)
CO2: 26 mmol/L (ref 22–32)
Calcium: 8.8 mg/dL — ABNORMAL LOW (ref 8.9–10.3)
Chloride: 103 mmol/L (ref 98–111)
Creatinine, Ser: 0.99 mg/dL (ref 0.44–1.00)
GFR, Estimated: 59 mL/min — ABNORMAL LOW (ref >60)
Glucose, Bld: 171 mg/dL — ABNORMAL HIGH (ref 70–99)
Potassium: 4 mmol/L (ref 3.5–5.1)
Sodium: 137 mmol/L (ref 135–145)
Total Bilirubin: 0.5 mg/dL (ref 0.3–1.2)
Total Protein: 7.3 g/dL (ref 6.5–8.1)

## 2022-02-24 LAB — FERRITIN: Ferritin: 34 ng/mL (ref 11–307)

## 2022-02-24 LAB — IRON AND TIBC
Iron: 65 ug/dL (ref 28–170)
Saturation Ratios: 20 % (ref 10.4–31.8)
TIBC: 323 ug/dL (ref 250–450)
UIBC: 258 ug/dL

## 2022-02-24 NOTE — Progress Notes (Signed)
Patient here for oncology follow-up appointment, expresses no new concerns

## 2022-02-24 NOTE — Progress Notes (Signed)
[facility] NOTE  Patient Care Team: Albina Billet, MD as PCP - General (Unknown Physician Specialty) Wellington Hampshire, MD as PCP - Cardiology (Cardiology) Christene Lye, MD (General Surgery) Albina Billet, MD (Internal Medicine) Delana Meyer, Dolores Lory, MD (Vascular Surgery) Wilford Corner, MD as Consulting Physician (Gastroenterology) Cammie Sickle, MD as Consulting Physician (Hematology and Oncology)  CHIEF COMPLAINTS/PURPOSE OF CONSULTATION: GIST   HEMATOLOGY HISTORY  Oncology History Overview Note  NOV 2021- CT Bx- TUMOR  Tumor Site: Peritoneum, jejunal  Histologic Type: Gastrointestinal stromal tumor, spindle cell type  Mitotic Rate: 1 per 5 mm squared  Histologic Grade: G1, low grade (mitotic rate less than or equal to 5  per 5 mm squared)- STAGE T2N1 [possible mesneteric LN]; CT A/P-9.8 x9.8 cm  # NOV 29th 2021- GLEEVEC 400 mg/day neoadjuvant therapy 6 months.   SURGICAL PATHOLOGY  CASE: [id number]  PATIENT: Susan Barajas  Surgical Pathology Report      Specimen Submitted:  A. Lymph node, mesenteric  B. Lymph node, Proximal mesenteric  C. Small bowel with GIST   Clinical History: GIST small bowel       DIAGNOSIS:  A. LYMPH NODE, MESENTERIC; EXCISIONAL BIOPSY:  - REACTIVE LYMPHOID FOLLICULAR HYPERPLASIA WITH PROGRESSIVE  TRANSFORMATION OF GERMINAL CENTERS.  - NEGATIVE FOR MALIGNANCY, ONE LYMPH NODE (0/1).   B. LYMPH NODE, PROXIMAL MESENTERIC; EXCISIONAL BIOPSY:  - REACTIVE LYMPHOID FOLLICULAR HYPERPLASIA.  - NEGATIVE FOR MALIGNANCY, TWO LYMPH NODES (0/2).   C. SMALL BOWEL WITH GIST; RESECTION:  - GASTROINTESTINAL STROMAL TUMOR.  - SEE CANCER SUMMARY BELOW.   CASE SUMMARY: (GASTROINTESTINAL STROMAL TUMOR (GIST): Resection)  Standard(s): AJCC-UICC 8   CLINICAL  Pre-resection Treatment: Previous biopsy, systemic therapy performed  (Gleevec)   SPECIMEN  Procedure: Resection (partial small bowel)    TUMOR  Tumor Focality:  Unifocal  Tumor Site: Jejunum  Tumor Size: 9.7 x 9.2 x 6.3 cm  Histologic Type: Gastrointestinal stromal tumor, spindle cell type  Mitotic Rate:  Specify mitotic rate per 5 mm squared: Less than 5 per 5  mm squared  Histologic Grade: G1, low grade  Necrosis: Not identified  Treatment Effect: Present, approximately 30% viable tumor remains  Risk Assessment: Moderate (24%) risk of progressive disease   MARGINS:  Margin status:  Margin(s) involved by GIST: Deep, see comment.   Comment:  Tumor is present on ink on the periphery of the tumor nodule.  The  nature of the specimen precludes accurate assessment of where the tumor  may have been attached in situ (apart from the small bowel attachment).  Proximal and distal bowel margins cannot be reliably assessed, given  that they were received detached from the tumor specimen.   REGIONAL LYMPH NODES  Regional lymph node status:  Regional lymph nodes present, all regional lymph nodes negative for  tumor.  Number of lymph nodes examined: Three    # CHRONIC ANEMIA EGD/capsule-WNL-2018 [Dr.vanga]-UGIB ; colonoscopy-2018 [[facility]] ; capsule-not done; hemoglobin around 8.8; iron saturation 5% [Dr.Tate].  Poor tolerance of p.o. 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   GIST (gastrointestinal stromal tumor) of small bowel, malignant ([place])  03/12/2020 Initial Diagnosis   GIST (gastrointestinal stromal tumor) of small bowel, malignant ([facility])     HISTORY OF PRESENTING ILLNESS: Alone; ambulating independently. 6 L of oxygen nasal cannula.    Susan Barajas 76 y.o.  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  Patient tolerating [place] well.  Denies any issues with fluid retention. Denies any weight loss,, nausea or vomiting, bleeding  Review of Systems  Constitutional:  Positive for malaise/fatigue. Negative for chills, diaphoresis, fever and weight loss.  HENT:  Negative for nosebleeds and sore throat.   Eyes:  Negative for double vision.  Respiratory:  Positive for shortness of breath. Negative for cough, hemoptysis, sputum production and wheezing.   Cardiovascular:  Negative for chest pain, palpitations, orthopnea and leg swelling.  Gastrointestinal:  Negative for abdominal pain, blood in stool, constipation, diarrhea, heartburn, melena, nausea and vomiting.  Genitourinary:  Negative for dysuria, frequency and urgency.  Musculoskeletal:  Negative for back pain and joint pain.  Skin: Negative.  Negative for itching and rash.  Neurological:  Negative for dizziness, tingling, focal weakness, weakness and headaches.  Endo/Heme/Allergies:  Does not bruise/bleed easily.  Psychiatric/Behavioral:  Negative for depression. The patient is not nervous/anxious and does not have insomnia.     MEDICAL HISTORY:  Past Medical History:  Diagnosis Date   Anemia    Asthma 1950   CHF (congestive heart failure) (HCC)    COPD (chronic obstructive pulmonary disease) (HCC)    Cor pulmonale (HCC)    DVT (deep venous thrombosis) ([place]) 2018   GERD (gastroesophageal reflux disease)    GIST (gastrointestinal stromal tumor) of small bowel, malignant (HCC)    Glaucoma 1949   Hyperlipidemia    Hypertension    Obesity, unspecified    OSA treated with BiPAP    Personal history of tobacco use, presenting hazards to health    Pneumonia    Presence of IVC filter    Pulmonary HTN (HCC)    Supplemental oxygen dependent    T2DM (type 2 diabetes mellitus) ([place])    Varicose veins of  lower extremities with other complications     SURGICAL HISTORY: Past Surgical History:  Procedure Laterality Date   BREAST BIOPSY Right 1991   BREAST BIOPSY Left 2013   COLONOSCOPY  2010   Dr. Vira Agar   COLONOSCOPY  Jan 2016   Dr Vira Agar   ESOPHAGOGASTRODUODENOSCOPY N/A 11/26/2016   Procedure: ESOPHAGOGASTRODUODENOSCOPY (EGD);  Surgeon: Lin Landsman, MD;  Location: [facility] ENDOSCOPY;  Service: Gastroenterology;  Laterality: N/A;   GIVENS CAPSULE STUDY N/A 11/26/2016   Procedure: GIVENS CAPSULE STUDY, if EGD negative, plan to drop capsule with scope if EGD negative;  Surgeon: Lin Landsman, MD;  Location: [facility] ENDOSCOPY;  Service: Gastroenterology;  Laterality: N/A;   IVC FILTER INSERTION N/A 11/14/2016   Procedure: IVC Filter Insertion;  Surgeon: Katha Cabal, MD;  Location: [facility] CV LAB;  Service: Cardiovascular;  Laterality: N/A;   PARTIAL COLECTOMY N/A 11/05/2020   Procedure: SMALL BOWEL RESECTION, REMOVAL OF GIST TUMOR AND LYMPH NODES, UMBILICAL HERNIA REPAIR;  Surgeon: Robert Bellow, MD;  Location: [facility] ORS;  Service: General;  Laterality: N/A;  small bowel resection, RNFA to assist; TAPP block per anesthesia   RIGHT/LEFT HEART CATH AND CORONARY ANGIOGRAPHY N/A 06/30/2019   Procedure: RIGHT/LEFT HEART CATH AND CORONARY ANGIOGRAPHY;  Surgeon: Aundra Dubin,  Elby Showers, MD;  Location: [facility] LAB;  Service: Cardiovascular;  Laterality: N/A;   VEIN SURGERY Right 2006   Vein Closure Procedure; RF ablation of right GSV    SOCIAL HISTORY: Social History   Socioeconomic History   Marital status: Single    Spouse name: Not on file   Number of children: 2   Years of education: Not on file   Highest education level: Not on file  Occupational History   Occupation: CELL    Employer: RETIRED  Tobacco Use   Smoking status: Former    Packs/day: 1.50    Years: 20.00    Total pack years: 30.00    Types: Cigarettes    Quit date: 1999    Years since quitting: 24.8    Smokeless tobacco: Never  Vaping Use   Vaping Use: Never used  Substance and Sexual Activity   Alcohol use: No    Alcohol/week: 0.0 standard drinks of alcohol   Drug use: No   Sexual activity: Not Currently  Other Topics Concern   Not on file  Social History Narrative   Independent at baseline. Lives at home with her brother; in [place]. Quit smoking 20 years; no alcohol.    Social Determinants of Health   Financial Resource Strain: Not on file  Food Insecurity: Not on file  Transportation Needs: Not on file  Physical Activity: Not on file  Stress: Not on file  Social Connections: Not on file  Intimate Partner Violence: Not on file    FAMILY HISTORY: Family History  Problem Relation Age of Onset   Prostate cancer Father    Other Sister        mouth cancer   Hypertension Mother     ALLERGIES:  has No Known Allergies.  MEDICATIONS:  Current Outpatient Medications  Medication Sig Dispense Refill   ACCU-CHEK AVIVA PLUS test strip 1 each 2 (two) times daily.     Accu-Chek Softclix Lancets lancets USE 1 TO CHECK GLUCOSE TWICE DAILY     acetaminophen (TYLENOL) 325 MG tablet Take 2 tablets (650 mg total) by mouth every 6 (six) hours as needed for mild pain (or Fever >/= 101).     aspirin 81 MG chewable tablet Chew 1 tablet (81 mg total) by mouth daily. 30 tablet 0   Clobetasol Prop Emollient Base (CLOBETASOL PROPIONATE E) 0.05 % emollient cream Apply 1 Application topically daily. Up to 5 days per week - avoid face, groin,underarms 60 g 1   dorzolamide-timolol (COSOPT) 2-0.5 % ophthalmic solution 1 drop 2 (two) times daily.     FARXIGA 10 MG TABS tablet Take 1 tablet by mouth once daily 90 tablet 3   ferrous gluconate (FERGON) 325 MG tablet Take 325 mg by mouth in the morning and at bedtime.      furosemide (LASIX) 40 MG tablet Take 40 mg by mouth daily.     imatinib (GLEEVEC) 100 MG tablet TAKE 4 TABLETS (400 MG TOTAL) BY MOUTH DAILY. TAKE WITH MEALS AND LARGE GLASS OF  WATER 120 tablet 1   insulin glargine (LANTUS) 100 UNIT/ML injection Inject 32 Units into the skin in the morning.     latanoprost (XALATAN) 0.005 % ophthalmic solution Place 1 drop into both eyes at bedtime.      lovastatin (MEVACOR) 20 MG tablet Take 20 mg by mouth every evening.      metFORMIN (GLUCOPHAGE) 1000 MG tablet Take 1,000 mg by mouth daily.      Multiple Vitamin (  MULTIVITAMIN) tablet Take 1 tablet by mouth daily.     omeprazole (PRILOSEC) 40 MG capsule Take 40 mg by mouth daily.     OPSUMIT 10 MG tablet Take 1 tablet daily. 30 tablet 11   potassium chloride SA (KLOR-CON) 20 MEQ tablet Take 1 tablet (20 mEq total) by mouth daily. 90 tablet 3   tadalafil, PAH, (ADCIRCA) 20 MG tablet TAKE 2 TABLETS DAILY. GENERIC FOR ADCIRCA 60 tablet 11   timolol (TIMOPTIC) 0.5 % ophthalmic solution Place 1 drop into both eyes 2 (two) times daily.      Tiotropium Bromide-Olodaterol (STIOLTO RESPIMAT) 2.5-2.5 MCG/ACT AERS INHALE 2 PUFFS BY MOUTH ONCE DAILY 4 g 3   No current facility-administered medications for this visit.      PHYSICAL EXAMINATION:   Vitals:   02/24/22 1314  BP: (!) 131/51  Pulse: 73  Resp: 17  Temp: 98.6 F (37 C)  SpO2: 100%   Filed Weights   02/24/22 1314  Weight: 202 lb 12.8 oz (92 kg)    Physical Exam HENT:     Head: Normocephalic and atraumatic.     Mouth/Throat:     Pharynx: No oropharyngeal exudate.  Eyes:     Pupils: Pupils are equal, round, and reactive to light.  Cardiovascular:     Rate and Rhythm: Normal rate and regular rhythm.  Pulmonary:     Effort: No respiratory distress.     Breath sounds: No wheezing.     Comments: Decreased breath sounds. Abdominal:     General: Bowel sounds are normal. There is no distension.     Palpations: Abdomen is soft. There is no mass.     Tenderness: There is no abdominal tenderness. There is no guarding or rebound.  Musculoskeletal:        General: No tenderness. Normal range of motion.     Cervical  back: Normal range of motion and neck supple.  Skin:    General: Skin is warm.  Neurological:     Mental Status: She is alert and oriented to person, place, and time.  Psychiatric:        Mood and Affect: Affect normal.    LABORATORY DATA:  I have reviewed the data as listed Lab Results  Component Value Date   WBC 5.7 02/24/2022   HGB 12.0 02/24/2022   HCT 36.9 02/24/2022   MCV 95.8 02/24/2022   PLT 205 02/24/2022   Recent Labs    04/26/21 1025 07/10/21 1127 07/26/21 1242 11/19/21 1017 11/22/21 1134 02/24/22 1242  NA 131*   < > 137 140 137 137  K 4.2   < > 4.2 3.7 3.8 4.0  CL 101   < > 103 109 102 103  CO2 25   < > _0 GLUCOSE 156*   < > 112* 136* 173* 171*  BUN 22   < > 24* 25* 22 20  CREATININE 0.86   < > 0.93 1.11* 1.16* 0.99  CALCIUM 8.4*   < > 9.1 8.9 8.3* 8.8*  GFRNONAA >60   < > >60 52* 49* 59*  PROT 7.3  --  7.2  --   --  7.3  ALBUMIN 3.8  --  3.9  --   --  3.9  AST 17  --  19  --   --  21  ALT 13  --  15  --   --  16  ALKPHOS 57  --  53  --   --  49  BILITOT 0.3  --  0.4  --   --  0.5   < > = values in this interval not displayed.      No results found.  Assessment and plan GIST (gastrointestinal stromal tumor) of small bowel, malignant ([place]) # GIST of small bowel-low grade STAGE II [T3N0]-s/p neoadjuvant therapy with Gleevec x6 months [November-end of June 2022]-s/p resection ~30% viable tumor noted post resection specimen.  Currently on adjuvant Gleevec [3-5 years].    # Tolerating Gleevec well except for mild side effects [diarrhea]; swelling in the feet [see below]   # Swelling in the feet-grade 1 Continue compression stockings/diuretics as needed.   #CHF [Dr.McClean, cards-[place]]/COPD home O2 4-6Lits;-borderline respiratory status-STABLE.    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  Today hemoglobin is 12. Iron panel NL. No venofer    # Myalgias/arthralgias question Gleevec versus others- recommend  ca+vit D; tylenol as needed.  STABLE.    # DISPOSITION:.   # HOLD venofer today # Follow up in 3  month-  MD; labs-cbc/cmp;iron studies/ferritin; possible venofer;Dr.B    All questions were answered. The patient knows to call the clinic with any problems, questions or concerns.    Jane Canary, MD 02/24/2022 7:08 PM

## 2022-03-04 ENCOUNTER — Ambulatory Visit: Payer: Medicare Other | Admitting: Internal Medicine

## 2022-03-11 ENCOUNTER — Other Ambulatory Visit: Payer: Self-pay | Admitting: Internal Medicine

## 2022-03-13 ENCOUNTER — Ambulatory Visit (INDEPENDENT_AMBULATORY_CARE_PROVIDER_SITE_OTHER): Payer: Medicare Other | Admitting: Dermatology

## 2022-03-13 DIAGNOSIS — L2489 Irritant contact dermatitis due to other agents: Secondary | ICD-10-CM | POA: Diagnosis not present

## 2022-03-13 DIAGNOSIS — L819 Disorder of pigmentation, unspecified: Secondary | ICD-10-CM | POA: Diagnosis not present

## 2022-03-13 DIAGNOSIS — R21 Rash and other nonspecific skin eruption: Secondary | ICD-10-CM

## 2022-03-13 NOTE — Progress Notes (Unsigned)
   Follow-Up Visit   Subjective  Susan Barajas is a 76 y.o. female who presents for the following: Follow-up (Rash at neck, given clobetasol cream to use and feels has impoved. ).  The following portions of the chart were reviewed this encounter and updated as appropriate:  Tobacco  Allergies  Meds  Problems  Med Hx  Surg Hx  Fam Hx     Review of Systems: No other skin or systemic complaints except as noted in HPI or Assessment and Plan.  Objective  Well appearing patient in no apparent distress; mood and affect are within normal limits.  A focused examination was performed including neck. Relevant physical exam findings are noted in the Assessment and Plan.   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   Continue as needed Clobetasol cream qd up to 5 days per week - avoid face, groin, underarms  can continue using for a few weeks and then discontinue if rash resolved. The discoloration may last for months.  Return in about 2 months (around 05/13/2022) for rash follow up.  IRuthell Rummage, CMA, am acting as scribe for Sarina Ser, MD.  Documentation: I have reviewed the above documentation for accuracy and completeness, and I agree with the above.  Sarina Ser, MD

## 2022-03-13 NOTE — Patient Instructions (Addendum)
Put flannel against skin attached to cpap strap   Start Clobetasol cream daily up to 5 days per week - avoid face, groin, underarms can continue using for another few weeks then decrease to 3 days a week     Due to recent changes in healthcare laws, you may see results of your pathology and/or laboratory studies on MyChart before the doctors have had a chance to review them. We understand that in some cases there may be results that are confusing or concerning to you. Please understand that not all results are received at the same time and often the doctors may need to interpret multiple results in order to provide you with the best plan of care or course of treatment. Therefore, we ask that you please give [name] 2 business days to thoroughly review all your results before contacting the office for clarification. Should we see a critical lab result, you will be contacted sooner.   If You Need Anything After Your Visit  If you have any questions or concerns for your doctor, please call our main line at [phone] and press option 4 to reach your doctor's medical assistant. If no one answers, please leave a voicemail as directed and we will return your call as soon as possible. Messages left after 4 pm will be answered the following business day.   You may also send [name] a message via [place]. We typically respond to MyChart messages within 1-2 business days.  For prescription refills, please ask your pharmacy to contact our office. Our fax number is [phone].  If you have an urgent issue when the clinic is closed that cannot wait until the next business day, you can page your doctor at the number below.    Please note that while we do our best to be available for urgent issues outside of office hours, we are not available 24/7.   If you have an urgent issue and are unable to reach [name], you may choose to seek medical care at your doctor's office, retail clinic, urgent care center, or emergency  room.  If you have a medical emergency, please immediately call 911 or go to the emergency department.  Pager Numbers  - Dr. Nehemiah Massed: [phone]  - Dr. Laurence Ferrari: [phone]  - Dr. Nicole Kindred: [phone]  In the event of inclement weather, please call our main line at [phone] for an update on the status of any delays or closures.  Dermatology Medication Tips: Please keep the boxes that topical medications come in in order to help keep track of the instructions about where and how to use these. Pharmacies typically print the medication instructions only on the boxes and not directly on the medication tubes.   If your medication is too expensive, please contact our office at [phone] option 4 or send [name] a message through [place].   We are unable to tell what your co-pay for medications will be in advance as this is different depending on your insurance coverage. However, we may be able to find a substitute medication at lower cost or fill out paperwork to get insurance to cover a needed medication.   If a prior authorization is required to get your medication covered by your insurance company, please allow [name] 1-2 business days to complete this process.  Drug prices often vary depending on where the prescription is filled and some pharmacies may offer cheaper prices.  The website www.goodrx.com contains coupons for medications through different pharmacies. The prices here do not account for  what the cost may be with help from insurance (it may be cheaper with your insurance), but the website can give you the price if you did not use any insurance.  - You can print the associated coupon and take it with your prescription to the pharmacy.  - You may also stop by our office during regular business hours and pick up a GoodRx coupon card.  - If you need your prescription sent electronically to a different pharmacy, notify our office through [facility] or by phone at [phone]  option 4.     Si Usted Necesita Algo Despus de Su Visita  Tambin puede enviarnos un mensaje a travs de Pharmacist, community. Por lo general respondemos a los mensajes de MyChart en el transcurso de 1 a 2 das hbiles.  Para renovar recetas, por favor pida a su farmacia que se ponga en contacto con nuestra oficina. Harland Dingwall de fax es [place] [phone].  Si tiene un asunto urgente cuando la clnica est cerrada y que no puede esperar hasta el siguiente da hbil, puede llamar/localizar a su doctor(a) al nmero que aparece a continuacin.   Por favor, tenga en cuenta que aunque hacemos todo lo posible para estar disponibles para asuntos urgentes fuera del horario de [place], no estamos disponibles las 24 horas del da, los 7 das de la [place].   Si tiene un problema urgente y no puede comunicarse con nosotros, puede optar por buscar atencin mdica  en el consultorio de su doctor(a), en una clnica privada, en un centro de atencin urgente o en una sala de emergencias.  Si tiene Engineering geologist, por favor llame inmediatamente al 911 o vaya a la sala de emergencias.  Nmeros de bper  - Dr. Nehemiah Massed: [phone]  - Dra. Moye: [phone]  - Dra. Nicole Kindred: [phone]  En caso de inclemencias del [place], por favor llame a Johnsie Kindred principal al [phone] para una actualizacin sobre el [place] de cualquier retraso o cierre.  Consejos para la medicacin en dermatologa: Por favor, guarde las cajas en las que vienen los medicamentos de uso tpico para ayudarle a seguir las instrucciones sobre dnde y cmo usarlos. Las farmacias generalmente imprimen las instrucciones del medicamento slo en las cajas y no directamente en los tubos del [place].   Si su medicamento es muy caro, por favor, pngase en contacto con Zigmund Daniel llamando al [phone] y presione la opcin 4 o envenos un mensaje a travs de Pharmacist, community.   No podemos decirle cul ser su copago por los medicamentos  por adelantado ya que esto es diferente dependiendo de la cobertura de su seguro. Sin embargo, es posible que podamos encontrar un medicamento sustituto a Electrical engineer un formulario para que el seguro cubra el medicamento que se considera necesario.   Si se requiere una autorizacin previa para que su compaa de seguros [place] su medicamento, por favor permtanos de 1 a 2 das hbiles para completar este proceso.  Los precios de los medicamentos varan con frecuencia dependiendo del Environmental consultant de dnde se surte la receta y alguna farmacias pueden ofrecer precios ms baratos.  El sitio web www.goodrx.com tiene cupones para medicamentos de Airline pilot. Los precios aqu no tienen en cuenta lo que podra costar con la ayuda del seguro (puede ser ms barato con su seguro), pero el sitio web puede darle el precio si no utiliz Research scientist (physical sciences).  - Puede imprimir el cupn correspondiente y llevarlo con su receta a la farmacia.  - Tambin puede  pasar por nuestra oficina durante el horario de atencin regular y Charity fundraiser una tarjeta de cupones de GoodRx.  - Si necesita que su receta se enve electrnicamente a una farmacia diferente, informe a nuestra oficina a travs de MyChart de [place] o por telfono llamando al [phone] y presione la opcin 4.

## 2022-03-14 ENCOUNTER — Other Ambulatory Visit (HOSPITAL_COMMUNITY): Payer: Self-pay

## 2022-03-17 ENCOUNTER — Other Ambulatory Visit: Payer: Self-pay | Admitting: Internal Medicine

## 2022-03-17 ENCOUNTER — Other Ambulatory Visit (HOSPITAL_COMMUNITY): Payer: Self-pay

## 2022-03-17 DIAGNOSIS — C49A3 Gastrointestinal stromal tumor of small intestine: Secondary | ICD-10-CM

## 2022-03-17 MED ORDER — IMATINIB MESYLATE 100 MG PO TABS
ORAL_TABLET | ORAL | 1 refills | Status: DC
  Filled 2022-03-17 – 2022-03-21 (×2): qty 120, 30d supply, fill #0
  Filled 2022-04-10: qty 120, 30d supply, fill #1

## 2022-03-21 ENCOUNTER — Other Ambulatory Visit (HOSPITAL_COMMUNITY): Payer: Self-pay

## 2022-03-21 ENCOUNTER — Other Ambulatory Visit: Payer: Self-pay

## 2022-03-24 ENCOUNTER — Encounter: Payer: Self-pay | Admitting: Internal Medicine

## 2022-03-24 ENCOUNTER — Ambulatory Visit (INDEPENDENT_AMBULATORY_CARE_PROVIDER_SITE_OTHER): Payer: Medicare Other | Admitting: Internal Medicine

## 2022-03-24 VITALS — BP 140/60 | HR 78 | Temp 98.0°F | Ht 62.5 in | Wt 200.8 lb

## 2022-03-24 DIAGNOSIS — R0689 Other abnormalities of breathing: Secondary | ICD-10-CM

## 2022-03-24 DIAGNOSIS — J449 Chronic obstructive pulmonary disease, unspecified: Secondary | ICD-10-CM

## 2022-03-24 DIAGNOSIS — J9611 Chronic respiratory failure with hypoxia: Secondary | ICD-10-CM

## 2022-03-24 DIAGNOSIS — G4733 Obstructive sleep apnea (adult) (pediatric): Secondary | ICD-10-CM | POA: Diagnosis not present

## 2022-03-24 NOTE — Progress Notes (Signed)
_0  ID: Susan Barajas, female    DOB: 06/23/1945, 76 y.o.   MRN: [id number]     SYNOPSIS 76 year old female former smoker followed for severe obstructive sleep apnea on nocturnal BiPAP and chronic respiratory failure on oxygen, COPD .  She has underlying pulmonary hypertension secondary to underlying sleep apnea, diastolic heart failure and cor pulmonale.  She is followed by cardiology and is on Opsumit and tadalafil COVID-19 infection with COVID-pneumonia in September 2020 Previous DVT in 2018 was on anticoagulation but had to be stopped due to refractory anemia with IVC filter placed  TEST/EVENTS :  RHC/LHC was done in 3/21, showing mild CAD; mean RA 12, PA 68/25 mean 42, mean PCWP 9, CI 4.1, PVR 4.13 WU.  V/Q scan in 3/21 did not show evidence for chronic PE.   High resolution chest CT in 3/21 did not show evidence for ILD, it did show mild-moderate emphysema.     pulmonary function testing in March 2021 showed moderate airflow obstruction with an FEV1 at 66%, ratio 56, FVC 91%, positive bronchodilator response.  Significant mid flow obstruction and reversibility.  DLCO 32%.  PET scan 09/2020 -Slight decrease in size of a left upper quadrant mass which demonstrates similar mild hypermetabolism. 2. A hypermetabolic ileocolic mesenteric node is slightly increased in size since the prior exam, suspicious metastatic disease. This could alternatively be reactive, given the appearance of the colon. 3. Chronic sigmoid wall thickening and diffuse colonic hypermetabolism in the setting of marked diverticulosis. Likely muscular hypertrophy and chronic diverticulitis. Correlate with any symptoms to suggest acute superimposed inflammation. Also correlate with colon cancer screening history to exclude underlying neoplasm.   CC  Follow-up COPD Follow-up OSA Follow-up chronic hypoxic respiratory failure Follow-up pulmonary hypertension   HPI Regarding COPD no exacerbation at this  time Respiratory insufficiency seems to be stable  Patient has underlying moderate COPD.  She was placed on Stiolto August 2021.  No exacerbation at this time No evidence of heart failure at this time No evidence or signs of infection at this time No respiratory distress No fevers, chills, nausea, vomiting, diarrhea No evidence of lower extremity edema No evidence hemoptysis  Patient says that this has really helped her feels that the inhaler has help with her breathing.   No increased albuterol use.    Regarding chronic hypoxic respiratory failure She has no shortness of breath at rest.  Gets winded with prolonged walking.   She is on oxygen 4 L at home.  This is on continuous flow.  When she is out away from home or doing activities with prolonged walking.  She uses her portable simply go and uses 6 pulsing oxygen. She denies any recent increased oxygen demands.  O2 saturations have maintained in the 90s  Regarding OSA Patient is on BiPAP at bedtime for underlying sleep apnea.  She says she cannot sleep without it.  She uses her BiPAP every single night.  Usually gets in about 8 hours.  BiPAP download shows excellent compliance with 100% usage.  Daily average usage at 7.5 hours.  Patient is on auto BiPAP IPAP at 18 cm H2O.  APAP at 13 cm H2O.  AHI is 1.2.   Regarding pulmonary hypertension She is followed by cardiology for pulmonary hypertension.  She remains on tadalafil and Opsumit. echo in May 2022 showed improved pulmonary artery pressures with PAP at 43 mmHg.  Patient says she does feel that her shortness of breath has improved since beginning these medications.  She  denies any orthopnea or increased leg swelling  Patient has been diagnosed recently with a gastrointestinal stromal tumor.  She has been followed by oncology.  Is currently on Gleevec.  She has upcoming abdominal surgery next week for tumor resection.  Status post resection July 2022 No postop pulmonary complications  noted     No Known Allergies  Immunization History  Administered Date(s) Administered   Fluad Quad(high Dose 65+) 03/14/2019   Influenza Split 01/25/2018   Influenza-Unspecified 01/09/2020   Moderna Sars-Covid-2 Vaccination 05/27/2019, 06/27/2019   Pneumococcal Polysaccharide-23 04/13/2019    Past Medical History:  Diagnosis Date   Anemia    Asthma 1950   CHF (congestive heart failure) (HCC)    COPD (chronic obstructive pulmonary disease) (HCC)    Cor pulmonale (HCC)    DVT (deep venous thrombosis) ([place]) 2018   GERD (gastroesophageal reflux disease)    GIST (gastrointestinal stromal tumor) of small bowel, malignant (HCC)    Glaucoma 1949   Hyperlipidemia    Hypertension    Obesity, unspecified    OSA treated with BiPAP    Personal history of tobacco use, presenting hazards to health    Pneumonia    Presence of IVC filter    Pulmonary HTN (HCC)    Supplemental oxygen dependent    T2DM (type 2 diabetes mellitus) ([place])    Varicose veins of lower extremities with other complications     Tobacco History: Social History   Tobacco Use  Smoking Status Former   Packs/day: 1.50   Years: 20.00   Total pack years: 30.00   Types: Cigarettes   Quit date: 1999   Years since quitting: 24.9  Smokeless Tobacco Never   Counseling given: Not Answered    Outpatient Medications Prior to Visit  Medication Sig Dispense Refill   ACCU-CHEK AVIVA PLUS test strip 1 each 2 (two) times daily.     Accu-Chek Softclix Lancets lancets USE 1 TO CHECK GLUCOSE TWICE DAILY     acetaminophen (TYLENOL) 325 MG tablet Take 2 tablets (650 mg total) by mouth every 6 (six) hours as needed for mild pain (or Fever >/= 101).     aspirin 81 MG chewable tablet Chew 1 tablet (81 mg total) by mouth daily. 30 tablet 0   Clobetasol Prop Emollient Base (CLOBETASOL PROPIONATE E) 0.05 % emollient cream Apply 1 Application topically daily. Up to 5 days per week - avoid face, groin,underarms 60 g 1    dorzolamide-timolol (COSOPT) 2-0.5 % ophthalmic solution 1 drop 2 (two) times daily.     FARXIGA 10 MG TABS tablet Take 1 tablet by mouth once daily 90 tablet 3   ferrous gluconate (FERGON) 325 MG tablet Take 325 mg by mouth in the morning and at bedtime.      furosemide (LASIX) 40 MG tablet Take 40 mg by mouth daily.     imatinib (GLEEVEC) 100 MG tablet TAKE 4 TABLETS (400 MG TOTAL) BY MOUTH DAILY. TAKE WITH MEALS AND LARGE GLASS OF WATER 120 tablet 1   insulin glargine (LANTUS) 100 UNIT/ML injection Inject 32 Units into the skin in the morning.     latanoprost (XALATAN) 0.005 % ophthalmic solution Place 1 drop into both eyes at bedtime.      lovastatin (MEVACOR) 20 MG tablet Take 20 mg by mouth every evening.      metFORMIN (GLUCOPHAGE) 1000 MG tablet Take 1,000 mg by mouth daily.      Multiple Vitamin (MULTIVITAMIN) tablet Take 1 tablet by mouth daily.  omeprazole (PRILOSEC) 40 MG capsule Take 40 mg by mouth daily.     OPSUMIT 10 MG tablet Take 1 tablet daily. 30 tablet 11   potassium chloride SA (KLOR-CON) 20 MEQ tablet Take 1 tablet (20 mEq total) by mouth daily. 90 tablet 3   STIOLTO RESPIMAT 2.5-2.5 MCG/ACT AERS INHALE 2 PUFFS BY MOUTH ONCE DAILY 4 g 0   tadalafil, PAH, (ADCIRCA) 20 MG tablet TAKE 2 TABLETS DAILY. GENERIC FOR ADCIRCA 60 tablet 11   timolol (TIMOPTIC) 0.5 % ophthalmic solution Place 1 drop into both eyes 2 (two) times daily.      No facility-administered medications prior to visit.    BP (!) 140/60 (BP Location: Left Arm, Cuff Size: Normal)   Pulse 78   Temp 98 F (36.7 C) (Temporal)   Ht 5' 2.5" (1.588 m)   Wt 200 lb 12.8 oz (91.1 kg)   SpO2 90%   BMI 36.14 kg/m     Review of Systems: Gen:  Denies  fever, sweats, chills weight loss  HEENT: Denies blurred vision, double vision, ear pain, eye pain, hearing loss, nose bleeds, sore throat Cardiac:  No dizziness, chest pain or heaviness, chest tightness,edema, No JVD Resp:   No cough, -sputum production,  -shortness of breath,-wheezing, -hemoptysis,  Other:  All other systems negative    Physical Examination:   General Appearance: No distress  EYES PERRLA, EOM intact.   NECK Supple, No JVD Pulmonary: normal breath sounds, No wheezing.  CardiovascularNormal S1,S2.  No m/r/g.   Abdomen: Benign, Soft, non-tender. ALL OTHER ROS ARE NEGATIVE      Lab Results:  CBC    Component Value Date/Time   WBC 5.7 02/24/2022 1242   RBC 3.85 (L) 02/24/2022 1242   HGB 12.0 02/24/2022 1242   HCT 36.9 02/24/2022 1242   HCT 27.1 (L) 11/25/2016 2153   PLT 205 02/24/2022 1242   MCV 95.8 02/24/2022 1242   MCH 31.2 02/24/2022 1242   MCHC 32.5 02/24/2022 1242   RDW 13.7 02/24/2022 1242   LYMPHSABS 0.8 02/24/2022 1242   MONOABS 0.4 02/24/2022 1242   EOSABS 0.0 02/24/2022 1242   BASOSABS 0.0 02/24/2022 1242    BMET    Component Value Date/Time   NA 137 02/24/2022 1242   K 4.0 02/24/2022 1242   CL 103 02/24/2022 1242   CO2 26 02/24/2022 1242   GLUCOSE 171 (H) 02/24/2022 1242   BUN 20 02/24/2022 1242   CREATININE 0.99 02/24/2022 1242   CALCIUM 8.8 (L) 02/24/2022 1242   GFRNONAA 59 (L) 02/24/2022 1242   GFRAA >60 10/28/2019 1213    BNP    Component Value Date/Time   BNP 57.4 11/19/2021 1017    ProBNP    Component Value Date/Time   PROBNP 819.0 (H) 04/13/2019 1712    Imaging: No results found.        Latest Ref Rng & Units 07/13/2019    1:18 PM  PFT Results  FVC-Pre L 2.04   FVC-Predicted Pre % 82   FVC-Post L 2.26   FVC-Predicted Post % 91   Pre FEV1/FVC % % 56   Post FEV1/FCV % % 56   FEV1-Pre L 1.14   FEV1-Predicted Pre % 59   FEV1-Post L 1.28   DLCO uncorrected ml/min/mmHg 6.67   DLCO UNC% % 32   DLVA Predicted % 49   TLC L 5.01   TLC % Predicted % 93   RV % Predicted % 127     No results found  for: "NITRICOXIDE"      Assessment & Plan:   76 year old African-American female seen today for multiple medical issues including end-stage COPD with  chronic hypoxic respiratory failure with diastolic heart failure and pulmonary hypertension with underlying sleep apnea with deconditioned state and obesity    OSA  Excellent control with an compliance on nocturnal BiPAP  Continue current settings  Compliance report reviewed in detail with patient  BiPAP 18/13  AHI reduced  Patient uses and benefits from therapy  COPD stable Continue inhalers as prescribed No exacerbation at this time  Chronic respiratory failure with hypoxia (HCC) Continue on oxygen 4 L continuous flow.  Use 6 L pulsing oxygen.  O2 saturation goals  greater than 88 to 90% Patient use and benefits from oxygen therapy    Pulmonary hypertension ([place]) Continue on current regimen.  Continue follow-up with cardiology    MEDICATION ADJUSTMENTS/LABS AND TESTS ORDERED: Excellent job keep up the great work!!!  Continue BiPAP as prescribed Continue oxygen as prescribed Continue inhalers as prescribed  Follow-up with cardiologist   Avoid secondhand smoke Avoid SICK contacts Please Mask when appropriate Please Keep up-to-date with vaccinations   CURRENT MEDICATIONS REVIEWED AT [place]   Patient  satisfied with Plan of action and management. All questions answered  Follow-up 1 year  Total time spent 32 minutes   Corrin Parker, M.D.  [facility]  Medical Director Elias-Fela Solis Director [facility] Cardio-Pulmonary Department

## 2022-03-24 NOTE — Patient Instructions (Addendum)
Excellent job A+  keep up the great work!!!  Continue BiPAP as prescribed Continue oxygen as prescribed Continue inhalers as prescribed  Follow-up with cardiologist   Avoid secondhand smoke Avoid SICK contacts Please Mask when appropriate Please Keep up-to-date with vaccinations

## 2022-03-26 ENCOUNTER — Ambulatory Visit (HOSPITAL_COMMUNITY)
Admission: RE | Admit: 2022-03-26 | Discharge: 2022-03-26 | Disposition: A | Discharge status: Home / Self Care | Payer: Medicare Other | Source: Ambulatory Visit | Attending: Cardiology | Admitting: Cardiology

## 2022-03-26 ENCOUNTER — Telehealth (HOSPITAL_COMMUNITY): Payer: Self-pay | Admitting: Pharmacist

## 2022-03-26 ENCOUNTER — Encounter (HOSPITAL_COMMUNITY): Payer: Self-pay | Admitting: Cardiology

## 2022-03-26 VITALS — BP 110/50 | HR 62 | Wt 202.0 lb

## 2022-03-26 DIAGNOSIS — Z7984 Long term (current) use of oral hypoglycemic drugs: Secondary | ICD-10-CM | POA: Insufficient documentation

## 2022-03-26 DIAGNOSIS — G4733 Obstructive sleep apnea (adult) (pediatric): Secondary | ICD-10-CM | POA: Diagnosis not present

## 2022-03-26 DIAGNOSIS — I110 Hypertensive heart disease with heart failure: Secondary | ICD-10-CM | POA: Insufficient documentation

## 2022-03-26 DIAGNOSIS — I2721 Secondary pulmonary arterial hypertension: Secondary | ICD-10-CM | POA: Insufficient documentation

## 2022-03-26 DIAGNOSIS — I272 Pulmonary hypertension, unspecified: Secondary | ICD-10-CM | POA: Diagnosis not present

## 2022-03-26 DIAGNOSIS — E119 Type 2 diabetes mellitus without complications: Secondary | ICD-10-CM | POA: Insufficient documentation

## 2022-03-26 DIAGNOSIS — Z86718 Personal history of other venous thrombosis and embolism: Secondary | ICD-10-CM | POA: Insufficient documentation

## 2022-03-26 DIAGNOSIS — I50810 Right heart failure, unspecified: Secondary | ICD-10-CM | POA: Diagnosis not present

## 2022-03-26 DIAGNOSIS — Z8616 Personal history of COVID-19: Secondary | ICD-10-CM | POA: Diagnosis not present

## 2022-03-26 DIAGNOSIS — J9611 Chronic respiratory failure with hypoxia: Secondary | ICD-10-CM | POA: Insufficient documentation

## 2022-03-26 DIAGNOSIS — I2510 Atherosclerotic heart disease of native coronary artery without angina pectoris: Secondary | ICD-10-CM | POA: Insufficient documentation

## 2022-03-26 DIAGNOSIS — Z794 Long term (current) use of insulin: Secondary | ICD-10-CM | POA: Insufficient documentation

## 2022-03-26 DIAGNOSIS — Z87891 Personal history of nicotine dependence: Secondary | ICD-10-CM | POA: Insufficient documentation

## 2022-03-26 DIAGNOSIS — J439 Emphysema, unspecified: Secondary | ICD-10-CM | POA: Diagnosis not present

## 2022-03-26 DIAGNOSIS — Z9981 Dependence on supplemental oxygen: Secondary | ICD-10-CM | POA: Insufficient documentation

## 2022-03-26 DIAGNOSIS — Z79899 Other long term (current) drug therapy: Secondary | ICD-10-CM | POA: Insufficient documentation

## 2022-03-26 LAB — BASIC METABOLIC PANEL
Anion gap: 8 (ref 5–15)
BUN: 18 mg/dL (ref 8–23)
CO2: 23 mmol/L (ref 22–32)
Calcium: 8.6 mg/dL — ABNORMAL LOW (ref 8.9–10.3)
Chloride: 104 mmol/L (ref 98–111)
Creatinine, Ser: 1.1 mg/dL — ABNORMAL HIGH (ref 0.44–1.00)
GFR, Estimated: 52 mL/min — ABNORMAL LOW (ref >60)
Glucose, Bld: 183 mg/dL — ABNORMAL HIGH (ref 70–99)
Potassium: 4.2 mmol/L (ref 3.5–5.1)
Sodium: 135 mmol/L (ref 135–145)

## 2022-03-26 LAB — BRAIN NATRIURETIC PEPTIDE: B Natriuretic Peptide: 81.3 pg/mL (ref 0.0–100.0)

## 2022-03-26 NOTE — Telephone Encounter (Signed)
Advanced Heart Failure Patient Advocate Encounter  Prior Authorizations for Tadalafil and Opsumit have been approved.    Effective dates: 04/14/22 through 04/14/23  Audry Riles, PharmD, BCPS, BCCP, CPP Heart Failure Clinic Pharmacist [phone]

## 2022-03-26 NOTE — Patient Instructions (Signed)
There has been no changes to your medications.  Labs done today, your results will be available in MyChart, we will contact you for abnormal readings.  Your physician recommends that you schedule a follow-up appointment in: 4 months ( April 2024)  ** please call the office in February to arrange your follow up appointment **  If you have any questions or concerns before your next appointment please send [name] a message through [place] or call our office at [phone].    TO LEAVE A MESSAGE FOR THE NURSE SELECT OPTION 2, PLEASE LEAVE A MESSAGE INCLUDING: YOUR NAME DATE OF BIRTH CALL BACK NUMBER REASON FOR CALL**this is important as we prioritize the call backs  YOU WILL RECEIVE A CALL BACK THE SAME DAY AS LONG AS YOU CALL BEFORE 4:00 PM  At the [facility], you and your health needs are our priority. As part of our continuing mission to provide you with exceptional heart care, we have created designated Provider Care Teams. These Care Teams include your primary Cardiologist (physician) and Advanced Practice Providers (APPs- Physician Assistants and Nurse Practitioners) who all work together to provide you with the care you need, when you need it.   You may see any of the following providers on your designated Care Team at your next follow up: Dr Glori Bickers Dr Loralie Champagne Dr. Roxana Hires, NP Lyda Jester, [place] [facility] [place], [place] Forestine Na, NP Audry Riles, PharmD   Please be sure to bring in all your medications bottles to every appointment.

## 2022-03-26 NOTE — Progress Notes (Signed)
PCP: Albina Billet, MD Cardiology: Dr. Fletcher Anon HF Cardiology: Dr. Aundra Dubin  76 y.o. with history of chronic hypoxemic respiratory failure, OSA, and chronic diastolic CHF was referred by Dr. Fletcher Anon for evaluation of CHF and pulmonary hypertension.  Patient has been on home oxygen for 2-3 years per her report.  She was a smoker remotely. She had a DVT in 2018.  Anticoagulation was stopped due to refractory anemia and IVC filter was placed.  CT chest did not show a PE. She has OSA and uses CPAP.  Last echo in 1/21 showed normal LV EF with PASP 80 and severely dilated RV with mild RV dysfunction and D-shaped septum. She has been on Lasix for diuresis due to RV failure.    RHC/LHC was done in 3/21, showing mild CAD; mean RA 12, PA 68/25 mean 42, mean PCWP 9, CI 4.1, PVR 4.13 WU.  V/Q scan in 3/21 did not show evidence for chronic PE.  High resolution chest CT in 3/21 did not show evidence for ILD, it did show mild-moderate emphysema. PFTs (3/21) showed moderate-severe COPD.   She has been diagnosed with GI stromal tumor and is now on [place].   She was unable to tolerate Tyvaso due to intractable nausea and vomiting.   Echo 5/22 showed EF 60-65%, low normal RV function, mild RV enlargement, PASP 44 mmHg.  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   Echo in 8/23 showed EF 55-60%, RV mildly enlarged with normal systolic function, moderate LAE,  normal IVC, PASP 35 mmHg.   She returns for followup of pulmonary hypertension and RV failure.  Wearing home oxygen, 4L at rest and 6L with exertion, uses Bipap at night. No dyspnea with usual activities.  No chest pain.  No palpitations or lightheadedness.   6 minute walk (3/21): 182 m 6 minute walk (5/21): 243 m, oxygen saturation dropped to as low as 70s 6 minute walk (7/21): Only walked 3 min bc oxygen saturation dropped to 70s and CMA stopped walk, she went 121 m.  6 minute walk (3/22): 298 m 6 minute walk (3/23): 96 m with her home oxygen.   Labs  (12/20): BNP 819 Labs (2/21): K 4.4, creatinine 0.72 Labs (3/21): RF normal, ANA negative, anti-Jo-1 negative, anti-SCL-70 negative Labs (5/21): K 4.3, creatinine 0.88 Labs (6/21): hgb 10 Labs (3/22): BNP 89 Labs (4/22): K 3.9, creatinine 0.93 Labs (8/22): K 3.5, creatinine 0.72 Labs (10/22): K 4.8, creatinine 0.77, hgb 11.1 Labs (1/23): K 4.2, creatinine 0.86 Labs (4/23): K 4.2, creatinine 0.93, hgb 12  PMH: 1. COVID-19 PNA 8/20.  2. Type 2 diabetes 3. DVT: right leg, 2018.  - Has IVC filter, not anticoagulated due to h/o anemia.  4. HTN 5. Hyperlipidemia.  6. OSA: Uses Bipap.  7. COPD: Chronic hypoxemic respiratory failure, 3 L home oxygen.  8. Hyponatremia on HCTZ 9. Chronic diastolic CHF:  - Lexiscan Cardiolite in 2/19 was low risk no ischemia.  - Echo (1/21): EF 65-70%, no LVH, trivial MR, PASP 80 mmHg, severe RV enlargement with mildly decreased RV systolic function, D-shaped interventricular septum, dilated IVC.  - CTA chest (8/18): No evidence for PE.  10. Pulmonary hypertension: RHC/LHC 3/21 with mild CAD; mean RA 12, PA 68/25 mean 42, mean PCWP 9, CI 4.1, PVR 4.13 WU.  - Serologic workup negative.  - V/Q scan (3/21): No chronic PE.  - HRCT (3/21): No ILD, mild-moderate COPD. - PFTs (3/21): moderate-severe COPD - Echo (5/22): EF 60-65%, low normal RV function, mild  RV enlargement, PASP 44 mmHg - Echo (8/23): EF 55-60%, RV mildly enlarged with normal systolic function, moderate LAE,  normal IVC, PASP 35 mmHg.  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  Social History   Socioeconomic History   Marital status: Single    Spouse name: Not on file   Number of children: 2   Years of education: Not on file   Highest education level: Not on file  Occupational History   Occupation: CELL    Employer: RETIRED  Tobacco Use   Smoking status: Former    Packs/day: 1.50    Years: 20.00    Total pack years: 30.00    Types: Cigarettes     Quit date: 1999    Years since quitting: 24.9   Smokeless tobacco: Never  Vaping Use   Vaping Use: Never used  Substance and Sexual Activity   Alcohol use: No    Alcohol/week: 0.0 standard drinks of alcohol   Drug use: No   Sexual activity: Not Currently  Other Topics Concern   Not on file  Social History Narrative   Independent at baseline. Lives at home with her brother; in [place]. Quit smoking 20 years; no alcohol.    Social Determinants of Health   Financial Resource Strain: Not on file  Food Insecurity: Not on file  Transportation Needs: Not on file  Physical Activity: Not on file  Stress: Not on file  Social Connections: Not on file  Intimate Partner Violence: Not on file   Family History  Problem Relation Age of Onset   Prostate cancer Father    Other Sister        mouth cancer   Hypertension Mother    ROS: All systems reviewed and negative except as per HPI.   Current Outpatient Medications  Medication Sig Dispense Refill   ACCU-CHEK AVIVA PLUS test strip 1 each 2 (two) times daily.     Accu-Chek Softclix Lancets lancets USE 1 TO CHECK GLUCOSE TWICE DAILY     acetaminophen (TYLENOL) 325 MG tablet Take 2 tablets (650 mg total) by mouth every 6 (six) hours as needed for mild pain (or Fever >/= 101).     aspirin 81 MG chewable tablet Chew 1 tablet (81 mg total) by mouth daily. 30 tablet 0   Clobetasol Prop Emollient Base (CLOBETASOL PROPIONATE E) 0.05 % emollient cream Apply 1 Application topically daily. Up to 5 days per week - avoid face, groin,underarms 60 g 1   dorzolamide-timolol (COSOPT) 2-0.5 % ophthalmic solution 1 drop 2 (two) times daily.     FARXIGA 10 MG TABS tablet Take 1 tablet by mouth once daily 90 tablet 3   ferrous gluconate (FERGON) 325 MG tablet Take 325 mg by mouth in the morning and at bedtime.      furosemide (LASIX) 40 MG tablet Take 40 mg by mouth daily.     imatinib (GLEEVEC) 100 MG tablet TAKE 4 TABLETS (400 MG TOTAL) BY MOUTH DAILY.  TAKE WITH MEALS AND LARGE GLASS OF WATER 120 tablet 1   insulin glargine (LANTUS) 100 UNIT/ML injection Inject 32 Units into the skin in the morning.     latanoprost (XALATAN) 0.005 % ophthalmic solution Place 1 drop into both eyes at bedtime.      lovastatin (MEVACOR) 20 MG tablet Take 20 mg by mouth every evening.      metFORMIN (GLUCOPHAGE) 1000 MG tablet Take 1,000 mg by mouth daily.  Multiple Vitamin (MULTIVITAMIN) tablet Take 1 tablet by mouth daily.     omeprazole (PRILOSEC) 40 MG capsule Take 40 mg by mouth daily.     OPSUMIT 10 MG tablet Take 1 tablet daily. 30 tablet 11   potassium chloride SA (KLOR-CON) 20 MEQ tablet Take 1 tablet (20 mEq total) by mouth daily. 90 tablet 3   STIOLTO RESPIMAT 2.5-2.5 MCG/ACT AERS INHALE 2 PUFFS BY MOUTH ONCE DAILY 4 g 0   tadalafil, PAH, (ADCIRCA) 20 MG tablet TAKE 2 TABLETS DAILY. GENERIC FOR ADCIRCA 60 tablet 11   timolol (TIMOPTIC) 0.5 % ophthalmic solution Place 1 drop into both eyes 2 (two) times daily.      No current facility-administered medications for this encounter.   BP (!) 110/50   Pulse 62   Wt 91.6 kg (202 lb)   SpO2 90% Comment: 6 l n/c  BMI 36.36 kg/m  General: NAD Neck: JVP 7-8 cm, no thyromegaly or thyroid nodule.  Lungs: Mild crackles at bases.  CV: Nondisplaced PMI.  Heart regular S1/S2, no S3/S4, no murmur.  No peripheral edema.  No carotid bruit.  Normal pedal pulses.  Abdomen: Soft, nontender, no hepatosplenomegaly, no distention.  Skin: Intact without lesions or rashes.  Neurologic: Alert and oriented x 3.  Psych: Normal affect. Extremities: No clubbing or cyanosis.  HEENT: Normal.   Assessment/Plan: 1. RV failure: Echo in 1/21 showed significant RV dysfunction with severe pulmonary hypertension and D-shaped interventricular septum.  Based on RHC, it looks like RV failure is due to pulmonary arterial hypertension rather than LV failure (normal PCWP).  Repeated echo in 5/22 showed EF 60-65%, low normal RV  function, mild RV enlargement, PASP 44 mmHg. Echo in 8/23 showed EF 55-60%, RV mildly enlarged with normal systolic function, moderate LAE, normal IVC, PASP 35 mmHg. She is not volume overloaded on exam, NYHA class II.  - Continue current Lasix.  Check BMET/BNP today.   - Continue Farxiga.  2. Pulmonary HTN: Echo in 1/21 with RV failure and estimate PA systolic pressure 80 mmHg.  RHC in 3/21 showed pulmonary arterial hypertension.  Serologic workup was negative.  V/Q scan showed no chronic PE.  HRCT showed no ILD, mild-moderate emphysema.  PFTs suggestive of moderate COPD. Pulmonary hypertension seems out of proportion to parenchymal disease, suspect significant group 1 component to her [place] in addition to group 3 (COPD).  She has been symptomatically improved with selective pulmonary vasodilators.  She was unable to tolerate Tyvaso.  - Continue to use Bipap at night, oxygen during the day.  - Continue Opsumit 10 mg daily.  - Continue tadalafil 40 mg daily.  - Check BNP today.   - Will have to defer 6 minute walk today due to construction.   3. H/o DVT: 2018.  She has IVC filter and is not anticoagulated due to h/o anemia.  4. Type 2 diabetes: She is now on Farxiga.  5. Chronic hypoxemic respiratory failure: Home oxygen.  Moderate COPD on PFTs, mild-moderate emphysema on HRCT.  Suspect pulmonary arterial hypertension also plays a role.  - Continue home oxygen.  Followup in 4 months with APP.    Loralie Champagne 03/26/2022

## 2022-03-29 ENCOUNTER — Encounter: Payer: Self-pay | Admitting: Dermatology

## 2022-04-10 ENCOUNTER — Other Ambulatory Visit (HOSPITAL_COMMUNITY): Payer: Self-pay

## 2022-04-13 ENCOUNTER — Other Ambulatory Visit: Payer: Self-pay | Admitting: Internal Medicine

## 2022-04-16 ENCOUNTER — Other Ambulatory Visit: Payer: Self-pay

## 2022-05-07 ENCOUNTER — Encounter: Payer: Self-pay | Admitting: Internal Medicine

## 2022-05-08 ENCOUNTER — Other Ambulatory Visit (HOSPITAL_COMMUNITY): Payer: Self-pay

## 2022-05-12 ENCOUNTER — Other Ambulatory Visit (HOSPITAL_COMMUNITY): Payer: Self-pay

## 2022-05-14 ENCOUNTER — Other Ambulatory Visit (HOSPITAL_COMMUNITY): Payer: Self-pay

## 2022-05-14 ENCOUNTER — Ambulatory Visit (INDEPENDENT_AMBULATORY_CARE_PROVIDER_SITE_OTHER): Payer: 59 | Admitting: Dermatology

## 2022-05-14 VITALS — BP 144/67 | HR 76

## 2022-05-14 DIAGNOSIS — Z79899 Other long term (current) drug therapy: Secondary | ICD-10-CM

## 2022-05-14 DIAGNOSIS — L2489 Irritant contact dermatitis due to other agents: Secondary | ICD-10-CM

## 2022-05-14 DIAGNOSIS — L819 Disorder of pigmentation, unspecified: Secondary | ICD-10-CM | POA: Diagnosis not present

## 2022-05-14 NOTE — Patient Instructions (Signed)
Due to recent changes in healthcare laws, you may see results of your pathology and/or laboratory studies on MyChart before the doctors have had a chance to review them. We understand that in some cases there may be results that are confusing or concerning to you. Please understand that not all results are received at the same time and often the doctors may need to interpret multiple results in order to provide you with the best plan of care or course of treatment. Therefore, we ask that you please give us 2 business days to thoroughly review all your results before contacting the office for clarification. Should we see a critical lab result, you will be contacted sooner.   If You Need Anything After Your Visit  If you have any questions or concerns for your doctor, please call our main line at [phone] and press option 4 to reach your doctor's medical assistant. If no one answers, please leave a voicemail as directed and we will return your call as soon as possible. Messages left after 4 pm will be answered the following business day.   You may also send us a message via MyChart. We typically respond to MyChart messages within 1-2 business days.  For prescription refills, please ask your pharmacy to contact our office. Our fax number is [phone].  If you have an urgent issue when the clinic is closed that cannot wait until the next business day, you can page your doctor at the number below.    Please note that while we do our best to be available for urgent issues outside of office hours, we are not available 24/7.   If you have an urgent issue and are unable to reach us, you may choose to seek medical care at your doctor's office, retail clinic, urgent care center, or emergency room.  If you have a medical emergency, please immediately call 911 or go to the emergency department.  Pager Numbers  - Dr. Kowalski: [phone]  - Dr. Moye: [phone]  - Dr. Stewart:  [phone]  In the event of inclement weather, please call our main line at [phone] for an update on the status of any delays or closures.  Dermatology Medication Tips: Please keep the boxes that topical medications come in in order to help keep track of the instructions about where and how to use these. Pharmacies typically print the medication instructions only on the boxes and not directly on the medication tubes.   If your medication is too expensive, please contact our office at [phone] option 4 or send us a message through MyChart.   We are unable to tell what your co-pay for medications will be in advance as this is different depending on your insurance coverage. However, we may be able to find a substitute medication at lower cost or fill out paperwork to get insurance to cover a needed medication.   If a prior authorization is required to get your medication covered by your insurance company, please allow us 1-2 business days to complete this process.  Drug prices often vary depending on where the prescription is filled and some pharmacies may offer cheaper prices.  The website www.goodrx.com contains coupons for medications through different pharmacies. The prices here do not account for what the cost may be with help from insurance (it may be cheaper with your insurance), but the website can give you the price if you did not use any insurance.  - You can print the associated coupon and take it with   your prescription to the pharmacy.  - You may also stop by our office during regular business hours and pick up a GoodRx coupon card.  - If you need your prescription sent electronically to a different pharmacy, notify our office through [facility] MyChart or by phone at [phone] option 4.     Si Usted Necesita Algo Despus de Su Visita  Tambin puede enviarnos un mensaje a travs de MyChart. Por lo general respondemos a los mensajes de MyChart en el transcurso de 1 a 2  das hbiles.  Para renovar recetas, por favor pida a su farmacia que se ponga en contacto con nuestra oficina. Nuestro nmero de fax es el [phone].  Si tiene un asunto urgente cuando la clnica est cerrada y que no puede esperar hasta el siguiente da hbil, puede llamar/localizar a su doctor(a) al nmero que aparece a continuacin.   Por favor, tenga en cuenta que aunque hacemos todo lo posible para estar disponibles para asuntos urgentes fuera del horario de oficina, no estamos disponibles las 24 horas del da, los 7 das de la semana.   Si tiene un problema urgente y no puede comunicarse con nosotros, puede optar por buscar atencin mdica  en el consultorio de su doctor(a), en una clnica privada, en un centro de atencin urgente o en una sala de emergencias.  Si tiene una emergencia mdica, por favor llame inmediatamente al 911 o vaya a la sala de emergencias.  Nmeros de bper  - Dr. Kowalski: [phone]  - Dra. Moye: [phone]  - Dra. Stewart: [phone]  En caso de inclemencias del tiempo, por favor llame a nuestra lnea principal al [phone] para una actualizacin sobre el estado de cualquier retraso o cierre.  Consejos para la medicacin en dermatologa: Por favor, guarde las cajas en las que vienen los medicamentos de uso tpico para ayudarle a seguir las instrucciones sobre dnde y cmo usarlos. Las farmacias generalmente imprimen las instrucciones del medicamento slo en las cajas y no directamente en los tubos del medicamento.   Si su medicamento es muy caro, por favor, pngase en contacto con nuestra oficina llamando al [phone] y presione la opcin 4 o envenos un mensaje a travs de MyChart.   No podemos decirle cul ser su copago por los medicamentos por adelantado ya que esto es diferente dependiendo de la cobertura de su seguro. Sin embargo, es posible que podamos encontrar un medicamento sustituto a menor costo o llenar un formulario para que el  seguro cubra el medicamento que se considera necesario.   Si se requiere una autorizacin previa para que su compaa de seguros cubra su medicamento, por favor permtanos de 1 a 2 das hbiles para completar este proceso.  Los precios de los medicamentos varan con frecuencia dependiendo del lugar de dnde se surte la receta y alguna farmacias pueden ofrecer precios ms baratos.  El sitio web www.goodrx.com tiene cupones para medicamentos de diferentes farmacias. Los precios aqu no tienen en cuenta lo que podra costar con la ayuda del seguro (puede ser ms barato con su seguro), pero el sitio web puede darle el precio si no utiliz ningn seguro.  - Puede imprimir el cupn correspondiente y llevarlo con su receta a la farmacia.  - Tambin puede pasar por nuestra oficina durante el horario de atencin regular y recoger una tarjeta de cupones de GoodRx.  - Si necesita que su receta se enve electrnicamente a una farmacia diferente, informe a nuestra oficina a travs de MyChart de [place]   o por telfono llamando al [phone] y presione la opcin 4.  

## 2022-05-14 NOTE — Progress Notes (Signed)
   Follow-Up Visit   Subjective  Susan Barajas is a 77 y.o. female who presents for the following: Irritant Contact Derm w/ dyschromia (Post neck, 25mf/u, Clobetasol cr 3x/wk).  The following portions of the chart were reviewed this encounter and updated as appropriate:   Tobacco  Allergies  Meds  Problems  Med Hx  Surg Hx  Fam Hx     Review of Systems:  No other skin or systemic complaints except as noted in HPI or Assessment and Plan.  Objective  Well appearing patient in no apparent distress; mood and affect are within normal limits.  A focused examination was performed including posterior neck. Relevant physical exam findings are noted in the Assessment and Plan.  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 Your physician has selected the right strength medicine for your problem and area affected on the body. Please use your medication only as directed by your physician to prevent side effects.    Return if symptoms worsen or fail to improve.  I, SOthelia Pulling RMA, am acting as scribe for DSarina Ser MD . Documentation: I have reviewed the above documentation for accuracy and completeness, and I agree with the above.  DSarina Ser MD

## 2022-05-16 ENCOUNTER — Other Ambulatory Visit (HOSPITAL_COMMUNITY): Payer: Self-pay | Admitting: Cardiology

## 2022-05-17 ENCOUNTER — Encounter: Payer: Self-pay | Admitting: Dermatology

## 2022-05-30 ENCOUNTER — Other Ambulatory Visit: Payer: Self-pay | Admitting: Internal Medicine

## 2022-05-30 ENCOUNTER — Other Ambulatory Visit (HOSPITAL_COMMUNITY): Payer: Self-pay

## 2022-05-30 ENCOUNTER — Other Ambulatory Visit: Payer: Self-pay

## 2022-05-30 ENCOUNTER — Other Ambulatory Visit: Payer: Self-pay | Admitting: Pharmacist

## 2022-05-30 DIAGNOSIS — C49A3 Gastrointestinal stromal tumor of small intestine: Secondary | ICD-10-CM

## 2022-05-30 MED ORDER — IMATINIB MESYLATE 100 MG PO TABS
ORAL_TABLET | ORAL | 1 refills | Status: DC
  Filled 2022-05-30: qty 120, 30d supply, fill #0
  Filled 2022-06-24: qty 120, 30d supply, fill #1

## 2022-06-02 ENCOUNTER — Other Ambulatory Visit (HOSPITAL_COMMUNITY): Payer: Self-pay

## 2022-06-02 MED FILL — Iron Sucrose Inj 20 MG/ML (Fe Equiv): INTRAVENOUS | Qty: 10 | Status: AC

## 2022-06-03 ENCOUNTER — Inpatient Hospital Stay: Payer: 59

## 2022-06-03 ENCOUNTER — Inpatient Hospital Stay: Payer: 59 | Attending: Internal Medicine

## 2022-06-03 ENCOUNTER — Inpatient Hospital Stay (HOSPITAL_BASED_OUTPATIENT_CLINIC_OR_DEPARTMENT_OTHER): Payer: 59 | Admitting: Internal Medicine

## 2022-06-03 ENCOUNTER — Encounter: Payer: Self-pay | Admitting: Internal Medicine

## 2022-06-03 VITALS — BP 126/68 | HR 67 | Temp 97.0°F | Resp 18 | Wt 206.8 lb

## 2022-06-03 DIAGNOSIS — C49A3 Gastrointestinal stromal tumor of small intestine: Secondary | ICD-10-CM | POA: Diagnosis not present

## 2022-06-03 DIAGNOSIS — Z87891 Personal history of nicotine dependence: Secondary | ICD-10-CM | POA: Diagnosis not present

## 2022-06-03 DIAGNOSIS — Z9981 Dependence on supplemental oxygen: Secondary | ICD-10-CM | POA: Insufficient documentation

## 2022-06-03 DIAGNOSIS — I110 Hypertensive heart disease with heart failure: Secondary | ICD-10-CM | POA: Diagnosis not present

## 2022-06-03 DIAGNOSIS — Z8042 Family history of malignant neoplasm of prostate: Secondary | ICD-10-CM | POA: Diagnosis not present

## 2022-06-03 DIAGNOSIS — J4489 Other specified chronic obstructive pulmonary disease: Secondary | ICD-10-CM | POA: Insufficient documentation

## 2022-06-03 DIAGNOSIS — D509 Iron deficiency anemia, unspecified: Secondary | ICD-10-CM | POA: Diagnosis not present

## 2022-06-03 DIAGNOSIS — E611 Iron deficiency: Secondary | ICD-10-CM

## 2022-06-03 DIAGNOSIS — I509 Heart failure, unspecified: Secondary | ICD-10-CM | POA: Diagnosis not present

## 2022-06-03 DIAGNOSIS — M7989 Other specified soft tissue disorders: Secondary | ICD-10-CM | POA: Insufficient documentation

## 2022-06-03 DIAGNOSIS — E119 Type 2 diabetes mellitus without complications: Secondary | ICD-10-CM | POA: Diagnosis not present

## 2022-06-03 LAB — IRON AND TIBC
Iron: 38 ug/dL (ref 28–170)
Saturation Ratios: 12 % (ref 10.4–31.8)
TIBC: 328 ug/dL (ref 250–450)
UIBC: 290 ug/dL

## 2022-06-03 LAB — COMPREHENSIVE METABOLIC PANEL
ALT: 16 U/L (ref 0–44)
AST: 21 U/L (ref 15–41)
Albumin: 3.9 g/dL (ref 3.5–5.0)
Alkaline Phosphatase: 49 U/L (ref 38–126)
Anion gap: 9 (ref 5–15)
BUN: 30 mg/dL — ABNORMAL HIGH (ref 8–23)
CO2: 25 mmol/L (ref 22–32)
Calcium: 8.9 mg/dL (ref 8.9–10.3)
Chloride: 105 mmol/L (ref 98–111)
Creatinine, Ser: 1.12 mg/dL — ABNORMAL HIGH (ref 0.44–1.00)
GFR, Estimated: 51 mL/min — ABNORMAL LOW (ref >60)
Glucose, Bld: 113 mg/dL — ABNORMAL HIGH (ref 70–99)
Potassium: 3.7 mmol/L (ref 3.5–5.1)
Sodium: 139 mmol/L (ref 135–145)
Total Bilirubin: 0.5 mg/dL (ref 0.3–1.2)
Total Protein: 7.3 g/dL (ref 6.5–8.1)

## 2022-06-03 LAB — CBC WITH DIFFERENTIAL/PLATELET
Abs Immature Granulocytes: 0.01 10*3/uL (ref 0.00–0.07)
Basophils Absolute: 0 10*3/uL (ref 0.0–0.1)
Basophils Relative: 1 %
Eosinophils Absolute: 0 10*3/uL (ref 0.0–0.5)
Eosinophils Relative: 0 %
HCT: 35.2 % — ABNORMAL LOW (ref 36.0–46.0)
Hemoglobin: 11.5 g/dL — ABNORMAL LOW (ref 12.0–15.0)
Immature Granulocytes: 0 %
Lymphocytes Relative: 18 %
Lymphs Abs: 0.9 10*3/uL (ref 0.7–4.0)
MCH: 31.1 pg (ref 26.0–34.0)
MCHC: 32.7 g/dL (ref 30.0–36.0)
MCV: 95.1 fL (ref 80.0–100.0)
Monocytes Absolute: 0.5 10*3/uL (ref 0.1–1.0)
Monocytes Relative: 10 %
Neutro Abs: 3.9 10*3/uL (ref 1.7–7.7)
Neutrophils Relative %: 71 %
Platelets: 205 10*3/uL (ref 150–400)
RBC: 3.7 MIL/uL — ABNORMAL LOW (ref 3.87–5.11)
RDW: 14.5 % (ref 11.5–15.5)
WBC: 5.4 10*3/uL (ref 4.0–10.5)
nRBC: 0 % (ref 0.0–0.2)

## 2022-06-03 LAB — FERRITIN: Ferritin: 32 ng/mL (ref 11–307)

## 2022-06-03 NOTE — Progress Notes (Signed)
[facility] NOTE  Patient Care Team: Albina Billet, MD as PCP - General (Unknown Physician Specialty) Wellington Hampshire, MD as PCP - Cardiology (Cardiology) Christene Lye, MD (General Surgery) Albina Billet, MD (Internal Medicine) Delana Meyer, Dolores Lory, MD (Vascular Surgery) Wilford Corner, MD as Consulting Physician (Gastroenterology) Cammie Sickle, MD as Consulting Physician (Hematology and Oncology)  CHIEF COMPLAINTS/PURPOSE OF CONSULTATION: GIST   HEMATOLOGY HISTORY  Oncology History Overview Note  NOV 2021- CT Bx- TUMOR  Tumor Site: Peritoneum, jejunal  Histologic Type: Gastrointestinal stromal tumor, spindle cell type  Mitotic Rate: 1 per 5 mm squared  Histologic Grade: G1, low grade (mitotic rate less than or equal to 5  per 5 mm squared)- STAGE T2N1 [possible mesneteric LN]; CT A/P-9.8 x9.8 cm  # NOV 29th 2021- GLEEVEC 400 mg/day neoadjuvant therapy 6 months.   SURGICAL PATHOLOGY  CASE: [id number]  PATIENT: Susan Barajas  Surgical Pathology Report      Specimen Submitted:  A. Lymph node, mesenteric  B. Lymph node, Proximal mesenteric  C. Small bowel with GIST   Clinical History: GIST small bowel       DIAGNOSIS:  A. LYMPH NODE, MESENTERIC; EXCISIONAL BIOPSY:  - REACTIVE LYMPHOID FOLLICULAR HYPERPLASIA WITH PROGRESSIVE  TRANSFORMATION OF GERMINAL CENTERS.  - NEGATIVE FOR MALIGNANCY, ONE LYMPH NODE (0/1).   B. LYMPH NODE, PROXIMAL MESENTERIC; EXCISIONAL BIOPSY:  - REACTIVE LYMPHOID FOLLICULAR HYPERPLASIA.  - NEGATIVE FOR MALIGNANCY, TWO LYMPH NODES (0/2).   C. SMALL BOWEL WITH GIST; RESECTION:  - GASTROINTESTINAL STROMAL TUMOR.  - SEE CANCER SUMMARY BELOW.   CASE SUMMARY: (GASTROINTESTINAL STROMAL TUMOR (GIST): Resection)  Standard(s): AJCC-UICC 8   CLINICAL  Pre-resection Treatment: Previous biopsy, systemic therapy performed  (Gleevec)   SPECIMEN  Procedure: Resection (partial small bowel)    TUMOR  Tumor Focality:  Unifocal  Tumor Site: Jejunum  Tumor Size: 9.7 x 9.2 x 6.3 cm  Histologic Type: Gastrointestinal stromal tumor, spindle cell type  Mitotic Rate:  Specify mitotic rate per 5 mm squared: Less than 5 per 5  mm squared  Histologic Grade: G1, low grade  Necrosis: Not identified  Treatment Effect: Present, approximately 30% viable tumor remains  Risk Assessment: Moderate (24%) risk of progressive disease   MARGINS:  Margin status:  Margin(s) involved by GIST: Deep, see comment.   Comment:  Tumor is present on ink on the periphery of the tumor nodule.  The  nature of the specimen precludes accurate assessment of where the tumor  may have been attached in situ (apart from the small bowel attachment).  Proximal and distal bowel margins cannot be reliably assessed, given  that they were received detached from the tumor specimen.   REGIONAL LYMPH NODES  Regional lymph node status:  Regional lymph nodes present, all regional lymph nodes negative for  tumor.  Number of lymph nodes examined: Three    # CHRONIC ANEMIA EGD/capsule-WNL-2018 [Dr.vanga]-UGIB ; colonoscopy-2018 [[facility]] ; capsule-not done; hemoglobin around 8.8; iron saturation 5% [Dr.Tate].  Poor tolerance of p.o. 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   GIST (gastrointestinal stromal tumor) of small bowel, malignant ([place])  03/12/2020 Initial Diagnosis   GIST (gastrointestinal stromal tumor) of small bowel, malignant ([facility])     HISTORY OF PRESENTING ILLNESS: Alone; ambulating independently. 6 L of oxygen nasal cannula.    Susan Barajas 77 y.o.  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  Daily loose stools is well controlled with PRN Imodium.  Patient denies any worsening swelling of the legs.  She continues to wear compression stockings.  Intermittently on diuretics. Denies any nausea vomiting from the [place].  Review of Systems  Constitutional:  Positive for malaise/fatigue. Negative for chills, diaphoresis, fever and weight loss.  HENT:  Negative for nosebleeds and sore throat.   Eyes:  Negative for double vision.  Respiratory:  Positive for shortness of breath. Negative for cough, hemoptysis, sputum production and wheezing.   Cardiovascular:  Negative for chest pain, palpitations, orthopnea and leg swelling.  Gastrointestinal:  Negative for abdominal pain, blood in stool, constipation, diarrhea, heartburn, melena, nausea and vomiting.  Genitourinary:  Negative for dysuria, frequency and urgency.  Musculoskeletal:  Negative for back pain and joint pain.  Skin: Negative.  Negative for itching and rash.  Neurological:  Negative for dizziness, tingling, focal weakness, weakness and headaches.  Endo/Heme/Allergies:  Does not bruise/bleed easily.  Psychiatric/Behavioral:  Negative for depression. The patient is not nervous/anxious and does not have insomnia.     MEDICAL HISTORY:  Past Medical History:  Diagnosis Date   Anemia    Asthma 1950   CHF (congestive heart failure) (HCC)    COPD (chronic obstructive pulmonary disease) (HCC)    Cor pulmonale (HCC)    DVT (deep venous thrombosis) ([place]) 2018   GERD (gastroesophageal reflux disease)    GIST (gastrointestinal stromal tumor) of small bowel, malignant (HCC)    Glaucoma 1949   Hyperlipidemia    Hypertension    Obesity, unspecified    OSA treated with BiPAP    Personal history of tobacco use, presenting hazards to health    Pneumonia    Presence of IVC filter    Pulmonary HTN (HCC)     Supplemental oxygen dependent    T2DM (type 2 diabetes mellitus) ([place])    Varicose veins of lower extremities with other complications     SURGICAL HISTORY: Past Surgical History:  Procedure Laterality Date   BREAST BIOPSY Right 1991   BREAST BIOPSY Left 2013   COLONOSCOPY  2010   Dr. Vira Agar   COLONOSCOPY  Jan 2016   Dr Vira Agar   ESOPHAGOGASTRODUODENOSCOPY N/A 11/26/2016   Procedure: ESOPHAGOGASTRODUODENOSCOPY (EGD);  Surgeon: Lin Landsman, MD;  Location[facility] END[facility]COPY;  Service: Gastroenterology;  Laterality: N/A;   GIVENS CAPSULE STUDY N/A 11/26/2016   Procedure: GIVENS CAPSULE STUDY, if EGD negative, plan to drop capsule with scope if EGD negative;  Surgeon: Lin Landsman, MD;  Location: [facility] ENDOSCOPY;  Service: Gastroenterology;  Laterality: N/A;   IVC FILTER INSERTION N/A 11/14/2016   Procedure: IVC Filter Insertion;  Surgeon: Katha Cabal, MD;  Location: [place] CV LAB;  Service: Cardiovascular;  Laterality: N/A;   PARTIAL COLECTOMY N/A 11/05/2020   Procedure: SMALL BOWEL RESECTION, REMOVAL OF GIST TUMOR AND LYMPH NODES, UMBILICAL HERNIA REPAIR;  Surgeon: Robert Bellow, MD;  Location: [facility] ORS;  Service: General;  Laterality: N/A;  small bowel resection, RNFA to assist; TAPP block per anesthesia   RIGHT/LEFT HEART CATH  AND CORONARY ANGIOGRAPHY N/A 06/30/2019   Procedure: RIGHT/LEFT HEART CATH AND CORONARY ANGIOGRAPHY;  Surgeon: Larey Dresser, MD;  Location: [place] CV LAB;  Service: Cardiovascular;  Laterality: N/A;   VEIN SURGERY Right 2006   Vein Closure Procedure; RF ablation of right GSV    SOCIAL HISTORY: Social History   Socioeconomic History   Marital status: Single    Spouse name: Not on file   Number of children: 2   Years of education: Not on file   Highest education level: Not on file  Occupational History   Occupation: CELL    Employer: RETIRED  Tobacco Use   Smoking status: Former    Packs/day: 1.50    Years: 20.00     Total pack years: 30.00    Types: Cigarettes    Quit date: 1999    Years since quitting: 25.1   Smokeless tobacco: Never  Vaping Use   Vaping Use: Never used  Substance and Sexual Activity   Alcohol use: No    Alcohol/week: 0.0 standard drinks of alcohol   Drug use: No   Sexual activity: Not Currently  Other Topics Concern   Not on file  Social History Narrative   Independent at baseline. Lives at home with her brother; in [place]. Quit smoking 20 years; no alcohol.    Social Determinants of Health   Financial Resource Strain: Not on file  Food Insecurity: Not on file  Transportation Needs: Not on file  Physical Activity: Not on file  Stress: Not on file  Social Connections: Not on file  Intimate Partner Violence: Not on file    FAMILY HISTORY: Family History  Problem Relation Age of Onset   Prostate cancer Father    Other Sister        mouth cancer   Hypertension Mother     ALLERGIES:  has No Known Allergies.  MEDICATIONS:  Current Outpatient Medications  Medication Sig Dispense Refill   ACCU-CHEK AVIVA PLUS test strip 1 each 2 (two) times daily.     Accu-Chek Softclix Lancets lancets USE 1 TO CHECK GLUCOSE TWICE DAILY     acetaminophen (TYLENOL) 325 MG tablet Take 2 tablets (650 mg total) by mouth every 6 (six) hours as needed for mild pain (or Fever >/= 101).     aspirin 81 MG chewable tablet Chew 1 tablet (81 mg total) by mouth daily. 30 tablet 0   Clobetasol Prop Emollient Base (CLOBETASOL PROPIONATE E) 0.05 % emollient cream Apply 1 Application topically daily. Up to 5 days per week - avoid face, groin,underarms 60 g 1   dorzolamide-timolol (COSOPT) 2-0.5 % ophthalmic solution 1 drop 2 (two) times daily.     FARXIGA 10 MG TABS tablet Take 1 tablet by mouth once daily 90 tablet 3   ferrous gluconate (FERGON) 325 MG tablet Take 325 mg by mouth in the morning and at bedtime.      furosemide (LASIX) 40 MG tablet Take 40 mg by mouth daily.     imatinib  (GLEEVEC) 100 MG tablet TAKE 4 TABLETS (400 MG TOTAL) BY MOUTH DAILY. TAKE WITH MEALS AND LARGE GLASS OF WATER 120 tablet 1   insulin glargine (LANTUS) 100 UNIT/ML injection Inject 32 Units into the skin in the morning.     latanoprost (XALATAN) 0.005 % ophthalmic solution Place 1 drop into both eyes at bedtime.      loperamide (IMODIUM) 2 MG capsule Take by mouth as needed for diarrhea or loose stools.  lovastatin (MEVACOR) 20 MG tablet Take 20 mg by mouth every evening.      metFORMIN (GLUCOPHAGE) 1000 MG tablet Take 1,000 mg by mouth daily.      Multiple Vitamin (MULTIVITAMIN) tablet Take 1 tablet by mouth daily.     omeprazole (PRILOSEC) 40 MG capsule Take 40 mg by mouth daily.     OPSUMIT 10 MG tablet TAKE 1 TABLET DAILY. 30 tablet 11   potassium chloride SA (KLOR-CON) 20 MEQ tablet Take 1 tablet (20 mEq total) by mouth daily. 90 tablet 3   tadalafil, PAH, (ADCIRCA) 20 MG tablet TAKE 2 TABLETS DAILY. GENERIC FOR ADCIRCA 60 tablet 11   timolol (TIMOPTIC) 0.5 % ophthalmic solution Place 1 drop into both eyes 2 (two) times daily.      Tiotropium Bromide-Olodaterol (STIOLTO RESPIMAT) 2.5-2.5 MCG/ACT AERS INHALE 2 PUFFS BY MOUTH ONCE DAILY 4 g 2   No current facility-administered medications for this visit.      PHYSICAL EXAMINATION:   Vitals:   06/03/22 1300  BP: 126/68  Pulse: 67  Resp: 18  Temp: (!) 97 F (36.1 C)   Filed Weights   06/03/22 1300  Weight: 206 lb 12.8 oz (93.8 kg)    Physical Exam HENT:     Head: Normocephalic and atraumatic.     Mouth/Throat:     Pharynx: No oropharyngeal exudate.  Eyes:     Pupils: Pupils are equal, round, and reactive to light.  Cardiovascular:     Rate and Rhythm: Normal rate and regular rhythm.  Pulmonary:     Effort: No respiratory distress.     Breath sounds: No wheezing.     Comments: Decreased breath sounds. Abdominal:     General: Bowel sounds are normal. There is no distension.     Palpations: Abdomen is soft. There is  no mass.     Tenderness: There is no abdominal tenderness. There is no guarding or rebound.  Musculoskeletal:        General: No tenderness. Normal range of motion.     Cervical back: Normal range of motion and neck supple.  Skin:    General: Skin is warm.  Neurological:     Mental Status: She is alert and oriented to person, place, and time.  Psychiatric:        Mood and Affect: Affect normal.     LABORATORY DATA:  I have reviewed the data as listed Lab Results  Component Value Date   WBC 5.4 06/03/2022   HGB 11.5 (L) 06/03/2022   HCT 35.2 (L) 06/03/2022   MCV 95.1 06/03/2022   PLT 205 06/03/2022   Recent Labs    07/26/21 1242 11/19/21 1017 02/24/22 1242 03/26/22 0947 06/03/22 1311  NA 137   < > 137 135 139  K 4.2   < > 4.0 4.2 3.7  CL 103   < > 103 104 105  CO2 27   < > 26 23 25  $ GLUCOSE 112*   < > 171* 183* 113*  BUN 24*   < > 20 18 30*  CREATININE 0.93   < > 0.99 1.10* 1.12*  CALCIUM 9.1   < > 8.8* 8.6* 8.9  GFRNONAA >60   < > 59* 52* 51*  PROT 7.2  --  7.3  --  7.3  ALBUMIN 3.9  --  3.9  --  3.9  AST 19  --  21  --  21  ALT 15  --  16  --  16  ALKPHOS 53  --  49  --  49  BILITOT 0.4  --  0.5  --  0.5   < > = values in this interval not displayed.     No results found.   GIST (gastrointestinal stromal tumor) of small bowel, malignant ([place]) # GIST of small bowel-low grade STAGE II [T3N0]-s/p neoadjuvant therapy with Gleevec x6 months [November-end of June 2022]-s/p resection ~30% viable tumor noted post resection specimen.  Currently on adjuvant Gleevec [3-5 years].  No evidence of recurrence.  Consider CT imaging next visit.  # Tolerating Gleevec well except for mild side effects [diarrhea]; swelling in the feet [see below]  # Swelling in the feet-grade 1 Continue compression stockings/diuretics as needed.STABLE.   #CHF [Dr.McClean, cards-[place]]/COPD home O2 4-6Lits;-borderline respiratory status-STABLE.   #Iron deficient anemia-  sec to small bowel  GIST-currently s/p resection.  Today hemoglobin is   11.5. discussed with pt- NOV 2023- I sat-20%;Ferritin-33. Wants to hold off today. Will re-assess at next visit.   # Myalgias/arthralgias question Gleevec versus others- recommend ca+vit D; tylenol as needed- STABLE.    # DISPOSITION:.   # HOLD venofer today # Follow up in 3  month-  MD; labs-cbc/cmp;iron studies/ferritin; possible venofer;Dr.B      All questions were answered. The patient knows to call the clinic with any problems, questions or concerns.    Cammie Sickle, MD 06/03/2022 1:49 PM

## 2022-06-03 NOTE — Progress Notes (Signed)
Daily loose stools is well controlled with PRN Imodium.

## 2022-06-03 NOTE — Assessment & Plan Note (Addendum)
#   GIST of small bowel-low grade STAGE II [T3N0]-s/p neoadjuvant therapy with Gleevec x6 months [November-end of June 2022]-s/p resection ~30% viable tumor noted post resection specimen.  Currently on adjuvant Gleevec [3-5 years].  No evidence of recurrence.  Consider CT imaging next visit.  # Tolerating Gleevec well except for mild side effects [diarrhea]; swelling in the feet [see below]  # Swelling in the feet-grade 1 Continue compression stockings/diuretics as needed.STABLE.   #CHF [Dr.McClean, cards-[place]]/COPD home O2 4-6Lits;-borderline respiratory status-STABLE.   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  Today hemoglobin is   11.5. discussed with pt- NOV 2023- I sat-20%;Ferritin-33. Wants to hold off today. Will re-assess at next visit.   # Myalgias/arthralgias question Gleevec versus others- recommend ca+vit D; tylenol as needed- STABLE.    # DISPOSITION:.   # HOLD venofer today # Follow up in 3  month-  MD; labs-cbc/cmp;iron studies/ferritin; possible venofer;Dr.B

## 2022-06-19 ENCOUNTER — Other Ambulatory Visit (HOSPITAL_COMMUNITY): Payer: Self-pay

## 2022-06-24 ENCOUNTER — Other Ambulatory Visit (HOSPITAL_COMMUNITY): Payer: Self-pay

## 2022-06-27 ENCOUNTER — Other Ambulatory Visit: Payer: Self-pay

## 2022-07-22 ENCOUNTER — Other Ambulatory Visit: Payer: Self-pay | Admitting: Pharmacist

## 2022-07-22 ENCOUNTER — Other Ambulatory Visit (HOSPITAL_COMMUNITY): Payer: Self-pay

## 2022-07-22 ENCOUNTER — Other Ambulatory Visit: Payer: Self-pay

## 2022-07-22 DIAGNOSIS — C49A3 Gastrointestinal stromal tumor of small intestine: Secondary | ICD-10-CM

## 2022-07-22 MED ORDER — IMATINIB MESYLATE 100 MG PO TABS
ORAL_TABLET | ORAL | 1 refills | Status: DC
  Filled 2022-07-22: qty 120, 30d supply, fill #0
  Filled 2022-08-14: qty 120, 30d supply, fill #1

## 2022-07-27 ENCOUNTER — Other Ambulatory Visit: Payer: Self-pay | Admitting: Internal Medicine

## 2022-07-30 ENCOUNTER — Other Ambulatory Visit: Payer: Self-pay | Admitting: Internal Medicine

## 2022-08-11 ENCOUNTER — Other Ambulatory Visit (HOSPITAL_COMMUNITY): Payer: Self-pay | Admitting: Cardiology

## 2022-08-11 DIAGNOSIS — I272 Pulmonary hypertension, unspecified: Secondary | ICD-10-CM

## 2022-08-12 ENCOUNTER — Ambulatory Visit (HOSPITAL_COMMUNITY)
Admission: RE | Admit: 2022-08-12 | Discharge: 2022-08-12 | Disposition: A | Discharge status: Home / Self Care | Payer: 59 | Source: Ambulatory Visit | Attending: Cardiology | Admitting: Cardiology

## 2022-08-12 ENCOUNTER — Encounter (HOSPITAL_COMMUNITY): Payer: Self-pay | Admitting: Cardiology

## 2022-08-12 VITALS — BP 120/60 | HR 72 | Wt 211.2 lb

## 2022-08-12 DIAGNOSIS — I2510 Atherosclerotic heart disease of native coronary artery without angina pectoris: Secondary | ICD-10-CM | POA: Diagnosis not present

## 2022-08-12 DIAGNOSIS — Z86718 Personal history of other venous thrombosis and embolism: Secondary | ICD-10-CM | POA: Diagnosis not present

## 2022-08-12 DIAGNOSIS — I2721 Secondary pulmonary arterial hypertension: Secondary | ICD-10-CM | POA: Insufficient documentation

## 2022-08-12 DIAGNOSIS — Z86711 Personal history of pulmonary embolism: Secondary | ICD-10-CM | POA: Insufficient documentation

## 2022-08-12 DIAGNOSIS — I5032 Chronic diastolic (congestive) heart failure: Secondary | ICD-10-CM | POA: Insufficient documentation

## 2022-08-12 DIAGNOSIS — J9611 Chronic respiratory failure with hypoxia: Secondary | ICD-10-CM | POA: Diagnosis not present

## 2022-08-12 DIAGNOSIS — I110 Hypertensive heart disease with heart failure: Secondary | ICD-10-CM | POA: Insufficient documentation

## 2022-08-12 DIAGNOSIS — Z7984 Long term (current) use of oral hypoglycemic drugs: Secondary | ICD-10-CM | POA: Insufficient documentation

## 2022-08-12 DIAGNOSIS — Z9981 Dependence on supplemental oxygen: Secondary | ICD-10-CM | POA: Insufficient documentation

## 2022-08-12 DIAGNOSIS — E119 Type 2 diabetes mellitus without complications: Secondary | ICD-10-CM | POA: Insufficient documentation

## 2022-08-12 DIAGNOSIS — G4733 Obstructive sleep apnea (adult) (pediatric): Secondary | ICD-10-CM | POA: Diagnosis not present

## 2022-08-12 DIAGNOSIS — Z79899 Other long term (current) drug therapy: Secondary | ICD-10-CM | POA: Insufficient documentation

## 2022-08-12 DIAGNOSIS — Z794 Long term (current) use of insulin: Secondary | ICD-10-CM | POA: Insufficient documentation

## 2022-08-12 DIAGNOSIS — J439 Emphysema, unspecified: Secondary | ICD-10-CM | POA: Insufficient documentation

## 2022-08-12 DIAGNOSIS — I272 Pulmonary hypertension, unspecified: Secondary | ICD-10-CM | POA: Diagnosis not present

## 2022-08-12 DIAGNOSIS — Z8616 Personal history of COVID-19: Secondary | ICD-10-CM | POA: Insufficient documentation

## 2022-08-12 DIAGNOSIS — Z87891 Personal history of nicotine dependence: Secondary | ICD-10-CM | POA: Insufficient documentation

## 2022-08-12 LAB — BASIC METABOLIC PANEL
Anion gap: 9 (ref 5–15)
BUN: 23 mg/dL (ref 8–23)
CO2: 26 mmol/L (ref 22–32)
Calcium: 10.1 mg/dL (ref 8.9–10.3)
Chloride: 104 mmol/L (ref 98–111)
Creatinine, Ser: 1.18 mg/dL — ABNORMAL HIGH (ref 0.44–1.00)
GFR, Estimated: 48 mL/min — ABNORMAL LOW (ref >60)
Glucose, Bld: 91 mg/dL (ref 70–99)
Potassium: 4.4 mmol/L (ref 3.5–5.1)
Sodium: 139 mmol/L (ref 135–145)

## 2022-08-12 LAB — BRAIN NATRIURETIC PEPTIDE: B Natriuretic Peptide: 97.9 pg/mL (ref 0.0–100.0)

## 2022-08-12 LAB — LIPID PANEL
Cholesterol: 125 mg/dL (ref 0–200)
HDL: 44 mg/dL (ref >40)
LDL Cholesterol: 63 mg/dL (ref 0–99)
Total CHOL/HDL Ratio: 2.8 RATIO
Triglycerides: 90 mg/dL (ref <150)
VLDL: 18 mg/dL (ref 0–40)

## 2022-08-12 NOTE — Progress Notes (Signed)
6 Min Walk Test Completed  Pt ambulated 426.7 meters O2 Sat ranged 94%-82 on 4-6 L oxygen HR ranged 66-137

## 2022-08-12 NOTE — Progress Notes (Signed)
PCP: Jaclyn Shaggy, MD Cardiology: Dr. Kirke Corin HF Cardiology: Dr. Shirlee Latch  77 y.o. with history of chronic hypoxemic respiratory failure, OSA, and chronic diastolic CHF was referred by Dr. Kirke Corin for evaluation of CHF and pulmonary hypertension.  Patient has been on home oxygen for 2-3 years per her report.  She was a smoker remotely. She had a DVT in 2018.  Anticoagulation was stopped due to refractory anemia and IVC filter was placed.  CT chest did not show a PE. She has OSA and uses CPAP.  Last echo in 1/21 showed normal LV EF with PASP 80 and severely dilated RV with mild RV dysfunction and D-shaped septum. She has been on Lasix for diuresis due to RV failure.    RHC/LHC was done in 3/21, showing mild CAD; mean RA 12, PA 68/25 mean 42, mean PCWP 9, CI 4.1, PVR 4.13 WU.  V/Q scan in 3/21 did not show evidence for chronic PE.  High resolution chest CT in 3/21 did not show evidence for ILD, it did show mild-moderate emphysema. PFTs (3/21) showed moderate-severe COPD.   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   She was unable to tolerate Tyvaso due to intractable nausea and vomiting.   Echo 5/22 showed EF 60-65%, low normal RV function, mild RV enlargement, PASP 44 mmHg.  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   Echo in 8/23 showed EF 55-60%, RV mildly enlarged with normal systolic function, moderate LAE,  normal IVC, PASP 35 mmHg.   She returns for followup of pulmonary hypertension and RV failure.  Wearing home oxygen, 4L at rest and 6L with exertion, uses Bipap at night. No dyspnea walking on flat ground and can get around the grocery store.  No chest pain.  No lightheadedness/syncope.  No palpitations.    ECG (personally reviewed): NSR with sinus arrhythmia  6 minute walk (3/21): 182 m 6 minute walk (5/21): 243 m, oxygen saturation dropped to as low as 70s 6 minute walk (7/21): Only walked 3 min bc oxygen saturation dropped to 70s and CMA stopped walk,  she went 121 m.  6 minute walk (3/22): 298 m 6 minute walk (3/23): 366 m with her home oxygen.  6 minute walk (4/24): 427 m with home oxygen  Labs (12/20): BNP 819 Labs (2/21): K 4.4, creatinine 0.72 Labs (3/21): RF normal, ANA negative, anti-Jo-1 negative, anti-SCL-70 negative Labs (5/21): K 4.3, creatinine 0.88 Labs (6/21): hgb 10 Labs (3/22): BNP 89 Labs (4/22): K 3.9, creatinine 0.93 Labs (8/22): K 3.5, creatinine 0.72 Labs (10/22): K 4.8, creatinine 0.77, hgb 11.1 Labs (1/23): K 4.2, creatinine 0.86 Labs (4/23): K 4.2, creatinine 0.93, hgb 12 Labs (2/24): K 3.7, creatinine 1.12  PMH: 1. COVID-19 PNA 8/20.  2. Type 2 diabetes 3. DVT: right leg, 2018.  - Has IVC filter, not anticoagulated due to h/o anemia.  4. HTN 5. Hyperlipidemia.  6. OSA: Uses Bipap.  7. COPD: Chronic hypoxemic respiratory failure, 3 L home oxygen.  8. Hyponatremia on HCTZ 9. Chronic diastolic CHF:  - Lexiscan Cardiolite in 2/19 was low risk no ischemia.  - Echo (1/21): EF 65-70%, no LVH, trivial MR, PASP 80 mmHg, severe RV enlargement with mildly decreased RV systolic function, D-shaped interventricular septum, dilated IVC.  - CTA chest (8/18): No evidence for PE.  10. Pulmonary hypertension: RHC/LHC 3/21 with mild CAD; mean RA 12, PA 68/25 mean 42, mean PCWP 9, CI 4.1, PVR 4.13 WU.  - Serologic  workup negative.  - V/Q scan (3/21): No chronic PE.  - HRCT (3/21): No ILD, mild-moderate COPD. - PFTs (3/21): moderate-severe COPD - Echo (5/22): EF 60-65%, low normal RV function, mild RV enlargement, PASP 44 mmHg - Echo (8/23): EF 55-60%, RV mildly enlarged with normal systolic function, moderate LAE,  normal IVC, PASP 35 mmHg.  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  Social History   Socioeconomic History   Marital status: Single    Spouse name: Not on file   Number of children: 2   Years of education: Not on file   Highest education level: Not on file   Occupational History   Occupation: CELL    Employer: RETIRED  Tobacco Use   Smoking status: Former    Packs/day: 1.50    Years: 20.00    Additional pack years: 0.00    Total pack years: 30.00    Types: Cigarettes    Quit date: 1999    Years since quitting: 25.3   Smokeless tobacco: Never  Vaping Use   Vaping Use: Never used  Substance and Sexual Activity   Alcohol use: No    Alcohol/week: 0.0 standard drinks of alcohol   Drug use: No   Sexual activity: Not Currently  Other Topics Concern   Not on file  Social History Narrative   Independent at baseline. Lives at home with her brother; in . Quit smoking 20 years; no alcohol.    Social Determinants of Health   Financial Resource Strain: Not on file  Food Insecurity: Not on file  Transportation Needs: Not on file  Physical Activity: Not on file  Stress: Not on file  Social Connections: Not on file  Intimate Partner Violence: Not on file   Family History  Problem Relation Age of Onset   Prostate cancer Father    Other Sister        mouth cancer   Hypertension Mother    ROS: All systems reviewed and negative except as per HPI.   Current Outpatient Medications  Medication Sig Dispense Refill   ACCU-CHEK AVIVA PLUS test strip 1 each 2 (two) times daily.     Accu-Chek Softclix Lancets lancets USE 1 TO CHECK GLUCOSE TWICE DAILY     acetaminophen (TYLENOL) 325 MG tablet Take 2 tablets (650 mg total) by mouth every 6 (six) hours as needed for mild pain (or Fever >/= 101).     aspirin 81 MG chewable tablet Chew 1 tablet (81 mg total) by mouth daily. 30 tablet 0   clobetasol cream (TEMOVATE) 0.05 % Apply 1 Application topically 3 (three) times a week.     dorzolamide-timolol (COSOPT) 2-0.5 % ophthalmic solution 1 drop 2 (two) times daily.     FARXIGA 10 MG TABS tablet Take 1 tablet by mouth once daily 90 tablet 3   ferrous gluconate (FERGON) 325 MG tablet Take 325 mg by mouth in the morning and at bedtime.       furosemide (LASIX) 40 MG tablet Take 40 mg by mouth daily.     imatinib (GLEEVEC) 100 MG tablet TAKE 4 TABLETS (400 MG TOTAL) BY MOUTH DAILY. TAKE WITH MEALS AND LARGE GLASS OF WATER 120 tablet 1   insulin glargine (LANTUS) 100 UNIT/ML injection Inject 32 Units into the skin in the morning.     latanoprost (XALATAN) 0.005 % ophthalmic solution Place 1 drop into both eyes at bedtime.      loperamide (IMODIUM) 2 MG  capsule Take by mouth as needed for diarrhea or loose stools.     lovastatin (MEVACOR) 20 MG tablet Take 20 mg by mouth every evening.      metFORMIN (GLUCOPHAGE) 1000 MG tablet Take 1,000 mg by mouth daily.      Multiple Vitamin (MULTIVITAMIN) tablet Take 1 tablet by mouth daily.     omeprazole (PRILOSEC) 40 MG capsule Take 40 mg by mouth daily.     OPSUMIT 10 MG tablet TAKE 1 TABLET DAILY. 30 tablet 11   potassium chloride SA (KLOR-CON) 20 MEQ tablet Take 1 tablet (20 mEq total) by mouth daily. 90 tablet 3   tadalafil, PAH, (ADCIRCA) 20 MG tablet TAKE 2 TABLETS DAILY 60 tablet 11   timolol (TIMOPTIC) 0.5 % ophthalmic solution Place 1 drop into both eyes 2 (two) times daily.      Tiotropium Bromide-Olodaterol (STIOLTO RESPIMAT) 2.5-2.5 MCG/ACT AERS INHALE 2 PUFFS BY MOUTH ONCE DAILY 4 g 3   No current facility-administered medications for this encounter.   BP 120/60   Pulse 72   Wt 95.8 kg (211 lb 3.2 oz)   SpO2 94% Comment: 4l n/c  BMI 38.01 kg/m  General: NAD Neck: No JVD, no thyromegaly or thyroid nodule.  Lungs: Clear to auscultation bilaterally with normal respiratory effort. CV: Nondisplaced PMI.  Heart regular S1/S2, no S3/S4, no murmur.  No peripheral edema.  No carotid bruit.  Normal pedal pulses.  Abdomen: Soft, nontender, no hepatosplenomegaly, no distention.  Skin: Intact without lesions or rashes.  Neurologic: Alert and oriented x 3.  Psych: Normal affect. Extremities: No clubbing or cyanosis.  HEENT: Normal.   Assessment/Plan: 1. RV failure: Echo in 1/21  showed significant RV dysfunction with severe pulmonary hypertension and D-shaped interventricular septum.  Based on RHC, it looks like RV failure is due to pulmonary arterial hypertension rather than LV failure (normal PCWP).  Repeated echo in 5/22 showed EF 60-65%, low normal RV function, mild RV enlargement, PASP 44 mmHg. Echo in 8/23 showed EF 55-60%, RV mildly enlarged with normal systolic function, moderate LAE, normal IVC, PASP 35 mmHg. She is not volume overloaded on exam, NYHA class II.  - Continue current Lasix.  Check BMET/BNP today.   - Continue Farxiga.  2. Pulmonary HTN: Echo in 1/21 with RV failure and estimate PA systolic pressure 80 mmHg.  RHC in 3/21 showed pulmonary arterial hypertension.  Serologic workup was negative.  V/Q scan showed no chronic PE.  HRCT showed no ILD, mild-moderate emphysema.  PFTs suggestive of moderate COPD. Pulmonary hypertension seems out of proportion to parenchymal disease, suspect significant group 1 component to her PH in addition to group 3 (COPD).  She has been symptomatically improved with selective pulmonary vasodilators.  She was unable to tolerate Tyvaso. 6 minute walk improved, >400 m today.  - Continue to use Bipap at night, oxygen during the day.  - Continue Opsumit 10 mg daily.  - Continue tadalafil 40 mg daily.  - Check BNP today.   - Will repeat echo in 8/24.    3. H/o DVT: 2018.  She has IVC filter and is not anticoagulated due to h/o anemia.  4. Type 2 diabetes: She is now on Farxiga.  5. Chronic hypoxemic respiratory failure: Home oxygen.  Moderate COPD on PFTs, mild-moderate emphysema on HRCT.  Suspect pulmonary arterial hypertension also plays a role.  - Continue home oxygen.  Followup in 4 months in [place] office.    Marca Ancona 08/12/2022

## 2022-08-12 NOTE — Patient Instructions (Addendum)
Lab Work:  Labs done today, your results will be available in MyChart, we will contact you for abnormal readings.   6 Minute walk was done today    Your physician has requested that you have an echocardiogram. Echocardiography is a painless test that uses sound waves to create images of your heart. It provides your doctor with information about the size and shape of your heart and how well your heart's chambers and valves are working. This procedure takes approximately one hour. There are no restrictions for this procedure. Please do NOT wear cologne, perfume, aftershave, or lotions (deodorant is allowed). Please arrive 15 minutes prior to your appointment time.   THE [place] OFFICE WILL CALL YOU TO SCHEDULE YOUR ECHO.   Follow-Up in:   Your physician recommends that you schedule a follow-up appointment in: 4 months (September) in the [facility] with Dr. Shirlee Latch. ** please call the office in July to schedule follow up appointment*  [phone]   Do the following things EVERYDAY: Weigh yourself in the morning before breakfast. Write it down and keep it in a log. Take your medicines as prescribed Eat low salt foods--Limit salt (sodium) to 2000 mg per day.  Stay as active as you can everyday Limit all fluids for the day to less than 2 liters    Need to Contact us:  If you have any questions or concerns before your next appointment please send [name] a message through [place] or call our office at [phone].    TO LEAVE A MESSAGE FOR THE NURSE SELECT OPTION 2, PLEASE LEAVE A MESSAGE INCLUDING: YOUR NAME DATE OF BIRTH CALL BACK NUMBER REASON FOR CALL**this is important as we prioritize the call backs  YOU WILL RECEIVE A CALL BACK THE SAME DAY AS LONG AS YOU CALL BEFORE 4:00 PM   At the [facility], you and your health needs are our priority. As part of our continuing mission to provide you with exceptional heart care, we have created designated  Provider Care Teams. These Care Teams include your primary Cardiologist (physician) and Advanced Practice Providers (APPs- Physician Assistants and Nurse Practitioners) who all work together to provide you with the care you need, when you need it.   You may see any of the following providers on your designated Care Team at your next follow up: Dr Arvilla Meres Dr Marca Ancona Dr. Marcos Eke, NP Robbie Lis, Xoe [facility] [place], Dewanda Brynda Peon, NP Karle Plumber, PharmD   Please be sure to bring in all your medications bottles to every appointment.    Thank you for choosing [facility]

## 2022-08-14 ENCOUNTER — Other Ambulatory Visit (HOSPITAL_COMMUNITY): Payer: Self-pay

## 2022-08-15 ENCOUNTER — Other Ambulatory Visit (HOSPITAL_COMMUNITY): Payer: Self-pay

## 2022-09-01 ENCOUNTER — Ambulatory Visit: Payer: 59

## 2022-09-01 ENCOUNTER — Other Ambulatory Visit: Payer: 59

## 2022-09-01 ENCOUNTER — Ambulatory Visit: Payer: 59 | Admitting: Internal Medicine

## 2022-09-04 MED FILL — Iron Sucrose Inj 20 MG/ML (Fe Equiv): INTRAVENOUS | Qty: 10 | Status: AC

## 2022-09-05 ENCOUNTER — Encounter: Payer: Self-pay | Admitting: Internal Medicine

## 2022-09-05 ENCOUNTER — Inpatient Hospital Stay: Payer: 59

## 2022-09-05 ENCOUNTER — Inpatient Hospital Stay (HOSPITAL_BASED_OUTPATIENT_CLINIC_OR_DEPARTMENT_OTHER): Payer: 59 | Admitting: Internal Medicine

## 2022-09-05 ENCOUNTER — Inpatient Hospital Stay: Payer: 59 | Attending: Internal Medicine

## 2022-09-05 VITALS — BP 148/50 | HR 93 | Temp 98.9°F | Ht 62.5 in | Wt 207.6 lb

## 2022-09-05 DIAGNOSIS — I110 Hypertensive heart disease with heart failure: Secondary | ICD-10-CM | POA: Insufficient documentation

## 2022-09-05 DIAGNOSIS — C49A3 Gastrointestinal stromal tumor of small intestine: Secondary | ICD-10-CM | POA: Insufficient documentation

## 2022-09-05 DIAGNOSIS — Z86718 Personal history of other venous thrombosis and embolism: Secondary | ICD-10-CM | POA: Diagnosis not present

## 2022-09-05 DIAGNOSIS — I509 Heart failure, unspecified: Secondary | ICD-10-CM | POA: Insufficient documentation

## 2022-09-05 DIAGNOSIS — M791 Myalgia, unspecified site: Secondary | ICD-10-CM | POA: Diagnosis not present

## 2022-09-05 DIAGNOSIS — Z87891 Personal history of nicotine dependence: Secondary | ICD-10-CM | POA: Diagnosis not present

## 2022-09-05 DIAGNOSIS — R197 Diarrhea, unspecified: Secondary | ICD-10-CM | POA: Diagnosis not present

## 2022-09-05 DIAGNOSIS — M7989 Other specified soft tissue disorders: Secondary | ICD-10-CM | POA: Insufficient documentation

## 2022-09-05 DIAGNOSIS — Z8042 Family history of malignant neoplasm of prostate: Secondary | ICD-10-CM | POA: Insufficient documentation

## 2022-09-05 DIAGNOSIS — D508 Other iron deficiency anemias: Secondary | ICD-10-CM | POA: Insufficient documentation

## 2022-09-05 LAB — CBC WITH DIFFERENTIAL (CANCER CENTER ONLY)
Abs Immature Granulocytes: 0.01 10*3/uL (ref 0.00–0.07)
Basophils Absolute: 0 10*3/uL (ref 0.0–0.1)
Basophils Relative: 0 %
Eosinophils Absolute: 0 10*3/uL (ref 0.0–0.5)
Eosinophils Relative: 0 %
HCT: 36.3 % (ref 36.0–46.0)
Hemoglobin: 11.8 g/dL — ABNORMAL LOW (ref 12.0–15.0)
Immature Granulocytes: 0 %
Lymphocytes Relative: 20 %
Lymphs Abs: 1.1 10*3/uL (ref 0.7–4.0)
MCH: 30.8 pg (ref 26.0–34.0)
MCHC: 32.5 g/dL (ref 30.0–36.0)
MCV: 94.8 fL (ref 80.0–100.0)
Monocytes Absolute: 0.5 10*3/uL (ref 0.1–1.0)
Monocytes Relative: 9 %
Neutro Abs: 4.1 10*3/uL (ref 1.7–7.7)
Neutrophils Relative %: 71 %
Platelet Count: 224 10*3/uL (ref 150–400)
RBC: 3.83 MIL/uL — ABNORMAL LOW (ref 3.87–5.11)
RDW: 13.9 % (ref 11.5–15.5)
WBC Count: 5.8 10*3/uL (ref 4.0–10.5)
nRBC: 0 % (ref 0.0–0.2)

## 2022-09-05 LAB — CMP (CANCER CENTER ONLY)
ALT: 14 U/L (ref 0–44)
AST: 24 U/L (ref 15–41)
Albumin: 3.9 g/dL (ref 3.5–5.0)
Alkaline Phosphatase: 43 U/L (ref 38–126)
Anion gap: 10 (ref 5–15)
BUN: 24 mg/dL — ABNORMAL HIGH (ref 8–23)
CO2: 24 mmol/L (ref 22–32)
Calcium: 9 mg/dL (ref 8.9–10.3)
Chloride: 101 mmol/L (ref 98–111)
Creatinine: 1.17 mg/dL — ABNORMAL HIGH (ref 0.44–1.00)
GFR, Estimated: 48 mL/min — ABNORMAL LOW (ref >60)
Glucose, Bld: 129 mg/dL — ABNORMAL HIGH (ref 70–99)
Potassium: 4.1 mmol/L (ref 3.5–5.1)
Sodium: 135 mmol/L (ref 135–145)
Total Bilirubin: 0.2 mg/dL — ABNORMAL LOW (ref 0.3–1.2)
Total Protein: 7.2 g/dL (ref 6.5–8.1)

## 2022-09-05 LAB — FERRITIN: Ferritin: 28 ng/mL (ref 11–307)

## 2022-09-05 LAB — IRON AND TIBC
Iron: 68 ug/dL (ref 28–170)
Saturation Ratios: 20 % (ref 10.4–31.8)
TIBC: 342 ug/dL (ref 250–450)
UIBC: 274 ug/dL

## 2022-09-05 NOTE — Progress Notes (Signed)
[facility] CONSULT NOTE  Patient Care Team: Jaclyn Shaggy, MD as PCP - General (Unknown Physician Specialty) Iran Ouch, MD as PCP - Cardiology (Cardiology) Kieth Brightly, MD (General Surgery) Jaclyn Shaggy, MD (Internal Medicine) Gilda Crease, Latina Craver, MD (Vascular Surgery) Charlott Rakes, MD as Consulting Physician (Gastroenterology) Earna Coder, MD as Consulting Physician (Hematology and Oncology)  CHIEF COMPLAINTS/PURPOSE OF CONSULTATION: GIST   HEMATOLOGY HISTORY  Oncology History Overview Note  NOV 2021- CT Bx- TUMOR  Tumor Site: Peritoneum, jejunal  Histologic Type: Gastrointestinal stromal tumor, spindle cell type  Mitotic Rate: 1 per 5 mm squared  Histologic Grade: G1, low grade (mitotic rate less than or equal to 5  per 5 mm squared)- STAGE T2N1 [possible mesneteric LN]; CT A/P-9.8 x9.8 cm  # NOV 29th 2021- GLEEVEC 400 mg/day neoadjuvant therapy 6 months.   SURGICAL PATHOLOGY  CASE: [phone]  PATIENT: Susan Barajas  Surgical Pathology Report      Specimen Submitted:  A. Lymph node, mesenteric  B. Lymph node, Proximal mesenteric  C. Small bowel with GIST   Clinical History: GIST small bowel       DIAGNOSIS:  A. LYMPH NODE, MESENTERIC; EXCISIONAL BIOPSY:  - REACTIVE LYMPHOID FOLLICULAR HYPERPLASIA WITH PROGRESSIVE  TRANSFORMATION OF GERMINAL CENTERS.  - NEGATIVE FOR MALIGNANCY, ONE LYMPH NODE (0/1).   B. LYMPH NODE, PROXIMAL MESENTERIC; EXCISIONAL BIOPSY:  - REACTIVE LYMPHOID FOLLICULAR HYPERPLASIA.  - NEGATIVE FOR MALIGNANCY, TWO LYMPH NODES (0/2).   C. SMALL BOWEL WITH GIST; RESECTION:  - GASTROINTESTINAL STROMAL TUMOR.  - SEE CANCER SUMMARY BELOW.   CASE SUMMARY: (GASTROINTESTINAL STROMAL TUMOR (GIST): Resection)  Standard(s): AJCC-UICC 8   CLINICAL  Pre-resection Treatment: Previous biopsy, systemic therapy performed  (Gleevec)   SPECIMEN  Procedure: Resection (partial small bowel)    TUMOR  Tumor Focality:  Unifocal  Tumor Site: Jejunum  Tumor Size: 9.7 x 9.2 x 6.3 cm  Histologic Type: Gastrointestinal stromal tumor, spindle cell type  Mitotic Rate:  Specify mitotic rate per 5 mm squared: Less than 5 per 5  mm squared  Histologic Grade: G1, low grade  Necrosis: Not identified  Treatment Effect: Present, approximately 30% viable tumor remains  Risk Assessment: Moderate (24%) risk of progressive disease   MARGINS:  Margin status:  Margin(s) involved by GIST: Deep, see comment.   Comment:  Tumor is present on ink on the periphery of the tumor nodule.  The  nature of the specimen precludes accurate assessment of where the tumor  may have been attached in situ (apart from the small bowel attachment).  Proximal and distal bowel margins cannot be reliably assessed, given  that they were received detached from the tumor specimen.   REGIONAL LYMPH NODES  Regional lymph node status:  Regional lymph nodes present, all regional lymph nodes negative for  tumor.  Number of lymph nodes examined: Three    # CHRONIC ANEMIA EGD/capsule-WNL-2018 [Dr.vanga]-UGIB ; colonoscopy-2018 [Elliot] ; capsule-not done; hemoglobin around 8.8; iron saturation 5% [Dr.Tate].  Poor tolerance of p.o. 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   GIST (gastrointestinal stromal tumor) of small bowel, malignant (HCC)  03/12/2020 Initial Diagnosis   GIST (gastrointestinal stromal tumor) of small bowel, malignant (HCC)     HISTORY OF PRESENTING ILLNESS: Alone; ambulating independently. 6 L of oxygen nasal cannula.    Susan Barajas 77 y.o.  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  Fatigue/weakness: yes; Daily loose stools is well controlled with PRN Imodium.  Patient denies any worsening swelling of the legs.  She continues to wear compression stockings.  Intermittently on diuretics. Denies any nausea vomiting from the Gleevec.  Review of Systems  Constitutional:  Positive for malaise/fatigue. Negative for chills, diaphoresis, fever and weight loss.  HENT:  Negative for nosebleeds and sore throat.   Eyes:  Negative for double vision.  Respiratory:  Positive for shortness of breath. Negative for cough, hemoptysis, sputum production and wheezing.   Cardiovascular:  Negative for chest pain, palpitations, orthopnea and leg swelling.  Gastrointestinal:  Negative for abdominal pain, blood in stool, constipation, diarrhea, heartburn, melena, nausea and vomiting.  Genitourinary:  Negative for dysuria, frequency and urgency.  Musculoskeletal:  Negative for back pain and joint pain.  Skin: Negative.  Negative for itching and rash.  Neurological:  Negative for dizziness, tingling, focal weakness, weakness and headaches.  Endo/Heme/Allergies:  Does not bruise/bleed easily.  Psychiatric/Behavioral:  Negative for depression. The patient is not nervous/anxious and does not have insomnia.     MEDICAL HISTORY:  Past Medical History:  Diagnosis Date   Anemia    Asthma 1950   CHF (congestive heart failure) (HCC)    COPD (chronic obstructive pulmonary disease) (HCC)    Cor pulmonale (HCC)    DVT (deep venous thrombosis) (HCC) 2018   GERD (gastroesophageal reflux disease)    GIST (gastrointestinal stromal tumor) of small bowel, malignant (HCC)    Glaucoma 1949   Hyperlipidemia    Hypertension    Obesity, unspecified    OSA treated with BiPAP    Personal history of tobacco use, presenting hazards to health    Pneumonia    Presence of IVC filter     Pulmonary HTN (HCC)    Supplemental oxygen dependent    T2DM (type 2 diabetes mellitus) (HCC)    Varicose veins of lower extremities with other complications     SURGICAL HISTORY: Past Surgical History:  Procedure Laterality Date   BREAST BIOPSY Right 1991   BREAST BIOPSY Left 2013   COLONOSCOPY  2010   Dr. Mechele Collin   COLONOSCOPY  Jan 2016   Dr Mechele Collin   ESOPHAGOGASTRODUODENOSCOPY N/A 11/26/2016   Procedure: ESOPHAGOGASTRODUODENOSCOPY (EGD);  Surgeon: Toney Reil, MD;  Location[facility] ENDOSCOPY;  Service: Gastroenterology;  Laterality: N/A;   GIVENS CAPSULE STUDY N/A 11/26/2016   Procedure: GIVENS CAPSULE STUDY, if EGD negative, plan to drop capsule with scope if EGD negative;  Surgeon: Toney Reil, MD;  Location: [facility] ENDOSCOPY;  Service: Gastroenterology;  Laterality: N/A;   IVC FILTER INSERTION N/A 11/14/2016   Procedure: IVC Filter Insertion;  Surgeon: Renford Dills, MD;  Location: [facility] INVASIVE CV LAB;  Service: Cardiovascular;  Laterality: N/A;   PARTIAL COLECTOMY N/A 11/05/2020   Procedure: SMALL BOWEL RESECTION, REMOVAL OF GIST TUMOR AND LYMPH NODES, UMBILICAL HERNIA REPAIR;  Surgeon: Earline Mayotte, MD;  Location: [facility] ORS;  Service: General;  Laterality: N/A;  small bowel resection, RNFA to assist; TAPP block per anesthesia   RIGHT/LEFT  HEART CATH AND CORONARY ANGIOGRAPHY N/A 06/30/2019   Procedure: RIGHT/LEFT HEART CATH AND CORONARY ANGIOGRAPHY;  Surgeon: Laurey Morale, MD;  Location: [facility] INVASIVE CV LAB;  Service: Cardiovascular;  Laterality: N/A;   VEIN SURGERY Right 2006   Vein Closure Procedure; RF ablation of right GSV    SOCIAL HISTORY: Social History   Socioeconomic History   Marital status: Single    Spouse name: Not on file   Number of children: 2   Years of education: Not on file   Highest education level: Not on file  Occupational History   Occupation: CELL    Employer: RETIRED  Tobacco Use   Smoking status: Former    Packs/day:  1.50    Years: 20.00    Additional pack years: 0.00    Total pack years: 30.00    Types: Cigarettes    Quit date: 1999    Years since quitting: 25.4   Smokeless tobacco: Never  Vaping Use   Vaping Use: Never used  Substance and Sexual Activity   Alcohol use: No    Alcohol/week: 0.0 standard drinks of alcohol   Drug use: No   Sexual activity: Not Currently  Other Topics Concern   Not on file  Social History Narrative   Independent at baseline. Lives at home with her brother; in [place]. Quit smoking 20 years; no alcohol.    Social Determinants of Health   Financial Resource Strain: Not on file  Food Insecurity: Not on file  Transportation Needs: Not on file  Physical Activity: Not on file  Stress: Not on file  Social Connections: Not on file  Intimate Partner Violence: Not on file    FAMILY HISTORY: Family History  Problem Relation Age of Onset   Prostate cancer Father    Other Sister        mouth cancer   Hypertension Mother     ALLERGIES:  has No Known Allergies.  MEDICATIONS:  Current Outpatient Medications  Medication Sig Dispense Refill   ACCU-CHEK AVIVA PLUS test strip 1 each 2 (two) times daily.     Accu-Chek Softclix Lancets lancets USE 1 TO CHECK GLUCOSE TWICE DAILY     acetaminophen (TYLENOL) 325 MG tablet Take 2 tablets (650 mg total) by mouth every 6 (six) hours as needed for mild pain (or Fever >/= 101).     aspirin 81 MG chewable tablet Chew 1 tablet (81 mg total) by mouth daily. 30 tablet 0   clobetasol cream (TEMOVATE) 0.05 % Apply 1 Application topically 3 (three) times a week.     dorzolamide-timolol (COSOPT) 2-0.5 % ophthalmic solution 1 drop 2 (two) times daily.     FARXIGA 10 MG TABS tablet Take 1 tablet by mouth once daily 90 tablet 3   ferrous gluconate (FERGON) 325 MG tablet Take 325 mg by mouth in the morning and at bedtime.      furosemide (LASIX) 40 MG tablet Take 40 mg by mouth daily.     imatinib (GLEEVEC) 100 MG tablet TAKE 4  TABLETS (400 MG TOTAL) BY MOUTH DAILY. TAKE WITH MEALS AND LARGE GLASS OF WATER 120 tablet 1   insulin glargine (LANTUS) 100 UNIT/ML injection Inject 32 Units into the skin in the morning.     latanoprost (XALATAN) 0.005 % ophthalmic solution Place 1 drop into both eyes at bedtime.      loperamide (IMODIUM) 2 MG capsule Take by mouth as needed for diarrhea or loose stools.     lovastatin (MEVACOR) 20  MG tablet Take 20 mg by mouth every evening.      metFORMIN (GLUCOPHAGE) 1000 MG tablet Take 1,000 mg by mouth daily.      Multiple Vitamin (MULTIVITAMIN) tablet Take 1 tablet by mouth daily.     omeprazole (PRILOSEC) 40 MG capsule Take 40 mg by mouth daily.     OPSUMIT 10 MG tablet TAKE 1 TABLET DAILY. 30 tablet 11   potassium chloride SA (KLOR-CON) 20 MEQ tablet Take 1 tablet (20 mEq total) by mouth daily. 90 tablet 3   tadalafil, PAH, (ADCIRCA) 20 MG tablet TAKE 2 TABLETS DAILY 60 tablet 11   timolol (TIMOPTIC) 0.5 % ophthalmic solution Place 1 drop into both eyes 2 (two) times daily.      Tiotropium Bromide-Olodaterol (STIOLTO RESPIMAT) 2.5-2.5 MCG/ACT AERS INHALE 2 PUFFS BY MOUTH ONCE DAILY 4 g 3   No current facility-administered medications for this visit.      PHYSICAL EXAMINATION:   Vitals:   09/05/22 1412  BP: (!) 148/50  Pulse: 93  Temp: 98.9 F (37.2 C)  SpO2: (!) 86%   Filed Weights   09/05/22 1412  Weight: 207 lb 9.6 oz (94.2 kg)    Physical Exam HENT:     Head: Normocephalic and atraumatic.     Mouth/Throat:     Pharynx: No oropharyngeal exudate.  Eyes:     Pupils: Pupils are equal, round, and reactive to light.  Cardiovascular:     Rate and Rhythm: Normal rate and regular rhythm.  Pulmonary:     Effort: No respiratory distress.     Breath sounds: No wheezing.     Comments: Decreased breath sounds. Abdominal:     General: Bowel sounds are normal. There is no distension.     Palpations: Abdomen is soft. There is no mass.     Tenderness: There is no  abdominal tenderness. There is no guarding or rebound.  Musculoskeletal:        General: No tenderness. Normal range of motion.     Cervical back: Normal range of motion and neck supple.  Skin:    General: Skin is warm.  Neurological:     Mental Status: She is alert and oriented to person, place, and time.  Psychiatric:        Mood and Affect: Affect normal.     LABORATORY DATA:  I have reviewed the data as listed Lab Results  Component Value Date   WBC 5.8 09/05/2022   HGB 11.8 (L) 09/05/2022   HCT 36.3 09/05/2022   MCV 94.8 09/05/2022   PLT 224 09/05/2022   Recent Labs    02/24/22 1242 03/26/22 0947 06/03/22 1311 08/12/22 0908 09/05/22 1413  NA 137   < > 139 139 135  K 4.0   < > 3.7 4.4 4.1  CL 103   < > 105 104 101  CO2 26   < > 25 26 24   GLUCOSE 171*   < > 113* 91 129*  BUN 20   < > 30* 23 24*  CREATININE 0.99   < > 1.12* 1.18* 1.17*  CALCIUM 8.8*   < > 8.9 10.1 9.0  GFRNONAA 59*   < > 51* 48* 48*  PROT 7.3  --  7.3  --  7.2  ALBUMIN 3.9  --  3.9  --  3.9  AST 21  --  21  --  24  ALT 16  --  16  --  14  ALKPHOS 49  --  49  --  43  BILITOT 0.5  --  0.5  --  0.2*   < > = values in this interval not displayed.     No results found.   GIST (gastrointestinal stromal tumor) of small bowel, malignant (HCC) # NOV 2021-GIST of small bowel-low grade STAGE II [T3N0]-s/p neoadjuvant therapy with Gleevec x6 months [November-end of June 2022]-s/p resection ~30% viable tumor noted post resection specimen.  Currently on adjuvant Gleevec [3-5 years].  No evidence of recurrence.  Stable- will order CT imaging today.   # Tolerating Gleevec well except for mild side effects [diarrhea]; swelling in the feet [see below]  # Swelling in the feet-grade 1 Continue compression stockings/diuretics as needed. stable  #CHF [Dr.McClean, [facility]-[place]]/COPD home O2 4-6Lits;-borderline respiratory status-stable  #Iron deficient anemia-  sec to small bowel GIST-currently s/p resection.   Today hemoglobin is   11.8. Discussed with pt- FEB 2024-- I sat-18%;Ferritin-32. Continue PO iron BID for now. HOLD venofer today  # Myalgias/arthralgias question Gleevec versus others- recommend ca+vit D; tylenol as needed- Stable.   # DISPOSITION:.   # HOLD venofer today # Follow up in 3  month-  MD; labs-cbc/cmp;iron studies/ferritin; possible venofer; CT scan prior- Dr.B      All questions were answered. The patient knows to call the clinic with any problems, questions or concerns.    Earna Coder, MD 09/05/2022 2:51 PM

## 2022-09-05 NOTE — Assessment & Plan Note (Signed)
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  Currently on adjuvant Gleevec [3-5 years].  No evidence of recurrence.  Stable- will order CT imaging today.   # Tolerating Gleevec well except for mild side effects [diarrhea]; swelling in the feet [see below]  # Swelling in the feet-grade 1 Continue compression stockings/diuretics as needed. stable  #CHF [Dr.McClean, [facility]-[place]]/COPD home O2 4-6Lits;-borderline respiratory status-stable  #Iron deficient anemia-  sec to small bowel GIST-currently s/p resection.  Today hemoglobin is   11.8. Discussed with pt- FEB 2024-- I sat-18%;Ferritin-32. Continue PO iron BID for now. HOLD venofer today  # Myalgias/arthralgias question Gleevec versus others- recommend ca+vit D; tylenol as needed- Stable.   # DISPOSITION:.   # HOLD venofer today # Follow up in 3  month-  MD; labs-cbc/cmp;iron studies/ferritin; possible venofer; CT scan prior- Dr.B

## 2022-09-05 NOTE — Progress Notes (Signed)
Fatigue/weakness: yes Dyspena: yes Light headedness: no  Blood in stool: no 

## 2022-09-10 ENCOUNTER — Other Ambulatory Visit: Payer: Self-pay | Admitting: Pharmacist

## 2022-09-10 ENCOUNTER — Other Ambulatory Visit (HOSPITAL_COMMUNITY): Payer: Self-pay

## 2022-09-10 ENCOUNTER — Other Ambulatory Visit: Payer: Self-pay

## 2022-09-10 DIAGNOSIS — C49A3 Gastrointestinal stromal tumor of small intestine: Secondary | ICD-10-CM

## 2022-09-10 MED ORDER — IMATINIB MESYLATE 100 MG PO TABS
ORAL_TABLET | ORAL | 2 refills | Status: DC
Start: 2022-09-10 — End: 2022-12-08
  Filled 2022-09-10: qty 120, 30d supply, fill #0
  Filled 2022-10-08: qty 120, 30d supply, fill #1
  Filled 2022-11-07: qty 120, 30d supply, fill #2

## 2022-09-11 ENCOUNTER — Other Ambulatory Visit (HOSPITAL_COMMUNITY): Payer: Self-pay

## 2022-10-02 ENCOUNTER — Telehealth: Payer: Self-pay

## 2022-10-02 NOTE — Telephone Encounter (Signed)
Unable to contact patient, left message for patient to call clinic to scheduled follow-up appointment.

## 2022-10-07 ENCOUNTER — Other Ambulatory Visit (HOSPITAL_COMMUNITY): Payer: Self-pay

## 2022-10-08 ENCOUNTER — Other Ambulatory Visit (HOSPITAL_COMMUNITY): Payer: Self-pay

## 2022-10-10 ENCOUNTER — Other Ambulatory Visit (HOSPITAL_COMMUNITY): Payer: Self-pay

## 2022-10-22 ENCOUNTER — Other Ambulatory Visit (HOSPITAL_COMMUNITY): Payer: Self-pay | Admitting: Internal Medicine

## 2022-10-22 DIAGNOSIS — I50810 Right heart failure, unspecified: Secondary | ICD-10-CM

## 2022-11-07 ENCOUNTER — Other Ambulatory Visit (HOSPITAL_COMMUNITY): Payer: Self-pay

## 2022-11-10 ENCOUNTER — Other Ambulatory Visit: Payer: Self-pay

## 2022-11-11 ENCOUNTER — Ambulatory Visit: Payer: 59 | Attending: Cardiology

## 2022-11-11 DIAGNOSIS — I272 Pulmonary hypertension, unspecified: Secondary | ICD-10-CM | POA: Diagnosis not present

## 2022-11-11 LAB — ECHOCARDIOGRAM COMPLETE
AR max vel: 3.03 cm2
AV Area VTI: 3.1 cm2
AV Area mean vel: 2.92 cm2
AV Mean grad: 7 mmHg
AV Peak grad: 12.7 mmHg
Ao pk vel: 1.78 m/s
Area-P 1/2: 3.77 cm2
Calc EF: 58 %
S' Lateral: 3.3 cm
Single Plane A2C EF: 54.4 %
Single Plane A4C EF: 63.1 %

## 2022-11-24 ENCOUNTER — Ambulatory Visit
Admission: RE | Admit: 2022-11-24 | Discharge: 2022-11-24 | Disposition: A | Discharge status: Home / Self Care | Payer: 59 | Source: Ambulatory Visit | Attending: Internal Medicine | Admitting: Internal Medicine

## 2022-11-24 DIAGNOSIS — C49A3 Gastrointestinal stromal tumor of small intestine: Secondary | ICD-10-CM | POA: Insufficient documentation

## 2022-11-26 ENCOUNTER — Telehealth: Payer: Self-pay | Admitting: Internal Medicine

## 2022-11-26 NOTE — Telephone Encounter (Signed)
Susan Barajas would like office notes faxed to [employer].  Susan Barajas phone number is [phone]. Fax number is [phone].

## 2022-11-26 NOTE — Telephone Encounter (Signed)
[facility] patient- received recording that call could not be completed.   Last OV note faxed to [facility] per request.

## 2022-11-27 NOTE — Telephone Encounter (Signed)
Lm for [employer].  Will close encounter, as OV notes have been faxed.

## 2022-12-02 ENCOUNTER — Ambulatory Visit: Payer: 59 | Attending: Cardiology | Admitting: Cardiology

## 2022-12-02 ENCOUNTER — Other Ambulatory Visit (HOSPITAL_COMMUNITY): Payer: Self-pay

## 2022-12-02 ENCOUNTER — Other Ambulatory Visit: Payer: Self-pay

## 2022-12-02 VITALS — BP 130/53 | HR 77 | Ht 65.0 in | Wt 217.0 lb

## 2022-12-02 DIAGNOSIS — I50810 Right heart failure, unspecified: Secondary | ICD-10-CM | POA: Diagnosis not present

## 2022-12-02 DIAGNOSIS — J9611 Chronic respiratory failure with hypoxia: Secondary | ICD-10-CM

## 2022-12-02 NOTE — Progress Notes (Signed)
PCP: Jaclyn Shaggy, MD Cardiology: Dr. Kirke Corin HF Cardiology: Dr. Shirlee Latch  77 y.o. with history of chronic hypoxemic respiratory failure, OSA, and chronic diastolic CHF was referred by Dr. Kirke Corin for evaluation of CHF and pulmonary hypertension.  Patient has been on home oxygen for 2-3 years per her report.  She was a smoker remotely. She had a DVT in 2018.  Anticoagulation was stopped due to refractory anemia and IVC filter was placed.  CT chest did not show a PE. She has OSA and uses CPAP.  Last echo in 1/21 showed normal LV EF with PASP 80 and severely dilated RV with mild RV dysfunction and D-shaped septum. She has been on Lasix for diuresis due to RV failure.    RHC/LHC was done in 3/21, showing mild CAD; mean RA 12, PA 68/25 mean 42, mean PCWP 9, CI 4.1, PVR 4.13 WU.  V/Q scan in 3/21 did not show evidence for chronic PE.  High resolution chest CT in 3/21 did not show evidence for ILD, it did show mild-moderate emphysema. PFTs (3/21) showed moderate-severe COPD.   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   She was unable to tolerate Tyvaso due to intractable nausea and vomiting.   Echo 5/22 showed EF 60-65%, low normal RV function, mild RV enlargement, PASP 44 mmHg.  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   Echo in 8/23 showed EF 55-60%, RV mildly enlarged with normal systolic function, moderate LAE,  normal IVC, PASP 35 mmHg.   Echo in 7/24 showed EF 60-65%, normal RV, PASP 48 mmHg, mild MR.   She returns for followup of pulmonary hypertension and RV failure.  Wearing home oxygen, 4L at rest and 6L with exertion, uses Bipap at night. Weight is up about 6 lbs.  Symptomatically stable, notes dyspnea with moderate activity.  No problems walking as long as she has her oxygen on.  No chest pain. No lightheadedness.  No palpitations.  Follows with pulmonary.   6 minute walk (3/21): 182 m 6 minute walk (5/21): 243 m, oxygen saturation dropped to as low as  70s 6 minute walk (7/21): Only walked 3 min bc oxygen saturation dropped to 70s and CMA stopped walk, she went 121 m.  6 minute walk (3/22): 298 m 6 minute walk (3/23): 366 m with her home oxygen.  6 minute walk (4/24): 427 m with home oxygen  Labs (12/20): BNP 819 Labs (2/21): K 4.4, creatinine 0.72 Labs (3/21): RF normal, ANA negative, anti-Jo-1 negative, anti-SCL-70 negative Labs (5/21): K 4.3, creatinine 0.88 Labs (6/21): hgb 10 Labs (3/22): BNP 89 Labs (4/22): K 3.9, creatinine 0.93 Labs (8/22): K 3.5, creatinine 0.72 Labs (10/22): K 4.8, creatinine 0.77, hgb 11.1 Labs (1/23): K 4.2, creatinine 0.86 Labs (4/23): K 4.2, creatinine 0.93, hgb 12 Labs (2/24): K 3.7, creatinine 1.12 Labs (4/24): BNP 98, LDL 63 Labs (5/24): K 4.1, creatinine 1.17  PMH: 1. COVID-19 PNA 8/20.  2. Type 2 diabetes 3. DVT: right leg, 2018.  - Has IVC filter, not anticoagulated due to h/o anemia.  4. HTN 5. Hyperlipidemia.  6. OSA: Uses Bipap.  7. COPD: Chronic hypoxemic respiratory failure, 3 L home oxygen.  8. Hyponatremia on HCTZ 9. Chronic diastolic CHF:  - Lexiscan Cardiolite in 2/19 was low risk no ischemia.  - Echo (1/21): EF 65-70%, no LVH, trivial MR, PASP 80 mmHg, severe RV enlargement with mildly decreased RV systolic function, D-shaped interventricular septum, dilated IVC.  -  CTA chest (8/18): No evidence for PE.  10. Pulmonary hypertension: RHC/LHC 3/21 with mild CAD; mean RA 12, PA 68/25 mean 42, mean PCWP 9, CI 4.1, PVR 4.13 WU.  - Serologic workup negative.  - V/Q scan (3/21): No chronic PE.  - HRCT (3/21): No ILD, mild-moderate COPD. - PFTs (3/21): moderate-severe COPD - Echo (5/22): EF 60-65%, low normal RV function, mild RV enlargement, PASP 44 mmHg - Echo (8/23): EF 55-60%, RV mildly enlarged with normal systolic function, moderate LAE,  normal IVC, PASP 35 mmHg.  - Echo (7/24): EF 60-65%, normal RV, PASP 48 mmHg, mild MR.  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  - Small  bowel GIST resection 7/22  Social History   Socioeconomic History   Marital status: Single    Spouse name: Not on file   Number of children: 2   Years of education: Not on file   Highest education level: Not on file  Occupational History   Occupation: CELL    Employer: RETIRED  Tobacco Use   Smoking status: Former    Current packs/day: 0.00    Average packs/day: 1.5 packs/day for 20.0 years (30.0 ttl pk-yrs)    Types: Cigarettes    Start date: 67    Quit date: 1999    Years since quitting: 25.6   Smokeless tobacco: Never  Vaping Use   Vaping status: Never Used  Substance and Sexual Activity   Alcohol use: No    Alcohol/week: 0.0 standard drinks of alcohol   Drug use: No   Sexual activity: Not Currently  Other Topics Concern   Not on file  Social History Narrative   Independent at baseline. Lives at home with her brother; in . Quit smoking 20 years; no alcohol.    Social Determinants of Health   Financial Resource Strain: Not on file  Food Insecurity: Not on file  Transportation Needs: Not on file  Physical Activity: Not on file  Stress: Not on file  Social Connections: Not on file  Intimate Partner Violence: Not on file   Family History  Problem Relation Age of Onset   Prostate cancer Father    Other Sister        mouth cancer   Hypertension Mother    ROS: All systems reviewed and negative except as per HPI.   Current Outpatient Medications  Medication Sig Dispense Refill   ACCU-CHEK AVIVA PLUS test strip 1 each 2 (two) times daily.     Accu-Chek Softclix Lancets lancets USE 1 TO CHECK GLUCOSE TWICE DAILY     acetaminophen (TYLENOL) 325 MG tablet Take 2 tablets (650 mg total) by mouth every 6 (six) hours as needed for mild pain (or Fever >/= 101).     aspirin 81 MG chewable tablet Chew 1 tablet (81 mg total) by mouth daily. 30 tablet 0   clobetasol cream (TEMOVATE) 0.05 % Apply 1 Application topically 3 (three) times a week.      dorzolamide-timolol (COSOPT) 2-0.5 % ophthalmic solution 1 drop 2 (two) times daily.     FARXIGA 10 MG TABS tablet Take 1 tablet by mouth once daily 90 tablet 3   ferrous gluconate (FERGON) 325 MG tablet Take 325 mg by mouth in the morning and at bedtime.      furosemide (LASIX) 40 MG tablet Take 40 mg by mouth daily.     imatinib (GLEEVEC) 100 MG tablet TAKE 4 TABLETS (400 MG TOTAL) BY MOUTH DAILY. TAKE WITH MEALS AND LARGE GLASS OF  WATER 120 tablet 2   insulin glargine (LANTUS) 100 UNIT/ML injection Inject 32 Units into the skin in the morning.     latanoprost (XALATAN) 0.005 % ophthalmic solution Place 1 drop into both eyes at bedtime.      loperamide (IMODIUM) 2 MG capsule Take by mouth as needed for diarrhea or loose stools.     lovastatin (MEVACOR) 20 MG tablet Take 20 mg by mouth every evening.      metFORMIN (GLUCOPHAGE) 1000 MG tablet Take 1,000 mg by mouth daily.      Multiple Vitamin (MULTIVITAMIN) tablet Take 1 tablet by mouth daily.     omeprazole (PRILOSEC) 40 MG capsule Take 40 mg by mouth daily.     OPSUMIT 10 MG tablet TAKE 1 TABLET DAILY. 30 tablet 11   potassium chloride SA (KLOR-CON) 20 MEQ tablet Take 1 tablet (20 mEq total) by mouth daily. 90 tablet 3   tadalafil, PAH, (ADCIRCA) 20 MG tablet TAKE 2 TABLETS DAILY 60 tablet 11   timolol (TIMOPTIC) 0.5 % ophthalmic solution Place 1 drop into both eyes 2 (two) times daily.      Tiotropium Bromide-Olodaterol (STIOLTO RESPIMAT) 2.5-2.5 MCG/ACT AERS INHALE 2 PUFFS BY MOUTH ONCE DAILY 4 g 3   No current facility-administered medications for this visit.   BP (!) 130/53   Pulse 77   Ht 5\' 5"  (1.651 m)   Wt 217 lb (98.4 kg)   SpO2 95% Comment: 4L [place]  BMI 36.11 kg/m  General: NAD Neck: JVP 7-8 cm, no thyromegaly or thyroid nodule.  Lungs: Distant breath sounds.  CV: Nondisplaced PMI.  Heart regular S1/S2, no S3/S4, no murmur.  Trace ankle edema.  No carotid bruit.  Normal pedal pulses.  Abdomen: Soft, nontender, no  hepatosplenomegaly, no distention.  Skin: Intact without lesions or rashes.  Neurologic: Alert and oriented x 3.  Psych: Normal affect. Extremities: No clubbing or cyanosis.  HEENT: Normal.   Assessment/Plan: 1. RV failure: Echo in 1/21 showed significant RV dysfunction with severe pulmonary hypertension and D-shaped interventricular septum.  Based on RHC, it looks like RV failure is due to pulmonary arterial hypertension rather than LV failure (normal PCWP).  Repeated echo in 5/22 showed EF 60-65%, low normal RV function, mild RV enlargement, PASP 44 mmHg. Echo in 8/23 showed EF 55-60%, RV mildly enlarged with normal systolic function, moderate LAE, normal IVC, PASP 35 mmHg.  Echo in 7/24 showed EF 60-65%, normal RV, PASP 48 mmHg, mild MR. NYHA class II, not volume overloaded on exam.  - Continue current Lasix.  Check BMET/BNP today.   - Continue Farxiga.  2. Pulmonary HTN: Echo in 1/21 with RV failure and estimate PA systolic pressure 80 mmHg.  RHC in 3/21 showed pulmonary arterial hypertension.  Serologic workup was negative.  V/Q scan showed no chronic PE.  HRCT showed no ILD, mild-moderate emphysema.  PFTs suggestive of moderate COPD. Pulmonary hypertension seems out of proportion to parenchymal disease, possible significant group 1 component to her PH in addition to group 3 (COPD).  I reviewed echo from 7/24, RV looked normal with mild pulmonary hypertension, PASP 48 mmHg. She has been symptomatically improved with selective pulmonary vasodilators.  She was unable to tolerate Tyvaso.  - 6 minute walk last visit, will repeat next visit.  - Continue to use Bipap at night, oxygen during the day.  - Continue Opsumit 10 mg daily.  - Continue tadalafil 40 mg daily.  - Check BNP today.   - Will not add  additional PH meds at this time with mixed picture, probably predominantly group 3 PH.  3. H/o DVT: 2018.  She has IVC filter and is not anticoagulated due to h/o anemia.  4. Type 2 diabetes: She is  on Farxiga.  5. Chronic hypoxemic respiratory failure: Home oxygen.  Moderate COPD on PFTs, mild-moderate emphysema on HRCT.  Suspect pulmonary arterial hypertension also plays a role.  - Continue home oxygen.  Followup in 3 months.     Marca Ancona 12/02/2022

## 2022-12-02 NOTE — Patient Instructions (Signed)
Lab Work:  Labs done today, your results will be available in MyChart, we will contact you for abnormal readings.    Special Instructions // Education:  Do the following things EVERYDAY: Weigh yourself in the morning before breakfast. Write it down and keep it in a log. Take your medicines as prescribed Eat low salt foods--Limit salt (sodium) to 2000 mg per day.  Stay as active as you can everyday Limit all fluids for the day to less than 2 liters   Follow-Up in: follow up in 3 months.    If you have any questions or concerns before your next appointment please send [name] a message through [place] or call our office at [phone] Monday-Friday 8 am-5 pm.   If you have an urgent need after hours on the weekend please call your Primary Cardiologist or the [facility] in [place] at [phone].

## 2022-12-03 LAB — BASIC METABOLIC PANEL
BUN/Creatinine Ratio: 24 (ref 12–28)
BUN: 26 mg/dL (ref 8–27)
CO2: 24 mmol/L (ref 20–29)
Calcium: 8.9 mg/dL (ref 8.7–10.3)
Chloride: 104 mmol/L (ref 96–106)
Creatinine, Ser: 1.07 mg/dL — ABNORMAL HIGH (ref 0.57–1.00)
Glucose: 73 mg/dL (ref 70–99)
Potassium: 4.4 mmol/L (ref 3.5–5.2)
Sodium: 141 mmol/L (ref 134–144)
eGFR: 53 mL/min/{1.73_m2} — ABNORMAL LOW (ref >59)

## 2022-12-03 LAB — BRAIN NATRIURETIC PEPTIDE: BNP: 84.8 pg/mL (ref 0.0–100.0)

## 2022-12-05 ENCOUNTER — Other Ambulatory Visit (HOSPITAL_COMMUNITY): Payer: Self-pay

## 2022-12-08 ENCOUNTER — Other Ambulatory Visit (HOSPITAL_COMMUNITY): Payer: Self-pay

## 2022-12-08 ENCOUNTER — Other Ambulatory Visit: Payer: Self-pay

## 2022-12-08 ENCOUNTER — Other Ambulatory Visit: Payer: Self-pay | Admitting: Internal Medicine

## 2022-12-08 DIAGNOSIS — C49A3 Gastrointestinal stromal tumor of small intestine: Secondary | ICD-10-CM

## 2022-12-08 MED ORDER — IMATINIB MESYLATE 100 MG PO TABS
ORAL_TABLET | ORAL | 2 refills | Status: DC
Start: 2022-12-08 — End: 2023-02-25
  Filled 2022-12-08: qty 120, 30d supply, fill #0
  Filled 2022-12-30: qty 120, 30d supply, fill #1
  Filled 2023-01-27: qty 120, 30d supply, fill #2

## 2022-12-09 ENCOUNTER — Other Ambulatory Visit (HOSPITAL_COMMUNITY): Payer: Self-pay

## 2022-12-10 ENCOUNTER — Inpatient Hospital Stay: Payer: 59 | Attending: Internal Medicine

## 2022-12-10 ENCOUNTER — Inpatient Hospital Stay: Payer: 59

## 2022-12-10 ENCOUNTER — Inpatient Hospital Stay (HOSPITAL_BASED_OUTPATIENT_CLINIC_OR_DEPARTMENT_OTHER): Payer: 59 | Admitting: Internal Medicine

## 2022-12-10 VITALS — BP 115/52 | HR 80 | Temp 99.8°F | Wt 212.0 lb

## 2022-12-10 DIAGNOSIS — Z808 Family history of malignant neoplasm of other organs or systems: Secondary | ICD-10-CM | POA: Insufficient documentation

## 2022-12-10 DIAGNOSIS — Z9981 Dependence on supplemental oxygen: Secondary | ICD-10-CM | POA: Diagnosis not present

## 2022-12-10 DIAGNOSIS — C49A3 Gastrointestinal stromal tumor of small intestine: Secondary | ICD-10-CM

## 2022-12-10 DIAGNOSIS — M7989 Other specified soft tissue disorders: Secondary | ICD-10-CM | POA: Insufficient documentation

## 2022-12-10 DIAGNOSIS — J449 Chronic obstructive pulmonary disease, unspecified: Secondary | ICD-10-CM | POA: Insufficient documentation

## 2022-12-10 DIAGNOSIS — D509 Iron deficiency anemia, unspecified: Secondary | ICD-10-CM | POA: Insufficient documentation

## 2022-12-10 DIAGNOSIS — I509 Heart failure, unspecified: Secondary | ICD-10-CM | POA: Diagnosis not present

## 2022-12-10 DIAGNOSIS — Z8042 Family history of malignant neoplasm of prostate: Secondary | ICD-10-CM | POA: Insufficient documentation

## 2022-12-10 DIAGNOSIS — K5730 Diverticulosis of large intestine without perforation or abscess without bleeding: Secondary | ICD-10-CM | POA: Diagnosis not present

## 2022-12-10 DIAGNOSIS — Z87891 Personal history of nicotine dependence: Secondary | ICD-10-CM | POA: Insufficient documentation

## 2022-12-10 DIAGNOSIS — N183 Chronic kidney disease, stage 3 unspecified: Secondary | ICD-10-CM | POA: Insufficient documentation

## 2022-12-10 LAB — CMP (CANCER CENTER ONLY)
ALT: 15 U/L (ref 0–44)
AST: 20 U/L (ref 15–41)
Albumin: 3.9 g/dL (ref 3.5–5.0)
Alkaline Phosphatase: 46 U/L (ref 38–126)
Anion gap: 8 (ref 5–15)
BUN: 23 mg/dL (ref 8–23)
CO2: 24 mmol/L (ref 22–32)
Calcium: 8.9 mg/dL (ref 8.9–10.3)
Chloride: 103 mmol/L (ref 98–111)
Creatinine: 1.31 mg/dL — ABNORMAL HIGH (ref 0.44–1.00)
GFR, Estimated: 42 mL/min — ABNORMAL LOW (ref >60)
Glucose, Bld: 130 mg/dL — ABNORMAL HIGH (ref 70–99)
Potassium: 4.2 mmol/L (ref 3.5–5.1)
Sodium: 135 mmol/L (ref 135–145)
Total Bilirubin: 0.3 mg/dL (ref 0.3–1.2)
Total Protein: 7.2 g/dL (ref 6.5–8.1)

## 2022-12-10 LAB — IRON AND TIBC
Iron: 70 ug/dL (ref 28–170)
Saturation Ratios: 21 % (ref 10.4–31.8)
TIBC: 330 ug/dL (ref 250–450)
UIBC: 260 ug/dL

## 2022-12-10 LAB — CBC WITH DIFFERENTIAL (CANCER CENTER ONLY)
Abs Immature Granulocytes: 0.01 10*3/uL (ref 0.00–0.07)
Basophils Absolute: 0 10*3/uL (ref 0.0–0.1)
Basophils Relative: 0 %
Eosinophils Absolute: 0 10*3/uL (ref 0.0–0.5)
Eosinophils Relative: 0 %
HCT: 33.9 % — ABNORMAL LOW (ref 36.0–46.0)
Hemoglobin: 11.2 g/dL — ABNORMAL LOW (ref 12.0–15.0)
Immature Granulocytes: 0 %
Lymphocytes Relative: 17 %
Lymphs Abs: 1 10*3/uL (ref 0.7–4.0)
MCH: 31.1 pg (ref 26.0–34.0)
MCHC: 33 g/dL (ref 30.0–36.0)
MCV: 94.2 fL (ref 80.0–100.0)
Monocytes Absolute: 0.5 10*3/uL (ref 0.1–1.0)
Monocytes Relative: 9 %
Neutro Abs: 4.3 10*3/uL (ref 1.7–7.7)
Neutrophils Relative %: 74 %
Platelet Count: 226 10*3/uL (ref 150–400)
RBC: 3.6 MIL/uL — ABNORMAL LOW (ref 3.87–5.11)
RDW: 13.6 % (ref 11.5–15.5)
WBC Count: 5.9 10*3/uL (ref 4.0–10.5)
nRBC: 0 % (ref 0.0–0.2)

## 2022-12-10 LAB — FERRITIN: Ferritin: 20 ng/mL (ref 11–307)

## 2022-12-10 NOTE — Progress Notes (Signed)
Patient says that she has no new questions or concerns today. She did have a CT abdomin scan on 11/24/2022

## 2022-12-10 NOTE — Progress Notes (Signed)
[facility] CONSULT NOTE  Patient Care Team: Jaclyn Shaggy, MD as PCP - General (Unknown Physician Specialty) Iran Ouch, MD as PCP - Cardiology (Cardiology) Kieth Brightly, MD (General Surgery) Jaclyn Shaggy, MD (Internal Medicine) Gilda Crease, Latina Craver, MD (Vascular Surgery) Charlott Rakes, MD as Consulting Physician (Gastroenterology) Earna Coder, MD as Consulting Physician (Hematology and Oncology)  CHIEF COMPLAINTS/PURPOSE OF CONSULTATION: GIST   HEMATOLOGY HISTORY  Oncology History Overview Note  NOV 2021- CT Bx- TUMOR  Tumor Site: Peritoneum, jejunal  Histologic Type: Gastrointestinal stromal tumor, spindle cell type  Mitotic Rate: 1 per 5 mm squared  Histologic Grade: G1, low grade (mitotic rate less than or equal to 5  per 5 mm squared)- STAGE T2N1 [possible mesneteric LN]; CT A/P-9.8 x9.8 cm  # NOV 29th 2021- GLEEVEC 400 mg/day neoadjuvant therapy 6 months.   SURGICAL PATHOLOGY  CASE: [phone]  PATIENT: Susan Barajas  Surgical Pathology Report      Specimen Submitted:  A. Lymph node, mesenteric  B. Lymph node, Proximal mesenteric  C. Small bowel with GIST   Clinical History: GIST small bowel       DIAGNOSIS:  A. LYMPH NODE, MESENTERIC; EXCISIONAL BIOPSY:  - REACTIVE LYMPHOID FOLLICULAR HYPERPLASIA WITH PROGRESSIVE  TRANSFORMATION OF GERMINAL CENTERS.  - NEGATIVE FOR MALIGNANCY, ONE LYMPH NODE (0/1).   B. LYMPH NODE, PROXIMAL MESENTERIC; EXCISIONAL BIOPSY:  - REACTIVE LYMPHOID FOLLICULAR HYPERPLASIA.  - NEGATIVE FOR MALIGNANCY, TWO LYMPH NODES (0/2).   C. SMALL BOWEL WITH GIST; RESECTION:  - GASTROINTESTINAL STROMAL TUMOR.  - SEE CANCER SUMMARY BELOW.   CASE SUMMARY: (GASTROINTESTINAL STROMAL TUMOR (GIST): Resection)  Standard(s): AJCC-UICC 8   CLINICAL  Pre-resection Treatment: Previous biopsy, systemic therapy performed  (Gleevec)   SPECIMEN  Procedure: Resection (partial small bowel)    TUMOR  Tumor Focality:  Unifocal  Tumor Site: Jejunum  Tumor Size: 9.7 x 9.2 x 6.3 cm  Histologic Type: Gastrointestinal stromal tumor, spindle cell type  Mitotic Rate:  Specify mitotic rate per 5 mm squared: Less than 5 per 5  mm squared  Histologic Grade: G1, low grade  Necrosis: Not identified  Treatment Effect: Present, approximately 30% viable tumor remains  Risk Assessment: Moderate (24%) risk of progressive disease   MARGINS:  Margin status:  Margin(s) involved by GIST: Deep, see comment.   Comment:  Tumor is present on ink on the periphery of the tumor nodule.  The  nature of the specimen precludes accurate assessment of where the tumor  may have been attached in situ (apart from the small bowel attachment).  Proximal and distal bowel margins cannot be reliably assessed, given  that they were received detached from the tumor specimen.   REGIONAL LYMPH NODES  Regional lymph node status:  Regional lymph nodes present, all regional lymph nodes negative for  tumor.  Number of lymph nodes examined: Three    # CHRONIC ANEMIA EGD/capsule-WNL-2018 [Dr.vanga]-UGIB ; colonoscopy-2018 [Elliot] ; capsule-not done; hemoglobin around 8.8; iron saturation 5% [Dr.Tate].  Poor tolerance of p.o. 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   GIST (gastrointestinal stromal tumor) of small bowel, malignant (HCC)  03/12/2020 Initial Diagnosis   GIST (gastrointestinal stromal tumor) of small bowel, malignant (HCC)     HISTORY OF PRESENTING ILLNESS: Alone; ambulating independently. 6 L of oxygen nasal cannula.    Susan Barajas 77 y.o.  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   Patient says that she has no new questions or concerns today. Daily loose stools is well controlled with PRN Imodium.  Patient denies any worsening swelling of the legs.  She continues to wear compression stockings.  Intermittently on diuretics. Denies any nausea vomiting from the Gleevec.  Review of Systems  Constitutional:  Positive for malaise/fatigue. Negative for chills, diaphoresis, fever and weight loss.  HENT:  Negative for nosebleeds and sore throat.   Eyes:  Negative for double vision.  Respiratory:  Positive for shortness of breath. Negative for cough, hemoptysis, sputum production and wheezing.   Cardiovascular:  Negative for chest pain, palpitations, orthopnea and leg swelling.  Gastrointestinal:  Negative for abdominal pain, blood in stool, constipation, diarrhea, heartburn, melena, nausea and vomiting.  Genitourinary:  Negative for dysuria, frequency and urgency.  Musculoskeletal:  Negative for back pain and joint pain.  Skin: Negative.  Negative for itching and rash.  Neurological:  Negative for dizziness, tingling, focal weakness, weakness and headaches.  Endo/Heme/Allergies:  Does not bruise/bleed easily.  Psychiatric/Behavioral:  Negative for depression. The patient is not nervous/anxious and does not have insomnia.     MEDICAL HISTORY:  Past Medical History:  Diagnosis Date   Anemia    Asthma 1950   CHF (congestive heart failure) (HCC)    COPD (chronic obstructive pulmonary disease) (HCC)    Cor pulmonale (HCC)    DVT (deep venous thrombosis) (HCC) 2018   GERD (gastroesophageal reflux disease)    GIST (gastrointestinal stromal tumor) of small bowel, malignant (HCC)    Glaucoma 1949   Hyperlipidemia    Hypertension    Obesity, unspecified    OSA treated with BiPAP    Personal history of tobacco use,  presenting hazards to health    Pneumonia    Presence of IVC filter    Pulmonary HTN (HCC)    Supplemental oxygen dependent    T2DM (type 2 diabetes mellitus) (HCC)    Varicose veins of lower extremities with other complications     SURGICAL HISTORY: Past Surgical History:  Procedure Laterality Date   BREAST BIOPSY Right 1991   BREAST BIOPSY Left 2013   COLONOSCOPY  2010   Dr. Mechele Collin   COLONOSCOPY  Jan 2016   Dr Mechele Collin   ESOPHAGOGASTRODUODENOSCOPY N/A 11/26/2016   Procedure: ESOPHAGOGASTRODUODENOSCOPY (EGD);  Surgeon: Toney Reil, MD;  Location: [facility] ENDOSCOPY;  Service: Gastroenterology;  Laterality: N/A;   GIVENS CAPSULE STUDY N/A 11/26/2016   Procedure: GIVENS CAPSULE STUDY, if EGD negative, plan to drop capsule with scope if EGD negative;  Surgeon: Toney Reil, MD;  Location: [facility] ENDOSCOPY;  Service: Gastroenterology;  Laterality: N/A;   IVC FILTER INSERTION N/A 11/14/2016   Procedure: IVC Filter Insertion;  Surgeon: Renford Dills, MD;  Location: [facility] INVASIVE CV LAB;  Service: Cardiovascular;  Laterality: N/A;   PARTIAL COLECTOMY N/A 11/05/2020   Procedure: SMALL BOWEL RESECTION, REMOVAL OF GIST TUMOR AND LYMPH NODES, UMBILICAL HERNIA REPAIR;  Surgeon: Earline Mayotte, MD;  Location: [facility] ORS;  Service: General;  Laterality:  N/A;  small bowel resection, RNFA to assist; TAPP block per anesthesia   RIGHT/LEFT HEART CATH AND CORONARY ANGIOGRAPHY N/A 06/30/2019   Procedure: RIGHT/LEFT HEART CATH AND CORONARY ANGIOGRAPHY;  Surgeon: Laurey Morale, MD;  Location: [facility] INVASIVE CV LAB;  Service: Cardiovascular;  Laterality: N/A;   VEIN SURGERY Right 2006   Vein Closure Procedure; RF ablation of right GSV    SOCIAL HISTORY: Social History   Socioeconomic History   Marital status: Single    Spouse name: Not on file   Number of children: 2   Years of education: Not on file   Highest education level: Not on file  Occupational History   Occupation: CELL     Employer: RETIRED  Tobacco Use   Smoking status: Former    Current packs/day: 0.00    Average packs/day: 1.5 packs/day for 20.0 years (30.0 ttl pk-yrs)    Types: Cigarettes    Start date: 57    Quit date: 1999    Years since quitting: 25.6   Smokeless tobacco: Never  Vaping Use   Vaping status: Never Used  Substance and Sexual Activity   Alcohol use: No    Alcohol/week: 0.0 standard drinks of alcohol   Drug use: No   Sexual activity: Not Currently  Other Topics Concern   Not on file  Social History Narrative   Independent at baseline. Lives at home with her brother; in [place]. Quit smoking 20 years; no alcohol.    Social Determinants of Health   Financial Resource Strain: Not on file  Food Insecurity: Not on file  Transportation Needs: Not on file  Physical Activity: Not on file  Stress: Not on file  Social Connections: Not on file  Intimate Partner Violence: Not on file    FAMILY HISTORY: Family History  Problem Relation Age of Onset   Prostate cancer Father    Other Sister        mouth cancer   Hypertension Mother     ALLERGIES:  has No Known Allergies.  MEDICATIONS:  Current Outpatient Medications  Medication Sig Dispense Refill   ACCU-CHEK AVIVA PLUS test strip 1 each 2 (two) times daily.     Accu-Chek Softclix Lancets lancets USE 1 TO CHECK GLUCOSE TWICE DAILY     acetaminophen (TYLENOL) 325 MG tablet Take 2 tablets (650 mg total) by mouth every 6 (six) hours as needed for mild pain (or Fever >/= 101).     aspirin 81 MG chewable tablet Chew 1 tablet (81 mg total) by mouth daily. 30 tablet 0   clobetasol cream (TEMOVATE) 0.05 % Apply 1 Application topically 3 (three) times a week.     dorzolamide-timolol (COSOPT) 2-0.5 % ophthalmic solution 1 drop 2 (two) times daily.     FARXIGA 10 MG TABS tablet Take 1 tablet by mouth once daily 90 tablet 3   ferrous gluconate (FERGON) 325 MG tablet Take 325 mg by mouth in the morning and at bedtime.      furosemide  (LASIX) 40 MG tablet Take 40 mg by mouth daily.     imatinib (GLEEVEC) 100 MG tablet TAKE 4 TABLETS (400 MG TOTAL) BY MOUTH DAILY. TAKE WITH MEALS AND LARGE GLASS OF WATER 120 tablet 2   insulin glargine (LANTUS) 100 UNIT/ML injection Inject 32 Units into the skin in the morning.     latanoprost (XALATAN) 0.005 % ophthalmic solution Place 1 drop into both eyes at bedtime.      loperamide (IMODIUM) 2 MG capsule Take  by mouth as needed for diarrhea or loose stools.     lovastatin (MEVACOR) 20 MG tablet Take 20 mg by mouth every evening.      metFORMIN (GLUCOPHAGE) 1000 MG tablet Take 1,000 mg by mouth daily.      Multiple Vitamin (MULTIVITAMIN) tablet Take 1 tablet by mouth daily.     omeprazole (PRILOSEC) 40 MG capsule Take 40 mg by mouth daily.     OPSUMIT 10 MG tablet TAKE 1 TABLET DAILY. 30 tablet 11   potassium chloride SA (KLOR-CON) 20 MEQ tablet Take 1 tablet (20 mEq total) by mouth daily. 90 tablet 3   tadalafil, PAH, (ADCIRCA) 20 MG tablet TAKE 2 TABLETS DAILY 60 tablet 11   timolol (TIMOPTIC) 0.5 % ophthalmic solution Place 1 drop into both eyes 2 (two) times daily.      Tiotropium Bromide-Olodaterol (STIOLTO RESPIMAT) 2.5-2.5 MCG/ACT AERS INHALE 2 PUFFS BY MOUTH ONCE DAILY 4 g 3   No current facility-administered medications for this visit.      PHYSICAL EXAMINATION:   Vitals:   12/10/22 1316  BP: (!) 115/52  Pulse: 80  Temp: 99.8 F (37.7 C)  SpO2: (!) 86%   Filed Weights   12/10/22 1316  Weight: 212 lb (96.2 kg)    Physical Exam HENT:     Head: Normocephalic and atraumatic.     Mouth/Throat:     Pharynx: No oropharyngeal exudate.  Eyes:     Pupils: Pupils are equal, round, and reactive to light.  Cardiovascular:     Rate and Rhythm: Normal rate and regular rhythm.  Pulmonary:     Effort: No respiratory distress.     Breath sounds: No wheezing.     Comments: Decreased breath sounds. Abdominal:     General: Bowel sounds are normal. There is no distension.      Palpations: Abdomen is soft. There is no mass.     Tenderness: There is no abdominal tenderness. There is no guarding or rebound.  Musculoskeletal:        General: No tenderness. Normal range of motion.     Cervical back: Normal range of motion and neck supple.  Skin:    General: Skin is warm.  Neurological:     Mental Status: She is alert and oriented to person, place, and time.  Psychiatric:        Mood and Affect: Affect normal.     LABORATORY DATA:  I have reviewed the data as listed Lab Results  Component Value Date   WBC 5.9 12/10/2022   HGB 11.2 (L) 12/10/2022   HCT 33.9 (L) 12/10/2022   MCV 94.2 12/10/2022   PLT 226 12/10/2022   Recent Labs    06/03/22 1311 08/12/22 0908 09/05/22 1413 12/02/22 1044 12/10/22 1302  NA 139 139 135 141 135  K 3.7 4.4 4.1 4.4 4.2  CL 105 104 101 104 103  CO2 25 26 24 24 24   GLUCOSE 113* 91 129* 73 130*  BUN 30* 23 24* 26 23  CREATININE 1.12* 1.18* 1.17* 1.07* 1.31*  CALCIUM 8.9 10.1 9.0 8.9 8.9  GFRNONAA 51* 48* 48*  --  42*  PROT 7.3  --  7.2  --  7.2  ALBUMIN 3.9  --  3.9  --  3.9  AST 21  --  24  --  20  ALT 16  --  14  --  15  ALKPHOS 49  --  43  --  46  BILITOT 0.5  --  0.2*  --  0.3     CT ABDOMEN PELVIS WO CONTRAST  Result Date: 11/28/2022 CLINICAL DATA:  GI stromal tumor of small bowel. Staging. Status post partial colectomy and small-bowel resection with removal of GI stromal tumor and lymph nodes on 11/05/2020. * Tracking Code: BO * EXAM: CT ABDOMEN AND PELVIS WITHOUT CONTRAST TECHNIQUE: Multidetector CT imaging of the abdomen and pelvis was performed following the standard protocol without IV contrast. RADIATION DOSE REDUCTION: This exam was performed according to the departmental dose-optimization program which includes automated exposure control, adjustment of the mA and/or kV according to patient size and/or use of iterative reconstruction technique. COMPARISON:  10/01/2020 PET FINDINGS: Lower chest: Left base  subsegmental atelectasis. Mild cardiomegaly with right coronary artery calcification. Tiny hiatal hernia. Hepatobiliary: Normal noncontrast appearance of the liver. Normal gallbladder, without biliary ductal dilatation. Pancreas: Normal, without mass or ductal dilatation. Spleen: Normal in size, without focal abnormality. Adrenals/Urinary Tract: Normal adrenal glands. Punctate left renal collecting system calculus versus less likely renal vascular calcification. No right renal calculi or hydronephrosis. No bladder calculi. Stomach/Bowel: Proximal gastric fold thickening including on 17/2. Apparent gastric antral wall thickening on 22/2 may be due to underdistention. Extensive colonic diverticulosis. Again identified is wall thickening involving the sigmoid with surrounding subtle interstitial thickening including on 47/2. Similar. Colonic stool burden suggests constipation. Normal terminal ileum and appendix. Normal small bowel caliber. No residual or recurrent small bowel mass. Vascular/Lymphatic: Aortic atherosclerosis. IVC filter appropriately positioned. Small abdominal retroperitoneal nodes are unchanged and likely reactive. The left upper quadrant/small bowel mesenteric dominant mass has presumably been resected. Upper pelvic small bowel mesenteric nodes measure up to 1.4 cm on 49/2 versus 1.7 cm on the prior PET when remeasured in a similar fashion. No pelvic sidewall adenopathy. Reproductive: Uterine fibroids including a calcified fundal lesion of 6.6 cm. No adnexal mass. Other: No significant free fluid. No evidence of omental or peritoneal disease. Mild pelvic wall laxity contains nonobstructive small bowel, similar. Musculoskeletal: No acute osseous abnormality. Degenerative disc disease at the lumbosacral junction. IMPRESSION: 1. Presumed interval resection of dominant left abdominal mass. 2. No typical findings of abdominopelvic recurrent or metastatic disease. 3. Small bowel mesenteric adenopathy is  minimally improved from the prior PET, favored to be reactive. 4. Proximal gastric fold thickening suggests gastritis. Given the extent of fold thickening, consider endoscopy. 5.  Possible constipation. 6. Similar extensive colonic diverticulosis with chronic sigmoid wall thickening and pericolonic interstitial thickening. Most likely all related to chronic diverticulitis. Superimposed acute inflammation cannot be excluded. 7. Fibroid uterus 8. Coronary artery atherosclerosis. Aortic Atherosclerosis (ICD10-I70.0). 9.  Tiny hiatal hernia. Electronically Signed   By: Jeronimo Greaves M.D.   On: 11/28/2022 08:39   ECHOCARDIOGRAM COMPLETE  Result Date: 11/11/2022    ECHOCARDIOGRAM REPORT   Patient Name:   Susan ANNA Holford Date of Exam: 11/11/2022 Medical Rec #:  [id number]            Height:       62.5 in Accession #:    [id number]           Weight:       207.6 lb Date of Birth:  [birth year]             BSA:          1.954 m Patient Age:    77 years             BP:           142/64 mmHg  Patient Gender: F                    HR:           78 bpm. Exam Location:  [facility] Procedure: 2D Echo, Cardiac Doppler and Color Doppler Indications:    R06.02 SOB; I27.20 Pulmonary Hypertension  History:        Patient has prior history of Echocardiogram examinations, most                 recent 11/19/2021. Dilated thoracic aorta, Pulmonary HTN and COPD,                 Signs/Symptoms:Shortness of Breath; Risk Factors:Sleep Apnea,                 Former Smoker and Diabetes.  Sonographer:    Quentin Ore RDMS, RVT, RDCS Referring Phys: [id number] [facility] IMPRESSIONS  1. Left ventricular ejection fraction, by estimation, is 60 to 65%. The left ventricle has normal function. The left ventricle has no regional wall motion abnormalities. There is mild left ventricular hypertrophy. Left ventricular diastolic parameters are consistent with Grade I diastolic dysfunction (impaired relaxation).  2. Right ventricular systolic function is normal.  The right ventricular size is normal. There is moderately elevated pulmonary artery systolic pressure. The estimated right ventricular systolic pressure is 48.3 mmHg.  3. Left atrial size was moderately dilated.  4. The mitral valve is normal in structure. Mild mitral valve regurgitation. No evidence of mitral stenosis.  5. The aortic valve has an indeterminant number of cusps. Aortic valve regurgitation is not visualized. No aortic stenosis is present.  6. The inferior vena cava is normal in size with greater than 50% respiratory variability, suggesting right atrial pressure of 3 mmHg. FINDINGS  Left Ventricle: Left ventricular ejection fraction, by estimation, is 60 to 65%. The left ventricle has normal function. The left ventricle has no regional wall motion abnormalities. The left ventricular internal cavity size was normal in size. There is  mild left ventricular hypertrophy. Left ventricular diastolic parameters are consistent with Grade I diastolic dysfunction (impaired relaxation). Right Ventricle: The right ventricular size is normal. No increase in right ventricular wall thickness. Right ventricular systolic function is normal. There is moderately elevated pulmonary artery systolic pressure. The tricuspid regurgitant velocity is 3.29 m/s, and with an assumed right atrial pressure of 5 mmHg, the estimated right ventricular systolic pressure is 48.3 mmHg. Left Atrium: Left atrial size was moderately dilated. Right Atrium: Right atrial size was normal in size. Pericardium: There is no evidence of pericardial effusion. Mitral Valve: The mitral valve is normal in structure. Mild mitral valve regurgitation. No evidence of mitral valve stenosis. Tricuspid Valve: The tricuspid valve is normal in structure. Tricuspid valve regurgitation is mild . No evidence of tricuspid stenosis. Aortic Valve: The aortic valve has an indeterminant number of cusps. Aortic valve regurgitation is not visualized. No aortic stenosis is  present. Aortic valve mean gradient measures 7.0 mmHg. Aortic valve peak gradient measures 12.7 mmHg. Aortic valve area, by VTI measures 3.10 cm. Pulmonic Valve: The pulmonic valve was normal in structure. Pulmonic valve regurgitation is not visualized. No evidence of pulmonic stenosis. Aorta: The aortic root is normal in size and structure. Venous: The inferior vena cava is normal in size with greater than 50% respiratory variability, suggesting right atrial pressure of 3 mmHg. IAS/Shunts: No atrial level shunt detected by color flow Doppler.  LEFT VENTRICLE PLAX 2D LVIDd:  5.30 cm      Diastology LVIDs:         3.30 cm      LV e' medial:    7.51 cm/s LV PW:         1.30 cm      LV E/e' medial:  10.1 LV IVS:        1.30 cm      LV e' lateral:   11.30 cm/s LVOT diam:     2.20 cm      LV E/e' lateral: 6.7 LV SV:         116 LV SV Index:   59 LVOT Area:     3.80 cm  LV Volumes (MOD) LV vol d, MOD A2C: 112.0 ml LV vol d, MOD A4C: 124.0 ml LV vol s, MOD A2C: 51.1 ml LV vol s, MOD A4C: 45.8 ml LV SV MOD A2C:     60.9 ml LV SV MOD A4C:     124.0 ml LV SV MOD BP:      69.1 ml RIGHT VENTRICLE             IVC RV Basal diam:  4.40 cm     IVC diam: 1.70 cm RV Mid diam:    4.20 cm RV S prime:     20.70 cm/s TAPSE (M-mode): 3.1 cm LEFT ATRIUM             Index        RIGHT ATRIUM           Index LA diam:        3.80 cm 1.94 cm/m   RA Area:     20.90 cm LA Vol (A2C):   99.5 ml 50.93 ml/m  RA Volume:   59.10 ml  30.25 ml/m LA Vol (A4C):   89.9 ml 46.01 ml/m LA Biplane Vol: 95.6 ml 48.93 ml/m  AORTIC VALVE                     PULMONIC VALVE AV Area (Vmax):    3.03 cm      PV Vmax:       1.24 m/s AV Area (Vmean):   2.92 cm      PV Peak grad:  6.2 mmHg AV Area (VTI):     3.10 cm AV Vmax:           178.00 cm/s AV Vmean:          124.000 cm/s AV VTI:            0.373 m AV Peak Grad:      12.7 mmHg AV Mean Grad:      7.0 mmHg LVOT Vmax:         142.00 cm/s LVOT Vmean:        95.400 cm/s LVOT VTI:          0.304 m  LVOT/AV VTI ratio: 0.82  AORTA Ao Root diam: 3.40 cm Ao Asc diam:  3.60 cm Ao Arch diam: 2.8 cm MITRAL VALVE               TRICUSPID VALVE MV Area (PHT): 3.77 cm    TR Peak grad:   43.3 mmHg MV Decel Time: 201 msec    TR Vmax:        329.00 cm/s MV E velocity: 75.50 cm/s MV A velocity: 79.10 cm/s  SHUNTS MV E/A ratio:  0.95        Systemic VTI:  0.30 m                            Systemic Diam: 2.20 cm Julien Nordmann MD Electronically signed by Julien Nordmann MD Signature Date/Time: 11/11/2022/10:04:48 PM    Final      GIST (gastrointestinal stromal tumor) of small bowel, malignant (HCC) # NOV 2021-GIST of small bowel-low grade STAGE II [T3N0]-s/p neoadjuvant therapy with Gleevec x6 months [November-end of June 2022]-s/p resection ~30% viable tumor noted post resection specimen.  Currently on adjuvant Gleevec [3-5 years].  No evidence of recurrence.  AUG 2024- CT AP-  Presumed interval resection of dominant left abdominal mass;  No typical findings of abdominopelvic recurrent or metastatic Disease; Small bowel mesenteric adenopathy is minimally improved from the prior PET, favored to be reactive.  # Tolerating Gleevec well except for mild side effects [diarrhea]; swelling in the feet [see below]  # Swelling in the feet-grade 1 Continue compression stockings/diuretics as needed. stable  #CHF [Dr.McClean, cards-[place]]/COPD home O2 4-6Lits;-borderline respiratory status-stable  # CKD stage III-[diuretics] stable.   # reflex- on PPI- stable.   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  Today hemoglobin is   11.8. Discussed with pt- FEB 2024-- I sat-18%;Ferritin-32. Continue PO iron BID for now. HOLD venofer today- stable  # Myalgias/arthralgias question Gleevec versus others- recommend ca+vit D; tylenol as needed-stable  #Incidental findings on Imaging  CT, 2024: Proximal gastric fold thickening suggests gastritis. Similar extensive colonic diverticulosis with chronic  sigmoid wall thickening and pericolonic interstitial thickening. Most likely all related to chronic diverticulitis;  Fibroid uterus ;  Coronary artery atherosclerosis. Aortic Atherosclerosis , Tiny hiatal hernia.I reviewed/discussed/counseled the patient.   # DISPOSITION:.   # HOLD venofer today # Follow up in 3  month-  MD; labs-cbc/cmp;iron studies/ferritin; possible venofer;  Dr.B  # I reviewed the blood work- with the patient in detail; also reviewed the imaging independently [as summarized above]; and with the patient in detail.      All questions were answered. The patient knows to call the clinic with any problems, questions or concerns.    Earna Coder, MD 12/10/2022 2:09 PM

## 2022-12-10 NOTE — Assessment & Plan Note (Addendum)
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  Currently on adjuvant Gleevec [3-5 years].  No evidence of recurrence.  AUG 2024- CT AP-  Presumed interval resection of dominant left abdominal mass;  No typical findings of abdominopelvic recurrent or metastatic Disease; Small bowel mesenteric adenopathy is minimally improved from the prior PET, favored to be reactive.  # Tolerating Gleevec well except for mild side effects [diarrhea]; swelling in the feet [see below]  # Swelling in the feet-grade 1 Continue compression stockings/diuretics as needed. stable  #CHF [Dr.McClean, cards-[place]]/COPD home O2 4-6Lits;-borderline respiratory status-stable  # CKD stage III-[diuretics] stable.   # reflex- on PPI- stable.   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  Today hemoglobin is   11.8. Discussed with pt- FEB 2024-- I sat-18%;Ferritin-32. Continue PO iron BID for now. HOLD venofer today- stable  # Myalgias/arthralgias question Gleevec versus others- recommend ca+vit D; tylenol as needed-stable  #Incidental findings on Imaging  CT, 2024: Proximal gastric fold thickening suggests gastritis. Similar extensive colonic diverticulosis with chronic sigmoid wall thickening and pericolonic interstitial thickening. Most likely all related to chronic diverticulitis;  Fibroid uterus ;  Coronary artery atherosclerosis. Aortic Atherosclerosis , Tiny hiatal hernia.I reviewed/discussed/counseled the patient.   # DISPOSITION:.   # HOLD venofer today # Follow up in 3  month-  MD; labs-cbc/cmp;iron studies/ferritin; possible venofer;  Dr.B  # I reviewed the blood work- with the patient in detail; also reviewed the imaging independently [as summarized above]; and with the patient in detail.

## 2022-12-24 ENCOUNTER — Other Ambulatory Visit: Payer: Self-pay | Admitting: Internal Medicine

## 2022-12-30 ENCOUNTER — Other Ambulatory Visit (HOSPITAL_COMMUNITY): Payer: Self-pay

## 2023-01-27 ENCOUNTER — Other Ambulatory Visit: Payer: Self-pay

## 2023-01-27 NOTE — Progress Notes (Signed)
Specialty Pharmacy Refill Coordination Note  Susan Barajas is a 77 y.o. female contacted today regarding refills of specialty medication(s) Imatinib Mesylate (Antineoplastic Enzyme Inhibitors)   Patient requested Delivery   Delivery date: 02/06/23   Verified address: [address]  [place] [address]   Medication will be filled on 02/05/23.

## 2023-02-25 ENCOUNTER — Other Ambulatory Visit: Payer: Self-pay

## 2023-02-25 ENCOUNTER — Other Ambulatory Visit: Payer: Self-pay | Admitting: Internal Medicine

## 2023-02-25 DIAGNOSIS — C49A3 Gastrointestinal stromal tumor of small intestine: Secondary | ICD-10-CM

## 2023-02-25 NOTE — Progress Notes (Signed)
Specialty Pharmacy Ongoing Clinical Assessment Note  Susan Barajas is a 77 y.o. female who is being followed by the specialty pharmacy service for RxSp Oncology   Patient's specialty medication(s) reviewed today: Imatinib Mesylate (Antineoplastic Enzyme Inhibitors)   Missed doses in the last 4 weeks: 0   Patient/Caregiver did not have any additional questions or concerns.   Therapeutic benefit summary: Patient is achieving benefit   Adverse events/side effects summary: No adverse events/side effects   Patient's therapy is appropriate to: Continue    Goals Addressed             This Visit's Progress    Slow Disease Progression       Patient is on track. Patient will maintain adherence         Follow up:  6 months  [name] [facility] Specialty Pharmacist

## 2023-02-25 NOTE — Progress Notes (Signed)
Specialty Pharmacy Refill Coordination Note  Susan Barajas is a 77 y.o. female contacted today regarding refills of specialty medication(s) Imatinib Mesylate (Antineoplastic Enzyme Inhibitors)   Patient requested Delivery   Delivery date: 03/05/23   Verified address: [address] [place], [place] [address]   Medication will be filled on 03/04/23.

## 2023-02-26 ENCOUNTER — Other Ambulatory Visit: Payer: Self-pay

## 2023-02-26 ENCOUNTER — Other Ambulatory Visit (HOSPITAL_COMMUNITY): Payer: Self-pay

## 2023-02-26 MED ORDER — IMATINIB MESYLATE 100 MG PO TABS
ORAL_TABLET | ORAL | 2 refills | Status: DC
Start: 2023-02-26 — End: 2023-05-20
  Filled 2023-02-26: qty 120, 30d supply, fill #0
  Filled 2023-04-01: qty 120, 30d supply, fill #1
  Filled 2023-04-28: qty 120, 30d supply, fill #2

## 2023-03-17 ENCOUNTER — Encounter: Payer: Self-pay | Admitting: Internal Medicine

## 2023-03-17 ENCOUNTER — Inpatient Hospital Stay (HOSPITAL_BASED_OUTPATIENT_CLINIC_OR_DEPARTMENT_OTHER): Payer: 59 | Admitting: Internal Medicine

## 2023-03-17 ENCOUNTER — Inpatient Hospital Stay: Payer: 59

## 2023-03-17 ENCOUNTER — Inpatient Hospital Stay: Payer: 59 | Attending: Internal Medicine

## 2023-03-17 VITALS — BP 127/48 | HR 102 | Temp 98.8°F | Ht 65.0 in | Wt 212.4 lb

## 2023-03-17 DIAGNOSIS — Z87891 Personal history of nicotine dependence: Secondary | ICD-10-CM | POA: Insufficient documentation

## 2023-03-17 DIAGNOSIS — Z8042 Family history of malignant neoplasm of prostate: Secondary | ICD-10-CM | POA: Diagnosis not present

## 2023-03-17 DIAGNOSIS — C49A3 Gastrointestinal stromal tumor of small intestine: Secondary | ICD-10-CM | POA: Insufficient documentation

## 2023-03-17 DIAGNOSIS — Z9981 Dependence on supplemental oxygen: Secondary | ICD-10-CM | POA: Insufficient documentation

## 2023-03-17 DIAGNOSIS — M7989 Other specified soft tissue disorders: Secondary | ICD-10-CM | POA: Diagnosis not present

## 2023-03-17 DIAGNOSIS — N183 Chronic kidney disease, stage 3 unspecified: Secondary | ICD-10-CM | POA: Diagnosis not present

## 2023-03-17 DIAGNOSIS — Z800 Family history of malignant neoplasm of digestive organs: Secondary | ICD-10-CM | POA: Insufficient documentation

## 2023-03-17 DIAGNOSIS — Z9049 Acquired absence of other specified parts of digestive tract: Secondary | ICD-10-CM | POA: Diagnosis not present

## 2023-03-17 DIAGNOSIS — D509 Iron deficiency anemia, unspecified: Secondary | ICD-10-CM | POA: Insufficient documentation

## 2023-03-17 LAB — IRON AND TIBC
Iron: 41 ug/dL (ref 28–170)
Saturation Ratios: 12 % (ref 10.4–31.8)
TIBC: 357 ug/dL (ref 250–450)
UIBC: 316 ug/dL

## 2023-03-17 LAB — CBC WITH DIFFERENTIAL (CANCER CENTER ONLY)
Abs Immature Granulocytes: 0.02 10*3/uL (ref 0.00–0.07)
Basophils Absolute: 0 10*3/uL (ref 0.0–0.1)
Basophils Relative: 0 %
Eosinophils Absolute: 0 10*3/uL (ref 0.0–0.5)
Eosinophils Relative: 1 %
HCT: 35.7 % — ABNORMAL LOW (ref 36.0–46.0)
Hemoglobin: 11.7 g/dL — ABNORMAL LOW (ref 12.0–15.0)
Immature Granulocytes: 0 %
Lymphocytes Relative: 14 %
Lymphs Abs: 0.9 10*3/uL (ref 0.7–4.0)
MCH: 31.6 pg (ref 26.0–34.0)
MCHC: 32.8 g/dL (ref 30.0–36.0)
MCV: 96.5 fL (ref 80.0–100.0)
Monocytes Absolute: 0.5 10*3/uL (ref 0.1–1.0)
Monocytes Relative: 8 %
Neutro Abs: 4.8 10*3/uL (ref 1.7–7.7)
Neutrophils Relative %: 77 %
Platelet Count: 229 10*3/uL (ref 150–400)
RBC: 3.7 MIL/uL — ABNORMAL LOW (ref 3.87–5.11)
RDW: 14.4 % (ref 11.5–15.5)
WBC Count: 6.2 10*3/uL (ref 4.0–10.5)
nRBC: 0 % (ref 0.0–0.2)

## 2023-03-17 LAB — CMP (CANCER CENTER ONLY)
ALT: 16 U/L (ref 0–44)
AST: 23 U/L (ref 15–41)
Albumin: 3.8 g/dL (ref 3.5–5.0)
Alkaline Phosphatase: 43 U/L (ref 38–126)
Anion gap: 11 (ref 5–15)
BUN: 22 mg/dL (ref 8–23)
CO2: 23 mmol/L (ref 22–32)
Calcium: 8.7 mg/dL — ABNORMAL LOW (ref 8.9–10.3)
Chloride: 102 mmol/L (ref 98–111)
Creatinine: 1.14 mg/dL — ABNORMAL HIGH (ref 0.44–1.00)
GFR, Estimated: 50 mL/min — ABNORMAL LOW (ref >60)
Glucose, Bld: 170 mg/dL — ABNORMAL HIGH (ref 70–99)
Potassium: 4.1 mmol/L (ref 3.5–5.1)
Sodium: 136 mmol/L (ref 135–145)
Total Bilirubin: 0.5 mg/dL (ref <1.2)
Total Protein: 7 g/dL (ref 6.5–8.1)

## 2023-03-17 LAB — FERRITIN: Ferritin: 15 ng/mL (ref 11–307)

## 2023-03-17 NOTE — Progress Notes (Signed)
O2-4L. No new/worsening sob.  Fatigue/weakness: no Dyspena: yes Light headedness: no Blood in stool: no

## 2023-03-17 NOTE — Assessment & Plan Note (Addendum)
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  Currently on adjuvant Gleevec [3-5 years].  No evidence of recurrence.    # AUG 2024- CT AP-  Presumed interval resection of dominant left abdominal mass;  No typical findings of abdominopelvic recurrent or metastatic Disease; Small bowel mesenteric adenopathy is minimally improved from the prior PET, favored to be reactive.  # At length I discussed re: stopping now [3 years] vs continuing for 5 years-.  Since patient is high risk GIST-I would recommend 5 years especially as patient is tolerating Gleevec without any major side effects.  However if she does develop any side effects we will discontinue Gleevec  # Tolerating Gleevec well except for mild side effects [diarrhea]; swelling in the feet [see below]-  # Swelling in the feet-grade 1 Continue compression stockings/diuretics as needed.  stable  #CHF [Dr.McClean, cards-[place]]/COPD home O2 4-6Lits;-borderline respiratory status- stable  # CKD stage III-[diuretics]  stable  # reflex- on PPI- stable.   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  Today hemoglobin is   11.8. Discussed with pt- FEB 2024-- I sat-18%;Ferritin-32. Continue PO iron one pill every other day-  HOLD venofer today-  stable  # Myalgias/arthralgias question Gleevec versus others- recommend ca+vit D; tylenol as needed-stable   # DISPOSITION:.   # HOLD venofer today # Follow up in 3 month-  MD; labs-cbc/cmp;iron studies/ferritin; possible venofer;CT AP prior-  Dr.B

## 2023-03-17 NOTE — Progress Notes (Signed)
[facility] CONSULT NOTE  Patient Care Team: Jaclyn Shaggy, MD as PCP - General (Unknown Physician Specialty) Iran Ouch, MD as PCP - Cardiology (Cardiology) Kieth Brightly, MD (General Surgery) Jaclyn Shaggy, MD (Internal Medicine) Gilda Crease, Latina Craver, MD (Vascular Surgery) Charlott Rakes, MD as Consulting Physician (Gastroenterology) Earna Coder, MD as Consulting Physician (Hematology and Oncology)  CHIEF COMPLAINTS/PURPOSE OF CONSULTATION: GIST   HEMATOLOGY HISTORY  Oncology History Overview Note  NOV 2021- CT Bx- TUMOR  Tumor Site: Peritoneum, jejunal  Histologic Type: Gastrointestinal stromal tumor, spindle cell type  Mitotic Rate: 1 per 5 mm squared  Histologic Grade: G1, low grade (mitotic rate less than or equal to 5  per 5 mm squared)- STAGE T2N1 [possible mesneteric LN]; CT A/P-9.8 x9.8 cm  # NOV 29th 2021- GLEEVEC 400 mg/day neoadjuvant therapy 6 months.   SURGICAL PATHOLOGY  CASE: [id number]  PATIENT: [place] Drouillard  Surgical Pathology Report      Specimen Submitted:  A. Lymph node, mesenteric  B. Lymph node, Proximal mesenteric  C. Small bowel with GIST   Clinical History: GIST small bowel       DIAGNOSIS:  A. LYMPH NODE, MESENTERIC; EXCISIONAL BIOPSY:  - REACTIVE LYMPHOID FOLLICULAR HYPERPLASIA WITH PROGRESSIVE  TRANSFORMATION OF GERMINAL CENTERS.  - NEGATIVE FOR MALIGNANCY, ONE LYMPH NODE (0/1).   B. LYMPH NODE, PROXIMAL MESENTERIC; EXCISIONAL BIOPSY:  - REACTIVE LYMPHOID FOLLICULAR HYPERPLASIA.  - NEGATIVE FOR MALIGNANCY, TWO LYMPH NODES (0/2).   C. SMALL BOWEL WITH GIST; RESECTION:  - GASTROINTESTINAL STROMAL TUMOR.  - SEE CANCER SUMMARY BELOW.   CASE SUMMARY: (GASTROINTESTINAL STROMAL TUMOR (GIST): Resection)  Standard(s): AJCC-UICC 8   CLINICAL  Pre-resection Treatment: Previous biopsy, systemic therapy performed  (Gleevec)   SPECIMEN  Procedure: Resection (partial small bowel)    TUMOR  Tumor Focality:  Unifocal  Tumor Site: Jejunum  Tumor Size: 9.7 x 9.2 x 6.3 cm  Histologic Type: Gastrointestinal stromal tumor, spindle cell type  Mitotic Rate:  Specify mitotic rate per 5 mm squared: Less than 5 per 5  mm squared  Histologic Grade: G1, low grade  Necrosis: Not identified  Treatment Effect: Present, approximately 30% viable tumor remains  Risk Assessment: Moderate (24%) risk of progressive disease   MARGINS:  Margin status:  Margin(s) involved by GIST: Deep, see comment.   Comment:  Tumor is present on ink on the periphery of the tumor nodule.  The  nature of the specimen precludes accurate assessment of where the tumor  may have been attached in situ (apart from the small bowel attachment).  Proximal and distal bowel margins cannot be reliably assessed, given  that they were received detached from the tumor specimen.   REGIONAL LYMPH NODES  Regional lymph node status:  Regional lymph nodes present, all regional lymph nodes negative for  tumor.  Number of lymph nodes examined: Three    # CHRONIC ANEMIA EGD/capsule-WNL-2018 [Dr.vanga]-UGIB ; colonoscopy-2018 [[facility]] ; capsule-not done; hemoglobin around 8.8; iron saturation 5% [Dr.Tate].  Poor tolerance of p.o. 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   GIST (gastrointestinal stromal tumor) of small bowel, malignant (HCC)  03/12/2020 Initial Diagnosis   GIST (gastrointestinal stromal tumor) of small bowel, malignant (HCC)     HISTORY OF PRESENTING ILLNESS: Alone; ambulating independently. 6 L of oxygen nasal cannula.    [place] Anna Kotch 77 y.o.  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   Patient says that she has no new questions or concerns today. Loose stools is improved.   Patient denies any worsening swelling of the legs.  She continues to wear compression stockings.  Intermittently on diuretics. Denies any nausea vomiting from the Gleevec.  Review of Systems  Constitutional:  Positive for malaise/fatigue. Negative for chills, diaphoresis, fever and weight loss.  HENT:  Negative for nosebleeds and sore throat.   Eyes:  Negative for double vision.  Respiratory:  Positive for shortness of breath. Negative for cough, hemoptysis, sputum production and wheezing.   Cardiovascular:  Negative for chest pain, palpitations, orthopnea and leg swelling.  Gastrointestinal:  Negative for abdominal pain, blood in stool, constipation, diarrhea, heartburn, melena, nausea and vomiting.  Genitourinary:  Negative for dysuria, frequency and urgency.  Musculoskeletal:  Negative for back pain and joint pain.  Skin: Negative.  Negative for itching and rash.  Neurological:  Negative for dizziness, tingling, focal weakness, weakness and headaches.  Endo/Heme/Allergies:  Does not bruise/bleed easily.  Psychiatric/Behavioral:  Negative for depression. The patient is not nervous/anxious and does not have insomnia.     MEDICAL HISTORY:  Past Medical History:  Diagnosis Date   Anemia    Asthma 1950   CHF (congestive heart failure) (HCC)    COPD (chronic obstructive pulmonary disease) (HCC)    Cor pulmonale (HCC)    DVT (deep venous thrombosis) (HCC) 2018   GERD (gastroesophageal reflux disease)    GIST (gastrointestinal stromal tumor) of small bowel, malignant (HCC)    Glaucoma 1949   Hyperlipidemia    Hypertension    Obesity, unspecified    OSA treated with BiPAP    Personal history of tobacco use, presenting hazards to health    Pneumonia    Presence of  IVC filter    Pulmonary HTN (HCC)    Supplemental oxygen dependent    T2DM (type 2 diabetes mellitus) (HCC)    Varicose veins of lower extremities with other complications     SURGICAL HISTORY: Past Surgical History:  Procedure Laterality Date   BREAST BIOPSY Right 1991   BREAST BIOPSY Left 2013   COLONOSCOPY  2010   Dr. Mechele Collin   COLONOSCOPY  Jan 2016   Dr Mechele Collin   ESOPHAGOGASTRODUODENOSCOPY N/A 11/26/2016   Procedure: ESOPHAGOGASTRODUODENOSCOPY (EGD);  Surgeon: Toney Reil, MD;  Location: [facility] ENDOSCOPY;  Service: Gastroenterology;  Laterality: N/A;   GIVENS CAPSULE STUDY N/A 11/26/2016   Procedure: GIVENS CAPSULE STUDY, if EGD negative, plan to drop capsule with scope if EGD negative;  Surgeon: Toney Reil, MD;  Location: [facility] ENDOSCOPY;  Service: Gastroenterology;  Laterality: N/A;   IVC FILTER INSERTION N/A 11/14/2016   Procedure: IVC Filter Insertion;  Surgeon: Renford Dills, MD;  Location: [facility] INVASIVE CV LAB;  Service: Cardiovascular;  Laterality: N/A;   PARTIAL COLECTOMY N/A 11/05/2020   Procedure: SMALL BOWEL RESECTION, REMOVAL OF GIST TUMOR AND LYMPH NODES, UMBILICAL HERNIA REPAIR;  Surgeon: Earline Mayotte, MD;  Location: [facility] ORS;  Service: General;  Laterality: N/A;  small bowel resection, RNFA to assist; TAPP  block per anesthesia   RIGHT/LEFT HEART CATH AND CORONARY ANGIOGRAPHY N/A 06/30/2019   Procedure: RIGHT/LEFT HEART CATH AND CORONARY ANGIOGRAPHY;  Surgeon: Laurey Morale, MD;  Location[facility] INVASIVE CV LAB;  Service: Cardiovascular;  Laterality: N/A;   VEIN SURGERY Right 2006   Vein Closure Procedure; RF ablation of right GSV    SOCIAL HISTORY: Social History   Socioeconomic History   Marital status: Single    Spouse name: Not on file   Number of children: 2   Years of education: Not on file   Highest education level: Not on file  Occupational History   Occupation: CELL    Employer: RETIRED  Tobacco Use   Smoking status: Former     Current packs/day: 0.00    Average packs/day: 1.5 packs/day for 20.0 years (30.0 ttl pk-yrs)    Types: Cigarettes    Start date: 59    Quit date: 1999    Years since quitting: 25.9   Smokeless tobacco: Never  Vaping Use   Vaping status: Never Used  Substance and Sexual Activity   Alcohol use: No    Alcohol/week: 0.0 standard drinks of alcohol   Drug use: No   Sexual activity: Not Currently  Other Topics Concern   Not on file  Social History Narrative   Independent at baseline. Lives at home with her brother; in [place]. Quit smoking 20 years; no alcohol.    Social Determinants of Health   Financial Resource Strain: Not on file  Food Insecurity: Not on file  Transportation Needs: Not on file  Physical Activity: Not on file  Stress: Not on file  Social Connections: Not on file  Intimate Partner Violence: Not on file    FAMILY HISTORY: Family History  Problem Relation Age of Onset   Prostate cancer Father    Other Sister        mouth cancer   Hypertension Mother     ALLERGIES:  has No Known Allergies.  MEDICATIONS:  Current Outpatient Medications  Medication Sig Dispense Refill   ACCU-CHEK AVIVA PLUS test strip 1 each 2 (two) times daily.     Accu-Chek Softclix Lancets lancets USE 1 TO CHECK GLUCOSE TWICE DAILY     acetaminophen (TYLENOL) 325 MG tablet Take 2 tablets (650 mg total) by mouth every 6 (six) hours as needed for mild pain (or Fever >/= 101).     aspirin 81 MG chewable tablet Chew 1 tablet (81 mg total) by mouth daily. 30 tablet 0   clobetasol cream (TEMOVATE) 0.05 % Apply 1 Application topically 3 (three) times a week.     dorzolamide-timolol (COSOPT) 2-0.5 % ophthalmic solution 1 drop 2 (two) times daily.     FARXIGA 10 MG TABS tablet Take 1 tablet by mouth once daily 90 tablet 3   ferrous gluconate (FERGON) 325 MG tablet Take 325 mg by mouth in the morning and at bedtime.      furosemide (LASIX) 40 MG tablet Take 40 mg by mouth daily.     imatinib  (GLEEVEC) 100 MG tablet TAKE 4 TABLETS (400 MG TOTAL) BY MOUTH DAILY. TAKE WITH MEALS AND LARGE GLASS OF WATER 120 tablet 2   insulin glargine (LANTUS) 100 UNIT/ML injection Inject 32 Units into the skin in the morning.     latanoprost (XALATAN) 0.005 % ophthalmic solution Place 1 drop into both eyes at bedtime.      loperamide (IMODIUM) 2 MG capsule Take by mouth as needed for diarrhea or loose stools.  lovastatin (MEVACOR) 20 MG tablet Take 20 mg by mouth every evening.      metFORMIN (GLUCOPHAGE) 1000 MG tablet Take 1,000 mg by mouth daily.      Multiple Vitamin (MULTIVITAMIN) tablet Take 1 tablet by mouth daily.     omeprazole (PRILOSEC) 40 MG capsule Take 40 mg by mouth daily.     OPSUMIT 10 MG tablet TAKE 1 TABLET DAILY. 30 tablet 11   potassium chloride SA (KLOR-CON) 20 MEQ tablet Take 1 tablet (20 mEq total) by mouth daily. 90 tablet 3   tadalafil, PAH, (ADCIRCA) 20 MG tablet TAKE 2 TABLETS DAILY 60 tablet 11   timolol (TIMOPTIC) 0.5 % ophthalmic solution Place 1 drop into both eyes 2 (two) times daily.      Tiotropium Bromide-Olodaterol (STIOLTO RESPIMAT) 2.5-2.5 MCG/ACT AERS INHALE 2 PUFFS BY MOUTH ONCE DAILY 4 g 2   No current facility-administered medications for this visit.      PHYSICAL EXAMINATION:   Vitals:   03/17/23 1434 03/17/23 1449  BP: (!) 172/54 (!) 127/48  Pulse: (!) 102   Temp: 98.8 F (37.1 C)   SpO2: (!) 83%    Filed Weights   03/17/23 1434  Weight: 212 lb 6.4 oz (96.3 kg)    Physical Exam HENT:     Head: Normocephalic and atraumatic.     Mouth/Throat:     Pharynx: No oropharyngeal exudate.  Eyes:     Pupils: Pupils are equal, round, and reactive to light.  Cardiovascular:     Rate and Rhythm: Normal rate and regular rhythm.  Pulmonary:     Effort: No respiratory distress.     Breath sounds: No wheezing.     Comments: Decreased breath sounds. Abdominal:     General: Bowel sounds are normal. There is no distension.     Palpations: Abdomen  is soft. There is no mass.     Tenderness: There is no abdominal tenderness. There is no guarding or rebound.  Musculoskeletal:        General: No tenderness. Normal range of motion.     Cervical back: Normal range of motion and neck supple.  Skin:    General: Skin is warm.  Neurological:     Mental Status: She is alert and oriented to person, place, and time.  Psychiatric:        Mood and Affect: Affect normal.     LABORATORY DATA:  I have reviewed the data as listed Lab Results  Component Value Date   WBC 6.2 03/17/2023   HGB 11.7 (L) 03/17/2023   HCT 35.7 (L) 03/17/2023   MCV 96.5 03/17/2023   PLT 229 03/17/2023   Recent Labs    09/05/22 1413 12/02/22 1044 12/10/22 1302 03/17/23 1423  NA 135 141 135 136  K 4.1 4.4 4.2 4.1  CL 101 104 103 102  CO2 24 24 24 23   GLUCOSE 129* 73 130* 170*  BUN 24* 26 23 22   CREATININE 1.17* 1.07* 1.31* 1.14*  CALCIUM 9.0 8.9 8.9 8.7*  GFRNONAA 48*  --  42* 50*  PROT 7.2  --  7.2 7.0  ALBUMIN 3.9  --  3.9 3.8  AST 24  --  20 23  ALT 14  --  15 16  ALKPHOS 43  --  46 43  BILITOT 0.2*  --  0.3 0.5     No results found.   GIST (gastrointestinal stromal tumor) of small bowel, malignant (HCC) # NOV 2021-GIST of small bowel-low grade- high  risk [ non-gastric primary GIST >=5 cm.] STAGE II [T3N0]-s/p neoadjuvant therapy with Gleevec x6 months [November-end of June 2022]-s/p resection ~30% viable tumor noted post resection specimen.  Currently on adjuvant Gleevec [3-5 years].  No evidence of recurrence.    # AUG 2024- CT AP-  Presumed interval resection of dominant left abdominal mass;  No typical findings of abdominopelvic recurrent or metastatic Disease; Small bowel mesenteric adenopathy is minimally improved from the prior PET, favored to be reactive.  # At length I discussed re: stopping now [3 years] vs continuing for 5 years-.  Since patient is high risk GIST-I would recommend 5 years especially as patient is tolerating Gleevec  without any major side effects.  However if she does develop any side effects we will discontinue Gleevec  # Tolerating Gleevec well except for mild side effects [diarrhea]; swelling in the feet [see below]-  # Swelling in the feet-grade 1 Continue compression stockings/diuretics as needed.  stable  #CHF [Dr.McClean, cards-[place]]/COPD home O2 4-6Lits;-borderline respiratory status- stable  # CKD stage III-[diuretics]  stable  # reflex- on PPI- stable.   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  Today hemoglobin is   11.8. Discussed with pt- FEB 2024-- I sat-18%;Ferritin-32. Continue PO iron one pill every other day-  HOLD venofer today-  stable  # Myalgias/arthralgias question Gleevec versus others- recommend ca+vit D; tylenol as needed-stable   # DISPOSITION:.   # HOLD venofer today # Follow up in 3 month-  MD; labs-cbc/cmp;iron studies/ferritin; possible venofer;CT AP prior-  Dr.B     All questions were answered. The patient knows to call the clinic with any problems, questions or concerns.    Earna Coder, MD 03/17/2023 3:14 PM

## 2023-03-19 ENCOUNTER — Other Ambulatory Visit (HOSPITAL_COMMUNITY): Payer: Self-pay

## 2023-03-19 ENCOUNTER — Telehealth (HOSPITAL_COMMUNITY): Payer: Self-pay | Admitting: Pharmacist

## 2023-03-19 NOTE — Telephone Encounter (Signed)
Patient Advocate Encounter   Received notification from [employer] that prior authorizations for Opsumit and tadalafil are required.   PA submitted on CoverMyMeds Key [id number], [id number] Status is pending   Will continue to follow.

## 2023-03-19 NOTE — Telephone Encounter (Signed)
Advanced Heart Failure Patient Advocate Encounter  Prior Authorizations for  Opsumit and tadalafil are approved.     Effective dates: 04/15/23 through 04/13/24  Karle Plumber, PharmD, BCPS, BCCP, CPP Heart Failure Clinic Pharmacist [phone]

## 2023-03-30 ENCOUNTER — Other Ambulatory Visit (HOSPITAL_COMMUNITY): Payer: Self-pay

## 2023-03-30 ENCOUNTER — Other Ambulatory Visit: Payer: Self-pay | Admitting: Internal Medicine

## 2023-04-01 ENCOUNTER — Other Ambulatory Visit: Payer: Self-pay

## 2023-04-01 NOTE — Progress Notes (Signed)
Specialty Pharmacy Refill Coordination Note  Susan Barajas is a 77 y.o. female contacted today regarding refills of specialty medication(s) Imatinib Mesylate (GLEEVEC)   Patient requested Delivery   Delivery date: 04/03/23   Verified address: [address] [place], [place] [address]   Medication will be filled on 12.19.24.

## 2023-04-21 ENCOUNTER — Other Ambulatory Visit (HOSPITAL_COMMUNITY): Payer: Self-pay | Admitting: Cardiology

## 2023-04-28 ENCOUNTER — Other Ambulatory Visit: Payer: Self-pay

## 2023-04-28 NOTE — Progress Notes (Signed)
 Specialty Pharmacy Refill Coordination Note  Susan  Trenese Barajas is a 78 y.o. female contacted today regarding refills of specialty medication(s) Imatinib  Mesylate (GLEEVEC )   Patient requested Delivery   Delivery date: 04/30/23   Verified address: [address]   [facility] [facility] [address]   Medication will be filled on 04/29/23.

## 2023-04-29 ENCOUNTER — Other Ambulatory Visit: Payer: Self-pay

## 2023-05-05 ENCOUNTER — Other Ambulatory Visit: Payer: Self-pay | Admitting: Internal Medicine

## 2023-05-15 ENCOUNTER — Ambulatory Visit (INDEPENDENT_AMBULATORY_CARE_PROVIDER_SITE_OTHER): Payer: 59 | Admitting: Internal Medicine

## 2023-05-15 ENCOUNTER — Encounter: Payer: Self-pay | Admitting: Internal Medicine

## 2023-05-15 VITALS — BP 110/64 | HR 72 | Temp 97.6°F | Ht 65.0 in | Wt 212.0 lb

## 2023-05-15 DIAGNOSIS — J9611 Chronic respiratory failure with hypoxia: Secondary | ICD-10-CM | POA: Diagnosis not present

## 2023-05-15 DIAGNOSIS — J440 Chronic obstructive pulmonary disease with acute lower respiratory infection: Secondary | ICD-10-CM | POA: Diagnosis not present

## 2023-05-15 DIAGNOSIS — G4733 Obstructive sleep apnea (adult) (pediatric): Secondary | ICD-10-CM | POA: Diagnosis not present

## 2023-05-15 DIAGNOSIS — R0689 Other abnormalities of breathing: Secondary | ICD-10-CM

## 2023-05-15 NOTE — Progress Notes (Unsigned)
@Patient  ID: [place] Anna Mccarroll, female    DOB: 03-15-1946, 78 y.o.   MRN: [id number]     SYNOPSIS 78 year old female former smoker followed for severe obstructive sleep apnea on nocturnal BiPAP and chronic respiratory failure on oxygen, COPD .  She has underlying pulmonary hypertension secondary to underlying sleep apnea, diastolic heart failure and cor pulmonale.  She is followed by cardiology and is on Opsumit and tadalafil COVID-19 infection with COVID-pneumonia in September 2020 Previous DVT in 2018 was on anticoagulation but had to be stopped due to refractory anemia with IVC filter placed  TEST/EVENTS :  RHC/LHC was done in 3/21, showing mild CAD; mean RA 12, PA 68/25 mean 42, mean PCWP 9, CI 4.1, PVR 4.13 WU.  V/Q scan in 3/21 did not show evidence for chronic PE.   High resolution chest CT in 3/21 did not show evidence for ILD, it did show mild-moderate emphysema.     pulmonary function testing in March 2021 showed moderate airflow obstruction with an FEV1 at 66%, ratio 56, FVC 91%, positive bronchodilator response.  Significant mid flow obstruction and reversibility.  DLCO 32%.  PET scan 09/2020 -Slight decrease in size of a left upper quadrant mass which demonstrates similar mild hypermetabolism. 2. A hypermetabolic ileocolic mesenteric node is slightly increased in size since the prior exam, suspicious metastatic disease. This could alternatively be reactive, given the appearance of the colon. 3. Chronic sigmoid wall thickening and diffuse colonic hypermetabolism in the setting of marked diverticulosis. Likely muscular hypertrophy and chronic diverticulitis. Correlate with any symptoms to suggest acute superimposed inflammation. Also correlate with colon cancer screening history to exclude underlying neoplasm.   CC  Follow-up assessment for COPD Follow-up assessment for OSA Follow-up assessment for chronic hypoxic respiratory failure Follow-up pulmonary  hypertension   HPI Regarding COPD Respiratory insufficiency seems to be stable Moderate COPD    Placed on Stiolto August 2021 Continues to do well with this inhaler No exacerbation at this time No evidence of heart failure at this time No evidence or signs of infection at this time No respiratory distress No fevers, chills, nausea, vomiting, diarrhea No evidence of lower extremity edema No evidence hemoptysis   Patient says that this has really helped her feels that the inhaler has help with her breathing.   No increased albuterol use.    Regarding chronic hypoxic respiratory failure No significant changes over the last 1 year Short winded with prolonged walking Oxygen 4 L at home Is a portable oxygen concentrator Denies any recent increased oxygen demands    Assessment of OSA Patient currently on BiPAP at bedtime CPAP download previously has shown excellent compliance Current download shows significant improvement of AHI reduced to 0.5 BiPAP 18/13 100% compliance Download reviewed in detail with patient     Regarding pulmonary hypertension She is followed by cardiology for primary hypertension Currently on tadalafil and Opsumit Follow-up echo shows improvement of pulmonary artery pressures at 43 back in May 2022 Repeat echocardiogram 2024 shows moderate elevation of pulmonary artery pressure Since her shortness of breath has improved with these medications denies any orthopnea or swelling  Oncology history  Patient has been diagnosed recently with a gastrointestinal stromal tumor.  She has been followed by oncology.  Is currently on Gleevec.        No Known Allergies  Immunization History  Administered Date(s) Administered   Fluad Quad(high Dose 65+) 03/14/2019   Influenza Split 01/25/2018   Influenza-Unspecified 01/09/2020, 02/24/2022   Moderna Sars-Covid-2 Vaccination 05/27/2019,  06/27/2019   Pneumococcal Polysaccharide-23 04/13/2019    Past  Medical History:  Diagnosis Date   Anemia    Asthma 1950   CHF (congestive heart failure) (HCC)    COPD (chronic obstructive pulmonary disease) (HCC)    Cor pulmonale (HCC)    DVT (deep venous thrombosis) (HCC) 2018   GERD (gastroesophageal reflux disease)    GIST (gastrointestinal stromal tumor) of small bowel, malignant (HCC)    Glaucoma 1949   Hyperlipidemia    Hypertension    Obesity, unspecified    OSA treated with BiPAP    Personal history of tobacco use, presenting hazards to health    Pneumonia    Presence of IVC filter    Pulmonary HTN (HCC)    Supplemental oxygen dependent    T2DM (type 2 diabetes mellitus) (HCC)    Varicose veins of lower extremities with other complications     Tobacco History: Social History   Tobacco Use  Smoking Status Former   Current packs/day: 0.00   Average packs/day: 1.5 packs/day for 20.0 years (30.0 ttl pk-yrs)   Types: Cigarettes   Start date: 33   Quit date: 1999   Years since quitting: 26.1  Smokeless Tobacco Never   Counseling given: Not Answered    Outpatient Medications Prior to Visit  Medication Sig Dispense Refill   ACCU-CHEK AVIVA PLUS test strip 1 each 2 (two) times daily.     Accu-Chek Softclix Lancets lancets USE 1 TO CHECK GLUCOSE TWICE DAILY     acetaminophen (TYLENOL) 325 MG tablet Take 2 tablets (650 mg total) by mouth every 6 (six) hours as needed for mild pain (or Fever >/= 101).     aspirin 81 MG chewable tablet Chew 1 tablet (81 mg total) by mouth daily. 30 tablet 0   clobetasol cream (TEMOVATE) 0.05 % Apply 1 Application topically 3 (three) times a week.     dorzolamide-timolol (COSOPT) 2-0.5 % ophthalmic solution 1 drop 2 (two) times daily.     FARXIGA 10 MG TABS tablet Take 1 tablet by mouth once daily 90 tablet 3   ferrous gluconate (FERGON) 325 MG tablet Take 325 mg by mouth in the morning and at bedtime.      furosemide (LASIX) 40 MG tablet Take 40 mg by mouth daily.     imatinib (GLEEVEC) 100 MG  tablet TAKE 4 TABLETS (400 MG TOTAL) BY MOUTH DAILY. TAKE WITH MEALS AND LARGE GLASS OF WATER 120 tablet 2   insulin glargine (LANTUS) 100 UNIT/ML injection Inject 32 Units into the skin in the morning.     latanoprost (XALATAN) 0.005 % ophthalmic solution Place 1 drop into both eyes at bedtime.      loperamide (IMODIUM) 2 MG capsule Take by mouth as needed for diarrhea or loose stools.     lovastatin (MEVACOR) 20 MG tablet Take 20 mg by mouth every evening.      metFORMIN (GLUCOPHAGE) 1000 MG tablet Take 1,000 mg by mouth daily.      Multiple Vitamin (MULTIVITAMIN) tablet Take 1 tablet by mouth daily.     omeprazole (PRILOSEC) 40 MG capsule Take 40 mg by mouth daily.     OPSUMIT 10 MG tablet TAKE 1 TABLET DAILY. 30 tablet 0   potassium chloride SA (KLOR-CON) 20 MEQ tablet Take 1 tablet (20 mEq total) by mouth daily. 90 tablet 3   STIOLTO RESPIMAT 2.5-2.5 MCG/ACT AERS INHALE 2 PUFFS BY MOUTH ONCE DAILY 4 g 0   tadalafil, PAH, (ADCIRCA) 20 MG  tablet TAKE 2 TABLETS DAILY 60 tablet 11   timolol (TIMOPTIC) 0.5 % ophthalmic solution Place 1 drop into both eyes 2 (two) times daily.      No facility-administered medications prior to visit.    BP 110/64 (BP Location: Left Arm, Patient Position: Sitting, Cuff Size: Normal)   Pulse 72   Temp 97.6 F (36.4 C) (Temporal)   Ht 5\' 5"  (1.651 m)   Wt 212 lb (96.2 kg)   SpO2 97% Comment: 6L oxygen  BMI 35.28 kg/m    Review of Systems: Gen:  Denies  fever, sweats, chills weight loss  HEENT: Denies blurred vision, double vision, ear pain, eye pain, hearing loss, nose bleeds, sore throat Cardiac:  No dizziness, chest pain or heaviness, chest tightness,edema, No JVD Resp:   No cough, -sputum production, +shortness of breath,-wheezing, -hemoptysis,  Other:  All other systems negative   Physical Examination:   General Appearance: No distress  EYES PERRLA, EOM intact.   NECK Supple, No JVD Pulmonary: normal breath sounds, No wheezing.   CardiovascularNormal S1,S2.  No m/r/g.   Abdomen: Benign, Soft, non-tender. Neurology UE/LE 5/5 strength, no focal deficits Ext pulses intact, cap refill intact ALL OTHER ROS ARE NEGATIVE     Lab Results:  CBC    Component Value Date/Time   WBC 6.2 03/17/2023 1423   WBC 5.4 06/03/2022 1311   RBC 3.70 (L) 03/17/2023 1423   HGB 11.7 (L) 03/17/2023 1423   HCT 35.7 (L) 03/17/2023 1423   HCT 27.1 (L) 11/25/2016 2153   PLT 229 03/17/2023 1423   MCV 96.5 03/17/2023 1423   MCH 31.6 03/17/2023 1423   MCHC 32.8 03/17/2023 1423   RDW 14.4 03/17/2023 1423   LYMPHSABS 0.9 03/17/2023 1423   MONOABS 0.5 03/17/2023 1423   EOSABS 0.0 03/17/2023 1423   BASOSABS 0.0 03/17/2023 1423    BMET    Component Value Date/Time   NA 136 03/17/2023 1423   NA 141 12/02/2022 1044   K 4.1 03/17/2023 1423   CL 102 03/17/2023 1423   CO2 23 03/17/2023 1423   GLUCOSE 170 (H) 03/17/2023 1423   BUN 22 03/17/2023 1423   BUN 26 12/02/2022 1044   CREATININE 1.14 (H) 03/17/2023 1423   CALCIUM 8.7 (L) 03/17/2023 1423   GFRNONAA 50 (L) 03/17/2023 1423   GFRAA >60 10/28/2019 1213    BNP    Component Value Date/Time   BNP 84.8 12/02/2022 1044   BNP 97.9 08/12/2022 0908    ProBNP    Component Value Date/Time   PROBNP 819.0 (H) 04/13/2019 1712    Imaging: No results found.  Administration History     None           Latest Ref Rng & Units 07/13/2019    1:18 PM  PFT Results  FVC-Pre L 2.04   FVC-Predicted Pre % 82   FVC-Post L 2.26   FVC-Predicted Post % 91   Pre FEV1/FVC % % 56   Post FEV1/FCV % % 56   FEV1-Pre L 1.14   FEV1-Predicted Pre % 59   FEV1-Post L 1.28   DLCO uncorrected ml/min/mmHg 6.67   DLCO UNC% % 32   DLVA Predicted % 49   TLC L 5.01   TLC % Predicted % 93   RV % Predicted % 127   PFTs reviewed in detail with patient from 2021 FEV1 FVC ratio is 56% predicted FEV1 is 59% predicted DLCO is significantly reduced No significant bronchodilator  response  Assessment & Plan:   78 year old pleasant African-American female seen today for follow-up assessment for multiple medical issues including end-stage COPD with chronic hypoxic respiratory failure with diastolic heart failure with pulmonary hypertension likely related to OSA and COPD and emphysema associated with chronic hypoxic respiratory failure on oxygen therapy  Respiratory insufficiency Continue oxygen as prescribed Multifactorial etiology  End-stage COPD Continue inhalers as prescribed At some point may patient may need to be  transition to nebulized therapy No exacerbation at this time No evidence of heart failure at this time No evidence or signs of infection at this time No respiratory distress No fevers, chills, nausea, vomiting, diarrhea No evidence of lower extremity edema No evidence hemoptysis  Regarding ASA assessment Excellent compliance Nocturnal BiPAP BiPAP 18/13 AHI reduced 0.5 Patient is a benefits from therapy  Chronic hypoxic respiratory failure Continue oxygen as needed O2 sat goals 88 to 92% Patient use and benefits from therapy Needs this for survival  Primary hypertension Follow-up with cardiology Continue current regimen Repeat echo annually and as needed   MEDICATION ADJUSTMENTS/LABS AND TESTS ORDERED:    CURRENT MEDICATIONS REVIEWED AT LENGTH WITH PATIENT TODAY   Patient  satisfied with Plan of action and management. All questions answered   Follow up 6 months   I spent a total of 45 minutes reviewing chart data, face-to-face evaluation with the patient, counseling and coordination of care as detailed above.      Lucie Leather, M.D.  [facility]  Medical Director [facility] Medical Director [facility] Cardio-Pulmonary Department

## 2023-05-15 NOTE — Patient Instructions (Addendum)
Excellent job keep up the great work!!! A+  Continue BiPAP as prescribed Continue oxygen as prescribed Continue inhalers as prescribed  Follow-up with cardiologist   Avoid secondhand smoke Avoid SICK contacts Please Mask when appropriate Please Keep up-to-date with vaccinations  Patient Instructions  Continue to use CPAP every night, minimum of 4-6 hours a night.  Change equipment every 30 days or as directed by DME.  Wash your tubing with warm soap and water daily, hang to dry. Wash humidifier portion weekly. Use bottled, distilled water and change daily   Be aware of reduced alertness and do not drive or operate heavy machinery if experiencing this or drowsiness.  Exercise encouraged, as tolerated. Encouraged proper weight management.  Important to get eight or more hours of sleep  Limiting the use of the computer and television before bedtime.  Decrease naps during the day, so night time sleep will become enhanced.  Limit caffeine, and sleep deprivation.  HTN, stroke, uncontrolled diabetes and heart failure are potential risk factors.  Risk of untreated sleep apnea including cardiac arrhthymias, stroke, DM, pulm HTN.

## 2023-05-20 ENCOUNTER — Other Ambulatory Visit (HOSPITAL_COMMUNITY): Payer: Self-pay

## 2023-05-20 ENCOUNTER — Other Ambulatory Visit: Payer: Self-pay

## 2023-05-20 ENCOUNTER — Other Ambulatory Visit: Payer: Self-pay | Admitting: Internal Medicine

## 2023-05-20 DIAGNOSIS — C49A3 Gastrointestinal stromal tumor of small intestine: Secondary | ICD-10-CM

## 2023-05-20 MED ORDER — IMATINIB MESYLATE 100 MG PO TABS
ORAL_TABLET | ORAL | 2 refills | Status: DC
Start: 2023-05-20 — End: 2023-08-20
  Filled 2023-05-20: qty 120, 30d supply, fill #0
  Filled 2023-06-23: qty 120, 30d supply, fill #1
  Filled 2023-07-21: qty 120, 30d supply, fill #2

## 2023-05-20 NOTE — Progress Notes (Signed)
 Specialty Pharmacy Refill Coordination Note  Susan  Connelly Barajas is a 78 y.o. female contacted today regarding refills of specialty medication(s) Imatinib  Mesylate (GLEEVEC )   Patient requested Delivery   Delivery date: 05/26/23   Verified address: [address]   [facility] [id number]   Medication will be filled on 05/25/23. Pending Refill Reqest.

## 2023-05-21 ENCOUNTER — Institutional Professional Consult (permissible substitution): Payer: 59 | Admitting: Pulmonary Disease

## 2023-05-22 ENCOUNTER — Other Ambulatory Visit (HOSPITAL_COMMUNITY): Payer: Self-pay | Admitting: Cardiology

## 2023-05-25 ENCOUNTER — Other Ambulatory Visit: Payer: Self-pay

## 2023-05-25 NOTE — Progress Notes (Signed)
 Sw patient to inform of delay, shipping 2/11

## 2023-06-04 ENCOUNTER — Ambulatory Visit
Admission: RE | Admit: 2023-06-04 | Discharge: 2023-06-04 | Disposition: A | Discharge status: Home / Self Care | Payer: 59 | Source: Ambulatory Visit | Attending: Internal Medicine | Admitting: Internal Medicine

## 2023-06-04 DIAGNOSIS — C49A3 Gastrointestinal stromal tumor of small intestine: Secondary | ICD-10-CM | POA: Diagnosis present

## 2023-06-08 ENCOUNTER — Other Ambulatory Visit: Payer: Self-pay | Admitting: Internal Medicine

## 2023-06-12 ENCOUNTER — Other Ambulatory Visit: Payer: Self-pay

## 2023-06-15 ENCOUNTER — Inpatient Hospital Stay (HOSPITAL_BASED_OUTPATIENT_CLINIC_OR_DEPARTMENT_OTHER): Payer: 59 | Admitting: Internal Medicine

## 2023-06-15 ENCOUNTER — Inpatient Hospital Stay: Payer: 59

## 2023-06-15 ENCOUNTER — Encounter: Payer: Self-pay | Admitting: Internal Medicine

## 2023-06-15 ENCOUNTER — Inpatient Hospital Stay: Payer: 59 | Attending: Internal Medicine

## 2023-06-15 VITALS — BP 125/43 | HR 61 | Temp 98.0°F | Resp 20 | Ht 65.0 in | Wt 212.6 lb

## 2023-06-15 DIAGNOSIS — N183 Chronic kidney disease, stage 3 unspecified: Secondary | ICD-10-CM | POA: Diagnosis not present

## 2023-06-15 DIAGNOSIS — Z79899 Other long term (current) drug therapy: Secondary | ICD-10-CM | POA: Diagnosis not present

## 2023-06-15 DIAGNOSIS — K5730 Diverticulosis of large intestine without perforation or abscess without bleeding: Secondary | ICD-10-CM | POA: Diagnosis not present

## 2023-06-15 DIAGNOSIS — I509 Heart failure, unspecified: Secondary | ICD-10-CM | POA: Insufficient documentation

## 2023-06-15 DIAGNOSIS — Z794 Long term (current) use of insulin: Secondary | ICD-10-CM | POA: Insufficient documentation

## 2023-06-15 DIAGNOSIS — I130 Hypertensive heart and chronic kidney disease with heart failure and stage 1 through stage 4 chronic kidney disease, or unspecified chronic kidney disease: Secondary | ICD-10-CM | POA: Insufficient documentation

## 2023-06-15 DIAGNOSIS — Z7984 Long term (current) use of oral hypoglycemic drugs: Secondary | ICD-10-CM | POA: Diagnosis not present

## 2023-06-15 DIAGNOSIS — Z7982 Long term (current) use of aspirin: Secondary | ICD-10-CM | POA: Insufficient documentation

## 2023-06-15 DIAGNOSIS — J4489 Other specified chronic obstructive pulmonary disease: Secondary | ICD-10-CM | POA: Insufficient documentation

## 2023-06-15 DIAGNOSIS — D509 Iron deficiency anemia, unspecified: Secondary | ICD-10-CM | POA: Insufficient documentation

## 2023-06-15 DIAGNOSIS — C49A3 Gastrointestinal stromal tumor of small intestine: Secondary | ICD-10-CM | POA: Insufficient documentation

## 2023-06-15 DIAGNOSIS — Z87891 Personal history of nicotine dependence: Secondary | ICD-10-CM | POA: Diagnosis not present

## 2023-06-15 DIAGNOSIS — Z8042 Family history of malignant neoplasm of prostate: Secondary | ICD-10-CM | POA: Diagnosis not present

## 2023-06-15 DIAGNOSIS — I700 Atherosclerosis of aorta: Secondary | ICD-10-CM | POA: Diagnosis not present

## 2023-06-15 DIAGNOSIS — N200 Calculus of kidney: Secondary | ICD-10-CM | POA: Insufficient documentation

## 2023-06-15 LAB — CBC WITH DIFFERENTIAL (CANCER CENTER ONLY)
Abs Immature Granulocytes: 0.02 10*3/uL (ref 0.00–0.07)
Basophils Absolute: 0 10*3/uL (ref 0.0–0.1)
Basophils Relative: 1 %
Eosinophils Absolute: 0.1 10*3/uL (ref 0.0–0.5)
Eosinophils Relative: 2 %
HCT: 35.4 % — ABNORMAL LOW (ref 36.0–46.0)
Hemoglobin: 11.4 g/dL — ABNORMAL LOW (ref 12.0–15.0)
Immature Granulocytes: 0 %
Lymphocytes Relative: 17 %
Lymphs Abs: 1.1 10*3/uL (ref 0.7–4.0)
MCH: 30.6 pg (ref 26.0–34.0)
MCHC: 32.2 g/dL (ref 30.0–36.0)
MCV: 95.2 fL (ref 80.0–100.0)
Monocytes Absolute: 0.5 10*3/uL (ref 0.1–1.0)
Monocytes Relative: 8 %
Neutro Abs: 4.8 10*3/uL (ref 1.7–7.7)
Neutrophils Relative %: 72 %
Platelet Count: 230 10*3/uL (ref 150–400)
RBC: 3.72 MIL/uL — ABNORMAL LOW (ref 3.87–5.11)
RDW: 14.2 % (ref 11.5–15.5)
WBC Count: 6.6 10*3/uL (ref 4.0–10.5)
nRBC: 0 % (ref 0.0–0.2)

## 2023-06-15 LAB — CMP (CANCER CENTER ONLY)
ALT: 18 U/L (ref 0–44)
AST: 20 U/L (ref 15–41)
Albumin: 3.9 g/dL (ref 3.5–5.0)
Alkaline Phosphatase: 44 U/L (ref 38–126)
Anion gap: 9 (ref 5–15)
BUN: 27 mg/dL — ABNORMAL HIGH (ref 8–23)
CO2: 26 mmol/L (ref 22–32)
Calcium: 9.1 mg/dL (ref 8.9–10.3)
Chloride: 103 mmol/L (ref 98–111)
Creatinine: 1.21 mg/dL — ABNORMAL HIGH (ref 0.44–1.00)
GFR, Estimated: 46 mL/min — ABNORMAL LOW (ref >60)
Glucose, Bld: 88 mg/dL (ref 70–99)
Potassium: 4 mmol/L (ref 3.5–5.1)
Sodium: 138 mmol/L (ref 135–145)
Total Bilirubin: 0.5 mg/dL (ref 0.0–1.2)
Total Protein: 7.2 g/dL (ref 6.5–8.1)

## 2023-06-15 LAB — IRON AND TIBC
Iron: 82 ug/dL (ref 28–170)
Saturation Ratios: 21 % (ref 10.4–31.8)
TIBC: 396 ug/dL (ref 250–450)
UIBC: 314 ug/dL

## 2023-06-15 LAB — FERRITIN: Ferritin: 12 ng/mL (ref 11–307)

## 2023-06-15 NOTE — Assessment & Plan Note (Addendum)
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  Currently on adjuvant Gleevec [3-5 years].  No evidence of recurrence.    # FEB 2025- - CT AP-  No findings for recurrent tumor, acute inflammatory process or obstructive findings. Persistent mesenteric and retroperitoneal lymph nodes, similar to the prior study. No new or progressive findings.  # At length I discussed re: stopping now [3 years] vs continuing for 5 years-.  Since patient is high risk GIST-I would recommend 5 years especially as patient is tolerating Gleevec without any major side effects.  However if she does develop any side effects we will discontinue Gleevec  # Tolerating Gleevec well except for mild side effects [diarrhea]; swelling in the feet [see below]-stable.   # Swelling in the feet-grade 1 Continue compression stockings/diuretics as needed.  stable.   #CHF [Dr.McClean, cards-[place]]/COPD home O2 4-6Lits;-borderline respiratory status- stable.   # CKD stage III-[diuretics]  stable.   # reflex- on PPI- stable.   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  Today hemoglobin is   11.8. Discussed with pt- FEB 2024-- I sat-18%;Ferritin-32. Continue PO iron one pill every other day-  HOLD venofer today-  stable.   # Myalgias/arthralgias question Gleevec versus others- recommend ca+vit D; tylenol as needed-stable   #Incidental findings on Imaging  CT , 2025: Stable enlarged fibroid uterus; Stable small left renal calculus; Stable advanced atherosclerotic calcifications involving the aorta and iliac arteries; Stable IVC filterI reviewed/discussed/counseled the patient.   # DISPOSITION:.   # HOLD venofer today # Follow up in 3 month-  MD; labs-cbc/cmp;iron studies/ferritin; possible venofer;   Dr.B

## 2023-06-15 NOTE — Progress Notes (Signed)
 CT CAP 06/04/23.

## 2023-06-15 NOTE — Progress Notes (Signed)
 [facility] CONSULT NOTE  Patient Care Team: Jaclyn Shaggy, MD as PCP - General (Unknown Physician Specialty) Iran Ouch, MD as PCP - Cardiology (Cardiology) Kieth Brightly, MD (General Surgery) Jaclyn Shaggy, MD (Internal Medicine) Gilda Crease, Latina Craver, MD (Vascular Surgery) Charlott Rakes, MD as Consulting Physician (Gastroenterology) Earna Coder, MD as Consulting Physician (Hematology and Oncology)  CHIEF COMPLAINTS/PURPOSE OF CONSULTATION: GIST   HEMATOLOGY HISTORY  Oncology History Overview Note  NOV 2021- CT Bx- TUMOR  Tumor Site: Peritoneum, jejunal  Histologic Type: Gastrointestinal stromal tumor, spindle cell type  Mitotic Rate: 1 per 5 mm squared  Histologic Grade: G1, low grade (mitotic rate less than or equal to 5  per 5 mm squared)- STAGE T2N1 [possible mesneteric LN]; CT A/P-9.8 x9.8 cm  # NOV 29th 2021- GLEEVEC 400 mg/day neoadjuvant therapy 6 months.   SURGICAL PATHOLOGY  CASE: [id number]  PATIENT: Susan Barajas  Surgical Pathology Report      Specimen Submitted:  A. Lymph node, mesenteric  B. Lymph node, Proximal mesenteric  C. Small bowel with GIST   Clinical History: GIST small bowel       DIAGNOSIS:  A. LYMPH NODE, MESENTERIC; EXCISIONAL BIOPSY:  - REACTIVE LYMPHOID FOLLICULAR HYPERPLASIA WITH PROGRESSIVE  TRANSFORMATION OF GERMINAL CENTERS.  - NEGATIVE FOR MALIGNANCY, ONE LYMPH NODE (0/1).   B. LYMPH NODE, PROXIMAL MESENTERIC; EXCISIONAL BIOPSY:  - REACTIVE LYMPHOID FOLLICULAR HYPERPLASIA.  - NEGATIVE FOR MALIGNANCY, TWO LYMPH NODES (0/2).   C. SMALL BOWEL WITH GIST; RESECTION:  - GASTROINTESTINAL STROMAL TUMOR.  - SEE CANCER SUMMARY BELOW.   CASE SUMMARY: (GASTROINTESTINAL STROMAL TUMOR (GIST): Resection)  Standard(s): AJCC-UICC 8   CLINICAL  Pre-resection Treatment: Previous biopsy, systemic therapy performed  (Gleevec)   SPECIMEN  Procedure: Resection (partial small bowel)    TUMOR  Tumor Focality:  Unifocal  Tumor Site: Jejunum  Tumor Size: 9.7 x 9.2 x 6.3 cm  Histologic Type: Gastrointestinal stromal tumor, spindle cell type  Mitotic Rate:  Specify mitotic rate per 5 mm squared: Less than 5 per 5  mm squared  Histologic Grade: G1, low grade  Necrosis: Not identified  Treatment Effect: Present, approximately 30% viable tumor remains  Risk Assessment: Moderate (24%) risk of progressive disease   MARGINS:  Margin status:  Margin(s) involved by GIST: Deep, see comment.   Comment:  Tumor is present on ink on the periphery of the tumor nodule.  The  nature of the specimen precludes accurate assessment of where the tumor  may have been attached in situ (apart from the small bowel attachment).  Proximal and distal bowel margins cannot be reliably assessed, given  that they were received detached from the tumor specimen.   REGIONAL LYMPH NODES  Regional lymph node status:  Regional lymph nodes present, all regional lymph nodes negative for  tumor.  Number of lymph nodes examined: Three    # CHRONIC ANEMIA EGD/capsule-WNL-2018 [Dr.vanga]-UGIB ; colonoscopy-2018 [Elliot] ; capsule-not done; hemoglobin around 8.8; iron saturation 5% [Dr.Tate].  Poor tolerance of p.o. 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   GIST (gastrointestinal stromal tumor) of small bowel, malignant (HCC)  03/12/2020 Initial Diagnosis   GIST (gastrointestinal stromal tumor) of small bowel, malignant (HCC)     HISTORY OF PRESENTING ILLNESS: Alone; ambulating independently. 6 L of oxygen nasal cannula.    Susan Barajas 78 y.o.  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   Patient says that she has no new questions or concerns today. Loose stools is improved.   Patient denies any worsening swelling of the legs.  She continues to wear compression stockings.  Intermittently on diuretics. Denies any nausea vomiting from the Gleevec.  Review of Systems  Constitutional:  Positive for malaise/fatigue. Negative for chills, diaphoresis, fever and weight loss.  HENT:  Negative for nosebleeds and sore throat.   Eyes:  Negative for double vision.  Respiratory:  Positive for shortness of breath. Negative for cough, hemoptysis, sputum production and wheezing.   Cardiovascular:  Negative for chest pain, palpitations, orthopnea and leg swelling.  Gastrointestinal:  Negative for abdominal pain, blood in stool, constipation, diarrhea, heartburn, melena, nausea and vomiting.  Genitourinary:  Negative for dysuria, frequency and urgency.  Musculoskeletal:  Negative for back pain and joint pain.  Skin: Negative.  Negative for itching and rash.  Neurological:  Negative for dizziness, tingling, focal weakness, weakness and headaches.  Endo/Heme/Allergies:  Does not bruise/bleed easily.  Psychiatric/Behavioral:  Negative for depression. The patient is not nervous/anxious and does not have insomnia.     MEDICAL HISTORY:  Past Medical History:  Diagnosis Date   Anemia    Asthma 1950   CHF (congestive heart failure) (HCC)    COPD (chronic obstructive pulmonary disease) (HCC)    Cor pulmonale (HCC)    DVT (deep venous thrombosis) (HCC) 2018   GERD (gastroesophageal reflux disease)    GIST (gastrointestinal stromal tumor) of small bowel, malignant (HCC)    Glaucoma 1949   Hyperlipidemia    Hypertension    Obesity, unspecified    OSA treated with BiPAP    Personal history of tobacco use, presenting  hazards to health    Pneumonia    Presence of IVC filter    Pulmonary HTN (HCC)    Supplemental oxygen dependent    T2DM (type 2 diabetes mellitus) (HCC)    Varicose veins of lower extremities with other complications     SURGICAL HISTORY: Past Surgical History:  Procedure Laterality Date   BREAST BIOPSY Right 1991   BREAST BIOPSY Left 2013   COLONOSCOPY  2010   Dr. Mechele Collin   COLONOSCOPY  Jan 2016   Dr Mechele Collin   ESOPHAGOGASTRODUODENOSCOPY N/A 11/26/2016   Procedure: ESOPHAGOGASTRODUODENOSCOPY (EGD);  Surgeon: Toney Reil, MD;  Location: [facility] ENDOSCOPY;  Service: Gastroenterology;  Laterality: N/A;   GIVENS CAPSULE STUDY N/A 11/26/2016   Procedure: GIVENS CAPSULE STUDY, if EGD negative, plan to drop capsule with scope if EGD negative;  Surgeon: Toney Reil, MD;  Location: [facility] ENDOSCOPY;  Service: Gastroenterology;  Laterality: N/A;   IVC FILTER INSERTION N/A 11/14/2016   Procedure: IVC Filter Insertion;  Surgeon: Renford Dills, MD;  Location: [facility] INVASIVE CV LAB;  Service: Cardiovascular;  Laterality: N/A;   PARTIAL COLECTOMY N/A 11/05/2020   Procedure: SMALL BOWEL RESECTION, REMOVAL OF GIST TUMOR AND LYMPH NODES, UMBILICAL HERNIA REPAIR;  Surgeon: Earline Mayotte, MD;  Location: [facility] ORS;  Service: General;  Laterality: N/A;  small bowel resection, RNFA to assist; TAPP block per anesthesia   RIGHT/LEFT HEART CATH AND CORONARY ANGIOGRAPHY N/A 06/30/2019   Procedure: RIGHT/LEFT HEART CATH AND CORONARY ANGIOGRAPHY;  Surgeon: Laurey Morale, MD;  Location: [facility] INVASIVE CV LAB;  Service: Cardiovascular;  Laterality: N/A;   VEIN SURGERY Right 2006   Vein Closure Procedure; RF ablation of right GSV    SOCIAL HISTORY: Social History   Socioeconomic History   Marital status: Single    Spouse name: Not on file   Number of children: 2   Years of education: Not on file   Highest education level: Not on file  Occupational History   Occupation: CELL    Employer:  RETIRED  Tobacco Use   Smoking status: Former    Current packs/day: 0.00    Average packs/day: 1.5 packs/day for 20.0 years (30.0 ttl pk-yrs)    Types: Cigarettes    Start date: 48    Quit date: 1999    Years since quitting: 26.1   Smokeless tobacco: Never  Vaping Use   Vaping status: Never Used  Substance and Sexual Activity   Alcohol use: No    Alcohol/week: 0.0 standard drinks of alcohol   Drug use: No   Sexual activity: Not Currently  Other Topics Concern   Not on file  Social History Narrative   Independent at baseline. Lives at home with her brother; in [place]. Quit smoking 20 years; no alcohol.    Social Drivers of Corporate investment banker Strain: Not on file  Food Insecurity: Not on file  Transportation Needs: Not on file  Physical Activity: Not on file  Stress: Not on file  Social Connections: Not on file  Intimate Partner Violence: Not on file    FAMILY HISTORY: Family History  Problem Relation Age of Onset   Prostate cancer Father    Other Sister        mouth cancer   Hypertension Mother     ALLERGIES:  has no known allergies.  MEDICATIONS:  Current Outpatient Medications  Medication Sig Dispense Refill   ACCU-CHEK AVIVA PLUS test strip 1 each 2 (two) times daily.     Accu-Chek Softclix Lancets lancets USE 1 TO CHECK GLUCOSE TWICE DAILY     acetaminophen (TYLENOL) 325 MG tablet Take 2 tablets (650 mg total) by mouth every 6 (six) hours as needed for mild pain (or Fever >/= 101).     aspirin 81 MG chewable tablet Chew 1 tablet (81 mg total) by mouth daily. 30 tablet 0   clobetasol cream (TEMOVATE) 0.05 % Apply 1 Application topically 3 (three) times a week.     dorzolamide-timolol (COSOPT) 2-0.5 % ophthalmic solution 1 drop 2 (two) times daily.     FARXIGA 10 MG TABS tablet Take 1 tablet by mouth once daily 90 tablet 3   ferrous gluconate (FERGON) 325 MG tablet Take 325 mg by mouth every other day.     furosemide (LASIX) 40 MG tablet Take 40  mg by mouth daily.     imatinib (GLEEVEC) 100 MG tablet TAKE 4 TABLETS (400 MG TOTAL) BY MOUTH DAILY. TAKE WITH MEALS AND LARGE GLASS OF WATER 120 tablet 2   insulin glargine (LANTUS) 100 UNIT/ML injection Inject 32 Units into the skin in the morning.     latanoprost (XALATAN) 0.005 % ophthalmic solution Place 1 drop into both eyes at bedtime.      loperamide (IMODIUM) 2 MG capsule Take by mouth as needed for diarrhea  or loose stools.     lovastatin (MEVACOR) 20 MG tablet Take 20 mg by mouth every evening.      metFORMIN (GLUCOPHAGE) 1000 MG tablet Take 1,000 mg by mouth daily.      Multiple Vitamin (MULTIVITAMIN) tablet Take 1 tablet by mouth daily.     omeprazole (PRILOSEC) 40 MG capsule Take 40 mg by mouth daily.     OPSUMIT 10 MG tablet TAKE 1 TABLET DAILY. PLEASE SCHEDULE APPOINTMENT FOR FUTURE REFILLS 30 tablet 0   potassium chloride SA (KLOR-CON) 20 MEQ tablet Take 1 tablet (20 mEq total) by mouth daily. 90 tablet 3   tadalafil, PAH, (ADCIRCA) 20 MG tablet TAKE 2 TABLETS DAILY 60 tablet 11   timolol (TIMOPTIC) 0.5 % ophthalmic solution Place 1 drop into both eyes 2 (two) times daily.      Tiotropium Bromide-Olodaterol (STIOLTO RESPIMAT) 2.5-2.5 MCG/ACT AERS INHALE 2 PUFFS BY MOUTH ONCE DAILY 4 g 11   No current facility-administered medications for this visit.      PHYSICAL EXAMINATION:   Vitals:   06/15/23 1433  BP: (!) 125/43  Pulse: 61  Resp: 20  Temp: 98 F (36.7 C)  SpO2: 95%    Filed Weights   06/15/23 1433  Weight: 212 lb 9.6 oz (96.4 kg)     Physical Exam HENT:     Head: Normocephalic and atraumatic.     Mouth/Throat:     Pharynx: No oropharyngeal exudate.  Eyes:     Pupils: Pupils are equal, round, and reactive to light.  Cardiovascular:     Rate and Rhythm: Normal rate and regular rhythm.  Pulmonary:     Effort: No respiratory distress.     Breath sounds: No wheezing.     Comments: Decreased breath sounds. Abdominal:     General: Bowel sounds are  normal. There is no distension.     Palpations: Abdomen is soft. There is no mass.     Tenderness: There is no abdominal tenderness. There is no guarding or rebound.  Musculoskeletal:        General: No tenderness. Normal range of motion.     Cervical back: Normal range of motion and neck supple.  Skin:    General: Skin is warm.  Neurological:     Mental Status: She is alert and oriented to person, place, and time.  Psychiatric:        Mood and Affect: Affect normal.     LABORATORY DATA:  I have reviewed the data as listed Lab Results  Component Value Date   WBC 6.6 06/15/2023   HGB 11.4 (L) 06/15/2023   HCT 35.4 (L) 06/15/2023   MCV 95.2 06/15/2023   PLT 230 06/15/2023   Recent Labs    12/10/22 1302 03/17/23 1423 06/15/23 1405  NA 135 136 138  K 4.2 4.1 4.0  CL 103 102 103  CO2 24 23 26   GLUCOSE 130* 170* 88  BUN 23 22 27*  CREATININE 1.31* 1.14* 1.21*  CALCIUM 8.9 8.7* 9.1  GFRNONAA 42* 50* 46*  PROT 7.2 7.0 7.2  ALBUMIN 3.9 3.8 3.9  AST 20 23 20   ALT 15 16 18   ALKPHOS 46 43 44  BILITOT 0.3 0.5 0.5     CT ABDOMEN PELVIS WO CONTRAST Result Date: 06/13/2023 CLINICAL DATA:  History of GIST tumor of the small bowel. EXAM: CT ABDOMEN AND PELVIS WITHOUT CONTRAST TECHNIQUE: Multidetector CT imaging of the abdomen and pelvis was performed following the standard protocol without IV contrast.  RADIATION DOSE REDUCTION: This exam was performed according to the departmental dose-optimization program which includes automated exposure control, adjustment of the mA and/or kV according to patient size and/or use of iterative reconstruction technique. COMPARISON:  Multiple prior imaging studies. The most recent CT scan is 11/24/2022 and the most recent PET-CT is June 2022 FINDINGS: Lower chest: No acute pulmonary findings or worrisome pulmonary lesions or nodules. Stable aortic and coronary artery calcifications. No pericardial effusion. Hepatobiliary: No focal liver abnormality is  seen. No gallstones, gallbladder wall thickening, or biliary dilatation. Pancreas: Unremarkable. No pancreatic ductal dilatation or surrounding inflammatory changes. Spleen: Normal in size without focal abnormality. Adrenals/Urinary Tract: The adrenal glands and kidneys are unremarkable. No worrisome lesions. Small left renal calculus is stable. The bladder is unremarkable. Stomach/Bowel: The stomach, duodenum and small are unremarkable. No findings for recurrent tumor, acute inflammatory process or obstructive findings. Significant descending and sigmoid colon diverticulosis without findings for acute diverticulitis. The terminal ileum and appendix are. Small metallic object in the right lower quadrant possibly in or adjacent to the cecum likely some type of surgical clip although do not see it on prior CT scan. Vascular/Lymphatic: Stable advanced atherosclerotic calcifications involving the aorta and iliac arteries. No aneurysm. Stable IVC filter. Persistent mesenteric and retroperitoneal lymph nodes. The largest in the small bowel mesentery on image 53/2 measures 12 mm and previously measured 14 mm. Smaller adjacent node to the left of midline on image 49/2 measures 9 mm and previously measured 10 mm. Small clustered left-sided retroperitoneal lymph nodes are unchanged. No new or progressive findings. Reproductive: Stable enlarged fibroid uterus.  No adnexal mass. Other: No abdominal wall hernia or abnormality. No abdominopelvic ascites. Musculoskeletal: No significant bony findings. IMPRESSION: 1. No findings for recurrent tumor, acute inflammatory process or obstructive findings. 2. Persistent mesenteric and retroperitoneal lymph nodes, similar to the prior study. No new or progressive findings. 3. Stable enlarged fibroid uterus. 4. Stable small left renal calculus. 5. Stable advanced atherosclerotic calcifications involving the aorta and iliac arteries. 6. Stable IVC filter. Electronically Signed   By: Rudie Meyer M.D.   On: 06/13/2023 09:20     GIST (gastrointestinal stromal tumor) of small bowel, malignant (HCC) # NOV 2021-GIST of small bowel-low grade- high risk [ non-gastric primary GIST >=5 cm.] STAGE II [T3N0]-s/p neoadjuvant therapy with Gleevec x6 months [November-end of June 2022]-s/p resection ~30% viable tumor noted post resection specimen.  Currently on adjuvant Gleevec [3-5 years].  No evidence of recurrence.    # FEB 2025- - CT AP-  No findings for recurrent tumor, acute inflammatory process or obstructive findings. Persistent mesenteric and retroperitoneal lymph nodes, similar to the prior study. No new or progressive findings.  # At length I discussed re: stopping now [3 years] vs continuing for 5 years-.  Since patient is high risk GIST-I would recommend 5 years especially as patient is tolerating Gleevec without any major side effects.  However if she does develop any side effects we will discontinue Gleevec  # Tolerating Gleevec well except for mild side effects [diarrhea]; swelling in the feet [see below]-stable.   # Swelling in the feet-grade 1 Continue compression stockings/diuretics as needed.  stable.   #CHF [Dr.McClean, [facility]-[place]]/COPD home O2 4-6Lits;-borderline respiratory status- stable.   # CKD stage III-[diuretics]  stable.   # reflex- on PPI- stable.   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  Today hemoglobin is   11.8. Discussed with pt- FEB 2024-- I sat-18%;Ferritin-32. Continue  PO iron one pill every other day-  HOLD venofer today-  stable.   # Myalgias/arthralgias question Gleevec versus others- recommend ca+vit D; tylenol as needed-stable   #Incidental findings on Imaging  CT , 2025: Stable enlarged fibroid uterus; Stable small left renal calculus; Stable advanced atherosclerotic calcifications involving the aorta and iliac arteries; Stable IVC filterI reviewed/discussed/counseled the patient.   # DISPOSITION:.   # HOLD  venofer today # Follow up in 3 month-  MD; labs-cbc/cmp;iron studies/ferritin; possible venofer;   Dr.B     All questions were answered. The patient knows to call the clinic with any problems, questions or concerns.    Earna Coder, MD 06/15/2023 3:20 PM

## 2023-06-17 ENCOUNTER — Other Ambulatory Visit: Payer: Self-pay

## 2023-06-22 ENCOUNTER — Other Ambulatory Visit (HOSPITAL_COMMUNITY): Payer: Self-pay | Admitting: Cardiology

## 2023-06-23 ENCOUNTER — Other Ambulatory Visit: Payer: Self-pay

## 2023-06-23 ENCOUNTER — Other Ambulatory Visit (HOSPITAL_COMMUNITY): Payer: Self-pay

## 2023-06-23 NOTE — Progress Notes (Signed)
 Specialty Pharmacy Refill Coordination Note  Susan Barajas is a 78 y.o. female contacted today regarding refills of specialty medication(s) Imatinib Mesylate (GLEEVEC)   Patient requested Delivery   Delivery date: 06/26/23   Verified address: [address]   [place] [address]   Medication will be filled on 03.13.25.

## 2023-07-17 ENCOUNTER — Other Ambulatory Visit (HOSPITAL_COMMUNITY): Payer: Self-pay | Admitting: Cardiology

## 2023-07-17 DIAGNOSIS — I272 Pulmonary hypertension, unspecified: Secondary | ICD-10-CM

## 2023-07-20 ENCOUNTER — Other Ambulatory Visit (HOSPITAL_COMMUNITY): Payer: Self-pay | Admitting: Cardiology

## 2023-07-21 ENCOUNTER — Other Ambulatory Visit: Payer: Self-pay

## 2023-07-21 NOTE — Progress Notes (Signed)
 Specialty Pharmacy Refill Coordination Note  [place] Anna Bakula is a 78 y.o. female contacted today regarding refills of specialty medication(s) Imatinib Mesylate (GLEEVEC)   Patient requested Delivery   Delivery date: 07/27/23   Verified address: [address]   [place] [address]   Medication will be filled on 07/24/23.

## 2023-08-13 ENCOUNTER — Telehealth: Payer: Self-pay | Admitting: Cardiology

## 2023-08-13 NOTE — Telephone Encounter (Signed)
 Called to confirm/remind patient of their appointment at the [facility] on 08/14/23.   Appointment:   [x] Confirmed  [] Left mess   [] No answer/No voice mail  [] VM Full/unable to leave message  [] Phone not in service  Patient reminded to bring all medications and/or complete list.  Confirmed patient has transportation. Gave directions, instructed to utilize valet parking.

## 2023-08-14 ENCOUNTER — Ambulatory Visit: Attending: Cardiology | Admitting: Cardiology

## 2023-08-14 VITALS — BP 116/44 | HR 65 | Wt 216.0 lb

## 2023-08-14 DIAGNOSIS — Z7984 Long term (current) use of oral hypoglycemic drugs: Secondary | ICD-10-CM | POA: Insufficient documentation

## 2023-08-14 DIAGNOSIS — J439 Emphysema, unspecified: Secondary | ICD-10-CM | POA: Diagnosis not present

## 2023-08-14 DIAGNOSIS — I5032 Chronic diastolic (congestive) heart failure: Secondary | ICD-10-CM | POA: Insufficient documentation

## 2023-08-14 DIAGNOSIS — Z87891 Personal history of nicotine dependence: Secondary | ICD-10-CM | POA: Diagnosis not present

## 2023-08-14 DIAGNOSIS — Z794 Long term (current) use of insulin: Secondary | ICD-10-CM | POA: Insufficient documentation

## 2023-08-14 DIAGNOSIS — I2510 Atherosclerotic heart disease of native coronary artery without angina pectoris: Secondary | ICD-10-CM | POA: Diagnosis not present

## 2023-08-14 DIAGNOSIS — Z9981 Dependence on supplemental oxygen: Secondary | ICD-10-CM | POA: Insufficient documentation

## 2023-08-14 DIAGNOSIS — Z79899 Other long term (current) drug therapy: Secondary | ICD-10-CM | POA: Diagnosis not present

## 2023-08-14 DIAGNOSIS — E119 Type 2 diabetes mellitus without complications: Secondary | ICD-10-CM | POA: Insufficient documentation

## 2023-08-14 DIAGNOSIS — Z86718 Personal history of other venous thrombosis and embolism: Secondary | ICD-10-CM | POA: Insufficient documentation

## 2023-08-14 DIAGNOSIS — I272 Pulmonary hypertension, unspecified: Secondary | ICD-10-CM | POA: Insufficient documentation

## 2023-08-14 DIAGNOSIS — G4733 Obstructive sleep apnea (adult) (pediatric): Secondary | ICD-10-CM | POA: Diagnosis not present

## 2023-08-14 DIAGNOSIS — J9611 Chronic respiratory failure with hypoxia: Secondary | ICD-10-CM | POA: Insufficient documentation

## 2023-08-14 DIAGNOSIS — I110 Hypertensive heart disease with heart failure: Secondary | ICD-10-CM | POA: Insufficient documentation

## 2023-08-14 MED ORDER — POTASSIUM CHLORIDE CRYS ER 20 MEQ PO TBCR: 40.0000 meq | EXTENDED_RELEASE_TABLET | Freq: Every day | ORAL | 3 refills | Status: AC

## 2023-08-14 MED ORDER — FUROSEMIDE 40 MG PO TABS: ORAL_TABLET | ORAL | 3 refills | Status: DC

## 2023-08-14 NOTE — Patient Instructions (Signed)
 Medication Changes:  INCREASE Furosemide  40mg  (2 tabs) every morning AND 20mg  (1 tab) every evening.  INCREASE Potassium 40meq (2 tabs) daily  Lab Work:  Go DOWN to LOWER LEVEL (LL) to have your blood work completed inside of [employer] office TODAY.  Go over to the MEDICAL MALL. Go pass the gift shop and have your blood work completed in 10-14 days.  We will only call you if the results are abnormal or if the provider would like to make medication changes.   Testing/Procedures:  Your physician has requested that you have an echocardiogram. Echocardiography is a painless test that uses sound waves to create images of your heart. It provides your doctor with information about the size and shape of your heart and how well your heart's chambers and valves are working. This procedure takes approximately one hour. There are no restrictions for this procedure. Please do NOT wear cologne, perfume, aftershave, or lotions (deodorant is allowed). Please arrive 15 minutes prior to your appointment time.  Please note: We ask at that you not bring children with you during ultrasound (echo/ vascular) testing. Due to room size and safety concerns, children are not allowed in the ultrasound rooms during exams. Our front office staff cannot provide observation of children in our lobby area while testing is being conducted. An adult accompanying a patient to their appointment will only be allowed in the ultrasound room at the discretion of the ultrasound technician under special circumstances. We apologize for any inconvenience.  Please have your echo completed. You will check in for this at the [facility]ICAL [facility]ALL. You have to arrive 15 MINS EARLY for preparation, otherwise you will have to reschedule.   Follow-Up in: Please follow up with the [facility] in 3 months with Dr. Mitzie Anda. We currently do not have that schedule. Please give us  a call in July in order to schedule your appointment for  August.   At the [facility], you and your health needs are our priority. We have a designated team specialized in the treatment of Heart Failure. This Care Team includes your primary Heart Failure Specialized Cardiologist (physician), Advanced Practice Providers (APPs- Physician Assistants and Nurse Practitioners), and Pharmacist who all work together to provide you with the care you need, when you need it.   You may see any of the following providers on your designated Care Team at your next follow up:  Dr. Jules Oar Dr. Peder Bourdon Dr. Alwin Baars Dr. Judyth Nunnery Shawnee Dellen, FNP Bevely Brush, RPH-CPP  Please be sure to bring in all your medications bottles to every appointment.   Need to Contact Us :  If you have any questions or concerns before your next appointment please send us  a message through [place] or call our office at [phone].    TO LEAVE A MESSAGE FOR THE NURSE SELECT OPTION 2, PLEASE LEAVE A MESSAGE INCLUDING: YOUR NAME DATE OF BIRTH CALL BACK NUMBER REASON FOR CALL**this is important as we prioritize the call backs  YOU WILL RECEIVE A CALL BACK THE SAME DAY AS LONG AS YOU CALL BEFORE 4:00 PM

## 2023-08-15 LAB — BASIC METABOLIC PANEL WITH GFR
BUN/Creatinine Ratio: 15 (ref 12–28)
BUN: 17 mg/dL (ref 8–27)
CO2: 21 mmol/L (ref 20–29)
Calcium: 9.1 mg/dL (ref 8.7–10.3)
Chloride: 104 mmol/L (ref 96–106)
Creatinine, Ser: 1.14 mg/dL — ABNORMAL HIGH (ref 0.57–1.00)
Glucose: 56 mg/dL — ABNORMAL LOW (ref 70–99)
Potassium: 4.7 mmol/L (ref 3.5–5.2)
Sodium: 137 mmol/L (ref 134–144)
eGFR: 49 mL/min/{1.73_m2} — ABNORMAL LOW (ref >59)

## 2023-08-15 LAB — BRAIN NATRIURETIC PEPTIDE: BNP: 57.6 pg/mL (ref 0.0–100.0)

## 2023-08-16 NOTE — Progress Notes (Signed)
 PCP: Westley Hammers, MD Cardiology: Dr. Alvenia Aus HF Cardiology: Dr. Mitzie Anda  Chief complaint: Pulmonary hypertension  78 y.o. with history of chronic hypoxemic respiratory failure, OSA, and chronic diastolic CHF was referred by Dr. Alvenia Aus for evaluation of CHF and pulmonary hypertension.  Patient has been on home oxygen for 2-3 years per her report.  She was a smoker remotely. She had a DVT in 2018.  Anticoagulation was stopped due to refractory anemia and IVC filter was placed.  CT chest did not show a PE. She has OSA and uses CPAP.  Last echo in 1/21 showed normal LV EF with PASP 80 and severely dilated RV with mild RV dysfunction and D-shaped septum. She has been on Lasix  for diuresis due to RV failure.    RHC/LHC was done in 3/21, showing mild CAD; mean RA 12, PA 68/25 mean 42, mean PCWP 9, CI 4.1, PVR 4.13 WU.  V/Q scan in 3/21 did not show evidence for chronic PE.  High resolution chest CT in 3/21 did not show evidence for ILD, it did show mild-moderate emphysema. PFTs (3/21) showed moderate-severe COPD.   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   She was unable to tolerate Tyvaso due to intractable nausea and vomiting.   Echo 5/22 showed EF 60-65%, low normal RV function, mild RV enlargement, PASP 44 mmHg.  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   Echo in 8/23 showed EF 55-60%, RV mildly enlarged with normal systolic function, moderate LAE,  normal IVC, PASP 35 mmHg.   Echo in 7/24 showed EF 60-65%, normal RV, PASP 48 mmHg, mild MR.   She returns for followup of pulmonary hypertension and RV failure.  Wearing home oxygen, 4L at rest and 6L with exertion, uses Bipap at night. Weight is down 1 lb.  Stable breathing, dyspnea with moderate activity.  No orthopnea/PND.  No chest pain.  No lightheadedness.    ECG (personally reviewed): NSR, poor RWP  6 minute walk (3/21): 182 m 6 minute walk (5/21): 243 m, oxygen saturation dropped to as low as 70s 6  minute walk (7/21): Only walked 3 min bc oxygen saturation dropped to 70s and CMA stopped walk, she went 121 m.  6 minute walk (3/22): 298 m 6 minute walk (3/23): 366 m with her home oxygen.  6 minute walk (4/24): 427 m with home oxygen  Labs (12/20): BNP 819 Labs (2/21): K 4.4, creatinine 0.72 Labs (3/21): RF normal, ANA negative, anti-Jo-1 negative, anti-SCL-70 negative Labs (5/21): K 4.3, creatinine 0.88 Labs (6/21): hgb 10 Labs (3/22): BNP 89 Labs (4/22): K 3.9, creatinine 0.93 Labs (8/22): K 3.5, creatinine 0.72 Labs (10/22): K 4.8, creatinine 0.77, hgb 11.1 Labs (1/23): K 4.2, creatinine 0.86 Labs (4/23): K 4.2, creatinine 0.93, hgb 12 Labs (2/24): K 3.7, creatinine 1.12 Labs (4/24): BNP 98, LDL 63 Labs (5/24): K 4.1, creatinine 1.17 Labs (3/25): K 4, creatinine 1.21  PMH: 1. COVID-19 PNA 8/20.  2. Type 2 diabetes 3. DVT: right leg, 2018.  - Has IVC filter, not anticoagulated due to h/o anemia.  4. HTN 5. Hyperlipidemia.  6. OSA: Uses Bipap.  7. COPD: Chronic hypoxemic respiratory failure, 3 L home oxygen.  8. Hyponatremia on HCTZ 9. Chronic diastolic CHF:  - Lexiscan  Cardiolite in 2/19 was low risk no ischemia.  - Echo (1/21): EF 65-70%, no LVH, trivial MR, PASP 80 mmHg, severe RV enlargement with mildly decreased RV systolic function, D-shaped interventricular septum, dilated IVC.  -  CTA chest (8/18): No evidence for PE.  10. Pulmonary hypertension: RHC/LHC 3/21 with mild CAD; mean RA 12, PA 68/25 mean 42, mean PCWP 9, CI 4.1, PVR 4.13 WU.  - Serologic workup negative.  - V/Q scan (3/21): No chronic PE.  - HRCT (3/21): No ILD, mild-moderate COPD. - PFTs (3/21): moderate-severe COPD - Echo (5/22): EF 60-65%, low normal RV function, mild RV enlargement, PASP 44 mmHg - Echo (8/23): EF 55-60%, RV mildly enlarged with normal systolic function, moderate LAE,  normal IVC, PASP 35 mmHg.  - Echo (7/24): EF 60-65%, normal RV, PASP 48 mmHg, mild MR.  11. GI stromal tumor:  Treated with Gleevec .  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  Social History   Socioeconomic History   Marital status: Single    Spouse name: Not on file   Number of children: 2   Years of education: Not on file   Highest education level: Not on file  Occupational History   Occupation: CELL    Employer: RETIRED  Tobacco Use   Smoking status: Former    Current packs/day: 0.00    Average packs/day: 1.5 packs/day for 20.0 years (30.0 ttl pk-yrs)    Types: Cigarettes    Start date: 60    Quit date: 1999    Years since quitting: 26.3   Smokeless tobacco: Never  Vaping Use   Vaping status: Never Used  Substance and Sexual Activity   Alcohol use: No    Alcohol/week: 0.0 standard drinks of alcohol   Drug use: No   Sexual activity: Not Currently  Other Topics Concern   Not on file  Social History Narrative   Independent at baseline. Lives at home with her brother; in [place]. Quit smoking 20 years; no alcohol.    Social Drivers of Corporate investment banker Strain: Not on file  Food Insecurity: Not on file  Transportation Needs: Not on file  Physical Activity: Not on file  Stress: Not on file  Social Connections: Not on file  Intimate Partner Violence: Not on file   Family History  Problem Relation Age of Onset   Prostate cancer Father    Other Sister        mouth cancer   Hypertension Mother    ROS: All systems reviewed and negative except as per HPI.   Current Outpatient Medications  Medication Sig Dispense Refill   ACCU-CHEK AVIVA PLUS test strip 1 each 2 (two) times daily.     Accu-Chek Softclix Lancets lancets USE 1 TO CHECK GLUCOSE TWICE DAILY     acetaminophen  (TYLENOL ) 325 MG tablet Take 2 tablets (650 mg total) by mouth every 6 (six) hours as needed for mild pain (or Fever >/= 101).     aspirin  81 MG chewable tablet Chew 1 tablet (81 mg total) by mouth daily. 30 tablet 0   clobetasol  cream (TEMOVATE ) 0.05 % Apply 1 Application topically 3 (three) times a  week.     dorzolamide-timolol  (COSOPT) 2-0.5 % ophthalmic solution 1 drop 2 (two) times daily.     FARXIGA  10 MG TABS tablet Take 1 tablet by mouth once daily 90 tablet 3   ferrous gluconate  (FERGON) 325 MG tablet Take 325 mg by mouth every other day.     imatinib  (GLEEVEC ) 100 MG tablet TAKE 4 TABLETS (400 MG TOTAL) BY MOUTH DAILY. TAKE WITH MEALS AND LARGE GLASS OF WATER 120 tablet 2   insulin  glargine (LANTUS ) 100 UNIT/ML injection Inject 32 Units into the skin in  the morning.     latanoprost  (XALATAN ) 0.005 % ophthalmic solution Place 1 drop into both eyes at bedtime.      loperamide (IMODIUM) 2 MG capsule Take by mouth as needed for diarrhea or loose stools.     lovastatin (MEVACOR) 20 MG tablet Take 20 mg by mouth every evening.      metFORMIN  (GLUCOPHAGE ) 1000 MG tablet Take 1,000 mg by mouth daily.      Multiple Vitamin (MULTIVITAMIN) tablet Take 1 tablet by mouth daily.     omeprazole  (PRILOSEC) 40 MG capsule Take 40 mg by mouth daily.     OPSUMIT  10 MG tablet TAKE 1 TABLET DAILY. PLEASE SCHEDULE APPOINTMENT FOR FUTURE REFILLS 30 tablet 0   tadalafil , PAH, (ADCIRCA ) 20 MG tablet TAKE 2 TABLETS DAILY 60 tablet 3   timolol  (TIMOPTIC ) 0.5 % ophthalmic solution Place 1 drop into both eyes 2 (two) times daily.      Tiotropium Bromide-Olodaterol (STIOLTO RESPIMAT ) 2.5-2.5 MCG/ACT AERS INHALE 2 PUFFS BY MOUTH ONCE DAILY 4 g 11   furosemide  (LASIX ) 40 MG tablet Take 1 tablet (40 mg total) by mouth every morning AND 0.5 tablets (20 mg total) every evening. 270 tablet 3   potassium chloride  SA (KLOR-CON  M) 20 MEQ tablet Take 2 tablets (40 mEq total) by mouth daily. 180 tablet 3   No current facility-administered medications for this visit.   BP (!) 116/44   Pulse 65   Wt 216 lb (98 kg)   SpO2 94% Comment: 6 L  BMI 35.94 kg/m  General: NAD Neck: JVP 8-9 cm with HJR, no thyromegaly or thyroid nodule.  Lungs: Clear to auscultation bilaterally with normal respiratory effort. CV: Nondisplaced  PMI.  Heart regular S1/S2, no S3/S4, no murmur.  1+ ankle edema.  No carotid bruit.  Normal pedal pulses.  Abdomen: Soft, nontender, no hepatosplenomegaly, no distention.  Skin: Intact without lesions or rashes.  Neurologic: Alert and oriented x 3.  Psych: Normal affect. Extremities: No clubbing or cyanosis.  HEENT: Normal.  Assessment/Plan: 1. RV failure: Echo in 1/21 showed significant RV dysfunction with severe pulmonary hypertension and D-shaped interventricular septum.  Based on RHC, it looks like RV failure is due to pulmonary arterial hypertension rather than LV failure (normal PCWP).  Repeated echo in 5/22 showed EF 60-65%, low normal RV function, mild RV enlargement, PASP 44 mmHg. Echo in 8/23 showed EF 55-60%, RV mildly enlarged with normal systolic function, moderate LAE, normal IVC, PASP 35 mmHg.  Echo in 7/24 showed EF 60-65%, normal RV, PASP 48 mmHg, mild MR. NYHA class II-III, she does look volume overloaded on exam.  - Increase Lasix  to 40 qam/20 qpm and increase KCl to 40 daily.  BMET/BNP today and BMET in 10 days.   - Continue Farxiga .  2. Pulmonary HTN: Echo in 1/21 with RV failure and estimate PA systolic pressure 80 mmHg.  RHC in 3/21 showed pulmonary arterial hypertension.  Serologic workup was negative.  V/Q scan showed no chronic PE.  HRCT showed no ILD, mild-moderate emphysema.  PFTs suggestive of moderate COPD. Pulmonary hypertension seems out of proportion to parenchymal disease, possible significant group 1 component to her PH in addition to group 3 (COPD).  I reviewed echo from 7/24, RV looked normal with mild pulmonary hypertension, PASP 48 mmHg. She has been symptomatically improved with selective pulmonary vasodilators.  She was unable to tolerate Tyvaso.  - We have not set up 6 minute walk course at this office yet, will need to do  walk next appointment.  - Continue to use Bipap at night, oxygen during the day.  - Continue Opsumit  10 mg daily.  - Continue tadalafil  40  mg daily.  - Will not add additional PH meds at this time with mixed picture, probably predominantly group 3 PH (COPD).   - I will arrange for echo to follow RV.  3. H/o DVT: 2018.  She has IVC filter and is not anticoagulated due to h/o anemia.  4. Type 2 diabetes: She is on Farxiga .  5. Chronic hypoxemic respiratory failure: Home oxygen.  Moderate COPD on PFTs, mild-moderate emphysema on HRCT.  Suspect pulmonary arterial hypertension also plays a role.  - Continue home oxygen.  Followup in 3 months.     I spent 31 minutes reviewing records, interviewing/examining patient, and managing orders.   Peder Bourdon 08/16/2023

## 2023-08-18 ENCOUNTER — Other Ambulatory Visit (HOSPITAL_COMMUNITY): Payer: Self-pay | Admitting: Cardiology

## 2023-08-18 ENCOUNTER — Other Ambulatory Visit: Payer: Self-pay

## 2023-08-20 ENCOUNTER — Other Ambulatory Visit: Payer: Self-pay | Admitting: Internal Medicine

## 2023-08-20 ENCOUNTER — Other Ambulatory Visit: Payer: Self-pay

## 2023-08-20 ENCOUNTER — Other Ambulatory Visit (HOSPITAL_COMMUNITY): Payer: Self-pay

## 2023-08-20 DIAGNOSIS — C49A3 Gastrointestinal stromal tumor of small intestine: Secondary | ICD-10-CM

## 2023-08-20 NOTE — Progress Notes (Signed)
 Specialty Pharmacy Refill Coordination Note  Susan  Brandyce Barajas is a 78 y.o. female contacted today regarding refills of specialty medication(s) Imatinib  Mesylate (GLEEVEC )   Patient requested Delivery   Delivery date: 08/24/23   Verified address: [address]   [facility] [id number]   Medication will be filled on 08/21/23.    This fill date is pending response to refill request from provider. Patient is aware and if they have not received fill by intended date they must follow up with pharmacy.

## 2023-08-20 NOTE — Progress Notes (Signed)
 Specialty Pharmacy Ongoing Clinical Assessment Note  Susan  Terrian Barajas is a 78 y.o. female who is being followed by the specialty pharmacy service for RxSp Oncology   Patient's specialty medication(s) reviewed today: Imatinib  Mesylate (GLEEVEC )   Missed doses in the last 4 weeks: 0   Patient/Caregiver did not have any additional questions or concerns.   Therapeutic benefit summary: Patient is achieving benefit   Adverse events/side effects summary: Experienced adverse events/side effects (hair thinning, tolerable)   Patient's therapy is appropriate to: Continue    Goals Addressed             This Visit's Progress    Slow Disease Progression   On track    Patient is on track. Patient will maintain adherence.  CT from 2/25 showed no finding for recurrent tumor.          Follow up: 6 months  Malachi Screws Specialty Pharmacist

## 2023-08-21 ENCOUNTER — Other Ambulatory Visit: Payer: Self-pay

## 2023-08-21 MED ORDER — IMATINIB MESYLATE 100 MG PO TABS
ORAL_TABLET | ORAL | 2 refills | Status: DC
  Filled 2023-08-21: qty 120, 30d supply, fill #0
  Filled 2023-09-08: qty 120, 30d supply, fill #1
  Filled 2023-10-13: qty 120, 30d supply, fill #2

## 2023-08-28 ENCOUNTER — Other Ambulatory Visit
Admission: RE | Admit: 2023-08-28 | Discharge: 2023-08-28 | Disposition: A | Discharge status: Home / Self Care | Attending: Cardiology | Admitting: Cardiology

## 2023-08-28 ENCOUNTER — Ambulatory Visit (HOSPITAL_COMMUNITY): Payer: Self-pay | Admitting: Cardiology

## 2023-08-28 DIAGNOSIS — I272 Pulmonary hypertension, unspecified: Secondary | ICD-10-CM | POA: Insufficient documentation

## 2023-08-28 LAB — BASIC METABOLIC PANEL WITH GFR
Anion gap: 9 (ref 5–15)
BUN: 25 mg/dL — ABNORMAL HIGH (ref 8–23)
CO2: 22 mmol/L (ref 22–32)
Calcium: 9 mg/dL (ref 8.9–10.3)
Chloride: 103 mmol/L (ref 98–111)
Creatinine, Ser: 1.28 mg/dL — ABNORMAL HIGH (ref 0.44–1.00)
GFR, Estimated: 43 mL/min — ABNORMAL LOW (ref >60)
Glucose, Bld: 196 mg/dL — ABNORMAL HIGH (ref 70–99)
Potassium: 4.4 mmol/L (ref 3.5–5.1)
Sodium: 134 mmol/L — ABNORMAL LOW (ref 135–145)

## 2023-09-08 ENCOUNTER — Other Ambulatory Visit: Payer: Self-pay

## 2023-09-08 ENCOUNTER — Other Ambulatory Visit (HOSPITAL_COMMUNITY): Payer: Self-pay

## 2023-09-08 NOTE — Progress Notes (Signed)
 Specialty Pharmacy Refill Coordination Note  Susan  Branae Barajas is a 78 y.o. female contacted today regarding refills of specialty medication(s) Imatinib  Mesylate (GLEEVEC )   Patient requested Delivery   Delivery date: 09/18/23   Verified address: [address]  [facility] [id number]   Medication will be filled on 09/17/23.

## 2023-09-15 ENCOUNTER — Inpatient Hospital Stay

## 2023-09-15 ENCOUNTER — Inpatient Hospital Stay: Attending: Internal Medicine

## 2023-09-15 ENCOUNTER — Inpatient Hospital Stay (HOSPITAL_BASED_OUTPATIENT_CLINIC_OR_DEPARTMENT_OTHER): Admitting: Internal Medicine

## 2023-09-15 ENCOUNTER — Encounter: Payer: Self-pay | Admitting: Internal Medicine

## 2023-09-15 VITALS — BP 120/43 | HR 88 | Temp 100.0°F | Resp 16 | Wt 209.0 lb

## 2023-09-15 DIAGNOSIS — C49A3 Gastrointestinal stromal tumor of small intestine: Secondary | ICD-10-CM

## 2023-09-15 DIAGNOSIS — D509 Iron deficiency anemia, unspecified: Secondary | ICD-10-CM | POA: Insufficient documentation

## 2023-09-15 DIAGNOSIS — I509 Heart failure, unspecified: Secondary | ICD-10-CM | POA: Diagnosis not present

## 2023-09-15 DIAGNOSIS — R609 Edema, unspecified: Secondary | ICD-10-CM | POA: Diagnosis not present

## 2023-09-15 DIAGNOSIS — N183 Chronic kidney disease, stage 3 unspecified: Secondary | ICD-10-CM | POA: Diagnosis not present

## 2023-09-15 DIAGNOSIS — J449 Chronic obstructive pulmonary disease, unspecified: Secondary | ICD-10-CM | POA: Insufficient documentation

## 2023-09-15 DIAGNOSIS — Z87891 Personal history of nicotine dependence: Secondary | ICD-10-CM | POA: Insufficient documentation

## 2023-09-15 LAB — CBC WITH DIFFERENTIAL (CANCER CENTER ONLY)
Abs Immature Granulocytes: 0.01 10*3/uL (ref 0.00–0.07)
Basophils Absolute: 0 10*3/uL (ref 0.0–0.1)
Basophils Relative: 1 %
Eosinophils Absolute: 0.1 10*3/uL (ref 0.0–0.5)
Eosinophils Relative: 1 %
HCT: 35.3 % — ABNORMAL LOW (ref 36.0–46.0)
Hemoglobin: 11.3 g/dL — ABNORMAL LOW (ref 12.0–15.0)
Immature Granulocytes: 0 %
Lymphocytes Relative: 15 %
Lymphs Abs: 0.9 10*3/uL (ref 0.7–4.0)
MCH: 30.2 pg (ref 26.0–34.0)
MCHC: 32 g/dL (ref 30.0–36.0)
MCV: 94.4 fL (ref 80.0–100.0)
Monocytes Absolute: 0.5 10*3/uL (ref 0.1–1.0)
Monocytes Relative: 8 %
Neutro Abs: 4.5 10*3/uL (ref 1.7–7.7)
Neutrophils Relative %: 75 %
Platelet Count: 224 10*3/uL (ref 150–400)
RBC: 3.74 MIL/uL — ABNORMAL LOW (ref 3.87–5.11)
RDW: 14 % (ref 11.5–15.5)
WBC Count: 6 10*3/uL (ref 4.0–10.5)
nRBC: 0 % (ref 0.0–0.2)

## 2023-09-15 LAB — IRON AND TIBC
Iron: 31 ug/dL (ref 28–170)
Saturation Ratios: 8 % — ABNORMAL LOW (ref 10.4–31.8)
TIBC: 378 ug/dL (ref 250–450)
UIBC: 347 ug/dL

## 2023-09-15 LAB — CMP (CANCER CENTER ONLY)
ALT: 17 U/L (ref 0–44)
AST: 26 U/L (ref 15–41)
Albumin: 3.9 g/dL (ref 3.5–5.0)
Alkaline Phosphatase: 44 U/L (ref 38–126)
Anion gap: 9 (ref 5–15)
BUN: 27 mg/dL — ABNORMAL HIGH (ref 8–23)
CO2: 23 mmol/L (ref 22–32)
Calcium: 8.5 mg/dL — ABNORMAL LOW (ref 8.9–10.3)
Chloride: 103 mmol/L (ref 98–111)
Creatinine: 1.45 mg/dL — ABNORMAL HIGH (ref 0.44–1.00)
GFR, Estimated: 37 mL/min — ABNORMAL LOW (ref >60)
Glucose, Bld: 205 mg/dL — ABNORMAL HIGH (ref 70–99)
Potassium: 4.3 mmol/L (ref 3.5–5.1)
Sodium: 135 mmol/L (ref 135–145)
Total Bilirubin: 0.3 mg/dL (ref 0.0–1.2)
Total Protein: 7.1 g/dL (ref 6.5–8.1)

## 2023-09-15 LAB — FERRITIN: Ferritin: 13 ng/mL (ref 11–307)

## 2023-09-15 NOTE — Progress Notes (Signed)
 Starting about 2 weeks ago she stared waking up with the middle of her tongue was bleeding.

## 2023-09-15 NOTE — Assessment & Plan Note (Addendum)
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  Currently on adjuvant Gleevec  [3-5 years].  No evidence of recurrence.    # FEB 2025- - CT AP-  No findings for recurrent tumor, acute inflammatory process or obstructive findings. Persistent mesenteric and retroperitoneal lymph nodes, similar to the prior study. No new or progressive findings.   Since patient is high risk GIST-I would recommend 5 years [until end of  2026] especially as patient is tolerating Gleevec  without any major side effects.  However if she does develop any side effects we will discontinue Gleevec - stable. Will order CT scan today for next viist/ in 3 months.   # Tolerating Gleevec  well except for mild side effects [diarrhea]; swelling in the feet [see below]- stable.   # Swelling in the feet-grade 1 Continue compression stockings/diuretics as needed.  stable.   #CHF [Dr.McClean, cards-[place]]/COPD home O2 4-6Lits;-borderline respiratory status-  stable.   # CKD stage III-[diuretics]  stable.   # reflex- on PPI- stable.   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  Today hemoglobin is   11.8. Discussed with pt- FEB 2024-- I sat-18%;Ferritin-32. Continue PO iron  one pill every other day-  HOLD venofer  today-  stable.   # Myalgias/arthralgias question Gleevec  versus others- recommend ca+vit D; tylenol  as needed- stable.   Non-contrast # DISPOSITION:.   # HOLD venofer  today # Follow up in 3 month-  MD; labs-cbc/cmp;iron  studies/ferritin; possible venofer ; CT AP prior-  Dr.B

## 2023-09-15 NOTE — Progress Notes (Signed)
 [facility] CONSULT NOTE  Patient Care Team: Westley Hammers, MD as PCP - General (Unknown Physician Specialty) Wenona Hamilton, MD as PCP - Cardiology (Cardiology) Jerlean Mood, MD (General Surgery) Westley Hammers, MD (Internal Medicine) Prescilla Brod, Ninette Basque, MD (Vascular Surgery) Baldo Bonds, MD as Consulting Physician (Gastroenterology) Gwyn Leos, MD as Consulting Physician (Hematology and Oncology)  CHIEF COMPLAINTS/PURPOSE OF CONSULTATION: GIST   HEMATOLOGY HISTORY  Oncology History Overview Note  NOV 2021- CT Bx- TUMOR  Tumor Site: Peritoneum, jejunal  Histologic Type: Gastrointestinal stromal tumor, spindle cell type  Mitotic Rate: 1 per 5 mm squared  Histologic Grade: G1, low grade (mitotic rate less than or equal to 5  per 5 mm squared)- STAGE T2N1 [possible mesneteric LN]; CT A/P-9.8 x9.8 cm  # NOV 29th 2021- GLEEVEC  400 mg/day neoadjuvant therapy 6 months.   SURGICAL PATHOLOGY  CASE: [phone]  PATIENT: Susan  Barajas  Surgical Pathology Report      Specimen Submitted:  A. Lymph node, mesenteric  B. Lymph node, Proximal mesenteric  C. Small bowel with GIST   Clinical History: GIST small bowel       DIAGNOSIS:  A. LYMPH NODE, MESENTERIC; EXCISIONAL BIOPSY:  - REACTIVE LYMPHOID FOLLICULAR HYPERPLASIA WITH PROGRESSIVE  TRANSFORMATION OF GERMINAL CENTERS.  - NEGATIVE FOR MALIGNANCY, ONE LYMPH NODE (0/1).   B. LYMPH NODE, PROXIMAL MESENTERIC; EXCISIONAL BIOPSY:  - REACTIVE LYMPHOID FOLLICULAR HYPERPLASIA.  - NEGATIVE FOR MALIGNANCY, TWO LYMPH NODES (0/2).   C. SMALL BOWEL WITH GIST; RESECTION:  - GASTROINTESTINAL STROMAL TUMOR.  - SEE CANCER SUMMARY BELOW.   CASE SUMMARY: (GASTROINTESTINAL STROMAL TUMOR (GIST): Resection)  Standard(s): AJCC-UICC 8   CLINICAL  Pre-resection Treatment: Previous biopsy, systemic therapy performed  (Gleevec )   SPECIMEN  Procedure: Resection (partial small bowel)    TUMOR  Tumor Focality:  Unifocal  Tumor Site: Jejunum  Tumor Size: 9.7 x 9.2 x 6.3 cm  Histologic Type: Gastrointestinal stromal tumor, spindle cell type  Mitotic Rate:  Specify mitotic rate per 5 mm squared: Less than 5 per 5  mm squared  Histologic Grade: G1, low grade  Necrosis: Not identified  Treatment Effect: Present, approximately 30% viable tumor remains  Risk Assessment: Moderate (24%) risk of progressive disease   MARGINS:  Margin status:  Margin(s) involved by GIST: Deep, see comment.   Comment:  Tumor is present on ink on the periphery of the tumor nodule.  The  nature of the specimen precludes accurate assessment of where the tumor  may have been attached in situ (apart from the small bowel attachment).  Proximal and distal bowel margins cannot be reliably assessed, given  that they were received detached from the tumor specimen.   REGIONAL LYMPH NODES  Regional lymph node status:  Regional lymph nodes present, all regional lymph nodes negative for  tumor.  Number of lymph nodes examined: Three    # CHRONIC ANEMIA EGD/capsule-WNL-2018 [Dr.vanga]-UGIB ; colonoscopy-2018 [Elliot] ; capsule-not done; hemoglobin around 8.8; iron  saturation 5% [Dr.Tate].  Poor tolerance of p.o. 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   GIST (gastrointestinal stromal tumor) of small bowel, malignant (HCC)  03/12/2020 Initial Diagnosis   GIST (gastrointestinal stromal tumor) of small bowel, malignant (HCC)     HISTORY OF PRESENTING ILLNESS: Alone; ambulating independently. 6 L of oxygen nasal cannula.    Susan  Nimra Barajas 78 y.o.  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  Starting about 2 weeks ago she stared waking up with the middle of her tongue was bleeding.  Intermittent.  No trauma.  Denies any gum bleeds or nosebleeds.  Not on anticoagulation.  Patient denies any worsening swelling of the legs.  She continues to wear compression stockings.  Intermittently on diuretics. Denies any nausea vomiting from the Gleevec .  Review of Systems  Constitutional:  Positive for malaise/fatigue. Negative for chills, diaphoresis, fever and weight loss.  HENT:  Negative for nosebleeds and sore throat.   Eyes:  Negative for double vision.  Respiratory:  Positive for shortness of breath. Negative for cough, hemoptysis, sputum production and wheezing.   Cardiovascular:  Negative for chest pain, palpitations, orthopnea and leg swelling.  Gastrointestinal:  Negative for abdominal pain, blood in stool, constipation, diarrhea, heartburn, melena, nausea and vomiting.  Genitourinary:  Negative for dysuria, frequency and urgency.  Musculoskeletal:  Negative for back pain and joint pain.  Skin: Negative.  Negative for itching and rash.  Neurological:  Negative for dizziness, tingling, focal weakness, weakness and headaches.  Endo/Heme/Allergies:  Does not bruise/bleed easily.  Psychiatric/Behavioral:  Negative for depression. The patient is not nervous/anxious and does not have insomnia.     MEDICAL HISTORY:  Past Medical History:  Diagnosis Date   Anemia    Asthma 1950   CHF (congestive heart failure) (HCC)    COPD (chronic obstructive pulmonary disease) (HCC)    Cor pulmonale (HCC)    DVT (deep venous thrombosis) (HCC) 2018   GERD (gastroesophageal reflux disease)    GIST (gastrointestinal stromal tumor) of small bowel, malignant (HCC)    Glaucoma 1949   Hyperlipidemia    Hypertension    Obesity, unspecified    OSA treated with BiPAP     Personal history of tobacco use, presenting hazards to health    Pneumonia    Presence of IVC filter    Pulmonary HTN (HCC)    Supplemental oxygen dependent    T2DM (type 2 diabetes mellitus) (HCC)    Varicose veins of lower extremities with other complications     SURGICAL HISTORY: Past Surgical History:  Procedure Laterality Date   BREAST BIOPSY Right 1991   BREAST BIOPSY Left 2013   COLONOSCOPY  2010   Dr. Felicita Horns   COLONOSCOPY  Jan 2016   Dr Felicita Horns   ESOPHAGOGASTRODUODENOSCOPY N/A 11/26/2016   Procedure: ESOPHAGOGASTRODUODENOSCOPY (EGD);  Surgeon: Selena Daily, MD;  Location: [facility] ENDOSCOPY;  Service: Gastroenterology;  Laterality: N/A;   GIVENS CAPSULE STUDY N/A 11/26/2016   Procedure: GIVENS CAPSULE STUDY, if EGD negative, plan to drop capsule with scope if EGD negative;  Surgeon: Selena Daily, MD;  Location: [facility] ENDOSCOPY;  Service: Gastroenterology;  Laterality: N/A;   IVC FILTER INSERTION N/A 11/14/2016   Procedure: IVC Filter Insertion;  Surgeon: Jackquelyn Mass, MD;  Location: [facility] INVASIVE CV LAB;  Service: Cardiovascular;  Laterality: N/A;   PARTIAL COLECTOMY N/A 11/05/2020   Procedure: SMALL BOWEL RESECTION, REMOVAL OF GIST TUMOR AND LYMPH NODES, UMBILICAL HERNIA REPAIR;  Surgeon: Marshall Skeeter, MD;  Location:  [facility] ORS;  Service: General;  Laterality: N/A;  small bowel resection, RNFA to assist; TAPP block per anesthesia   RIGHT/LEFT HEART CATH AND CORONARY ANGIOGRAPHY N/A 06/30/2019   Procedure: RIGHT/LEFT HEART CATH AND CORONARY ANGIOGRAPHY;  Surgeon: Darlis Eisenmenger, MD;  Location: [facility] INVASIVE CV LAB;  Service: Cardiovascular;  Laterality: N/A;   VEIN SURGERY Right 2006   Vein Closure Procedure; RF ablation of right GSV    SOCIAL HISTORY: Social History   Socioeconomic History   Marital status: Single    Spouse name: Not on file   Number of children: 2   Years of education: Not on file   Highest education level: Not on file  Occupational  History   Occupation: CELL    Employer: RETIRED  Tobacco Use   Smoking status: Former    Current packs/day: 0.00    Average packs/day: 1.5 packs/day for 20.0 years (30.0 ttl pk-yrs)    Types: Cigarettes    Start date: 51    Quit date: 1999    Years since quitting: 26.4   Smokeless tobacco: Never  Vaping Use   Vaping status: Never Used  Substance and Sexual Activity   Alcohol use: No    Alcohol/week: 0.0 standard drinks of alcohol   Drug use: No   Sexual activity: Not Currently  Other Topics Concern   Not on file  Social History Narrative   Independent at baseline. Lives at home with her brother; in [place]. Quit smoking 20 years; no alcohol.    Social Drivers of Corporate investment banker Strain: Not on file  Food Insecurity: Not on file  Transportation Needs: Not on file  Physical Activity: Not on file  Stress: Not on file  Social Connections: Not on file  Intimate Partner Violence: Not on file    FAMILY HISTORY: Family History  Problem Relation Age of Onset   Prostate cancer Father    Other Sister        mouth cancer   Hypertension Mother     ALLERGIES:  has no known allergies.  MEDICATIONS:  Current Outpatient Medications  Medication Sig Dispense Refill   ACCU-CHEK AVIVA PLUS test strip 1 each 2 (two) times daily.     Accu-Chek Softclix Lancets lancets USE 1 TO CHECK GLUCOSE TWICE DAILY     acetaminophen  (TYLENOL ) 325 MG tablet Take 2 tablets (650 mg total) by mouth every 6 (six) hours as needed for mild pain (or Fever >/= 101).     aspirin  81 MG chewable tablet Chew 1 tablet (81 mg total) by mouth daily. 30 tablet 0   clobetasol  cream (TEMOVATE ) 0.05 % Apply 1 Application topically 3 (three) times a week.     dorzolamide-timolol  (COSOPT) 2-0.5 % ophthalmic solution 1 drop 2 (two) times daily.     FARXIGA  10 MG TABS tablet Take 1 tablet by mouth once daily 90 tablet 3   ferrous gluconate  (FERGON) 325 MG tablet Take 325 mg by mouth every other day.      furosemide  (LASIX ) 40 MG tablet Take 1 tablet (40 mg total) by mouth every morning AND 0.5 tablets (20 mg total) every evening. 270 tablet 3   imatinib  (GLEEVEC ) 100 MG tablet TAKE 4 TABLETS (400 MG TOTAL) BY MOUTH DAILY. TAKE WITH MEALS AND LARGE GLASS OF WATER 120 tablet 2   insulin  glargine (LANTUS ) 100 UNIT/ML injection Inject 32 Units into the skin in the morning.     latanoprost  (XALATAN ) 0.005 % ophthalmic solution Place 1 drop  into both eyes at bedtime.      loperamide (IMODIUM) 2 MG capsule Take by mouth as needed for diarrhea or loose stools.     lovastatin (MEVACOR) 20 MG tablet Take 20 mg by mouth every evening.      metFORMIN  (GLUCOPHAGE ) 1000 MG tablet Take 1,000 mg by mouth daily.      Multiple Vitamin (MULTIVITAMIN) tablet Take 1 tablet by mouth daily.     omeprazole  (PRILOSEC) 40 MG capsule Take 40 mg by mouth daily.     OPSUMIT  10 MG tablet Take 1 tablet (10 mg total) by mouth daily. 90 tablet 3   potassium chloride  SA (KLOR-CON  M) 20 MEQ tablet Take 2 tablets (40 mEq total) by mouth daily. 180 tablet 3   tadalafil , PAH, (ADCIRCA ) 20 MG tablet TAKE 2 TABLETS DAILY 60 tablet 3   timolol  (TIMOPTIC ) 0.5 % ophthalmic solution Place 1 drop into both eyes 2 (two) times daily.      Tiotropium Bromide-Olodaterol (STIOLTO RESPIMAT ) 2.5-2.5 MCG/ACT AERS INHALE 2 PUFFS BY MOUTH ONCE DAILY 4 g 11   No current facility-administered medications for this visit.      PHYSICAL EXAMINATION:   Vitals:   09/15/23 1411  BP: (!) 120/43  Pulse: 88  Resp: 16  Temp: 100 F (37.8 C)  SpO2: 90%     Filed Weights   09/15/23 1411  Weight: 209 lb (94.8 kg)      Physical Exam HENT:     Head: Normocephalic and atraumatic.     Mouth/Throat:     Pharynx: No oropharyngeal exudate.  Eyes:     Pupils: Pupils are equal, round, and reactive to light.  Cardiovascular:     Rate and Rhythm: Normal rate and regular rhythm.  Pulmonary:     Effort: No respiratory distress.     Breath sounds:  No wheezing.     Comments: Decreased breath sounds. Abdominal:     General: Bowel sounds are normal. There is no distension.     Palpations: Abdomen is soft. There is no mass.     Tenderness: There is no abdominal tenderness. There is no guarding or rebound.  Musculoskeletal:        General: No tenderness. Normal range of motion.     Cervical back: Normal range of motion and neck supple.  Skin:    General: Skin is warm.  Neurological:     Mental Status: She is alert and oriented to person, place, and time.  Psychiatric:        Mood and Affect: Affect normal.     LABORATORY DATA:  I have reviewed the data as listed Lab Results  Component Value Date   WBC 6.0 09/15/2023   HGB 11.3 (L) 09/15/2023   HCT 35.3 (L) 09/15/2023   MCV 94.4 09/15/2023   PLT 224 09/15/2023   Recent Labs    03/17/23 1423 06/15/23 1405 08/14/23 1402 08/28/23 1002 09/15/23 1356  NA 136 138 137 134* 135  K 4.1 4.0 4.7 4.4 4.3  CL 102 103 104 103 103  CO2 23 26 21 22 23   GLUCOSE 170* 88 56* 196* 205*  BUN 22 27* 17 25* 27*  CREATININE 1.14* 1.21* 1.14* 1.28* 1.45*  CALCIUM  8.7* 9.1 9.1 9.0 8.5*  GFRNONAA 50* 46*  --  43* 37*  PROT 7.0 7.2  --   --  7.1  ALBUMIN  3.8 3.9  --   --  3.9  AST 23 20  --   --  26  ALT 16 18  --   --  17  ALKPHOS 43 44  --   --  44  BILITOT 0.5 0.5  --   --  0.3     No results found.    GIST (gastrointestinal stromal tumor) of small bowel, malignant (HCC) # NOV 2021-GIST of small bowel-low grade- high risk [ non-gastric primary GIST >=5 cm.] STAGE II [T3N0]-s/p neoadjuvant therapy with Gleevec  x6 months [November-end of June 2022]-s/p resection ~30% viable tumor noted post resection specimen.  Currently on adjuvant Gleevec  [3-5 years].  No evidence of recurrence.    # FEB 2025- - CT AP-  No findings for recurrent tumor, acute inflammatory process or obstructive findings. Persistent mesenteric and retroperitoneal lymph nodes, similar to the prior study. No new or  progressive findings.   Since patient is high risk GIST-I would recommend 5 years [until end of  2026] especially as patient is tolerating Gleevec  without any major side effects.  However if she does develop any side effects we will discontinue Gleevec - stable. Will order CT scan today for next viist/ in 3 months.   # Tolerating Gleevec  well except for mild side effects [diarrhea]; swelling in the feet [see below]- stable.   # Swelling in the feet-grade 1 Continue compression stockings/diuretics as needed.  stable.   #CHF [Dr.McClean, cards-[place]]/COPD home O2 4-6Lits;-borderline respiratory status-  stable.   # CKD stage III-[diuretics]  stable.   # reflex- on PPI- stable.   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  Today hemoglobin is   11.8. Discussed with pt- FEB 2024-- I sat-18%;Ferritin-32. Continue PO iron  one pill every other day-  HOLD venofer  today-  stable.   # Myalgias/arthralgias question Gleevec  versus others- recommend ca+vit D; tylenol  as needed- stable.   Non-contrast # DISPOSITION:.   # HOLD venofer  today # Follow up in 3 month-  MD; labs-cbc/cmp;iron  studies/ferritin; possible venofer ; CT AP prior-  Dr.B   All questions were answered. The patient knows to call the clinic with any problems, questions or concerns.    Gwyn Leos, MD 09/15/2023 2:52 PM

## 2023-09-17 ENCOUNTER — Other Ambulatory Visit: Payer: Self-pay

## 2023-09-29 ENCOUNTER — Ambulatory Visit
Admission: RE | Admit: 2023-09-29 | Discharge: 2023-09-29 | Disposition: A | Discharge status: Home / Self Care | Source: Ambulatory Visit | Attending: Cardiology | Admitting: Cardiology

## 2023-09-29 DIAGNOSIS — J449 Chronic obstructive pulmonary disease, unspecified: Secondary | ICD-10-CM | POA: Insufficient documentation

## 2023-09-29 DIAGNOSIS — I10 Essential (primary) hypertension: Secondary | ICD-10-CM | POA: Insufficient documentation

## 2023-09-29 DIAGNOSIS — I272 Pulmonary hypertension, unspecified: Secondary | ICD-10-CM | POA: Insufficient documentation

## 2023-09-29 LAB — ECHOCARDIOGRAM COMPLETE
AR max vel: 3.17 cm2
AV Area VTI: 3.07 cm2
AV Area mean vel: 3.36 cm2
AV Mean grad: 3.5 mmHg
AV Peak grad: 6.1 mmHg
Ao pk vel: 1.24 m/s
Area-P 1/2: 3.7 cm2
MV VTI: 2.73 cm2
S' Lateral: 3 cm

## 2023-09-29 NOTE — Progress Notes (Signed)
*  PRELIMINARY RESULTS* Echocardiogram 2D Echocardiogram has been performed.  Susan Barajas 09/29/2023, 11:36 AM

## 2023-10-13 ENCOUNTER — Other Ambulatory Visit: Payer: Self-pay

## 2023-10-13 NOTE — Progress Notes (Signed)
 Specialty Pharmacy Refill Coordination Note  Susan  Yailin Barajas is a 78 y.o. female contacted today regarding refills of specialty medication(s) Imatinib  Mesylate (GLEEVEC )   Patient requested Delivery   Delivery date: 10/15/23   Verified address: [address]  [place] [id number]   Medication will be filled on 10/14/23.

## 2023-10-14 ENCOUNTER — Other Ambulatory Visit: Payer: Self-pay

## 2023-10-23 ENCOUNTER — Other Ambulatory Visit: Payer: Self-pay

## 2023-10-23 ENCOUNTER — Encounter: Payer: Self-pay | Admitting: Internal Medicine

## 2023-10-23 ENCOUNTER — Ambulatory Visit: Admitting: Internal Medicine

## 2023-10-23 VITALS — BP 124/82 | HR 77 | Temp 98.9°F | Resp 16 | Ht 65.0 in | Wt 210.2 lb

## 2023-10-23 DIAGNOSIS — Z86718 Personal history of other venous thrombosis and embolism: Secondary | ICD-10-CM

## 2023-10-23 DIAGNOSIS — G4733 Obstructive sleep apnea (adult) (pediatric): Secondary | ICD-10-CM

## 2023-10-23 DIAGNOSIS — H409 Unspecified glaucoma: Secondary | ICD-10-CM

## 2023-10-23 DIAGNOSIS — C49A3 Gastrointestinal stromal tumor of small intestine: Secondary | ICD-10-CM

## 2023-10-23 DIAGNOSIS — E785 Hyperlipidemia, unspecified: Secondary | ICD-10-CM

## 2023-10-23 DIAGNOSIS — J449 Chronic obstructive pulmonary disease, unspecified: Secondary | ICD-10-CM

## 2023-10-23 DIAGNOSIS — D500 Iron deficiency anemia secondary to blood loss (chronic): Secondary | ICD-10-CM

## 2023-10-23 DIAGNOSIS — I272 Pulmonary hypertension, unspecified: Secondary | ICD-10-CM

## 2023-10-23 DIAGNOSIS — I5032 Chronic diastolic (congestive) heart failure: Secondary | ICD-10-CM

## 2023-10-23 DIAGNOSIS — K219 Gastro-esophageal reflux disease without esophagitis: Secondary | ICD-10-CM

## 2023-10-23 DIAGNOSIS — L309 Dermatitis, unspecified: Secondary | ICD-10-CM

## 2023-10-23 DIAGNOSIS — E1169 Type 2 diabetes mellitus with other specified complication: Secondary | ICD-10-CM

## 2023-10-23 LAB — POCT GLYCOSYLATED HEMOGLOBIN (HGB A1C): Hemoglobin A1C: 6.1 % — AB (ref 4.0–5.6)

## 2023-10-23 MED ORDER — ALBUTEROL SULFATE HFA 108 (90 BASE) MCG/ACT IN AERS: 2.0000 | INHALATION_SPRAY | Freq: Four times a day (QID) | RESPIRATORY_TRACT | 0 refills | Status: DC | PRN

## 2023-10-23 MED ORDER — FREESTYLE LIBRE 3 PLUS SENSOR MISC: 6 refills | Status: DC

## 2023-10-23 NOTE — Progress Notes (Signed)
 New Patient Office Visit  Subjective    Patient ID: Susan  [name], female    DOB: 1945/05/24  Age: 78 y.o. MRN: [id number]  CC:  Chief Complaint  Patient presents with   Establish Care    HPI Susan  Susan Barajas presents to establish care. Her previous PCP retired.   Hypertension/CHF/Pulmonary HTN/OSA: -Medications: Lasix  40 mg, KCl 20 meQ, Opsulmit 10 mg, Tadafil 40 mg, Farxiga  10 mg -Patient is compliant with above medications and reports no side effects. -Denies any SOB, CP, vision changes, LE edema or symptoms of hypotension -OSA on CPAP -On supplemental oxygen 4L at rest and 6L with exertion  -Last echo 6/25 EF 60-65% -Following with Cardiology and heart failure specialist   Hx of DVT: -Hx of one blood clot in 2018 in right lower extremity, no longer on anticoagulation and s/p IVC filter  HLD: -Medications: Lovastatin 20 mg, aspirin  -Patient is compliant with above medications and reports no side effects.  -Last lipid panel: Lipid Panel     Component Value Date/Time   CHOL 125 08/12/2022 0908   TRIG 90 08/12/2022 0908   HDL 44 08/12/2022 0908   CHOLHDL 2.8 08/12/2022 0908   VLDL 18 08/12/2022 0908   LDLCALC 63 08/12/2022 0908    Diabetes, Type 2: -Last A1c 6.3% in 2022 -Diagnosed about 40 years ago, strong family history  -Medications: Metformin  1000 mg daily, Farxiga  10 mg, Lantus  32 units in the morning, Humalog 10 units at breakfast and 10 units at dinner  -Patient is compliant with the above medications and reports no side effects.  -Checking BG at home: fasting 130-140. Rarely has low blood sugar  -Eye exam: follows with Ophthalmology (glaucoma) -Foot exam: Uncertain, obtaining records -Microalbumin: Uncertain, obtaining records  -Statin: yes -PNA vaccine: UTD - obtaining records from [facility] -Denies symptoms of hypoglycemia, polyuria, polydipsia, numbness extremities, foot ulcers/trauma.  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, s/p resection in 2022 -Currently on Imatinib    Chronic Anemia: -Initially how GIST was discovered, had been on IV Venofer  but now on oral -Currently on ferrous gluconate  325 mg every other day -Last CBC - hgb 11/3, ferritin 13  Glaucoma:  -Following with Ophthalmology -Currently on Dorzolamide-Timolol  drops  GERD: -Currently on Prilosec 40 mg -Symptoms well controlled   Eczema:  -Following with Dermatology  -Currently using Clobetasol  cream as needed   Health Maintenance: -Blood work UTD -Vaccines UTD - getting records     Outpatient Encounter Medications as of 10/23/2023  Medication Sig   acetaminophen  (TYLENOL ) 325 MG tablet Take 2 tablets (650 mg total) by mouth every 6 (six) hours as needed for mild pain (or Fever >/= 101).   aspirin  81 MG chewable tablet Chew 1 tablet (81 mg total) by mouth daily.   clobetasol  cream (TEMOVATE ) 0.05 % Apply 1 Application topically 3 (three) times a week.   dorzolamide-timolol  (COSOPT) 2-0.5 % ophthalmic solution 1 drop 2 (two) times daily.   FARXIGA  10 MG TABS tablet Take 1 tablet by mouth once daily   ferrous gluconate  (FERGON) 325 MG tablet Take 325 mg by mouth every other day.   furosemide  (LASIX ) 40 MG tablet Take 1 tablet (40 mg total) by mouth every morning AND 0.5 tablets (20 mg total) every evening.   HUMALOG KWIKPEN  100 UNIT/ML KwikPen Inject into the skin.   imatinib  (GLEEVEC ) 100 MG tablet TAKE 4 TABLETS (400 MG TOTAL) BY MOUTH DAILY. TAKE WITH MEALS AND LARGE GLASS OF WATER   insulin  glargine (LANTUS ) 100 UNIT/ML injection Inject 32 Units into the skin in the morning.   latanoprost  (XALATAN ) 0.005 % ophthalmic solution Place 1 drop into both eyes at bedtime.    loperamide (IMODIUM) 2 MG  capsule Take by mouth as needed for diarrhea or loose stools.   lovastatin (MEVACOR) 20 MG tablet Take 20 mg by mouth every evening.    metFORMIN  (GLUCOPHAGE ) 1000 MG tablet Take 1,000 mg by mouth daily.    Multiple Vitamin (MULTIVITAMIN) tablet Take 1 tablet by mouth daily.   omeprazole  (PRILOSEC) 40 MG capsule Take 40 mg by mouth daily.   OPSUMIT  10 MG tablet Take 1 tablet (10 mg total) by mouth daily.   potassium chloride  SA (KLOR-CON  M) 20 MEQ tablet Take 2 tablets (40 mEq total) by mouth daily.   tadalafil , PAH, (ADCIRCA ) 20 MG tablet TAKE 2 TABLETS DAILY   timolol  (TIMOPTIC ) 0.5 % ophthalmic solution Place 1 drop into both eyes 2 (two) times daily.    Tiotropium Bromide-Olodaterol (STIOLTO RESPIMAT ) 2.5-2.5 MCG/ACT AERS INHALE 2 PUFFS BY MOUTH ONCE DAILY   ACCU-CHEK AVIVA PLUS test strip 1 each 2 (two) times daily. (Patient not taking: Reported on 10/23/2023)   Accu-Chek Softclix Lancets lancets USE 1 TO CHECK GLUCOSE TWICE DAILY (Patient not taking: Reported on 10/23/2023)   No facility-administered encounter medications on file as of 10/23/2023.    Past Medical History:  Diagnosis Date   Anemia    Asthma 1950   CHF (congestive heart failure) (HCC)    COPD (chronic obstructive pulmonary disease) (HCC)    Cor pulmonale (HCC)    DVT (deep venous thrombosis) (HCC) 2018   GERD (gastroesophageal reflux disease)    GIST (gastrointestinal stromal tumor) of small bowel, malignant (HCC)    Glaucoma 1949   Hyperlipidemia    Hypertension    Obesity, unspecified    OSA treated with BiPAP    Personal history of tobacco use, presenting hazards to health    Pneumonia    Presence of IVC filter    Pulmonary HTN (HCC)    Supplemental oxygen dependent    T2DM (type 2 diabetes mellitus) (HCC)    Varicose veins of lower extremities with other complications     Past Surgical History:  Procedure Laterality Date   BREAST BIOPSY Right 1991   BREAST BIOPSY Left 2013   COLONOSCOPY  2010   Dr.  Viktoria   COLONOSCOPY  Jan 2016   Dr Viktoria   ESOPHAGOGASTRODUODENOSCOPY N/A 11/26/2016   Procedure: ESOPHAGOGASTRODUODENOSCOPY (EGD);  Surgeon: Unk Corinn Skiff, MD;  Location: [facility] ENDOSCOPY;  Service: Gastroenterology;  Laterality: N/A;   GIVENS CAPSULE STUDY N/A 11/26/2016   Procedure: GIVENS CAPSULE STUDY, if EGD negative, plan to drop capsule with scope if EGD negative;  Surgeon: Unk Corinn Skiff, MD;  Location: [facility] ENDOSCOPY;  Service: Gastroenterology;  Laterality: N/A;   IVC FILTER INSERTION N/A 11/14/2016   Procedure: IVC Filter Insertion;  Surgeon: Jama Cordella MATSU, MD;  Location: [facility] INVASIVE CV LAB;  Service: Cardiovascular;  Laterality: N/A;   PARTIAL COLECTOMY N/A 11/05/2020   Procedure: SMALL BOWEL RESECTION, REMOVAL OF GIST TUMOR AND LYMPH NODES, UMBILICAL HERNIA REPAIR;  Surgeon: Dessa Reyes ORN, MD;  Location: [facility] ORS;  Service: General;  Laterality: N/A;  small bowel resection, RNFA to assist; TAPP block  per anesthesia   RIGHT/LEFT HEART CATH AND CORONARY ANGIOGRAPHY N/A 06/30/2019   Procedure: RIGHT/LEFT HEART CATH AND CORONARY ANGIOGRAPHY;  Surgeon: Rolan Ezra RAMAN, MD;  Location: [facility] INVASIVE CV LAB;  Service: Cardiovascular;  Laterality: N/A;   VEIN SURGERY Right 2006   Vein Closure Procedure; RF ablation of right GSV    Family History  Problem Relation Age of Onset   Prostate cancer Father    Other Sister        mouth cancer   Hypertension Mother     Social History   Socioeconomic History   Marital status: Single    Spouse name: Not on file   Number of children: 2   Years of education: Not on file   Highest education level: Not on file  Occupational History   Occupation: CELL    Employer: RETIRED  Tobacco Use   Smoking status: Former    Current packs/day: 0.00    Average packs/day: 1.5 packs/day for 20.0 years (30.0 ttl pk-yrs)    Types: Cigarettes    Start date: 52    Quit date: 1999    Years since quitting: 26.5   Smokeless tobacco:  Never  Vaping Use   Vaping status: Never Used  Substance and Sexual Activity   Alcohol use: No    Alcohol/week: 0.0 standard drinks of alcohol   Drug use: No   Sexual activity: Not Currently  Other Topics Concern   Not on file  Social History Narrative   Independent at baseline. Lives at home with her brother; in [place]. Quit smoking 20 years; no alcohol.    Social Drivers of Corporate investment banker Strain: Not on file  Food Insecurity: Not on file  Transportation Needs: Not on file  Physical Activity: Not on file  Stress: Not on file  Social Connections: Not on file  Intimate Partner Violence: Not on file    Review of Systems  All other systems reviewed and are negative.       Objective    BP 124/82 (Cuff Size: Large)   Pulse 77   Temp 98.9 F (37.2 C) (Oral)   Resp 16   Ht 5' 5 (1.651 m)   Wt 210 lb 3.2 oz (95.3 kg)   SpO2 (!) 84% Comment: on oxygen  BMI 34.98 kg/m   Physical Exam Constitutional:      Appearance: Normal appearance.  HENT:     Head: Normocephalic and atraumatic.     Mouth/Throat:     Mouth: Mucous membranes are moist.     Pharynx: Oropharynx is clear.  Eyes:     Extraocular Movements: Extraocular movements intact.     Conjunctiva/sclera: Conjunctivae normal.     Pupils: Pupils are equal, round, and reactive to light.  Neck:     Comments: No thyromegaly Cardiovascular:     Rate and Rhythm: Normal rate and regular rhythm.  Pulmonary:     Effort: Pulmonary effort is normal.     Breath sounds: Normal breath sounds.     Comments: On O2 Hall Musculoskeletal:     Cervical back: No tenderness.     Right lower leg: No edema.     Left lower leg: No edema.  Lymphadenopathy:     Cervical: No cervical adenopathy.  Skin:    General: Skin is warm and dry.  Neurological:     General: No focal deficit present.     Mental Status: She is alert. Mental status is at baseline.  Psychiatric:  Mood and Affect: Mood normal.         Behavior: Behavior normal.     Last CBC Lab Results  Component Value Date   WBC 6.0 09/15/2023   HGB 11.3 (L) 09/15/2023   HCT 35.3 (L) 09/15/2023   MCV 94.4 09/15/2023   MCH 30.2 09/15/2023   RDW 14.0 09/15/2023   PLT 224 09/15/2023   Last metabolic panel Lab Results  Component Value Date   GLUCOSE 205 (H) 09/15/2023   NA 135 09/15/2023   K 4.3 09/15/2023   CL 103 09/15/2023   CO2 23 09/15/2023   BUN 27 (H) 09/15/2023   CREATININE 1.45 (H) 09/15/2023   GFRNONAA 37 (L) 09/15/2023   CALCIUM  8.5 (L) 09/15/2023   PROT 7.1 09/15/2023   ALBUMIN  3.9 09/15/2023   LABGLOB 3.2 08/15/2019   BILITOT 0.3 09/15/2023   ALKPHOS 44 09/15/2023   AST 26 09/15/2023   ALT 17 09/15/2023   ANIONGAP 9 09/15/2023   Last lipids Lab Results  Component Value Date   CHOL 125 08/12/2022   HDL 44 08/12/2022   LDLCALC 63 08/12/2022   TRIG 90 08/12/2022   CHOLHDL 2.8 08/12/2022   Last hemoglobin A1c Lab Results  Component Value Date   HGBA1C 6.1 (A) 10/23/2023   Last thyroid functions Lab Results  Component Value Date   TSH 2.080 11/15/2016   Last vitamin D No results found for: 25OHVITD2, 25OHVITD3, VD25OH Last vitamin B12 and Folate Lab Results  Component Value Date   VITAMINB12 1,118 (H) 11/25/2016        Assessment & Plan:   Assessment & Plan Chronic Obstructive Pulmonary Disease (COPD) Chronic COPD managed with Stiletto Respimat inhaler. Following with Pulmonology. - Prescribe albuterol  inhaler for rescue use.  Obstructive Sleep Apnea Chronic obstructive sleep apnea managed with CPAP therapy. She follows with a pulmonologist for CPAP settings.  Pulmonary Hypertension Chronic pulmonary hypertension managed with Tadalafil  40 mg and Opsulmit 10 mg. She follows with a pulmonologist. On supplemental oxygen.  Congestive Diastolic Heart Failure Chronic congestive heart failure managed with Lasix  40 mg daily and potassium supplementation as well as Farxiga  10 mg..  She follows with a heart failure specialist.  Systemic Hypertension Chronic systemic hypertension well-controlled with current medications.  Type 2 Diabetes Mellitus with Hyperglycemia Type 2 diabetes mellitus well-controlled with an A1c of 6.1%. Managed with Metformin  1000 mg daily, Farxiga  10 mg, Lantus  32 units in the morning, and Humalog 10 units at breakfast and dinner. Discussed the use of a continuous glucose monitor to better manage blood glucose levels and prevent hypoglycemia. - Initiate Freestyle Libre continuous glucose monitor trial. - Monitor blood glucose levels and adjust insulin  regimen if necessary.  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 Currently on Gleevec  400 mg daily. Follow-up with oncologist every four months.  Iron  Deficiency On PO iron  supplements, labs consistent with chronic anemia. Following with Hematology.   Hx of DVT In right lower extremity, not on anticoagulation, s/p IVC filter.   Glaucoma Chronic glaucoma managed by ophthalmologist. On daily eye drops.  Eczema Chronic eczema exacerbated by CPAP strap. Managed with clobetasol  cream prescribed by dermatologist.  Gastroesophageal Reflux Disease (GERD) Chronic GERD managed with Prilosec. Symptoms well-controlled.  General Health Maintenance Up to date with vaccinations, including pneumonia, shingles, RSV, and COVID-19 vaccines.  Follow-up She has multiple specialists and a complex medical history. Coordination of care is essential. - Schedule follow-up appointment in three months. - Obtain and review medical  records from previous providers. - Ensure pharmacy is updated with new primary care provider information.  - POCT HgB A1C - Continuous Glucose Sensor (FREESTYLE LIBRE 3 PLUS SENSOR) MISC; Change sensor every 15 days.  Dispense: 2 each; Refill: 6 - albuterol  (VENTOLIN  HFA) 108 (90 Base) MCG/ACT inhaler; Inhale 2 puffs into the  lungs every 6 (six) hours as needed for wheezing or shortness of breath.  Dispense: 8 g; Refill: 0  Return in about 3 months (around 01/23/2024).   Sharyle Fischer, DO

## 2023-10-30 ENCOUNTER — Other Ambulatory Visit: Payer: Self-pay

## 2023-11-04 ENCOUNTER — Other Ambulatory Visit: Payer: Self-pay | Admitting: Internal Medicine

## 2023-11-04 ENCOUNTER — Other Ambulatory Visit: Payer: Self-pay

## 2023-11-04 ENCOUNTER — Other Ambulatory Visit: Payer: Self-pay | Admitting: Pharmacy Technician

## 2023-11-04 DIAGNOSIS — C49A3 Gastrointestinal stromal tumor of small intestine: Secondary | ICD-10-CM

## 2023-11-04 NOTE — Progress Notes (Signed)
 Specialty Pharmacy Refill Coordination Note  Susan  Hanni Barajas is a 78 y.o. female contacted today regarding refills of specialty medication(s) Imatinib  Mesylate (GLEEVEC )   Patient requested Delivery   Delivery date: 11/09/23   Verified address: [address] [facility] [address]   Medication will be filled on 11/06/23.  This fill date is pending response to refill request from provider. Patient is aware and if they have not received fill by intended date they must follow up with pharmacy.

## 2023-11-05 ENCOUNTER — Other Ambulatory Visit: Payer: Self-pay

## 2023-11-05 ENCOUNTER — Encounter: Payer: Self-pay | Admitting: Internal Medicine

## 2023-11-05 MED ORDER — IMATINIB MESYLATE 100 MG PO TABS
ORAL_TABLET | ORAL | 2 refills | Status: DC
Start: 2023-11-05 — End: 2023-12-29
  Filled 2023-11-05: qty 120, 30d supply, fill #0
  Filled 2023-11-27 – 2023-11-30 (×2): qty 120, 30d supply, fill #1

## 2023-11-06 ENCOUNTER — Other Ambulatory Visit: Payer: Self-pay

## 2023-11-17 ENCOUNTER — Telehealth: Payer: Self-pay | Admitting: Cardiology

## 2023-11-17 NOTE — Telephone Encounter (Signed)
 Called to confirm/remind patient of their appointment at the [facility] on 11/18/23.   Appointment:   [x] Confirmed  [] Left mess   [] No answer/No voice mail  [] VM Full/unable to leave message  [] Phone not in service  Patient reminded to bring all medications and/or complete list.  Confirmed patient has transportation. Gave directions, instructed to utilize valet parking.

## 2023-11-18 ENCOUNTER — Other Ambulatory Visit
Admission: RE | Admit: 2023-11-18 | Discharge: 2023-11-18 | Disposition: A | Discharge status: Home / Self Care | Source: Ambulatory Visit | Attending: Cardiology | Admitting: Cardiology

## 2023-11-18 ENCOUNTER — Ambulatory Visit (HOSPITAL_COMMUNITY): Payer: Self-pay | Admitting: Cardiology

## 2023-11-18 ENCOUNTER — Ambulatory Visit (HOSPITAL_BASED_OUTPATIENT_CLINIC_OR_DEPARTMENT_OTHER): Admitting: Cardiology

## 2023-11-18 VITALS — BP 132/53 | HR 81 | Wt 210.0 lb

## 2023-11-18 DIAGNOSIS — D649 Anemia, unspecified: Secondary | ICD-10-CM | POA: Diagnosis not present

## 2023-11-18 DIAGNOSIS — G4733 Obstructive sleep apnea (adult) (pediatric): Secondary | ICD-10-CM | POA: Diagnosis not present

## 2023-11-18 DIAGNOSIS — I5032 Chronic diastolic (congestive) heart failure: Secondary | ICD-10-CM | POA: Insufficient documentation

## 2023-11-18 DIAGNOSIS — Z86718 Personal history of other venous thrombosis and embolism: Secondary | ICD-10-CM | POA: Insufficient documentation

## 2023-11-18 DIAGNOSIS — Z9981 Dependence on supplemental oxygen: Secondary | ICD-10-CM | POA: Insufficient documentation

## 2023-11-18 DIAGNOSIS — Z8673 Personal history of transient ischemic attack (TIA), and cerebral infarction without residual deficits: Secondary | ICD-10-CM | POA: Insufficient documentation

## 2023-11-18 DIAGNOSIS — I2721 Secondary pulmonary arterial hypertension: Secondary | ICD-10-CM | POA: Insufficient documentation

## 2023-11-18 DIAGNOSIS — Z79899 Other long term (current) drug therapy: Secondary | ICD-10-CM | POA: Insufficient documentation

## 2023-11-18 DIAGNOSIS — I110 Hypertensive heart disease with heart failure: Secondary | ICD-10-CM | POA: Diagnosis not present

## 2023-11-18 DIAGNOSIS — J439 Emphysema, unspecified: Secondary | ICD-10-CM | POA: Diagnosis not present

## 2023-11-18 DIAGNOSIS — I272 Pulmonary hypertension, unspecified: Secondary | ICD-10-CM

## 2023-11-18 DIAGNOSIS — E119 Type 2 diabetes mellitus without complications: Secondary | ICD-10-CM | POA: Insufficient documentation

## 2023-11-18 DIAGNOSIS — C49A0 Gastrointestinal stromal tumor, unspecified site: Secondary | ICD-10-CM | POA: Diagnosis not present

## 2023-11-18 DIAGNOSIS — J9611 Chronic respiratory failure with hypoxia: Secondary | ICD-10-CM | POA: Insufficient documentation

## 2023-11-18 DIAGNOSIS — Z7984 Long term (current) use of oral hypoglycemic drugs: Secondary | ICD-10-CM | POA: Diagnosis not present

## 2023-11-18 LAB — BASIC METABOLIC PANEL WITH GFR
Anion gap: 8 (ref 5–15)
BUN: 23 mg/dL (ref 8–23)
CO2: 26 mmol/L (ref 22–32)
Calcium: 8.7 mg/dL — ABNORMAL LOW (ref 8.9–10.3)
Chloride: 103 mmol/L (ref 98–111)
Creatinine, Ser: 1.4 mg/dL — ABNORMAL HIGH (ref 0.44–1.00)
GFR, Estimated: 39 mL/min — ABNORMAL LOW (ref >60)
Glucose, Bld: 108 mg/dL — ABNORMAL HIGH (ref 70–99)
Potassium: 4 mmol/L (ref 3.5–5.1)
Sodium: 137 mmol/L (ref 135–145)

## 2023-11-18 LAB — BRAIN NATRIURETIC PEPTIDE: B Natriuretic Peptide: 48 pg/mL (ref 0.0–100.0)

## 2023-11-18 NOTE — Patient Instructions (Signed)
 Medication Changes:  No medication changes today!  Lab Work:  Go over to the [facility]. Go pass the gift shop and have your blood work completed.  We will only call you if the results are abnormal or if the provider would like to make medication changes.   Thank you for coming in today  If you are having labs drawn today, any labs that are abnormal the clinic will call you. No news is good news.   Follow-Up in: Our Doctors' schedules are NOT open yet for _4_ months. We will place you on our recall list. Once they are available, we will call you to schedule your follow up appointment.    Thank you for choosing [facility].    At the [facility], you and your health needs are our priority. We have a designated team specialized in the treatment of Heart Failure. This Care Team includes your primary Heart Failure Specialized Cardiologist (physician), Advanced Practice Providers (APPs- Physician Assistants and Nurse Practitioners), and Pharmacist who all work together to provide you with the care you need, when you need it.   You may see any of the following providers on your designated Care Team at your next follow up:  Dr. Toribio Fuel Dr. Ezra Shuck Dr. Ria Commander Dr. Morene Brownie Ellouise Class, FNP Jaun Bash, RPH-CPP  Please be sure to bring in all your medications bottles to every appointment.   Need to Contact Us :  If you have any questions or concerns before your next appointment please send us  a message through [place] or call our office at [phone].    TO LEAVE A MESSAGE FOR THE NURSE SELECT OPTION 2, PLEASE LEAVE A MESSAGE INCLUDING: YOUR NAME DATE OF BIRTH CALL BACK NUMBER REASON FOR CALL**this is important as we prioritize the call backs  YOU WILL RECEIVE A CALL BACK THE SAME DAY AS LONG AS YOU CALL BEFORE 4:00 PM

## 2023-11-19 NOTE — Progress Notes (Signed)
 PCP: Bernardo Fend, DO Cardiology: Dr. Darron HF Cardiology: Dr. Rolan  Chief complaint: Pulmonary hypertension  78 y.o. with history of chronic hypoxemic respiratory failure, OSA, and chronic diastolic CHF was referred by Dr. Darron for evaluation of CHF and pulmonary hypertension.  Patient has been on home oxygen for 2-3 years per her report.  She was a smoker remotely. She had a DVT in 2018.  Anticoagulation was stopped due to refractory anemia and IVC filter was placed.  CT chest did not show a PE. She has OSA and uses CPAP.  Last echo in 1/21 showed normal LV EF with PASP 80 and severely dilated RV with mild RV dysfunction and D-shaped septum. She has been on Lasix  for diuresis due to RV failure.    RHC/LHC was done in 3/21, showing mild CAD; mean RA 12, PA 68/25 mean 42, mean PCWP 9, CI 4.1, PVR 4.13 WU.  V/Q scan in 3/21 did not show evidence for chronic PE.  High resolution chest CT in 3/21 did not show evidence for ILD, it did show mild-moderate emphysema. PFTs (3/21) showed moderate-severe COPD.   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   She was unable to tolerate Tyvaso due to intractable nausea and vomiting.   Echo 5/22 showed EF 60-65%, low normal RV function, mild RV enlargement, PASP 44 mmHg.  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   Echo in 8/23 showed EF 55-60%, RV mildly enlarged with normal systolic function, moderate LAE,  normal IVC, PASP 35 mmHg.   Echo in 7/24 showed EF 60-65%, normal RV, PASP 48 mmHg, mild MR.  Echo in 6/25 showed EF 60-65%, normal RV, unable to estimate PA systolic pressure, IVC normal.   She returns for followup of pulmonary hypertension and RV failure.  Wearing home oxygen, 4L at rest and 6L with exertion, uses Bipap at night. Weight is down 6 lbs.  Breathing is stable, no dyspnea walking on flat ground as long as she is using her oxygen.  No chest pain. No lightheadedness or presyncope.    6 minute walk  (3/21): 182 m 6 minute walk (5/21): 243 m, oxygen saturation dropped to as low as 70s 6 minute walk (7/21): Only walked 3 min bc oxygen saturation dropped to 70s and CMA stopped walk, she went 121 m.  6 minute walk (3/22): 298 m 6 minute walk (3/23): 366 m with her home oxygen.  6 minute walk (4/24): 427 m with home oxygen  Labs (4/23): K 4.2, creatinine 0.93, hgb 12 Labs (2/24): K 3.7, creatinine 1.12 Labs (4/24): BNP 98, LDL 63 Labs (5/24): K 4.1, creatinine 1.17 Labs (3/25): K 4, creatinine 1.21 Labs (5/25): BNP 58 Labs (6/25): K 4.3, creatinine 1.45  PMH: 1. COVID-19 PNA 8/20.  2. Type 2 diabetes 3. DVT: right leg, 2018.  - Has IVC filter, not anticoagulated due to h/o anemia.  4. HTN 5. Hyperlipidemia.  6. OSA: Uses Bipap.  7. COPD: Chronic hypoxemic respiratory failure, 3 L home oxygen.  8. Hyponatremia on HCTZ 9. Chronic diastolic CHF:  - Lexiscan  Cardiolite in 2/19 was low risk no ischemia.  - Echo (1/21): EF 65-70%, no LVH, trivial MR, PASP 80 mmHg, severe RV enlargement with mildly decreased RV systolic function, D-shaped interventricular septum, dilated IVC.  - CTA chest (8/18): No evidence for PE.  10. Pulmonary hypertension: RHC/LHC 3/21 with mild CAD; mean RA 12, PA 68/25 mean 42, mean PCWP 9, CI 4.1, PVR 4.13  WU.  - Serologic workup negative.  - V/Q scan (3/21): No chronic PE.  - HRCT (3/21): No ILD, mild-moderate COPD. - PFTs (3/21): moderate-severe COPD - Echo (5/22): EF 60-65%, low normal RV function, mild RV enlargement, PASP 44 mmHg - Echo (8/23): EF 55-60%, RV mildly enlarged with normal systolic function, moderate LAE,  normal IVC, PASP 35 mmHg.  - Echo (7/24): EF 60-65%, normal RV, PASP 48 mmHg, mild MR.  - Echo (6/25): EF 60-65%, normal RV, unable to estimate PA systolic pressure, IVC normal.  11. GI stromal tumor: Treated with Gleevec .  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  Social History   Socioeconomic History   Marital status: Single    Spouse  name: Not on file   Number of children: 2   Years of education: Not on file   Highest education level: Not on file  Occupational History   Occupation: CELL    Employer: RETIRED  Tobacco Use   Smoking status: Former    Current packs/day: 0.00    Average packs/day: 1.5 packs/day for 20.0 years (30.0 ttl pk-yrs)    Types: Cigarettes    Start date: 60    Quit date: 1999    Years since quitting: 26.6   Smokeless tobacco: Never  Vaping Use   Vaping status: Never Used  Substance and Sexual Activity   Alcohol use: No    Alcohol/week: 0.0 standard drinks of alcohol   Drug use: No   Sexual activity: Not Currently  Other Topics Concern   Not on file  Social History Narrative   Independent at baseline. Lives at home with her brother; in [place]. Quit smoking 20 years; no alcohol.    Social Drivers of Corporate investment banker Strain: Not on file  Food Insecurity: Not on file  Transportation Needs: Not on file  Physical Activity: Not on file  Stress: Not on file  Social Connections: Not on file  Intimate Partner Violence: Not on file   Family History  Problem Relation Age of Onset   Prostate cancer Father    Other Sister        mouth cancer   Hypertension Mother    ROS: All systems reviewed and negative except as per HPI.   Current Outpatient Medications  Medication Sig Dispense Refill   acetaminophen  (TYLENOL ) 325 MG tablet Take 2 tablets (650 mg total) by mouth every 6 (six) hours as needed for mild pain (or Fever >/= 101).     albuterol  (VENTOLIN  HFA) 108 (90 Base) MCG/ACT inhaler Inhale 2 puffs into the lungs every 6 (six) hours as needed for wheezing or shortness of breath. 8 g 0   aspirin  81 MG chewable tablet Chew 1 tablet (81 mg total) by mouth daily. 30 tablet 0   clobetasol  cream (TEMOVATE ) 0.05 % Apply 1 Application topically 3 (three) times a week.     Continuous Glucose Sensor (FREESTYLE LIBRE 3 PLUS SENSOR) MISC Change sensor every 15 days. 2 each 6    dorzolamide-timolol  (COSOPT) 2-0.5 % ophthalmic solution 1 drop 2 (two) times daily.     FARXIGA  10 MG TABS tablet Take 1 tablet by mouth once daily 90 tablet 3   ferrous gluconate  (FERGON) 325 MG tablet Take 325 mg by mouth every other day.     furosemide  (LASIX ) 40 MG tablet Take 1 tablet (40 mg total) by mouth every morning AND 0.5 tablets (20 mg total) every evening. 270 tablet 3   HUMALOG KWIKPEN 100 UNIT/ML KwikPen Inject  into the skin.     imatinib  (GLEEVEC ) 100 MG tablet TAKE 4 TABLETS (400 MG TOTAL) BY MOUTH DAILY. TAKE WITH MEALS AND LARGE GLASS OF WATER 120 tablet 2   insulin  glargine (LANTUS ) 100 UNIT/ML injection Inject 32 Units into the skin in the morning.     latanoprost  (XALATAN ) 0.005 % ophthalmic solution Place 1 drop into both eyes at bedtime.      loperamide (IMODIUM) 2 MG capsule Take by mouth as needed for diarrhea or loose stools.     lovastatin (MEVACOR) 20 MG tablet Take 20 mg by mouth every evening.      metFORMIN  (GLUCOPHAGE ) 1000 MG tablet Take 1,000 mg by mouth daily.      Multiple Vitamin (MULTIVITAMIN) tablet Take 1 tablet by mouth daily.     omeprazole  (PRILOSEC) 40 MG capsule Take 40 mg by mouth daily.     OPSUMIT  10 MG tablet Take 1 tablet (10 mg total) by mouth daily. 90 tablet 3   OXYGEN Inhale into the lungs. [employer]     potassium chloride  SA (KLOR-CON  M) 20 MEQ tablet Take 2 tablets (40 mEq total) by mouth daily. 180 tablet 3   tadalafil , PAH, (ADCIRCA ) 20 MG tablet TAKE 2 TABLETS DAILY 60 tablet 3   timolol  (TIMOPTIC ) 0.5 % ophthalmic solution Place 1 drop into both eyes 2 (two) times daily.      Tiotropium Bromide-Olodaterol (STIOLTO RESPIMAT ) 2.5-2.5 MCG/ACT AERS INHALE 2 PUFFS BY MOUTH ONCE DAILY 4 g 11   No current facility-administered medications for this visit.   BP (!) 132/53   Pulse 81   Wt 210 lb (95.3 kg)   SpO2 (!) 86% Comment: 6 Liters  BMI 34.95 kg/m  General: NAD Neck: No JVD, no thyromegaly or thyroid nodule.  Lungs: Distant  breath sounds.  CV: Nondisplaced PMI.  Heart regular S1/S2, no S3/S4, no murmur.  1+ ankle edema.  No carotid bruit.  Normal pedal pulses.  Abdomen: Soft, nontender, no hepatosplenomegaly, no distention.  Skin: Intact without lesions or rashes.  Neurologic: Alert and oriented x 3.  Psych: Normal affect. Extremities: No clubbing or cyanosis.  HEENT: Normal.   Assessment/Plan: 1. RV failure: Echo in 1/21 showed significant RV dysfunction with severe pulmonary hypertension and D-shaped interventricular septum.  Based on RHC, it looks like RV failure is due to pulmonary arterial hypertension rather than LV failure (normal PCWP).  Repeated echo in 5/22 showed EF 60-65%, low normal RV function, mild RV enlargement, PASP 44 mmHg. Echo in 8/23 showed EF 55-60%, RV mildly enlarged with normal systolic function, moderate LAE, normal IVC, PASP 35 mmHg.  Echo in 6/25 showed EF 60-65%, normal RV, unable to estimate PA systolic pressure, IVC normal.  NYHA class II-III, she does not look volume overloaded on exam.  - Continue Lasix  40 qam/20 qpm.  BMET/BNP today.  - Continue Farxiga .  2. Pulmonary HTN: Echo in 1/21 with RV failure and estimate PA systolic pressure 80 mmHg.  RHC in 3/21 showed pulmonary arterial hypertension.  Serologic workup was negative.  V/Q scan showed no chronic PE.  HRCT showed no ILD, mild-moderate emphysema.  PFTs suggestive of moderate COPD. Pulmonary hypertension seems out of proportion to parenchymal disease, possible significant group 1 component to her PH in addition to group 3 (COPD).  I reviewed echo from 6/25, RV looked normal and unable to estimate PA systolic pressure. She has been symptomatically improved with selective pulmonary vasodilators.  She was unable to tolerate Tyvaso.  - We  have not set up 6 minute walk course at this office yet, will need to do in future.  - Continue to use Bipap at night, oxygen during the day.  - Continue Opsumit  10 mg daily.  - Continue tadalafil   40 mg daily.  - Will not add additional PH meds at this time with mixed picture, probably predominantly WHO group 3 PH (COPD).   3. H/o DVT: 2018.  She has IVC filter and is not anticoagulated due to h/o anemia.  4. Type 2 diabetes: She is on Farxiga .  5. Chronic hypoxemic respiratory failure: Home oxygen.  Moderate COPD on PFTs, mild-moderate emphysema on HRCT.  Suspect pulmonary arterial hypertension also plays a role.  - Continue home oxygen.  Followup in 4 months.     I spent 22 minutes reviewing records, interviewing/examining patient, and managing orders.   Ezra Shuck 11/19/2023

## 2023-11-24 ENCOUNTER — Telehealth: Payer: Self-pay | Admitting: Internal Medicine

## 2023-11-24 NOTE — Telephone Encounter (Signed)
 Copied from CRM [id number]. Topic: Clinical - Medical Advice >> Nov 24, 2023  3:42 PM Donee H wrote: Reason for CRM: Carly from [facility] called to let patient's pcp know she has an at home glucose  machine and checks bloosd suger regularly.  She only had one day where blood sugar dipped into the 60s. Patient was aware of what to do so she ate something. Carly just want to update pcp and doesn't know if insulin  needs to be changed. Please follow up with patient. She did state if any questions she can be reach at  [phone]

## 2023-11-25 NOTE — Telephone Encounter (Signed)
 FYI

## 2023-11-27 ENCOUNTER — Other Ambulatory Visit: Payer: Self-pay

## 2023-11-27 ENCOUNTER — Other Ambulatory Visit (HOSPITAL_COMMUNITY): Payer: Self-pay | Admitting: Cardiology

## 2023-11-27 DIAGNOSIS — I272 Pulmonary hypertension, unspecified: Secondary | ICD-10-CM

## 2023-11-30 ENCOUNTER — Other Ambulatory Visit: Payer: Self-pay

## 2023-11-30 ENCOUNTER — Other Ambulatory Visit: Payer: Self-pay | Admitting: Pharmacy Technician

## 2023-11-30 NOTE — Progress Notes (Signed)
 Specialty Pharmacy Refill Coordination Note  Susan  Brianda Barajas is a 77 y.o. female contacted today regarding refills of specialty medication(s) Imatinib  Mesylate (GLEEVEC )   Patient requested Delivery   Delivery date: 12/04/23   Verified address: [address]  [facility] [place]   Medication will be filled on 12/03/23.

## 2023-12-01 ENCOUNTER — Other Ambulatory Visit: Payer: Self-pay

## 2023-12-02 ENCOUNTER — Other Ambulatory Visit: Payer: Self-pay

## 2023-12-09 ENCOUNTER — Encounter: Payer: Self-pay | Admitting: Internal Medicine

## 2023-12-09 ENCOUNTER — Ambulatory Visit (INDEPENDENT_AMBULATORY_CARE_PROVIDER_SITE_OTHER): Admitting: Internal Medicine

## 2023-12-09 VITALS — BP 110/60 | HR 78 | Temp 98.9°F | Ht 64.0 in | Wt 209.8 lb

## 2023-12-09 DIAGNOSIS — G4733 Obstructive sleep apnea (adult) (pediatric): Secondary | ICD-10-CM

## 2023-12-09 DIAGNOSIS — R0689 Other abnormalities of breathing: Secondary | ICD-10-CM

## 2023-12-09 DIAGNOSIS — J449 Chronic obstructive pulmonary disease, unspecified: Secondary | ICD-10-CM | POA: Diagnosis not present

## 2023-12-09 DIAGNOSIS — J9611 Chronic respiratory failure with hypoxia: Secondary | ICD-10-CM

## 2023-12-09 DIAGNOSIS — I272 Pulmonary hypertension, unspecified: Secondary | ICD-10-CM

## 2023-12-09 DIAGNOSIS — I5032 Chronic diastolic (congestive) heart failure: Secondary | ICD-10-CM

## 2023-12-09 NOTE — Assessment & Plan Note (Signed)
 Follow-up cardiology

## 2023-12-09 NOTE — Assessment & Plan Note (Addendum)
 Moderate COPD FEV1 59% predicted Continue inhalers as prescribed-Stiolto inhaler No exacerbation at this time No evidence of heart failure at this time No evidence or signs of infection at this time No respiratory distress No fevers, chills, nausea, vomiting, diarrhea No evidence of lower extremity edema No evidence hemoptysis

## 2023-12-09 NOTE — Assessment & Plan Note (Addendum)
 Assessment of OSA Continue CPAP as prescribed  Excellent compliance report Reviewed compliance report in detail with patient Patient definitely benefits the use of CPAP therapy as prescribed Using CPAP nightly and with naps Pressure setting is comfortable and is sleeping well. IPAP 18 EPAP 13 AHI reduced to 0.9 100% compliance for days and greater than 4 hours Compliance report reviewed in detail with patient excellent compliance report No evidence of acute heart failure at this time No respiratory distress No fevers, chills, nausea, vomiting, diarrhea No evidence hemoptysis  Patient Instructions Continue to use CPAP every night, minimum of 4-6 hours a night.  Change equipment every 30 days or as directed by DME.  Wash your tubing with warm soap and water daily, hang to dry. Wash humidifier portion weekly. Use bottled, distilled water and change daily   Be aware of reduced alertness and do not drive or operate heavy machinery if experiencing this or drowsiness.  Exercise encouraged, as tolerated. Encouraged proper weight management.  Important to get eight or more hours of sleep  Limiting the use of the computer and television before bedtime.  Decrease naps during the day, so night time sleep will become enhanced.  Limit caffeine, and sleep deprivation.  HTN, stroke, uncontrolled diabetes and heart failure are potential risk factors.  Risk of untreated sleep apnea including cardiac arrhthymias, stroke, DM, pulm HTN.   Nocturnal BiPAP BiPAP 18/13 AHI reduced 0.5 Patient is a benefits from therapy

## 2023-12-09 NOTE — Progress Notes (Signed)
 @Patient  ID: Susan Barajas  Susan Barajas, female    DOB: 1945-08-12, 78 y.o.   MRN: [id number]     SYNOPSIS 78 year old female former smoker followed for severe obstructive sleep apnea on nocturnal BiPAP and chronic respiratory failure on oxygen, COPD .  She has underlying pulmonary hypertension secondary to underlying sleep apnea, diastolic heart failure and cor pulmonale.  She is followed by cardiology and is on Opsumit  and tadalafil  COVID-19 infection with COVID-pneumonia in September 2020 Previous DVT in 2018 was on anticoagulation but had to be stopped due to refractory anemia with IVC filter placed  TEST/EVENTS :  RHC/LHC was done in 3/21, showing mild CAD; mean RA 12, PA 68/25 mean 42, mean PCWP 9, CI 4.1, PVR 4.13 WU.  V/Q scan in 3/21 did not show evidence for chronic PE.   High resolution chest CT in 3/21 did not show evidence for ILD, it did show mild-moderate emphysema.     pulmonary function testing in March 2021 showed moderate airflow obstruction with an FEV1 at 66%, ratio 56, FVC 91%, positive bronchodilator response.  Significant mid flow obstruction and reversibility.  DLCO 32%.  PET scan 09/2020 -Slight decrease in size of a left upper quadrant mass which demonstrates similar mild hypermetabolism. 2. A hypermetabolic ileocolic mesenteric node is slightly increased in size since the prior exam, suspicious metastatic disease. This could alternatively be reactive, given the appearance of the colon. 3. Chronic sigmoid wall thickening and diffuse colonic hypermetabolism in the setting of marked diverticulosis. Likely muscular hypertrophy and chronic diverticulitis. Correlate with any symptoms to suggest acute superimposed inflammation. Also correlate with colon cancer screening history to exclude underlying neoplasm.   CC  Follow-up assessment for COPD Follow-up assessment for OSA Follow-up assessment for chronic hypoxic respiratory failure Follow-up assessment for pulmonary  hypertension   HPI Regarding COPD Respiratory insufficiency seems to be stable No exacerbation at this time No evidence of heart failure at this time No evidence or signs of infection at this time No respiratory distress No fevers, chills, nausea, vomiting, diarrhea No evidence of lower extremity edema No evidence hemoptysis  Placed on [facility] August 2021 Continues to do well with this inhaler Patient with poor respiratory insufficiency Continues to use oxygen as prescribed  Regarding chronic hypoxic respiratory failure No significant changes over the last 1 year Short winded with prolonged walking Oxygen 4 L at home Is a portable oxygen concentrator Denies any recent increased oxygen demands   Discussed sleep data and reviewed with patient.  Encouraged proper weight management.  Discussed driving precautions and its relationship with hypersomnolence.  Discussed sleep hygiene, and benefits of a fixed sleep waked time.  The importance of getting eight or more hours of sleep discussed with patient.  Discussed limiting the use of the computer and television before bedtime.  Decrease naps during the day, so night time sleep will become enhanced.  Limit caffeine, and sleep deprivation.   Patient uses and benefits from therapy Using CPAP nightly and with naps Pressure setting is comfortable and is sleeping well.   Patient says that this has really helped her feels that the inhaler has help with her breathing.   No increased albuterol  use.   Assessment of OSA Patient currently on BiPAP at bedtime CPAP download previously has shown excellent compliance Current download shows significant improvement of AHI reduced to 0.5 BiPAP 18/13 100% compliance Download reviewed in detail with patient     Regarding pulmonary hypertension She is followed by cardiology for primary hypertension Follow-up echo  shows improvement of pulmonary artery pressures at 43 back in May  2022 Repeat echocardiogram 2024 shows moderate elevation of pulmonary artery pressure Since her shortness of breath has improved with these medications denies any orthopnea or swelling  Oncology history  Patient has been diagnosed recently with a gastrointestinal stromal tumor.  She has been followed by oncology.  Is currently on Gleevec .        No Known Allergies  Immunization History  Administered Date(s) Administered   Fluad Quad(high Dose 65+) 03/14/2019   Influenza Split 01/25/2018   Influenza-Unspecified 01/09/2020, 02/24/2022   Moderna Sars-Covid-2 Vaccination 05/27/2019, 06/27/2019   Pneumococcal Polysaccharide-23 04/13/2019    Past Medical History:  Diagnosis Date   Anemia    Asthma 1950   CHF (congestive heart failure) (HCC)    COPD (chronic obstructive pulmonary disease) (HCC)    Cor pulmonale (HCC)    DVT (deep venous thrombosis) (HCC) 2018   GERD (gastroesophageal reflux disease)    GIST (gastrointestinal stromal tumor) of small bowel, malignant (HCC)    Glaucoma 1949   Hyperlipidemia    Hypertension    Obesity, unspecified    OSA treated with BiPAP    Personal history of tobacco use, presenting hazards to health    Pneumonia    Presence of IVC filter    Pulmonary HTN (HCC)    Supplemental oxygen dependent    T2DM (type 2 diabetes mellitus) (HCC)    Varicose veins of lower extremities with other complications     Tobacco History: Social History   Tobacco Use  Smoking Status Former   Current packs/day: 0.00   Average packs/day: 1.5 packs/day for 20.0 years (30.0 ttl pk-yrs)   Types: Cigarettes   Start date: 86   Quit date: 1999   Years since quitting: 26.6  Smokeless Tobacco Never   Counseling given: Not Answered    Outpatient Medications Prior to Visit  Medication Sig Dispense Refill   acetaminophen  (TYLENOL ) 325 MG tablet Take 2 tablets (650 mg total) by mouth every 6 (six) hours as needed for mild pain (or Fever >/= 101).     albuterol   (VENTOLIN  HFA) 108 (90 Base) MCG/ACT inhaler Inhale 2 puffs into the lungs every 6 (six) hours as needed for wheezing or shortness of breath. 8 g 0   aspirin  81 MG chewable tablet Chew 1 tablet (81 mg total) by mouth daily. 30 tablet 0   clobetasol  cream (TEMOVATE ) 0.05 % Apply 1 Application topically 3 (three) times a week.     Continuous Glucose Sensor (FREESTYLE LIBRE 3 PLUS SENSOR) MISC Change sensor every 15 days. 2 each 6   dorzolamide-timolol  (COSOPT) 2-0.5 % ophthalmic solution 1 drop 2 (two) times daily.     FARXIGA  10 MG TABS tablet Take 1 tablet by mouth once daily 90 tablet 3   ferrous gluconate  (FERGON) 325 MG tablet Take 325 mg by mouth every other day.     furosemide  (LASIX ) 40 MG tablet Take 1 tablet (40 mg total) by mouth every morning AND 0.5 tablets (20 mg total) every evening. 270 tablet 3   HUMALOG KWIKPEN 100 UNIT/ML KwikPen Inject into the skin.     imatinib  (GLEEVEC ) 100 MG tablet TAKE 4 TABLETS (400 MG TOTAL) BY MOUTH DAILY. TAKE WITH MEALS AND LARGE GLASS OF WATER 120 tablet 2   insulin  glargine (LANTUS ) 100 UNIT/ML injection Inject 32 Units into the skin in the morning.     latanoprost  (XALATAN ) 0.005 % ophthalmic solution Place 1 drop into both  eyes at bedtime.      loperamide (IMODIUM) 2 MG capsule Take by mouth as needed for diarrhea or loose stools.     lovastatin (MEVACOR) 20 MG tablet Take 20 mg by mouth every evening.      metFORMIN  (GLUCOPHAGE ) 1000 MG tablet Take 1,000 mg by mouth daily.      Multiple Vitamin (MULTIVITAMIN) tablet Take 1 tablet by mouth daily.     omeprazole  (PRILOSEC) 40 MG capsule Take 40 mg by mouth daily.     OPSUMIT  10 MG tablet Take 1 tablet (10 mg total) by mouth daily. 90 tablet 3   OXYGEN Inhale into the lungs. [employer]     potassium chloride  SA (KLOR-CON  M) 20 MEQ tablet Take 2 tablets (40 mEq total) by mouth daily. 180 tablet 3   tadalafil , PAH, (ADCIRCA ) 20 MG tablet TAKE 2 TABLETS DAILY 60 tablet 3   timolol  (TIMOPTIC ) 0.5 %  ophthalmic solution Place 1 drop into both eyes 2 (two) times daily.      Tiotropium Bromide-Olodaterol (STIOLTO RESPIMAT ) 2.5-2.5 MCG/ACT AERS INHALE 2 PUFFS BY MOUTH ONCE DAILY 4 g 11   No facility-administered medications prior to visit.    BP 110/60   Pulse 78   Temp 98.9 F (37.2 C)   Ht 5' 4 (1.626 m)   Wt 209 lb 12.8 oz (95.2 kg)   SpO2 (!) 86% Comment: talking and on 6L of O2  BMI 36.01 kg/m     Physical Examination:   General Appearance: No distress  EYES PERRLA, EOM intact.   NECK Supple, No JVD Pulmonary: normal breath sounds, No wheezing.  CardiovascularNormal S1,S2.  No m/r/g.   Abdomen: Benign, Soft, non-tender. Neurology UE/LE 5/5 strength, no focal deficits Ext pulses intact, cap refill intact ALL OTHER ROS ARE NEGATIVE   Lab Results:  CBC    Component Value Date/Time   WBC 6.0 09/15/2023 1356   WBC 5.4 06/03/2022 1311   RBC 3.74 (L) 09/15/2023 1356   HGB 11.3 (L) 09/15/2023 1356   HCT 35.3 (L) 09/15/2023 1356   HCT 27.1 (L) 11/25/2016 2153   PLT 224 09/15/2023 1356   MCV 94.4 09/15/2023 1356   MCH 30.2 09/15/2023 1356   MCHC 32.0 09/15/2023 1356   RDW 14.0 09/15/2023 1356   LYMPHSABS 0.9 09/15/2023 1356   MONOABS 0.5 09/15/2023 1356   EOSABS 0.1 09/15/2023 1356   BASOSABS 0.0 09/15/2023 1356    BMET    Component Value Date/Time   NA 137 11/18/2023 1608   NA 137 08/14/2023 1402   K 4.0 11/18/2023 1608   CL 103 11/18/2023 1608   CO2 26 11/18/2023 1608   GLUCOSE 108 (H) 11/18/2023 1608   BUN 23 11/18/2023 1608   BUN 17 08/14/2023 1402   CREATININE 1.40 (H) 11/18/2023 1608   CREATININE 1.45 (H) 09/15/2023 1356   CALCIUM  8.7 (L) 11/18/2023 1608   GFRNONAA 39 (L) 11/18/2023 1608   GFRNONAA 37 (L) 09/15/2023 1356   GFRAA >60 10/28/2019 1213    BNP    Component Value Date/Time   BNP 48.0 11/18/2023 1608    ProBNP    Component Value Date/Time   PROBNP 819.0 (H) 04/13/2019 1712         Latest Ref Rng & Units 07/13/2019     1:18 PM  PFT Results  FVC-Pre L 2.04   FVC-Predicted Pre % 82   FVC-Post L 2.26   FVC-Predicted Post % 91   Pre FEV1/FVC % % 56  Post FEV1/FCV % % 56   FEV1-Pre L 1.14   FEV1-Predicted Pre % 59   FEV1-Post L 1.28   DLCO uncorrected ml/min/mmHg 6.67   DLCO UNC% % 32   DLVA Predicted % 49   TLC L 5.01   TLC % Predicted % 93   RV % Predicted % 127   PFTs reviewed in detail with patient from 2021 FEV1 FVC ratio is 56% predicted FEV1 is 59% predicted DLCO is significantly reduced No significant bronchodilator response      Assessment & Plan:   78 year old pleasant African-American female seen today for follow-up assessment for multiple medical issues including end-stage COPD with chronic hypoxic respiratory failure with diastolic heart failure with pulmonary hypertension likely related to OSA and COPD and emphysema associated with chronic hypoxic respiratory failure on oxygen therapy   Assessment & Plan Chronic obstructive pulmonary disease, unspecified COPD type (HCC) Moderate COPD FEV1 59% predicted Continue inhalers as prescribed-Stiolto inhaler No exacerbation at this time No evidence of heart failure at this time No evidence or signs of infection at this time No respiratory distress No fevers, chills, nausea, vomiting, diarrhea No evidence of lower extremity edema No evidence hemoptysis  OSA (obstructive sleep apnea) Assessment of OSA Continue CPAP as prescribed  Excellent compliance report Reviewed compliance report in detail with patient Patient definitely benefits the use of CPAP therapy as prescribed Using CPAP nightly and with naps Pressure setting is comfortable and is sleeping well. IPAP 18 EPAP 13 AHI reduced to 0.9 100% compliance for days and greater than 4 hours Compliance report reviewed in detail with patient excellent compliance report No evidence of acute heart failure at this time No respiratory distress No fevers, chills, nausea, vomiting,  diarrhea No evidence hemoptysis  Patient Instructions Continue to use CPAP every night, minimum of 4-6 hours a night.  Change equipment every 30 days or as directed by DME.  Wash your tubing with warm soap and water daily, hang to dry. Wash humidifier portion weekly. Use bottled, distilled water and change daily   Be aware of reduced alertness and do not drive or operate heavy machinery if experiencing this or drowsiness.  Exercise encouraged, as tolerated. Encouraged proper weight management.  Important to get eight or more hours of sleep  Limiting the use of the computer and television before bedtime.  Decrease naps during the day, so night time sleep will become enhanced.  Limit caffeine, and sleep deprivation.  HTN, stroke, uncontrolled diabetes and heart failure are potential risk factors.  Risk of untreated sleep apnea including cardiac arrhthymias, stroke, DM, pulm HTN.   Nocturnal BiPAP BiPAP 18/13 AHI reduced 0.5 Patient is a benefits from therapy  Respiratory insufficiency Continue oxygen as prescribed Chronic respiratory failure with hypoxia (HCC) Chronic hypoxic respiratory failure Continue oxygen as needed O2 sat goals 88 to 92% Patient use and benefits from therapy Needs this for survival Pulmonary HTN (HCC) Treat underling COPD and OSA Follow-up with cardiology Continue current regimen Repeat echo annually and as needed Continue oxygen Chronic diastolic heart failure (HCC) Follow up cardiology    MEDICATION ADJUSTMENTS/LABS AND TESTS ORDERED:    CURRENT MEDICATIONS REVIEWED AT LENGTH WITH PATIENT TODAY   Patient  satisfied with Plan of action and management. All questions answered   Follow up 6 months   I spent a total of 45 minutes dedicated to the care of this patient on the date of this encounter to include pre-visit review of records, face-to-face time with the patient discussing conditions  above, post visit ordering of testing, clinical  documentation with the electronic health record, making appropriate referrals as documented, and communicating necessary information to the patient's healthcare team.    The Patient requires high complexity decision making for assessment and support, frequent evaluation and titration of therapies, application of advanced monitoring technologies and extensive interpretation of multiple databases.  Patient satisfied with Plan of action and management. All questions answered    Nickolas Alm Cellar, M.D.  [facility]  Medical Director [facility] Medical Director [facility] Cardio-Pulmonary Department

## 2023-12-09 NOTE — Assessment & Plan Note (Signed)
 Chronic hypoxic respiratory failure Continue oxygen as needed O2 sat goals 88 to 92% Patient use and benefits from therapy Needs this for survival

## 2023-12-09 NOTE — Patient Instructions (Signed)
 Excellent Job A+ GOLD STAR!!  Continue CPAP as prescribed  Patient Instructions Continue to use CPAP every night, minimum of 4-6 hours a night.  Change equipment every 30 days or as directed by DME.  Wash your tubing with warm soap and water daily, hang to dry. Wash humidifier portion weekly. Use bottled, distilled water and change daily   Be aware of reduced alertness and do not drive or operate heavy machinery if experiencing this or drowsiness.  Exercise encouraged, as tolerated. Encouraged proper weight management.  Important to get eight or more hours of sleep  Limiting the use of the computer and television before bedtime.  Decrease naps during the day, so night time sleep will become enhanced.  Limit caffeine, and sleep deprivation.    Avoid Allergens and Irritants Avoid secondhand smoke Avoid SICK contacts Recommend  Masking  when appropriate Recommend Keep up-to-date with vaccinations  Continue oxygen as prescribed Continue inhalers as prescribed

## 2023-12-16 ENCOUNTER — Ambulatory Visit
Admission: RE | Admit: 2023-12-16 | Discharge: 2023-12-16 | Disposition: A | Discharge status: Home / Self Care | Source: Ambulatory Visit | Attending: Internal Medicine | Admitting: Internal Medicine

## 2023-12-16 DIAGNOSIS — C49A3 Gastrointestinal stromal tumor of small intestine: Secondary | ICD-10-CM | POA: Diagnosis present

## 2023-12-24 ENCOUNTER — Other Ambulatory Visit: Payer: Self-pay

## 2023-12-28 ENCOUNTER — Ambulatory Visit

## 2023-12-29 ENCOUNTER — Other Ambulatory Visit: Payer: Self-pay

## 2023-12-29 ENCOUNTER — Inpatient Hospital Stay (HOSPITAL_BASED_OUTPATIENT_CLINIC_OR_DEPARTMENT_OTHER): Admitting: Internal Medicine

## 2023-12-29 ENCOUNTER — Encounter: Payer: Self-pay | Admitting: Internal Medicine

## 2023-12-29 ENCOUNTER — Inpatient Hospital Stay: Attending: Internal Medicine

## 2023-12-29 VITALS — BP 94/58 | HR 67 | Temp 98.6°F | Resp 16 | Ht 64.0 in | Wt 209.4 lb

## 2023-12-29 DIAGNOSIS — M7989 Other specified soft tissue disorders: Secondary | ICD-10-CM | POA: Diagnosis not present

## 2023-12-29 DIAGNOSIS — D509 Iron deficiency anemia, unspecified: Secondary | ICD-10-CM | POA: Diagnosis not present

## 2023-12-29 DIAGNOSIS — J449 Chronic obstructive pulmonary disease, unspecified: Secondary | ICD-10-CM | POA: Insufficient documentation

## 2023-12-29 DIAGNOSIS — I509 Heart failure, unspecified: Secondary | ICD-10-CM | POA: Diagnosis not present

## 2023-12-29 DIAGNOSIS — R197 Diarrhea, unspecified: Secondary | ICD-10-CM | POA: Diagnosis not present

## 2023-12-29 DIAGNOSIS — N183 Chronic kidney disease, stage 3 unspecified: Secondary | ICD-10-CM | POA: Diagnosis not present

## 2023-12-29 DIAGNOSIS — C49A3 Gastrointestinal stromal tumor of small intestine: Secondary | ICD-10-CM | POA: Insufficient documentation

## 2023-12-29 LAB — CBC WITH DIFFERENTIAL (CANCER CENTER ONLY)
Abs Immature Granulocytes: 0.02 K/uL (ref 0.00–0.07)
Basophils Absolute: 0 K/uL (ref 0.0–0.1)
Basophils Relative: 0 %
Eosinophils Absolute: 0 K/uL (ref 0.0–0.5)
Eosinophils Relative: 0 %
HCT: 31.6 % — ABNORMAL LOW (ref 36.0–46.0)
Hemoglobin: 10.2 g/dL — ABNORMAL LOW (ref 12.0–15.0)
Immature Granulocytes: 0 %
Lymphocytes Relative: 19 %
Lymphs Abs: 1.1 K/uL (ref 0.7–4.0)
MCH: 30.4 pg (ref 26.0–34.0)
MCHC: 32.3 g/dL (ref 30.0–36.0)
MCV: 94.3 fL (ref 80.0–100.0)
Monocytes Absolute: 0.6 K/uL (ref 0.1–1.0)
Monocytes Relative: 9 %
Neutro Abs: 4.4 K/uL (ref 1.7–7.7)
Neutrophils Relative %: 72 %
Platelet Count: 252 K/uL (ref 150–400)
RBC: 3.35 MIL/uL — ABNORMAL LOW (ref 3.87–5.11)
RDW: 14.6 % (ref 11.5–15.5)
WBC Count: 6.1 K/uL (ref 4.0–10.5)
nRBC: 0 % (ref 0.0–0.2)

## 2023-12-29 LAB — CMP (CANCER CENTER ONLY)
ALT: 14 U/L (ref 0–44)
AST: 22 U/L (ref 15–41)
Albumin: 3.9 g/dL (ref 3.5–5.0)
Alkaline Phosphatase: 45 U/L (ref 38–126)
Anion gap: 8 (ref 5–15)
BUN: 18 mg/dL (ref 8–23)
CO2: 25 mmol/L (ref 22–32)
Calcium: 8.8 mg/dL — ABNORMAL LOW (ref 8.9–10.3)
Chloride: 102 mmol/L (ref 98–111)
Creatinine: 1.25 mg/dL — ABNORMAL HIGH (ref 0.44–1.00)
GFR, Estimated: 44 mL/min — ABNORMAL LOW (ref >60)
Glucose, Bld: 92 mg/dL (ref 70–99)
Potassium: 4.2 mmol/L (ref 3.5–5.1)
Sodium: 135 mmol/L (ref 135–145)
Total Bilirubin: 0.5 mg/dL (ref 0.0–1.2)
Total Protein: 7.1 g/dL (ref 6.5–8.1)

## 2023-12-29 LAB — IRON AND TIBC
Iron: 57 ug/dL (ref 28–170)
Saturation Ratios: 16 % (ref 10.4–31.8)
TIBC: 364 ug/dL (ref 250–450)
UIBC: 307 ug/dL

## 2023-12-29 MED ORDER — IMATINIB MESYLATE 100 MG PO TABS
ORAL_TABLET | ORAL | 2 refills | Status: DC
  Filled 2023-12-29: qty 120, fill #0
  Filled 2024-01-01: qty 120, 30d supply, fill #0
  Filled 2024-01-29 – 2024-02-01 (×2): qty 120, 30d supply, fill #1
  Filled 2024-02-25: qty 120, 30d supply, fill #2

## 2023-12-29 NOTE — Progress Notes (Signed)
 CT abd/pelvis 12/16/23.

## 2023-12-29 NOTE — Progress Notes (Signed)
 [facility] CONSULT NOTE  Patient Care Team: Bernardo Fend, DO as PCP - General (Internal Medicine) Darron Deatrice LABOR, MD as PCP - Cardiology (Cardiology) Dellie Louanne MATSU, MD (General Surgery) Corlis Honor BROCKS, MD (Internal Medicine) Jama, Cordella MATSU, MD (Vascular Surgery) Dianna Specking, MD as Consulting Physician (Gastroenterology) Rennie Cindy SAUNDERS, MD as Consulting Physician (Hematology and Oncology)  CHIEF COMPLAINTS/PURPOSE OF CONSULTATION: GIST   HEMATOLOGY HISTORY  Oncology History Overview Note  NOV 2021- CT Bx- TUMOR  Tumor Site: Peritoneum, jejunal  Histologic Type: Gastrointestinal stromal tumor, spindle cell type  Mitotic Rate: 1 per 5 mm squared  Histologic Grade: G1, low grade (mitotic rate less than or equal to 5  per 5 mm squared)- STAGE T2N1 [possible mesneteric LN]; CT A/P-9.8 x9.8 cm  # NOV 29th 2021- GLEEVEC  400 mg/day neoadjuvant therapy 6 months.   SURGICAL PATHOLOGY  CASE: [id number]  PATIENT: Glender  Welter  Surgical Pathology Report      Specimen Submitted:  A. Lymph node, mesenteric  B. Lymph node, Proximal mesenteric  C. Small bowel with GIST   Clinical History: GIST small bowel       DIAGNOSIS:  A. LYMPH NODE, MESENTERIC; EXCISIONAL BIOPSY:  - REACTIVE LYMPHOID FOLLICULAR HYPERPLASIA WITH PROGRESSIVE  TRANSFORMATION OF GERMINAL CENTERS.  - NEGATIVE FOR MALIGNANCY, ONE LYMPH NODE (0/1).   B. LYMPH NODE, PROXIMAL MESENTERIC; EXCISIONAL BIOPSY:  - REACTIVE LYMPHOID FOLLICULAR HYPERPLASIA.  - NEGATIVE FOR MALIGNANCY, TWO LYMPH NODES (0/2).   C. SMALL BOWEL WITH GIST; RESECTION:  - GASTROINTESTINAL STROMAL TUMOR.  - SEE CANCER SUMMARY BELOW.   CASE SUMMARY: (GASTROINTESTINAL STROMAL TUMOR (GIST): Resection)  Standard(s): AJCC-UICC 8   CLINICAL  Pre-resection Treatment: Previous biopsy, systemic therapy performed  (Gleevec )   SPECIMEN  Procedure: Resection (partial small bowel)   TUMOR   Tumor Focality:  Unifocal  Tumor Site: Jejunum  Tumor Size: 9.7 x 9.2 x 6.3 cm  Histologic Type: Gastrointestinal stromal tumor, spindle cell type  Mitotic Rate:  Specify mitotic rate per 5 mm squared: Less than 5 per 5  mm squared  Histologic Grade: G1, low grade  Necrosis: Not identified  Treatment Effect: Present, approximately 30% viable tumor remains  Risk Assessment: Moderate (24%) risk of progressive disease   MARGINS:  Margin status:  Margin(s) involved by GIST: Deep, see comment.   Comment:  Tumor is present on ink on the periphery of the tumor nodule.  The  nature of the specimen precludes accurate assessment of where the tumor  may have been attached in situ (apart from the small bowel attachment).  Proximal and distal bowel margins cannot be reliably assessed, given  that they were received detached from the tumor specimen.   REGIONAL LYMPH NODES  Regional lymph node status:  Regional lymph nodes present, all regional lymph nodes negative for  tumor.  Number of lymph nodes examined: Three    # CHRONIC ANEMIA EGD/capsule-WNL-2018 [Dr.vanga]-UGIB ; colonoscopy-2018 [Elliot] ; capsule-not done; hemoglobin around 8.8; iron  saturation 5% [Dr.Tate].  Poor tolerance of p.o. 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   GIST (gastrointestinal stromal tumor) of small bowel, malignant (HCC)  03/12/2020 Initial  Diagnosis   GIST (gastrointestinal stromal tumor) of small bowel, malignant (HCC)     HISTORY OF PRESENTING ILLNESS: Alone; ambulating independently. 6 L of oxygen nasal cannula.    Brinleigh  Idora Brosious 78 y.o.  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  Patient did any blood in stools or black-colored stools.  She takes oral iron  every other day.  She is compliant with her Gleevec .  Patient denies any worsening swelling of the legs.  She continues to wear compression stockings.  Intermittently on diuretics. Denies any nausea vomiting from the Gleevec .  Review of Systems  Constitutional:  Positive for malaise/fatigue. Negative for chills, diaphoresis, fever and weight loss.  HENT:  Negative for nosebleeds and sore throat.   Eyes:  Negative for double vision.  Respiratory:  Positive for shortness of breath. Negative for cough, hemoptysis, sputum production and wheezing.   Cardiovascular:  Negative for chest pain, palpitations, orthopnea and leg swelling.  Gastrointestinal:  Negative for abdominal pain, blood in stool, constipation, diarrhea, heartburn, melena, nausea and vomiting.  Genitourinary:  Negative for dysuria, frequency and urgency.  Musculoskeletal:  Negative for back pain and joint pain.  Skin: Negative.  Negative for itching and rash.  Neurological:  Negative for dizziness, tingling, focal weakness, weakness and headaches.  Endo/Heme/Allergies:  Does not bruise/bleed easily.  Psychiatric/Behavioral:  Negative for depression. The patient is not nervous/anxious and does not have insomnia.     MEDICAL HISTORY:  Past Medical History:  Diagnosis Date   Anemia    Asthma 1950   CHF (congestive heart failure) (HCC)    COPD (chronic obstructive pulmonary disease) (HCC)    Cor pulmonale (HCC)    DVT (deep venous thrombosis) (HCC) 2018   GERD (gastroesophageal reflux disease)    GIST (gastrointestinal stromal tumor) of small bowel, malignant (HCC)    Glaucoma 1949   Hyperlipidemia    Hypertension    Obesity, unspecified    OSA treated with BiPAP    Personal history of  tobacco use, presenting hazards to health    Pneumonia    Presence of IVC filter    Pulmonary HTN (HCC)    Supplemental oxygen dependent    T2DM (type 2 diabetes mellitus) (HCC)    Varicose veins of lower extremities with other complications     SURGICAL HISTORY: Past Surgical History:  Procedure Laterality Date   BREAST BIOPSY Right 1991   BREAST BIOPSY Left 2013   COLONOSCOPY  2010   Dr. Viktoria   COLONOSCOPY  Jan 2016   Dr Viktoria   ESOPHAGOGASTRODUODENOSCOPY N/A 11/26/2016   Procedure: ESOPHAGOGASTRODUODENOSCOPY (EGD);  Surgeon: Unk Corinn Skiff, MD;  Location: [facility] ENDOSCOPY;  Service: Gastroenterology;  Laterality: N/A;   GIVENS CAPSULE STUDY N/A 11/26/2016   Procedure: GIVENS CAPSULE STUDY, if EGD negative, plan to drop capsule with scope if EGD negative;  Surgeon: Unk Corinn Skiff, MD;  Location: [facility] ENDOSCOPY;  Service: Gastroenterology;  Laterality: N/A;   IVC FILTER INSERTION N/A 11/14/2016   Procedure: IVC Filter Insertion;  Surgeon: Jama Cordella MATSU, MD;  Location: [facility] INVASIVE CV LAB;  Service: Cardiovascular;  Laterality: N/A;   PARTIAL COLECTOMY N/A 11/05/2020   Procedure: SMALL BOWEL RESECTION, REMOVAL OF GIST TUMOR AND LYMPH NODES, UMBILICAL HERNIA REPAIR;  Surgeon: Dessa Reyes ORN, MD;  Location: [facility] ORS;  Service:  General;  Laterality: N/A;  small bowel resection, RNFA to assist; TAPP block per anesthesia   RIGHT/LEFT HEART CATH AND CORONARY ANGIOGRAPHY N/A 06/30/2019   Procedure: RIGHT/LEFT HEART CATH AND CORONARY ANGIOGRAPHY;  Surgeon: Rolan Ezra RAMAN, MD;  Location[facility] INVASIVE CV LAB;  Service: Cardiovascular;  Laterality: N/A;   VEIN SURGERY Right 2006   Vein Closure Procedure; RF ablation of right GSV    SOCIAL HISTORY: Social History   Socioeconomic History   Marital status: Single    Spouse name: Not on file   Number of children: 2   Years of education: Not on file   Highest education level: Not on file  Occupational History   Occupation:  CELL    Employer: RETIRED  Tobacco Use   Smoking status: Former    Current packs/day: 0.00    Average packs/day: 1.5 packs/day for 20.0 years (30.0 ttl pk-yrs)    Types: Cigarettes    Start date: 2    Quit date: 1999    Years since quitting: 26.7   Smokeless tobacco: Never  Vaping Use   Vaping status: Never Used  Substance and Sexual Activity   Alcohol use: No    Alcohol/week: 0.0 standard drinks of alcohol   Drug use: No   Sexual activity: Not Currently  Other Topics Concern   Not on file  Social History Narrative   Independent at baseline. Lives at home with her brother; in [place]. Quit smoking 20 years; no alcohol.    Social Drivers of Corporate investment banker Strain: Not on file  Food Insecurity: Not on file  Transportation Needs: Not on file  Physical Activity: Not on file  Stress: Not on file  Social Connections: Not on file  Intimate Partner Violence: Not on file    FAMILY HISTORY: Family History  Problem Relation Age of Onset   Prostate cancer Father    Other Sister        mouth cancer   Hypertension Mother     ALLERGIES:  has no known allergies.  MEDICATIONS:  Current Outpatient Medications  Medication Sig Dispense Refill   acetaminophen  (TYLENOL ) 325 MG tablet Take 2 tablets (650 mg total) by mouth every 6 (six) hours as needed for mild pain (or Fever >/= 101).     albuterol  (VENTOLIN  HFA) 108 (90 Base) MCG/ACT inhaler Inhale 2 puffs into the lungs every 6 (six) hours as needed for wheezing or shortness of breath. 8 g 0   aspirin  81 MG chewable tablet Chew 1 tablet (81 mg total) by mouth daily. 30 tablet 0   clobetasol  cream (TEMOVATE ) 0.05 % Apply 1 Application topically 3 (three) times a week.     Continuous Glucose Sensor (FREESTYLE LIBRE 3 PLUS SENSOR) MISC Change sensor every 15 days. 2 each 6   dorzolamide-timolol  (COSOPT) 2-0.5 % ophthalmic solution 1 drop 2 (two) times daily.     EMBECTA PEN NEEDLE NANO 2 GEN 32G X 4 MM MISC USE WITH  LANTUS  AND HUMALOG     FARXIGA  10 MG TABS tablet Take 1 tablet by mouth once daily 90 tablet 3   ferrous gluconate  (FERGON) 325 MG tablet Take 325 mg by mouth every other day.     furosemide  (LASIX ) 20 MG tablet Take 20 mg by mouth 3 (three) times daily.     HUMALOG KWIKPEN 100 UNIT/ML KwikPen Inject into the skin.     insulin  glargine (LANTUS ) 100 UNIT/ML injection Inject 32 Units into the skin in the morning.  latanoprost  (XALATAN ) 0.005 % ophthalmic solution Place 1 drop into both eyes at bedtime.      loperamide (IMODIUM) 2 MG capsule Take by mouth as needed for diarrhea or loose stools.     lovastatin (MEVACOR) 20 MG tablet Take 20 mg by mouth every evening.      metFORMIN  (GLUCOPHAGE ) 1000 MG tablet Take 1,000 mg by mouth daily.      Multiple Vitamin (MULTIVITAMIN) tablet Take 1 tablet by mouth daily.     omeprazole  (PRILOSEC) 40 MG capsule Take 40 mg by mouth daily.     OPSUMIT  10 MG tablet Take 1 tablet (10 mg total) by mouth daily. 90 tablet 3   OXYGEN Inhale into the lungs. [employer]     potassium chloride  SA (KLOR-CON  M) 20 MEQ tablet Take 2 tablets (40 mEq total) by mouth daily. 180 tablet 3   tadalafil , PAH, (ADCIRCA ) 20 MG tablet TAKE 2 TABLETS DAILY 60 tablet 3   timolol  (TIMOPTIC ) 0.5 % ophthalmic solution Place 1 drop into both eyes 2 (two) times daily.      Tiotropium Bromide-Olodaterol (STIOLTO RESPIMAT ) 2.5-2.5 MCG/ACT AERS INHALE 2 PUFFS BY MOUTH ONCE DAILY 4 g 11   imatinib  (GLEEVEC ) 100 MG tablet TAKE 4 TABLETS (400 MG TOTAL) BY MOUTH DAILY. TAKE WITH MEALS AND LARGE GLASS OF WATER 120 tablet 2   No current facility-administered medications for this visit.      PHYSICAL EXAMINATION:   Vitals:   12/29/23 1317  BP: (!) 94/58  Pulse: 67  Resp: 16  Temp: 98.6 F (37 C)  SpO2: (!) 86%     Filed Weights   12/29/23 1317  Weight: 209 lb 6.4 oz (95 kg)      Physical Exam HENT:     Head: Normocephalic and atraumatic.     Mouth/Throat:     Pharynx:  No oropharyngeal exudate.  Eyes:     Pupils: Pupils are equal, round, and reactive to light.  Cardiovascular:     Rate and Rhythm: Normal rate and regular rhythm.  Pulmonary:     Effort: No respiratory distress.     Breath sounds: No wheezing.     Comments: Decreased breath sounds. Abdominal:     General: Bowel sounds are normal. There is no distension.     Palpations: Abdomen is soft. There is no mass.     Tenderness: There is no abdominal tenderness. There is no guarding or rebound.  Musculoskeletal:        General: No tenderness. Normal range of motion.     Cervical back: Normal range of motion and neck supple.  Skin:    General: Skin is warm.  Neurological:     Mental Status: She is alert and oriented to person, place, and time.  Psychiatric:        Mood and Affect: Affect normal.     LABORATORY DATA:  I have reviewed the data as listed Lab Results  Component Value Date   WBC 6.1 12/29/2023   HGB 10.2 (L) 12/29/2023   HCT 31.6 (L) 12/29/2023   MCV 94.3 12/29/2023   PLT 252 12/29/2023   Recent Labs    06/15/23 1405 08/14/23 1402 09/15/23 1356 11/18/23 1608 12/29/23 1313  NA 138   < > 135 137 135  K 4.0   < > 4.3 4.0 4.2  CL 103   < > 103 103 102  CO2 26   < > 23 26 25   GLUCOSE 88   < >  205* 108* 92  BUN 27*   < > 27* 23 18  CREATININE 1.21*   < > 1.45* 1.40* 1.25*  CALCIUM  9.1   < > 8.5* 8.7* 8.8*  GFRNONAA 46*   < > 37* 39* 44*  PROT 7.2  --  7.1  --  7.1  ALBUMIN  3.9  --  3.9  --  3.9  AST 20  --  26  --  22  ALT 18  --  17  --  14  ALKPHOS 44  --  44  --  45  BILITOT 0.5  --  0.3  --  0.5   < > = values in this interval not displayed.     CT ABDOMEN PELVIS WO CONTRAST Result Date: 12/24/2023 CLINICAL DATA:  History of gist tumor post resection EXAM: CT ABDOMEN AND PELVIS WITHOUT CONTRAST TECHNIQUE: Multidetector CT imaging of the abdomen and pelvis was performed following the standard protocol without IV contrast. RADIATION DOSE REDUCTION: This exam  was performed according to the departmental dose-optimization program which includes automated exposure control, adjustment of the mA and/or kV according to patient size and/or use of iterative reconstruction technique. COMPARISON:  CT 06/04/2023, 11/24/2022, PET CT 10/01/2020, CT 02/15/2020 FINDINGS: Lower chest: Lung bases demonstrate no acute airspace disease. Hepatobiliary: No focal liver abnormality is seen. No gallstones, gallbladder wall thickening, or biliary dilatation. Pancreas: Unremarkable. No pancreatic ductal dilatation or surrounding inflammatory changes. Spleen: Normal in size without focal abnormality. Adrenals/Urinary Tract: Stable adrenal glands. No hydronephrosis. Small nonobstructing left kidney stone. The bladder is unremarkable Stomach/Bowel: Some thickening at the GE junction/gastric cardia, series 2, image 12 similar compared to the prior exams. No dilated small bowel. Diverticular disease of the colon. Chronic wall thickening at sigmoid colon. Mild stranding in the region but not significantly changed. Small metallic densities in the ascending and proximal transverse colon. Vascular/Lymphatic: Nonaneurysmal aorta with atherosclerosis. Multiple subcentimeter retroperitoneal lymph nodes as before. Persistent but generally stable mesenteric nodes, for example central pelvic node measuring 13 mm and series 2, image 47, previously 14 mm. Additional node slightly to the left of midline within the upper pelvis, series 2, image 44 measuring 8 mm, previously 9 mm. No enlarging lymph nodes. IVC filter is stable in position with filter apex at the level of renal vein confluence. Reproductive: Fibroid uterus.  No suspicious adnexal mass. Other: Negative for pelvic effusion or free air. Small fat containing umbilical hernia Musculoskeletal: No acute or suspicious osseous lesion. Degenerative changes at L4-L5 and L5-S1 IMPRESSION: 1. No CT evidence for acute intra-abdominal or pelvic abnormality. No  interval findings to suggest recurrent tumor. 2. Persistent but stable mesenteric and retroperitoneal lymph nodes. No enlarging lymph nodes. 3. Diverticular disease of the colon with chronic wall thickening at the sigmoid colon. Mild stranding/soft tissue thickening in the region but not significantly changed. 4. Small metallic densities in the ascending and proximal transverse colon, question clips, correlate with procedural history 5. Aortic atherosclerosis. Aortic Atherosclerosis (ICD10-I70.0). Electronically Signed   By: Luke Bun M.D.   On: 12/24/2023 17:04      GIST (gastrointestinal stromal tumor) of small bowel, malignant (HCC) # NOV 2021-GIST of small bowel-low grade- high risk [ non-gastric primary GIST >=5 cm.] STAGE II [T3N0]-s/p neoadjuvant therapy with Gleevec  x6 months [November-end of June 2022]-s/p resection ~30% viable tumor noted post resection specimen.  Currently on adjuvant Gleevec  [3-5 years].  No evidence of recurrence.    # SEP 2025- - CT AP-  No CT evidence  for acute intra-abdominal or pelvic abnormality. No interval findings to suggest recurrent tumor.Persistent but stable mesenteric and retroperitoneal lymph nodes. No enlarging lymph nodes Since patient is high risk GIST-I would recommend 5 years [until end of  2026] especially as patient is tolerating Gleevec  without any major side effects.  However if she does develop any side effects we will discontinue Gleevec - stable.   # Tolerating Gleevec  well except for mild side effects [diarrhea]; swelling in the feet [see below]- stable.   # Swelling in the feet-grade 1 Continue compression stockings/diuretics as needed.  stable.   #CHF [Dr.McClean, cards-[place]]/COPD home O2 4-6Lits;-borderline respiratory status-  stable.   # CKD stage III-[diuretics]   stable.   # reflex- on PPI- stable.   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   Recommend PO iron  one pill every day-  HOLD venofer   today; if worse- then consider venofer .   # Myalgias/arthralgias question Gleevec  versus others-  ca+vit D; tylenol  as needed- stable.   Non-contrast # DISPOSITION:.   # HOLD venofer  today # Follow up in 3 month-  MD; labs-cbc/cmp;iron  studies/ferritin; vitD 25-OH- possible venofer ;   Dr.B  # I reviewed the blood work- with the patient in detail; also reviewed the imaging independently [as summarized above]; and with the patient in detail.     All questions were answered. The patient knows to call the clinic with any problems, questions or concerns.    Cindy JONELLE Joe, MD 12/29/2023 2:27 PM

## 2023-12-29 NOTE — Assessment & Plan Note (Addendum)
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  Currently on adjuvant Gleevec  [3-5 years].  No evidence of recurrence.    # SEP 2025- - CT AP-  No CT evidence for acute intra-abdominal or pelvic abnormality. No interval findings to suggest recurrent tumor.Persistent but stable mesenteric and retroperitoneal lymph nodes. No enlarging lymph nodes Since patient is high risk GIST-I would recommend 5 years [until end of  2026] especially as patient is tolerating Gleevec  without any major side effects.  However if she does develop any side effects we will discontinue Gleevec - stable.   # Tolerating Gleevec  well except for mild side effects [diarrhea]; swelling in the feet [see below]- stable.   # Swelling in the feet-grade 1 Continue compression stockings/diuretics as needed.  stable.   #CHF [Dr.McClean, cards-[place]]/COPD home O2 4-6Lits;-borderline respiratory status-  stable.   # CKD stage III-[diuretics]   stable.   # reflex- on PPI- stable.   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   Recommend PO iron  one pill every day-  HOLD venofer  today; if worse- then consider venofer .   # Myalgias/arthralgias question Gleevec  versus others-  ca+vit D; tylenol  as needed- stable.   Non-contrast # DISPOSITION:.   # HOLD venofer  today # Follow up in 3 month-  MD; labs-cbc/cmp;iron  studies/ferritin; vitD 25-OH- possible venofer ;   Dr.B  # I reviewed the blood work- with the patient in detail; also reviewed the imaging independently [as summarized above]; and with the patient in detail.

## 2024-01-01 ENCOUNTER — Other Ambulatory Visit: Payer: Self-pay

## 2024-01-01 NOTE — Progress Notes (Signed)
 Specialty Pharmacy Refill Coordination Note  Susan  Reola Barajas is a 78 y.o. female contacted today regarding refills of specialty medication(s) Imatinib  Mesylate (GLEEVEC )   Patient requested Delivery   Delivery date: 01/08/24   Verified address: [address]  [place]    Medication will be filled on 09.25.25.

## 2024-01-07 ENCOUNTER — Other Ambulatory Visit: Payer: Self-pay

## 2024-01-25 ENCOUNTER — Ambulatory Visit: Admitting: Internal Medicine

## 2024-01-25 ENCOUNTER — Other Ambulatory Visit: Payer: Self-pay

## 2024-01-25 VITALS — BP 126/78 | HR 89 | Temp 98.1°F | Resp 16 | Ht 64.0 in | Wt 209.3 lb

## 2024-01-25 DIAGNOSIS — Z794 Long term (current) use of insulin: Secondary | ICD-10-CM | POA: Diagnosis not present

## 2024-01-25 DIAGNOSIS — Z23 Encounter for immunization: Secondary | ICD-10-CM | POA: Diagnosis not present

## 2024-01-25 DIAGNOSIS — I50810 Right heart failure, unspecified: Secondary | ICD-10-CM | POA: Diagnosis not present

## 2024-01-25 DIAGNOSIS — Z1231 Encounter for screening mammogram for malignant neoplasm of breast: Secondary | ICD-10-CM

## 2024-01-25 DIAGNOSIS — E1169 Type 2 diabetes mellitus with other specified complication: Secondary | ICD-10-CM

## 2024-01-25 LAB — POCT GLYCOSYLATED HEMOGLOBIN (HGB A1C): Hemoglobin A1C: 6.3 % — AB (ref 4.0–5.6)

## 2024-01-25 MED ORDER — FREESTYLE LIBRE 3 PLUS SENSOR MISC: 6 refills | Status: AC

## 2024-01-25 MED ORDER — FARXIGA 10 MG PO TABS: 10.0000 mg | ORAL_TABLET | Freq: Every day | ORAL | 1 refills | Status: AC

## 2024-01-25 NOTE — Progress Notes (Signed)
 Established Patient Office Visit  Subjective    Patient ID: Susan Barajas, female    DOB: 08-07-1945  Age: 78 y.o. MRN: [id number]  CC:  Chief Complaint  Patient presents with   Medical Management of Chronic Issues    HPI Susan  Herlinda Barajas presents to follow up on chronic medical conditions.   Discussed the use of AI scribe software for clinical note transcription with the patient, who gave verbal consent to proceed.  History of Present Illness Susan  Mckayla Barajas is a 78 year old female with diabetes who presents for a routine diabetic follow-up visit.  Her A1c is 6.3. She experiences occasional hypoglycemia, particularly when skipping meals, and manages these episodes by monitoring her insulin  intake. Her blood sugar was 160 mg/dL yesterday, with a stable reading of 142 mg/dL at bedtime and upon waking.  She uses Humalog insulin  at breakfast and dinner, adjusting the dose based on her blood sugar levels. Typically, she takes 8 units in the morning but reduces the dose to 5 units if her blood sugar is lower. She also uses Lantus  insulin  and has recently received a new supply. She is on Farxiga , which she receives through a Recruitment consultant.  She uses the [facility] for continuous glucose monitoring but has experienced some issues with the device, including accidental damage. Despite this, she finds it helpful for monitoring her blood sugar levels.  She had an eye exam last month and attends regular check-ups every four months. No numbness or tingling in her feet.   Hypertension/CHF/Pulmonary HTN/OSA: -Medications: Lasix  40 mg, KCl 20 meQ, Opsulmit 10 mg, Tadafil 40 mg, Farxiga  10 mg -Patient is compliant with above medications and reports no side effects. -Denies any SOB, CP, vision changes, LE edema or symptoms of hypotension -OSA on CPAP -On supplemental oxygen 4L at rest and 6L with exertion  -Last echo 6/25 EF 60-65% -Following with Cardiology and heart  failure specialist   Hx of DVT: -Hx of one blood clot in 2018 in right lower extremity, no longer on anticoagulation and s/p IVC filter  HLD: -Medications: Lovastatin 20 mg, aspirin  -Patient is compliant with above medications and reports no side effects.  -Last lipid panel: Lipid Panel     Component Value Date/Time   CHOL 125 08/12/2022 0908   TRIG 90 08/12/2022 0908   HDL 44 08/12/2022 0908   CHOLHDL 2.8 08/12/2022 0908   VLDL 18 08/12/2022 0908   LDLCALC 63 08/12/2022 0908    Diabetes, Type 2: -Last A1c 6.1% 7/25 -Diagnosed about 40 years ago, strong family history  -Medications: Metformin  1000 mg daily, Farxiga  10 mg, Lantus  32 units in the morning, Humalog 10 units at breakfast and 10 units at dinner (sometimes holds if blood sugar is low) -Patient is compliant with the above medications and reports no side effects.  -Checking BG at home: fasting 130-140. Rarely has low blood sugar. Non-fasting around 160. -Eye exam: follows with Ophthalmology (glaucoma), just had an exam recently -Foot exam: Due today -Microalbumin: Due today -Statin: yes -PNA vaccine: Due today -Denies symptoms of hypoglycemia, polyuria, polydipsia, numbness extremities, foot ulcers/trauma.  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, s/p resection in 2022 -Currently on Imatinib    Chronic Anemia: -Initially how GIST was discovered, had been on IV Venofer  but now  on oral -Currently on ferrous gluconate  325 mg every other day -Last CBC - hgb 11/3, ferritin 13  Glaucoma:  -Following with Ophthalmology -Currently on Dorzolamide-Timolol  drops  GERD: -Currently on Prilosec 40 mg -Symptoms well controlled   Eczema:  -Following with Dermatology   -Currently using Clobetasol  cream as needed   Health Maintenance: -Blood work UTD -Vaccines UTD      Outpatient Encounter Medications as of 01/25/2024  Medication Sig   acetaminophen  (TYLENOL ) 325 MG tablet Take 2 tablets (650 mg total) by mouth every 6 (six) hours as needed for mild pain (or Fever >/= 101).   albuterol  (VENTOLIN  HFA) 108 (90 Base) MCG/ACT inhaler Inhale 2 puffs into the lungs every 6 (six) hours as needed for wheezing or shortness of breath.   aspirin  81 MG chewable tablet Chew 1 tablet (81 mg total) by mouth daily.   clobetasol  cream (TEMOVATE ) 0.05 % Apply 1 Application topically 3 (three) times a week.   dorzolamide-timolol  (COSOPT) 2-0.5 % ophthalmic solution 1 drop 2 (two) times daily.   FARXIGA  10 MG TABS tablet Take 1 tablet by mouth once daily   ferrous gluconate  (FERGON) 325 MG tablet Take 325 mg by mouth every other day.   furosemide  (LASIX ) 20 MG tablet Take 20 mg by mouth 3 (three) times daily.   HUMALOG KWIKPEN 100 UNIT/ML KwikPen Inject into the skin.   imatinib  (GLEEVEC ) 100 MG tablet TAKE 4 TABLETS (400 MG TOTAL) BY MOUTH DAILY. TAKE WITH MEALS AND LARGE GLASS OF WATER   insulin  glargine (LANTUS ) 100 UNIT/ML injection Inject 32 Units into the skin in the morning.   latanoprost  (XALATAN ) 0.005 % ophthalmic solution Place 1 drop into both eyes at bedtime.    loperamide (IMODIUM) 2 MG capsule Take by mouth as needed for diarrhea or loose stools.   lovastatin (MEVACOR) 20 MG tablet Take 20 mg by mouth every evening.    metFORMIN  (GLUCOPHAGE ) 1000 MG tablet Take 1,000 mg by mouth daily.    Multiple Vitamin (MULTIVITAMIN) tablet Take 1 tablet by mouth daily.   omeprazole  (PRILOSEC) 40 MG capsule Take 40 mg by mouth daily.   OPSUMIT  10 MG tablet Take 1 tablet (10 mg total) by mouth daily.   OXYGEN Inhale into the lungs. [employer]   potassium chloride  SA (KLOR-CON  M) 20 MEQ tablet Take 2 tablets (40 mEq total) by mouth daily.   tadalafil , PAH, (ADCIRCA ) 20  MG tablet TAKE 2 TABLETS DAILY   timolol  (TIMOPTIC ) 0.5 % ophthalmic solution Place 1 drop into both eyes 2 (two) times daily.    Tiotropium Bromide-Olodaterol (STIOLTO RESPIMAT ) 2.5-2.5 MCG/ACT AERS INHALE 2 PUFFS BY MOUTH ONCE DAILY   Continuous Glucose Sensor (FREESTYLE LIBRE 3 PLUS SENSOR) MISC Change sensor every 15 days.   EMBECTA PEN NEEDLE NANO 2 GEN 32G X 4 MM MISC USE WITH LANTUS  AND HUMALOG   No facility-administered encounter medications on file as of 01/25/2024.    Past Medical History:  Diagnosis Date   Anemia    Asthma 1950   CHF (congestive heart failure) (HCC)    COPD (chronic obstructive pulmonary disease) (HCC)    Cor pulmonale (HCC)    DVT (deep venous thrombosis) (HCC) 2018   GERD (gastroesophageal reflux disease)    GIST (gastrointestinal stromal tumor) of small bowel, malignant (HCC)    Glaucoma 1949   Hyperlipidemia    Hypertension    Obesity, unspecified    OSA treated with BiPAP    Personal history of tobacco use, presenting hazards  to health    Pneumonia    Presence of IVC filter    Pulmonary HTN (HCC)    Supplemental oxygen dependent    T2DM (type 2 diabetes mellitus) (HCC)    Varicose veins of lower extremities with other complications     Past Surgical History:  Procedure Laterality Date   BREAST BIOPSY Right 1991   BREAST BIOPSY Left 2013   COLONOSCOPY  2010   Dr. Viktoria   COLONOSCOPY  Jan 2016   Dr Viktoria   ESOPHAGOGASTRODUODENOSCOPY N/A 11/26/2016   Procedure: ESOPHAGOGASTRODUODENOSCOPY (EGD);  Surgeon: Unk Corinn Skiff, MD;  Location: [facility] ENDOSCOPY;  Service: Gastroenterology;  Laterality: N/A;   GIVENS CAPSULE STUDY N/A 11/26/2016   Procedure: GIVENS CAPSULE STUDY, if EGD negative, plan to drop capsule with scope if EGD negative;  Surgeon: Unk Corinn Skiff, MD;  Location: [facility] ENDOSCOPY;  Service: Gastroenterology;  Laterality: N/A;   IVC FILTER INSERTION N/A 11/14/2016   Procedure: IVC Filter Insertion;  Surgeon: Jama Cordella MATSU, MD;  Location: [facility] INVASIVE CV LAB;  Service: Cardiovascular;  Laterality: N/A;   PARTIAL COLECTOMY N/A 11/05/2020   Procedure: SMALL BOWEL RESECTION, REMOVAL OF GIST TUMOR AND LYMPH NODES, UMBILICAL HERNIA REPAIR;  Surgeon: Dessa Reyes ORN, MD;  Location: [facility] ORS;  Service: General;  Laterality: N/A;  small bowel resection, RNFA to assist; TAPP block per anesthesia   RIGHT/LEFT HEART CATH AND CORONARY ANGIOGRAPHY N/A 06/30/2019   Procedure: RIGHT/LEFT HEART CATH AND CORONARY ANGIOGRAPHY;  Surgeon: Rolan Ezra RAMAN, MD;  Location: [facility] INVASIVE CV LAB;  Service: Cardiovascular;  Laterality: N/A;   VEIN SURGERY Right 2006   Vein Closure Procedure; RF ablation of right GSV    Family History  Problem Relation Age of Onset   Prostate cancer Father    Other Sister        mouth cancer   Hypertension Mother     Social History   Socioeconomic History   Marital status: Single    Spouse name: Not on file   Number of children: 2   Years of education: Not on file   Highest education level: Not on file  Occupational History   Occupation: CELL    Employer: RETIRED  Tobacco Use   Smoking status: Former    Current packs/day: 0.00    Average packs/day: 1.5 packs/day for 20.0 years (30.0 ttl pk-yrs)    Types: Cigarettes    Start date: 33    Quit date: 1999    Years since quitting: 26.8   Smokeless tobacco: Never  Vaping Use   Vaping status: Never Used  Substance and Sexual Activity   Alcohol use: No    Alcohol/week: 0.0 standard drinks of alcohol   Drug use: No   Sexual activity: Not Currently  Other Topics Concern   Not on file  Social History Narrative   Independent at baseline. Lives at home with her brother; in [place]. Quit smoking 20 years; no alcohol.    Social Drivers of Corporate investment banker Strain: Not on file  Food Insecurity: Not on file  Transportation Needs: Not on file  Physical Activity: Not on file  Stress: Not on file  Social Connections: Not on  file  Intimate Partner Violence: Not on file    Review of Systems  All other systems reviewed and are negative.       Objective    BP 126/78 (Cuff Size: Large)   Pulse 89   Temp 98.1 F (36.7 C) (Oral)  Resp 16   Ht 5' 4 (1.626 m)   Wt 209 lb 4.8 oz (94.9 kg)   BMI 35.93 kg/m   Physical Exam Constitutional:      Appearance: Normal appearance.  HENT:     Head: Normocephalic and atraumatic.  Eyes:     Conjunctiva/sclera: Conjunctivae normal.  Cardiovascular:     Rate and Rhythm: Normal rate and regular rhythm.     Pulses:          Dorsalis pedis pulses are 2+ on the right side and 2+ on the left side.  Pulmonary:     Effort: Pulmonary effort is normal.     Breath sounds: Normal breath sounds.     Comments: On O2 [place] Musculoskeletal:     Right foot: Normal range of motion. No deformity, bunion, Charcot foot, foot drop or prominent metatarsal heads.     Left foot: Normal range of motion. No deformity, bunion, Charcot foot, foot drop or prominent metatarsal heads.  Feet:     Right foot:     Protective Sensation: 6 sites tested.  6 sites sensed.     Skin integrity: Skin integrity normal.     Toenail Condition: Right toenails are normal.     Left foot:     Protective Sensation: 6 sites tested.  6 sites sensed.     Skin integrity: Skin integrity normal.     Toenail Condition: Left toenails are normal.  Skin:    General: Skin is warm and dry.  Neurological:     General: No focal deficit present.     Mental Status: She is alert. Mental status is at baseline.  Psychiatric:        Mood and Affect: Mood normal.        Behavior: Behavior normal.     Last CBC Lab Results  Component Value Date   WBC 6.1 12/29/2023   HGB 10.2 (L) 12/29/2023   HCT 31.6 (L) 12/29/2023   MCV 94.3 12/29/2023   MCH 30.4 12/29/2023   RDW 14.6 12/29/2023   PLT 252 12/29/2023   Last metabolic panel Lab Results  Component Value Date   GLUCOSE 92 12/29/2023   NA 135 12/29/2023   K  4.2 12/29/2023   CL 102 12/29/2023   CO2 25 12/29/2023   BUN 18 12/29/2023   CREATININE 1.25 (H) 12/29/2023   GFRNONAA 44 (L) 12/29/2023   CALCIUM  8.8 (L) 12/29/2023   PROT 7.1 12/29/2023   ALBUMIN  3.9 12/29/2023   LABGLOB 3.2 08/15/2019   BILITOT 0.5 12/29/2023   ALKPHOS 45 12/29/2023   AST 22 12/29/2023   ALT 14 12/29/2023   ANIONGAP 8 12/29/2023   Last lipids Lab Results  Component Value Date   CHOL 125 08/12/2022   HDL 44 08/12/2022   LDLCALC 63 08/12/2022   TRIG 90 08/12/2022   CHOLHDL 2.8 08/12/2022   Last hemoglobin A1c Lab Results  Component Value Date   HGBA1C 6.1 (A) 10/23/2023   Last thyroid functions Lab Results  Component Value Date   TSH 2.080 11/15/2016   Last vitamin D No results found for: 25OHVITD2, 25OHVITD3, VD25OH Last vitamin B12 and Folate Lab Results  Component Value Date   VITAMINB12 1,118 (H) 11/25/2016        Assessment & Plan:   Assessment & Plan Type 2 diabetes mellitus Diabetes well-controlled with A1c of 6.3. Occasional hypoglycemia noted. Insulin  dosing adjusted based on glucose trends and meals. Continuous glucose monitoring with Freestyle Libre, occasional device issues. -  Perform diabetic foot exam. - Order urine test for diabetic kidney disease screening. - Refill Farxiga  prescription. - Refill Freestyle Libre prescription.  Hypertension -Blood pressure stable here today, no changes made to medications and appropriate refills sent to pharmacy.    - POCT HgB A1C - HM Diabetes Foot Exam - Urine Microalbumin w/creat. ratio - Continuous Glucose Sensor (FREESTYLE LIBRE 3 PLUS SENSOR) MISC; Change sensor every 15 days.  Dispense: 2 each; Refill: 6 - Flu vaccine HIGH DOSE PF(Fluzone Trivalent) - FARXIGA  10 MG TABS tablet; Take 1 tablet (10 mg total) by mouth daily.  Dispense: 90 tablet; Refill: 1 - MM 3D SCREENING MAMMOGRAM BILATERAL BREAST; Future - Pneumococcal conjugate vaccine 20-valent (Prevnar 20)   Return  in about 6 months (around 07/25/2024).   Sharyle Fischer, DO

## 2024-01-26 ENCOUNTER — Ambulatory Visit: Payer: Self-pay | Admitting: Internal Medicine

## 2024-01-26 LAB — MICROALBUMIN / CREATININE URINE RATIO
Creatinine, Urine: 26 mg/dL (ref 20–275)
Microalb Creat Ratio: 46 mg/g{creat} — ABNORMAL HIGH (ref <30)
Microalb, Ur: 1.2 mg/dL

## 2024-01-29 ENCOUNTER — Ambulatory Visit

## 2024-01-29 ENCOUNTER — Other Ambulatory Visit: Payer: Self-pay

## 2024-02-01 ENCOUNTER — Other Ambulatory Visit (HOSPITAL_COMMUNITY): Payer: Self-pay

## 2024-02-01 ENCOUNTER — Other Ambulatory Visit: Payer: Self-pay

## 2024-02-01 NOTE — Progress Notes (Signed)
 Specialty Pharmacy Refill Coordination Note  Spoke with Maecyn  Jaydalynn Olivero  Briena  Dawson Hollman is a 78 y.o. female contacted today regarding refills of specialty medication(s) Imatinib  Mesylate (GLEEVEC )  Doses on hand: 5 days (20 tablets) per [facility]  Patient requested: Delivery   Delivery date: 02/04/24   Verified address: [address]  [place] [facility] [phone]  Medication will be filled on 02/03/24.

## 2024-02-01 NOTE — Progress Notes (Deleted)
 Specialty Pharmacy Refill Coordination Note  Spoke with Denaisha  Latamara Melder  Shaila  Annalea Alguire is a 78 y.o. female contacted today regarding refills of specialty medication(s) No data recorded Doses on hand: No data recorded Injection date: No data recorded  Patient requested: No data recorded  Delivery date: No data recorded  Verified address: No data recorded Medication will be filled on ***.

## 2024-02-02 ENCOUNTER — Other Ambulatory Visit: Payer: Self-pay

## 2024-02-04 ENCOUNTER — Telehealth: Payer: Self-pay

## 2024-02-04 NOTE — Telephone Encounter (Signed)
 Copied from CRM [id number]. Topic: General - Other >> Feb 04, 2024 12:27 PM Joesph NOVAK wrote: Reason for CRM: patient states she received a phone call from the office. No documentation.  Please call back if office was attempting to reach patient.

## 2024-02-04 NOTE — Telephone Encounter (Signed)
 Advised pt no documentation who called her. Pt verbalized understanding

## 2024-02-12 ENCOUNTER — Other Ambulatory Visit: Payer: Self-pay

## 2024-02-12 NOTE — Progress Notes (Signed)
 Specialty Pharmacy Ongoing Clinical Assessment Note  Susan Barajas is a 78 y.o. female who is being followed by the specialty pharmacy service for RxSp Oncology   Patient's specialty medication(s) reviewed today: Imatinib  Mesylate (GLEEVEC )   Missed doses in the last 4 weeks: 0   Patient/Caregiver did not have any additional questions or concerns.   Therapeutic benefit summary: Patient is achieving benefit   Adverse events/side effects summary: No adverse events/side effects   Patient's therapy is appropriate to: Continue    Goals Addressed             This Visit's Progress    Slow Disease Progression   On track    Patient is on track. Patient will maintain adherence.  CT from Sept 2025 showed no findings to suggest recurrent tumor.          Follow up: 6 months  [facility]

## 2024-02-25 ENCOUNTER — Other Ambulatory Visit: Payer: Self-pay

## 2024-02-26 ENCOUNTER — Other Ambulatory Visit: Payer: Self-pay

## 2024-02-29 ENCOUNTER — Other Ambulatory Visit: Payer: Self-pay

## 2024-02-29 ENCOUNTER — Other Ambulatory Visit (HOSPITAL_COMMUNITY): Payer: Self-pay

## 2024-02-29 NOTE — Progress Notes (Signed)
 Specialty Pharmacy Refill Coordination Note  Spoke with Cataleah  Mylasia Vorhees  Uriyah  Michie Molnar is a 78 y.o. female contacted today regarding refills of specialty medication(s) Imatinib  Mesylate (GLEEVEC )  Doses on hand: 7 days (28 tabs)  Patient requested: Delivery   Delivery date: 03/02/24   Verified address: [address]  [place] [phone]  Medication will be filled on 03/01/24 .

## 2024-03-15 ENCOUNTER — Other Ambulatory Visit (HOSPITAL_COMMUNITY): Payer: Self-pay | Admitting: Cardiology

## 2024-03-15 DIAGNOSIS — I272 Pulmonary hypertension, unspecified: Secondary | ICD-10-CM

## 2024-03-24 ENCOUNTER — Other Ambulatory Visit: Payer: Self-pay | Admitting: Internal Medicine

## 2024-03-24 ENCOUNTER — Other Ambulatory Visit (HOSPITAL_COMMUNITY): Payer: Self-pay

## 2024-03-24 ENCOUNTER — Other Ambulatory Visit: Payer: Self-pay

## 2024-03-24 DIAGNOSIS — C49A3 Gastrointestinal stromal tumor of small intestine: Secondary | ICD-10-CM

## 2024-03-24 MED ORDER — IMATINIB MESYLATE 100 MG PO TABS
ORAL_TABLET | ORAL | 2 refills | Status: AC
  Filled 2024-03-24: qty 120, fill #0
  Filled 2024-03-25: qty 120, 30d supply, fill #0
  Filled 2024-04-20: qty 120, 30d supply, fill #1
  Filled 2024-05-18: qty 120, 30d supply, fill #2

## 2024-03-25 ENCOUNTER — Other Ambulatory Visit (HOSPITAL_COMMUNITY): Payer: Self-pay

## 2024-03-25 ENCOUNTER — Other Ambulatory Visit: Payer: Self-pay

## 2024-03-25 NOTE — Progress Notes (Signed)
 Specialty Pharmacy Refill Coordination Note  Susan  [name] is a 78 y.o. female contacted today regarding refills of specialty medication(s) Imatinib  Mesylate (GLEEVEC )   Patient requested Delivery   Delivery date: 03/29/24   Verified address: [address]  [place] [facility] [phone]   Medication will be filled on: 03/28/24

## 2024-03-28 ENCOUNTER — Other Ambulatory Visit: Payer: Self-pay

## 2024-04-04 ENCOUNTER — Ambulatory Visit: Admitting: Internal Medicine

## 2024-04-04 ENCOUNTER — Inpatient Hospital Stay

## 2024-04-04 ENCOUNTER — Encounter: Payer: Self-pay | Admitting: Internal Medicine

## 2024-04-04 ENCOUNTER — Inpatient Hospital Stay: Attending: Internal Medicine

## 2024-04-04 VITALS — BP 117/46 | HR 85 | Temp 97.1°F | Resp 16 | Ht 64.0 in | Wt 211.5 lb

## 2024-04-04 DIAGNOSIS — Z794 Long term (current) use of insulin: Secondary | ICD-10-CM | POA: Insufficient documentation

## 2024-04-04 DIAGNOSIS — I272 Pulmonary hypertension, unspecified: Secondary | ICD-10-CM | POA: Diagnosis not present

## 2024-04-04 DIAGNOSIS — C49A3 Gastrointestinal stromal tumor of small intestine: Secondary | ICD-10-CM

## 2024-04-04 DIAGNOSIS — K219 Gastro-esophageal reflux disease without esophagitis: Secondary | ICD-10-CM | POA: Insufficient documentation

## 2024-04-04 DIAGNOSIS — Z79899 Other long term (current) drug therapy: Secondary | ICD-10-CM | POA: Insufficient documentation

## 2024-04-04 DIAGNOSIS — C49A9 Gastrointestinal stromal tumor of other sites: Secondary | ICD-10-CM | POA: Diagnosis present

## 2024-04-04 DIAGNOSIS — E669 Obesity, unspecified: Secondary | ICD-10-CM | POA: Diagnosis not present

## 2024-04-04 DIAGNOSIS — Z808 Family history of malignant neoplasm of other organs or systems: Secondary | ICD-10-CM | POA: Diagnosis not present

## 2024-04-04 DIAGNOSIS — Z9981 Dependence on supplemental oxygen: Secondary | ICD-10-CM | POA: Diagnosis not present

## 2024-04-04 DIAGNOSIS — I509 Heart failure, unspecified: Secondary | ICD-10-CM | POA: Diagnosis not present

## 2024-04-04 DIAGNOSIS — Z8042 Family history of malignant neoplasm of prostate: Secondary | ICD-10-CM | POA: Insufficient documentation

## 2024-04-04 DIAGNOSIS — N183 Chronic kidney disease, stage 3 unspecified: Secondary | ICD-10-CM | POA: Insufficient documentation

## 2024-04-04 DIAGNOSIS — E785 Hyperlipidemia, unspecified: Secondary | ICD-10-CM | POA: Diagnosis not present

## 2024-04-04 DIAGNOSIS — G4730 Sleep apnea, unspecified: Secondary | ICD-10-CM | POA: Insufficient documentation

## 2024-04-04 DIAGNOSIS — Z86718 Personal history of other venous thrombosis and embolism: Secondary | ICD-10-CM | POA: Insufficient documentation

## 2024-04-04 DIAGNOSIS — E119 Type 2 diabetes mellitus without complications: Secondary | ICD-10-CM | POA: Insufficient documentation

## 2024-04-04 DIAGNOSIS — Z7982 Long term (current) use of aspirin: Secondary | ICD-10-CM | POA: Diagnosis not present

## 2024-04-04 DIAGNOSIS — Z7984 Long term (current) use of oral hypoglycemic drugs: Secondary | ICD-10-CM | POA: Insufficient documentation

## 2024-04-04 DIAGNOSIS — Z87891 Personal history of nicotine dependence: Secondary | ICD-10-CM | POA: Insufficient documentation

## 2024-04-04 DIAGNOSIS — D509 Iron deficiency anemia, unspecified: Secondary | ICD-10-CM | POA: Insufficient documentation

## 2024-04-04 DIAGNOSIS — I8393 Asymptomatic varicose veins of bilateral lower extremities: Secondary | ICD-10-CM | POA: Insufficient documentation

## 2024-04-04 DIAGNOSIS — I129 Hypertensive chronic kidney disease with stage 1 through stage 4 chronic kidney disease, or unspecified chronic kidney disease: Secondary | ICD-10-CM | POA: Diagnosis not present

## 2024-04-04 DIAGNOSIS — J4489 Other specified chronic obstructive pulmonary disease: Secondary | ICD-10-CM | POA: Insufficient documentation

## 2024-04-04 LAB — CMP (CANCER CENTER ONLY)
ALT: 11 U/L (ref 0–44)
AST: 21 U/L (ref 15–41)
Albumin: 4.1 g/dL (ref 3.5–5.0)
Alkaline Phosphatase: 56 U/L (ref 38–126)
Anion gap: 10 (ref 5–15)
BUN: 22 mg/dL (ref 8–23)
CO2: 25 mmol/L (ref 22–32)
Calcium: 9.4 mg/dL (ref 8.9–10.3)
Chloride: 103 mmol/L (ref 98–111)
Creatinine: 1.22 mg/dL — ABNORMAL HIGH (ref 0.44–1.00)
GFR, Estimated: 45 mL/min — ABNORMAL LOW
Glucose, Bld: 124 mg/dL — ABNORMAL HIGH (ref 70–99)
Potassium: 4.1 mmol/L (ref 3.5–5.1)
Sodium: 137 mmol/L (ref 135–145)
Total Bilirubin: 0.3 mg/dL (ref 0.0–1.2)
Total Protein: 7.1 g/dL (ref 6.5–8.1)

## 2024-04-04 LAB — CBC WITH DIFFERENTIAL (CANCER CENTER ONLY)
Abs Immature Granulocytes: 0.02 K/uL (ref 0.00–0.07)
Basophils Absolute: 0 K/uL (ref 0.0–0.1)
Basophils Relative: 0 %
Eosinophils Absolute: 0 K/uL (ref 0.0–0.5)
Eosinophils Relative: 0 %
HCT: 33.1 % — ABNORMAL LOW (ref 36.0–46.0)
Hemoglobin: 10.9 g/dL — ABNORMAL LOW (ref 12.0–15.0)
Immature Granulocytes: 0 %
Lymphocytes Relative: 16 %
Lymphs Abs: 0.9 K/uL (ref 0.7–4.0)
MCH: 31.1 pg (ref 26.0–34.0)
MCHC: 32.9 g/dL (ref 30.0–36.0)
MCV: 94.3 fL (ref 80.0–100.0)
Monocytes Absolute: 0.4 K/uL (ref 0.1–1.0)
Monocytes Relative: 7 %
Neutro Abs: 4.2 K/uL (ref 1.7–7.7)
Neutrophils Relative %: 77 %
Platelet Count: 221 K/uL (ref 150–400)
RBC: 3.51 MIL/uL — ABNORMAL LOW (ref 3.87–5.11)
RDW: 13.7 % (ref 11.5–15.5)
WBC Count: 5.5 K/uL (ref 4.0–10.5)
nRBC: 0 % (ref 0.0–0.2)

## 2024-04-04 LAB — IRON AND TIBC
Iron: 47 ug/dL (ref 28–170)
Saturation Ratios: 14 % (ref 10.4–31.8)
TIBC: 328 ug/dL (ref 250–450)
UIBC: 280 ug/dL

## 2024-04-04 LAB — VITAMIN D 25 HYDROXY (VIT D DEFICIENCY, FRACTURES): Vit D, 25-Hydroxy: 54.2 ng/mL (ref 30–100)

## 2024-04-04 LAB — FERRITIN: Ferritin: 25 ng/mL (ref 11–307)

## 2024-04-04 NOTE — Assessment & Plan Note (Signed)
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  Currently on adjuvant Gleevec  [3-5 years].  No evidence of recurrence.    # SEP 2025- - CT AP-  No CT evidence for acute intra-abdominal or pelvic abnormality. No interval findings to suggest recurrent tumor.Persistent but stable mesenteric and retroperitoneal lymph nodes. No enlarging lymph nodes Since patient is high risk GIST-I would recommend 5 years [until end of  2026] especially as patient is tolerating Gleevec  without any major side effects.  However if she does develop any side effects we will discontinue Gleevec - stable.   # Tolerating Gleevec  well except for mild side effects [diarrhea]; swelling in the feet [see below]- stable.   # Swelling in the feet-grade 1 Continue compression stockings/diuretics as needed.  stable.   #CHF [Dr.McClean, cards-[place]]/COPD home O2 4-6Lits;-borderline respiratory status-  stable.   # CKD stage III-[diuretics]    stable.   # reflex- on PPI-  stable.   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   Recommend PO iron  one pill every day-  HOLD venofer  today; if worse- then consider venofer .   # Myalgias/arthralgias question Gleevec  versus others-  ca+vit D; tylenol  as needed- stable.   Non-contrast # DISPOSITION:.   # HOLD venofer  today # Follow up in 3 month-  MD; labs-cbc/cmp;iron  studies/ferritin; vitD 25-OH- possible venofer ;   Dr.B

## 2024-04-04 NOTE — Progress Notes (Signed)
 No questions/concerns today.

## 2024-04-04 NOTE — Progress Notes (Signed)
 [facility] CONSULT NOTE  Patient Care Team: Bernardo Fend, DO as PCP - General (Internal Medicine) Darron Deatrice LABOR, MD as PCP - Cardiology (Cardiology) Dellie Louanne MATSU, MD (General Surgery) Corlis Honor BROCKS, MD (Inactive) (Internal Medicine) Schnier, Cordella MATSU, MD (Vascular Surgery) Dianna Specking, MD as Consulting Physician (Gastroenterology) Rennie Cindy SAUNDERS, MD as Consulting Physician (Oncology)  CHIEF COMPLAINTS/PURPOSE OF CONSULTATION: GIST   HEMATOLOGY HISTORY  Oncology History Overview Note  NOV 2021- CT Bx- TUMOR  Tumor Site: Peritoneum, jejunal  Histologic Type: Gastrointestinal stromal tumor, spindle cell type  Mitotic Rate: 1 per 5 mm squared  Histologic Grade: G1, low grade (mitotic rate less than or equal to 5  per 5 mm squared)- STAGE T2N1 [possible mesneteric LN]; CT A/P-9.8 x9.8 cm  # NOV 29th 2021- GLEEVEC  400 mg/day neoadjuvant therapy 6 months.   SURGICAL PATHOLOGY  CASE: [id number]  PATIENT: Susan  Barajas  Surgical Pathology Report      Specimen Submitted:  A. Lymph node, mesenteric  B. Lymph node, Proximal mesenteric  C. Small bowel with GIST   Clinical History: GIST small bowel       DIAGNOSIS:  A. LYMPH NODE, MESENTERIC; EXCISIONAL BIOPSY:  - REACTIVE LYMPHOID FOLLICULAR HYPERPLASIA WITH PROGRESSIVE  TRANSFORMATION OF GERMINAL CENTERS.  - NEGATIVE FOR MALIGNANCY, ONE LYMPH NODE (0/1).   B. LYMPH NODE, PROXIMAL MESENTERIC; EXCISIONAL BIOPSY:  - REACTIVE LYMPHOID FOLLICULAR HYPERPLASIA.  - NEGATIVE FOR MALIGNANCY, TWO LYMPH NODES (0/2).   C. SMALL BOWEL WITH GIST; RESECTION:  - GASTROINTESTINAL STROMAL TUMOR.  - SEE CANCER SUMMARY BELOW.   CASE SUMMARY: (GASTROINTESTINAL STROMAL TUMOR (GIST): Resection)  Standard(s): AJCC-UICC 8   CLINICAL  Pre-resection Treatment: Previous biopsy, systemic therapy performed  (Gleevec )   SPECIMEN  Procedure: Resection (partial small bowel)   TUMOR  Tumor  Focality:  Unifocal  Tumor Site: Jejunum  Tumor Size: 9.7 x 9.2 x 6.3 cm  Histologic Type: Gastrointestinal stromal tumor, spindle cell type  Mitotic Rate:  Specify mitotic rate per 5 mm squared: Less than 5 per 5  mm squared  Histologic Grade: G1, low grade  Necrosis: Not identified  Treatment Effect: Present, approximately 30% viable tumor remains  Risk Assessment: Moderate (24%) risk of progressive disease   MARGINS:  Margin status:  Margin(s) involved by GIST: Deep, see comment.   Comment:  Tumor is present on ink on the periphery of the tumor nodule.  The  nature of the specimen precludes accurate assessment of where the tumor  may have been attached in situ (apart from the small bowel attachment).  Proximal and distal bowel margins cannot be reliably assessed, given  that they were received detached from the tumor specimen.   REGIONAL LYMPH NODES  Regional lymph node status:  Regional lymph nodes present, all regional lymph nodes negative for  tumor.  Number of lymph nodes examined: Three    # CHRONIC ANEMIA EGD/capsule-WNL-2018 [Dr.vanga]-UGIB ; colonoscopy-2018 [Elliot] ; capsule-not done; hemoglobin around 8.8; iron  saturation 5% [Dr.Tate].  Poor tolerance of p.o. 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   GIST (gastrointestinal stromal tumor) of small bowel, malignant (HCC)  03/12/2020 Initial Diagnosis  GIST (gastrointestinal stromal tumor) of small bowel, malignant (HCC)     HISTORY OF PRESENTING ILLNESS: Alone; ambulating independently. 6 L of oxygen nasal cannula.    Susan  Jaena Barajas 78 y.o.  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  Discussed the use of AI scribe software for clinical note transcription with the patient, who gave verbal consent to proceed.  History of Present Illness   Susan  Shakirah Barajas is a 78 year old female with gastrointestinal stromal tumor of the small intestine, status post resection, currently on adjuvant imatinib , who presents for routine follow-up and management of chronic anemia.  She is receiving adjuvant imatinib  for gastrointestinal stromal tumor and reports no new symptoms or complications since her last visit. She denies diarrhea, other gastrointestinal side effects, dyspnea, or weight changes.  Her recent hemoglobin values have been 10.9 and 10.2 g/dL, compared to a prior value of 11 g/dL. She continues oral iron  supplementation and has not received iron  infusions since before her surgery. She denies new or worsening lower extremity edema. Current renal function results are pending.  She has no additional questions or concerns.      Review of Systems  Constitutional:  Positive for malaise/fatigue. Negative for chills, diaphoresis, fever and weight loss.  HENT:  Negative for nosebleeds and sore throat.   Eyes:  Negative for double vision.  Respiratory:  Positive for shortness of breath. Negative for cough, hemoptysis, sputum production and wheezing.   Cardiovascular:  Negative for chest pain, palpitations, orthopnea and leg swelling.  Gastrointestinal:  Negative for abdominal pain, blood in stool, constipation, diarrhea, heartburn, melena, nausea and vomiting.  Genitourinary:  Negative for dysuria, frequency and urgency.  Musculoskeletal:  Negative for back pain and joint pain.  Skin: Negative.  Negative for itching and rash.  Neurological:  Negative for dizziness, tingling, focal weakness, weakness and headaches.  Endo/Heme/Allergies:  Does not bruise/bleed easily.   Psychiatric/Behavioral:  Negative for depression. The patient is not nervous/anxious and does not have insomnia.     MEDICAL HISTORY:  Past Medical History:  Diagnosis Date   Anemia    Asthma 1950   CHF (congestive heart failure) (HCC)    COPD (chronic obstructive pulmonary disease) (HCC)    Cor pulmonale (HCC)    DVT (deep venous thrombosis) (HCC) 2018   GERD (gastroesophageal reflux disease)    GIST (gastrointestinal stromal tumor) of small bowel, malignant (HCC)    Glaucoma 1949   Hyperlipidemia    Hypertension    Obesity, unspecified    OSA treated with BiPAP    Personal history of tobacco use, presenting hazards to health    Pneumonia    Presence of IVC filter    Pulmonary HTN (HCC)    Supplemental oxygen dependent    T2DM (type 2 diabetes mellitus) (HCC)    Varicose veins of lower extremities with other complications     SURGICAL HISTORY: Past Surgical History:  Procedure Laterality Date   BREAST BIOPSY Right 1991   BREAST BIOPSY Left 2013   COLONOSCOPY  2010   Dr. Viktoria   COLONOSCOPY  Jan 2016   Dr Viktoria   ESOPHAGOGASTRODUODENOSCOPY N/A 11/26/2016   Procedure: ESOPHAGOGASTRODUODENOSCOPY (EGD);  Surgeon: Unk Corinn Skiff, MD;  Location: [facility] ENDOSCOPY;  Service: Gastroenterology;  Laterality: N/A;   GIVENS CAPSULE STUDY N/A 11/26/2016   Procedure: GIVENS CAPSULE STUDY, if EGD negative,  plan to drop capsule with scope if EGD negative;  Surgeon: Unk Corinn Skiff, MD;  Location: [facility] ENDOSCOPY;  Service: Gastroenterology;  Laterality: N/A;   IVC FILTER INSERTION N/A 11/14/2016   Procedure: IVC Filter Insertion;  Surgeon: Jama Cordella MATSU, MD;  Location: [facility] INVASIVE CV LAB;  Service: Cardiovascular;  Laterality: N/A;   PARTIAL COLECTOMY N/A 11/05/2020   Procedure: SMALL BOWEL RESECTION, REMOVAL OF GIST TUMOR AND LYMPH NODES, UMBILICAL HERNIA REPAIR;  Surgeon: Dessa Reyes ORN, MD;  Location: [facility] ORS;  Service: General;  Laterality: N/A;  small bowel  resection, RNFA to assist; TAPP block per anesthesia   RIGHT/LEFT HEART CATH AND CORONARY ANGIOGRAPHY N/A 06/30/2019   Procedure: RIGHT/LEFT HEART CATH AND CORONARY ANGIOGRAPHY;  Surgeon: Rolan Ezra RAMAN, MD;  Location: [facility] INVASIVE CV LAB;  Service: Cardiovascular;  Laterality: N/A;   VEIN SURGERY Right 2006   Vein Closure Procedure; RF ablation of right GSV    SOCIAL HISTORY: Social History   Socioeconomic History   Marital status: Single    Spouse name: Not on file   Number of children: 2   Years of education: Not on file   Highest education level: Not on file  Occupational History   Occupation: CELL    Employer: RETIRED  Tobacco Use   Smoking status: Former    Current packs/day: 0.00    Average packs/day: 1.5 packs/day for 20.0 years (30.0 ttl pk-yrs)    Types: Cigarettes    Start date: 56    Quit date: 1999    Years since quitting: 26.9   Smokeless tobacco: Never  Vaping Use   Vaping status: Never Used  Substance and Sexual Activity   Alcohol use: No    Alcohol/week: 0.0 standard drinks of alcohol   Drug use: No   Sexual activity: Not Currently  Other Topics Concern   Not on file  Social History Narrative   Independent at baseline. Lives at home with her brother; in [place]. Quit smoking 20 years; no alcohol.    Social Drivers of Health   Tobacco Use: Medium Risk (04/04/2024)   Patient History    Smoking Tobacco Use: Former    Smokeless Tobacco Use: Never    Passive Exposure: Not on Actuary Strain: Not on file  Food Insecurity: Not on file  Transportation Needs: Not on file  Physical Activity: Not on file  Stress: Not on file  Social Connections: Not on file  Intimate Partner Violence: Not on file  Depression (PHQ2-9): Low Risk (04/04/2024)   Depression (PHQ2-9)    PHQ-2 Score: 0  Alcohol Screen: Not on file  Housing: Not on file  Utilities: Not on file  Health Literacy: Not on file    FAMILY HISTORY: Family History  Problem  Relation Age of Onset   Prostate cancer Father    Other Sister        mouth cancer   Hypertension Mother     ALLERGIES:  has no known allergies.  MEDICATIONS:  Current Outpatient Medications  Medication Sig Dispense Refill   acetaminophen  (TYLENOL ) 325 MG tablet Take 2 tablets (650 mg total) by mouth every 6 (six) hours as needed for mild pain (or Fever >/= 101).     albuterol  (VENTOLIN  HFA) 108 (90 Base) MCG/ACT inhaler Inhale 2 puffs into the lungs every 6 (six) hours as needed for wheezing or shortness of breath. 8 g 0   aspirin  81 MG chewable tablet Chew 1 tablet (81 mg total) by mouth daily.  30 tablet 0   clobetasol  cream (TEMOVATE ) 0.05 % Apply 1 Application topically 3 (three) times a week.     Continuous Glucose Sensor (FREESTYLE LIBRE 3 PLUS SENSOR) MISC Change sensor every 15 days. 2 each 6   dorzolamide-timolol  (COSOPT) 2-0.5 % ophthalmic solution 1 drop 2 (two) times daily.     EMBECTA PEN NEEDLE NANO 2 GEN 32G X 4 MM MISC USE WITH LANTUS  AND HUMALOG     FARXIGA  10 MG TABS tablet Take 1 tablet (10 mg total) by mouth daily. 90 tablet 1   ferrous gluconate  (FERGON) 325 MG tablet Take 325 mg by mouth daily with breakfast.     furosemide  (LASIX ) 20 MG tablet Take 20 mg by mouth 3 (three) times daily.     HUMALOG KWIKPEN 100 UNIT/ML KwikPen Inject into the skin.     imatinib  (GLEEVEC ) 100 MG tablet TAKE 4 TABLETS (400 MG TOTAL) BY MOUTH DAILY. TAKE WITH MEALS AND LARGE GLASS OF WATER 120 tablet 2   insulin  glargine (LANTUS ) 100 UNIT/ML injection Inject 32 Units into the skin in the morning.     latanoprost  (XALATAN ) 0.005 % ophthalmic solution Place 1 drop into both eyes at bedtime.      loperamide (IMODIUM) 2 MG capsule Take by mouth as needed for diarrhea or loose stools.     lovastatin (MEVACOR) 20 MG tablet Take 20 mg by mouth every evening.      metFORMIN  (GLUCOPHAGE ) 1000 MG tablet Take 1,000 mg by mouth daily.      Multiple Vitamin (MULTIVITAMIN) tablet Take 1 tablet by  mouth daily.     omeprazole  (PRILOSEC) 40 MG capsule Take 40 mg by mouth daily.     OPSUMIT  10 MG tablet Take 1 tablet (10 mg total) by mouth daily. 90 tablet 3   OXYGEN Inhale into the lungs. [employer]     potassium chloride  SA (KLOR-CON  M) 20 MEQ tablet Take 2 tablets (40 mEq total) by mouth daily. 180 tablet 3   tadalafil , PAH, (ADCIRCA ) 20 MG tablet TAKE 2 TABLETS DAILY 60 tablet 3   timolol  (TIMOPTIC ) 0.5 % ophthalmic solution Place 1 drop into both eyes 2 (two) times daily.      Tiotropium Bromide-Olodaterol (STIOLTO RESPIMAT ) 2.5-2.5 MCG/ACT AERS INHALE 2 PUFFS BY MOUTH ONCE DAILY 4 g 11   No current facility-administered medications for this visit.      PHYSICAL EXAMINATION:   Vitals:   04/04/24 0954  BP: (!) 117/46  Pulse: 85  Resp: 16  Temp: (!) 97.1 F (36.2 C)  SpO2: 100%     Filed Weights   04/04/24 0954  Weight: 211 lb 8 oz (95.9 kg)      Physical Exam HENT:     Head: Normocephalic and atraumatic.     Mouth/Throat:     Pharynx: No oropharyngeal exudate.  Eyes:     Pupils: Pupils are equal, round, and reactive to light.  Cardiovascular:     Rate and Rhythm: Normal rate and regular rhythm.  Pulmonary:     Effort: No respiratory distress.     Breath sounds: No wheezing.     Comments: Decreased breath sounds. Abdominal:     General: Bowel sounds are normal. There is no distension.     Palpations: Abdomen is soft. There is no mass.     Tenderness: There is no abdominal tenderness. There is no guarding or rebound.  Musculoskeletal:        General: No tenderness. Normal range of motion.  Cervical back: Normal range of motion and neck supple.  Skin:    General: Skin is warm.  Neurological:     Mental Status: She is alert and oriented to person, place, and time.  Psychiatric:        Mood and Affect: Affect normal.     LABORATORY DATA:  I have reviewed the data as listed Lab Results  Component Value Date   WBC 5.5 04/04/2024   HGB 10.9  (L) 04/04/2024   HCT 33.1 (L) 04/04/2024   MCV 94.3 04/04/2024   PLT 221 04/04/2024   Recent Labs    09/15/23 1356 11/18/23 1608 12/29/23 1313 04/04/24 0941  NA 135 137 135 137  K 4.3 4.0 4.2 4.1  CL 103 103 102 103  CO2 23 26 25 25   GLUCOSE 205* 108* 92 124*  BUN 27* 23 18 22   CREATININE 1.45* 1.40* 1.25* 1.22*  CALCIUM  8.5* 8.7* 8.8* 9.4  GFRNONAA 37* 39* 44* 45*  PROT 7.1  --  7.1 7.1  ALBUMIN  3.9  --  3.9 4.1  AST 26  --  22 21  ALT 17  --  14 11  ALKPHOS 44  --  45 56  BILITOT 0.3  --  0.5 0.3     No results found.     GIST (gastrointestinal stromal tumor) of small bowel, malignant (HCC) # NOV 2021-GIST of small bowel-low grade- high risk [ non-gastric primary GIST >=5 cm.] STAGE II [T3N0]-s/p neoadjuvant therapy with Gleevec  x6 months [November-end of June 2022]-s/p resection ~30% viable tumor noted post resection specimen.  Currently on adjuvant Gleevec  [3-5 years].  No evidence of recurrence.    # SEP 2025- - CT AP-  No CT evidence for acute intra-abdominal or pelvic abnormality. No interval findings to suggest recurrent tumor.Persistent but stable mesenteric and retroperitoneal lymph nodes. No enlarging lymph nodes Since patient is high risk GIST-I would recommend 5 years [until end of  2026] especially as patient is tolerating Gleevec  without any major side effects.  However if she does develop any side effects we will discontinue Gleevec - stable.   # Tolerating Gleevec  well except for mild side effects [diarrhea]; swelling in the feet [see below]- stable.   # Swelling in the feet-grade 1 Continue compression stockings/diuretics as needed.  stable.   #CHF [Dr.McClean, cards-[place]]/COPD home O2 4-6Lits;-borderline respiratory status-  stable.   # CKD stage III-[diuretics]    stable.   # reflex- on PPI-  stable.   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   Recommend PO iron  one pill every day-  HOLD venofer  today; if worse- then  consider venofer .   # Myalgias/arthralgias question Gleevec  versus others-  ca+vit D; tylenol  as needed- stable.   Non-contrast # DISPOSITION:.   # HOLD venofer  today # Follow up in 3 month-  MD; labs-cbc/cmp;iron  studies/ferritin; vitD 25-OH- possible venofer ;   Dr.B     All questions were answered. The patient knows to call the clinic with any problems, questions or concerns.    Cindy JONELLE Joe, MD 04/04/2024 10:39 AM

## 2024-04-12 ENCOUNTER — Other Ambulatory Visit: Payer: Self-pay | Admitting: Internal Medicine

## 2024-04-12 DIAGNOSIS — I50810 Right heart failure, unspecified: Secondary | ICD-10-CM

## 2024-04-13 NOTE — Telephone Encounter (Signed)
 Too soon for refill, last refill 01/25/24 for 90 and 1 refill.  Requested Prescriptions  Pending Prescriptions Disp Refills   FARXIGA  10 MG TABS tablet [Pharmacy Med Name: Farxiga  10 MG Oral Tablet] 100 tablet 2    Sig: TAKE 1 TABLET BY MOUTH DAILY     Endocrinology:  Diabetes - SGLT2 Inhibitors Failed - 04/13/2024  1:16 PM      Failed - Cr in normal range and within 360 days    Creatinine  Date Value Ref Range Status  04/04/2024 1.22 (H) 0.44 - 1.00 mg/dL Final   Creatinine, Urine  Date Value Ref Range Status  01/25/2024 26 20 - 275 mg/dL Final         Failed - eGFR in normal range and within 360 days    GFR calc Af Amer  Date Value Ref Range Status  10/28/2019 >60 >60 mL/min Final   GFR, Estimated  Date Value Ref Range Status  04/04/2024 45 (L) >60 mL/min Final    Comment:    (NOTE) Calculated using the CKD-EPI Creatinine Equation (2021)    GFR  Date Value Ref Range Status  04/13/2019 84.87 >60.00 mL/min Final   eGFR  Date Value Ref Range Status  08/14/2023 49 (L) >59 mL/min/1.73 Final         Passed - HBA1C is between 0 and 7.9 and within 180 days    Hemoglobin A1C  Date Value Ref Range Status  01/25/2024 6.3 (A) 4.0 - 5.6 % Final   Hgb A1c MFr Bld  Date Value Ref Range Status  11/05/2020 6.3 (H) 4.8 - 5.6 % Final    Comment:    (NOTE)         Prediabetes: 5.7 - 6.4         Diabetes: >6.4         Glycemic control for adults with diabetes: <7.0          Passed - Valid encounter within last 6 months    Recent Outpatient Visits           2 months ago Type 2 diabetes mellitus with other specified complication, unspecified whether long term insulin  use [facility])   [place] [facility] Bernardo Fend, DO   5 months ago Type 2 diabetes mellitus with other specified complication, unspecified whether long term insulin  use [facility])   [facility] Bernardo Fend, [place]

## 2024-04-19 ENCOUNTER — Telehealth: Payer: Self-pay | Admitting: Cardiology

## 2024-04-19 ENCOUNTER — Telehealth (HOSPITAL_COMMUNITY): Payer: Self-pay

## 2024-04-19 ENCOUNTER — Other Ambulatory Visit (HOSPITAL_COMMUNITY): Payer: Self-pay

## 2024-04-19 ENCOUNTER — Other Ambulatory Visit: Payer: Self-pay

## 2024-04-19 ENCOUNTER — Encounter: Payer: Self-pay | Admitting: Internal Medicine

## 2024-04-19 DIAGNOSIS — I272 Pulmonary hypertension, unspecified: Secondary | ICD-10-CM

## 2024-04-19 MED ORDER — TADALAFIL (PAH) 20 MG PO TABS: 40.0000 mg | ORAL_TABLET | Freq: Every day | ORAL | 3 refills | Status: AC

## 2024-04-19 NOTE — Telephone Encounter (Signed)
 Advanced Heart Failure Patient Advocate Encounter  Prior authorization for Tadalafil  [facility]) has been submitted and approved. Test billing returns $5.10 for 30 day supply. This plan limits 30 day fill.  KeyBETHA MORLEY Effective: 04/19/2024 to 04/13/2025  Rachel DEL, CPhT Rx Patient Advocate Phone: [phone]

## 2024-04-19 NOTE — Telephone Encounter (Signed)
 Refill sent to pharmacy.

## 2024-04-20 ENCOUNTER — Other Ambulatory Visit: Payer: Self-pay | Admitting: Pharmacy Technician

## 2024-04-20 ENCOUNTER — Other Ambulatory Visit: Payer: Self-pay

## 2024-04-20 NOTE — Progress Notes (Signed)
 Specialty Pharmacy Refill Coordination Note  Susan Barajas is a 79 y.o. female contacted today regarding refills of specialty medication(s) Imatinib  Mesylate (GLEEVEC )   Patient requested Delivery   Delivery date: 04/26/24   Verified address: [address]  [place] [facility] [id number]   Medication will be filled on: 04/25/24

## 2024-04-22 ENCOUNTER — Other Ambulatory Visit: Payer: Self-pay

## 2024-04-25 ENCOUNTER — Other Ambulatory Visit: Payer: Self-pay

## 2024-04-25 ENCOUNTER — Other Ambulatory Visit (HOSPITAL_COMMUNITY): Payer: Self-pay

## 2024-05-18 ENCOUNTER — Other Ambulatory Visit: Payer: Self-pay

## 2024-05-19 ENCOUNTER — Encounter: Payer: Self-pay | Admitting: Internal Medicine

## 2024-05-19 ENCOUNTER — Ambulatory Visit: Admitting: Internal Medicine

## 2024-05-19 VITALS — BP 120/60 | HR 66 | Temp 98.9°F | Ht 64.0 in | Wt 209.0 lb

## 2024-05-19 DIAGNOSIS — G4733 Obstructive sleep apnea (adult) (pediatric): Secondary | ICD-10-CM

## 2024-05-19 DIAGNOSIS — I5032 Chronic diastolic (congestive) heart failure: Secondary | ICD-10-CM

## 2024-05-19 DIAGNOSIS — J449 Chronic obstructive pulmonary disease, unspecified: Secondary | ICD-10-CM

## 2024-05-19 DIAGNOSIS — J9611 Chronic respiratory failure with hypoxia: Secondary | ICD-10-CM

## 2024-05-19 MED ORDER — ALBUTEROL SULFATE HFA 108 (90 BASE) MCG/ACT IN AERS: 2.0000 | INHALATION_SPRAY | Freq: Four times a day (QID) | RESPIRATORY_TRACT | 0 refills | Status: AC | PRN

## 2024-05-19 MED ORDER — STIOLTO RESPIMAT 2.5-2.5 MCG/ACT IN AERS: INHALATION_SPRAY | RESPIRATORY_TRACT | 11 refills | Status: AC

## 2024-05-19 NOTE — Progress Notes (Signed)
 "  @Patient  ID: Susan  Therisa Barajas, female    DOB: 1946-04-13, 79 y.o.   MRN: [id number]     SYNOPSIS 79 year old female former smoker followed for severe obstructive sleep apnea on nocturnal BiPAP and chronic respiratory failure on oxygen, COPD .  She has underlying pulmonary hypertension secondary to underlying sleep apnea, diastolic heart failure and cor pulmonale.  She is followed by cardiology and is on Opsumit  and tadalafil  COVID-19 infection with COVID-pneumonia in September 2020 Previous DVT in 2018 was on anticoagulation but had to be stopped due to refractory anemia with IVC filter placed  TEST/EVENTS :  RHC/LHC was done in 3/21, showing mild CAD; mean RA 12, PA 68/25 mean 42, mean PCWP 9, CI 4.1, PVR 4.13 WU.  V/Q scan in 3/21 did not show evidence for chronic PE.   High resolution chest CT in 3/21 did not show evidence for ILD, it did show mild-moderate emphysema.     pulmonary function testing in March 2021 showed moderate airflow obstruction with an FEV1 at 66%, ratio 56, FVC 91%, positive bronchodilator response.  Significant mid flow obstruction and reversibility.  DLCO 32%.  PET scan 09/2020 -Slight decrease in size of a left upper quadrant mass which demonstrates similar mild hypermetabolism. 2. A hypermetabolic ileocolic mesenteric node is slightly increased in size since the prior exam, suspicious metastatic disease. This could alternatively be reactive, given the appearance of the colon. 3. Chronic sigmoid wall thickening and diffuse colonic hypermetabolism in the setting of marked diverticulosis. Likely muscular hypertrophy and chronic diverticulitis. Correlate with any symptoms to suggest acute superimposed inflammation. Also correlate with colon cancer screening history to exclude underlying neoplasm.   CC  Follow-up assessment for COPD Follow-up assessment for OSA Follow-up assessment for chronic hypoxic respiratory failure Assessment of pulmonary  hypertension  HPI Regarding COPD Respiratory insufficiency seems to be stable No exacerbation at this time Placed on [facility] August 2021 Continues to do well with this inhaler Patient with poor respiratory insufficiency Continues to use oxygen as prescribed  Regarding oxygen therapy Regarding chronic hypoxic respiratory failure No significant changes over the last 1 year Short winded with prolonged walking Oxygen 4 L at home Is a portable oxygen concentrator Denies any recent increased oxygen demands  Regarding sleep apnea Discussed sleep data and reviewed with patient.  Encouraged proper weight management.  Discussed sleep hygiene Patient uses and benefits from therapy Using CPAP nightly and with naps Settings are comfortable and is sleeping well.   Patient says that this has really helped her feels that the inhaler has help with her breathing.   No increased albuterol  use.    Regarding pulmonary hypertension Most likely related to underlying COPD and OSA She is followed by cardiology for primary hypertension Follow-up echo shows improvement of pulmonary artery pressures at 43 back in May 2022 Repeat echocardiogram 2024 shows moderate elevation of pulmonary artery pressure Since her shortness of breath has improved with these medications denies any orthopnea or swelling  Oncology history  Patient has been diagnosed recently with a gastrointestinal stromal tumor.  She has been followed by oncology.  Is currently on Gleevec .        No Known Allergies  Immunization History  Administered Date(s) Administered   Fluad Quad(high Dose 65+) 03/14/2019   INFLUENZA, HIGH DOSE SEASONAL PF 01/25/2024   Influenza Split 01/25/2018   Influenza-Unspecified 01/09/2020, 02/24/2022   Moderna Sars-Covid-2 Vaccination 05/27/2019, 06/27/2019   PNEUMOCOCCAL CONJUGATE-20 01/25/2024   Pneumococcal Polysaccharide-23 04/13/2019    Past Medical History:  Diagnosis Date   Anemia     Asthma 1950   CHF (congestive heart failure) (HCC)    COPD (chronic obstructive pulmonary disease) (HCC)    Cor pulmonale (HCC)    DVT (deep venous thrombosis) (HCC) 2018   GERD (gastroesophageal reflux disease)    GIST (gastrointestinal stromal tumor) of small bowel, malignant (HCC)    Glaucoma 1949   Hyperlipidemia    Hypertension    Obesity, unspecified    OSA treated with BiPAP    Personal history of tobacco use, presenting hazards to health    Pneumonia    Presence of IVC filter    Pulmonary HTN (HCC)    Supplemental oxygen dependent    T2DM (type 2 diabetes mellitus) (HCC)    Varicose veins of lower extremities with other complications     Tobacco History: Social History   Tobacco Use  Smoking Status Former   Current packs/day: 0.00   Average packs/day: 1.5 packs/day for 20.0 years (30.0 ttl pk-yrs)   Types: Cigarettes   Start date: 33   Quit date: 1999   Years since quitting: 27.1  Smokeless Tobacco Never   Counseling given: Not Answered    Outpatient Medications Prior to Visit  Medication Sig Dispense Refill   acetaminophen  (TYLENOL ) 325 MG tablet Take 2 tablets (650 mg total) by mouth every 6 (six) hours as needed for mild pain (or Fever >/= 101).     albuterol  (VENTOLIN  HFA) 108 (90 Base) MCG/ACT inhaler Inhale 2 puffs into the lungs every 6 (six) hours as needed for wheezing or shortness of breath. 8 g 0   aspirin  81 MG chewable tablet Chew 1 tablet (81 mg total) by mouth daily. 30 tablet 0   clobetasol  cream (TEMOVATE ) 0.05 % Apply 1 Application topically 3 (three) times a week.     Continuous Glucose Sensor (FREESTYLE LIBRE 3 PLUS SENSOR) MISC Change sensor every 15 days. 2 each 6   dorzolamide-timolol  (COSOPT) 2-0.5 % ophthalmic solution 1 drop 2 (two) times daily.     EMBECTA PEN NEEDLE NANO 2 GEN 32G X 4 MM MISC USE WITH LANTUS  AND HUMALOG     FARXIGA  10 MG TABS tablet Take 1 tablet (10 mg total) by mouth daily. 90 tablet 1   ferrous gluconate   (FERGON) 325 MG tablet Take 325 mg by mouth daily with breakfast.     furosemide  (LASIX ) 20 MG tablet Take 20 mg by mouth 3 (three) times daily.     HUMALOG KWIKPEN 100 UNIT/ML KwikPen Inject into the skin.     imatinib  (GLEEVEC ) 100 MG tablet TAKE 4 TABLETS (400 MG TOTAL) BY MOUTH DAILY. TAKE WITH MEALS AND LARGE GLASS OF WATER 120 tablet 2   insulin  glargine (LANTUS ) 100 UNIT/ML injection Inject 32 Units into the skin in the morning.     latanoprost  (XALATAN ) 0.005 % ophthalmic solution Place 1 drop into both eyes at bedtime.      loperamide (IMODIUM) 2 MG capsule Take by mouth as needed for diarrhea or loose stools.     lovastatin (MEVACOR) 20 MG tablet Take 20 mg by mouth every evening.      metFORMIN  (GLUCOPHAGE ) 1000 MG tablet Take 1,000 mg by mouth daily.      Multiple Vitamin (MULTIVITAMIN) tablet Take 1 tablet by mouth daily.     omeprazole  (PRILOSEC) 40 MG capsule Take 40 mg by mouth daily.     OPSUMIT  10 MG tablet Take 1 tablet (10 mg total) by mouth daily. 90 tablet  3   OXYGEN Inhale into the lungs. [employer]     potassium chloride  SA (KLOR-CON  M) 20 MEQ tablet Take 2 tablets (40 mEq total) by mouth daily. 180 tablet 3   tadalafil , PAH, (ADCIRCA ) 20 MG tablet Take 2 tablets (40 mg total) by mouth daily. 60 tablet 3   timolol  (TIMOPTIC ) 0.5 % ophthalmic solution Place 1 drop into both eyes 2 (two) times daily.      Tiotropium Bromide-Olodaterol (STIOLTO RESPIMAT ) 2.5-2.5 MCG/ACT AERS INHALE 2 PUFFS BY MOUTH ONCE DAILY 4 g 11   No facility-administered medications prior to visit.    BP 120/60   Pulse 66   Temp 98.9 F (37.2 C)   Ht 5' 4 (1.626 m)   Wt 209 lb (94.8 kg)   SpO2 92%   BMI 35.87 kg/m     Physical Examination:  General Appearance: No distress  EYES EOM intact.   NECK Supple, No Bruits Pulmonary normal BS,No wheezing, No Rhonchi.  Cardiac S1,S2.  No Murmurs Ext No edema Neuro No Focal Deficits ALL OTHER ROS ARE NEGATIVE    Lab Results:  CBC     Component Value Date/Time   WBC 5.5 04/04/2024 0941   WBC 5.4 06/03/2022 1311   RBC 3.51 (L) 04/04/2024 0941   HGB 10.9 (L) 04/04/2024 0941   HCT 33.1 (L) 04/04/2024 0941   HCT 27.1 (L) 11/25/2016 2153   PLT 221 04/04/2024 0941   MCV 94.3 04/04/2024 0941   MCH 31.1 04/04/2024 0941   MCHC 32.9 04/04/2024 0941   RDW 13.7 04/04/2024 0941   LYMPHSABS 0.9 04/04/2024 0941   MONOABS 0.4 04/04/2024 0941   EOSABS 0.0 04/04/2024 0941   BASOSABS 0.0 04/04/2024 0941    BMET    Component Value Date/Time   NA 137 04/04/2024 0941   NA 137 08/14/2023 1402   K 4.1 04/04/2024 0941   CL 103 04/04/2024 0941   CO2 25 04/04/2024 0941   GLUCOSE 124 (H) 04/04/2024 0941   BUN 22 04/04/2024 0941   BUN 17 08/14/2023 1402   CREATININE 1.22 (H) 04/04/2024 0941   CALCIUM  9.4 04/04/2024 0941   GFRNONAA 45 (L) 04/04/2024 0941   GFRAA >60 10/28/2019 1213    BNP    Component Value Date/Time   BNP 48.0 11/18/2023 1608    ProBNP    Component Value Date/Time   PROBNP 819.0 (H) 04/13/2019 1712         Latest Ref Rng & Units 07/13/2019    1:18 PM  PFT Results  FVC-Pre L 2.04   FVC-Predicted Pre % 82   FVC-Post L 2.26   FVC-Predicted Post % 91   Pre FEV1/FVC % % 56   Post FEV1/FCV % % 56   FEV1-Pre L 1.14   FEV1-Predicted Pre % 59   FEV1-Post L 1.28   DLCO uncorrected ml/min/mmHg 6.67   DLCO UNC% % 32   DLVA Predicted % 49   TLC L 5.01   TLC % Predicted % 93   RV % Predicted % 127   PFTs reviewed in detail with patient from 2021 FEV1 FVC ratio is 56% predicted FEV1 is 59% predicted DLCO is significantly reduced No significant bronchodilator response      Assessment & Plan:   79 year old pleasant African-American female seen today for follow-up assessment for multiple medical issues including end-stage COPD with chronic hypoxic respiratory failure with diastolic heart failure with pulmonary hypertension likely related to OSA and COPD and emphysema associated with chronic  hypoxic respiratory failure on oxygen therapy   COPD assessment Chronic obstructive pulmonary disease, unspecified COPD type (HCC) Moderate COPD FEV1 59% predicted Continue inhalers as prescribed-Stiolto inhaler No exacerbation at this time Patient doing relatively well No indication for prednisone or antibiotics  Assessment of OSA Continue CPAP as prescribed  Excellent compliance report Reviewed compliance report in detail with patient Patient definitely benefits the use of CPAP therapy as prescribed Using CPAP nightly and with naps Pressure setting is comfortable and is sleeping well. AHI reduced to 0.8 Nocturnal BiPAP BiPAP 18/13 AHI reduced 0.5 Patient is a benefits from therapy  No evidence of acute heart failure at this time No respiratory distress No fevers, chills, nausea, vomiting, diarrhea No evidence hemoptysis  Patient Instructions minimum of 4-6 hours a night.  Change equipment as directed. Wash your tubing with warm soap and water daily, hang to dry. Wash humidifier portion weekly. Use bottled, distilled water and change daily Be aware of reduced alertness and do not drive or operate heavy machinery if experiencing this or drowsiness.  Exercise encouraged, as tolerated. Healthy weight management discussed.  Avoid or decrease alcohol consumption and medications that make you more sleepy, if possible. Notify if persistent daytime sleepiness occurs even with consistent use of PAP therapy.   Change supplies... Every month Mask cushions and/or nasal pillows BiPAP machine filters Every 3 months Mask frame (not including the headgear) BiPAP tubing Every 6 months Mask headgear Chin strap (if applicable) Humidifier water tub    Respiratory insufficiency With chronic hypoxic respiratory failure Chronic Hypoxic resp failure due to COPD -Patient benefits from oxygen therapy 2L [place]  -recommend using oxygen as prescribed -patient needs this for survival O2 sat  goals 88 to 92% Patient use and benefits from therapy Needs this for survival   Pulmonary HTN (HCC) Treat underling COPD and OSA Follow-up with cardiology Continue current regimen  Continue oxygen Chronic diastolic heart failure (HCC) Follow up cardiology   MEDICATION ADJUSTMENTS/LABS AND TESTS ORDERED: Continue BiPAP Continue oxygen Continue inhalers   CURRENT MEDICATIONS REVIEWED AT LENGTH WITH PATIENT TODAY   Patient  satisfied with Plan of action and management. All questions answered   Follow up 6 months   I spent a total of 40 minutes dedicated to the care of this patient on the date of this encounter to include pre-visit review of records, face-to-face time with the patient discussing conditions above, post visit ordering of testing, clinical documentation with the electronic health record, making appropriate referrals as documented, and communicating necessary information to the patient's healthcare team.    The Patient requires high complexity decision making for assessment and support, frequent evaluation and titration of therapies, application of advanced monitoring technologies and extensive interpretation of multiple databases.  Patient satisfied with Plan of action and management. All questions answered    Nickolas Alm Cellar, M.D.  [facility]  Medical Director [facility] [place]    "

## 2024-05-19 NOTE — Patient Instructions (Addendum)
 Need to be using your CPAP every night, minimum of 4-6 hours a night.  Change equipment as directed. Wash your tubing with warm soap and water daily, hang to dry. Wash humidifier portion weekly. Use bottled, distilled water and change daily Be aware of reduced alertness and do not drive or operate heavy machinery if experiencing this or drowsiness.  Exercise encouraged, as tolerated. Healthy weight management discussed.  Avoid or decrease alcohol consumption and medications that make you more sleepy, if possible. Notify if persistent daytime sleepiness occurs even with consistent use of PAP therapy.   Change CPAP supplies... Every month Mask cushions and/or nasal pillows CPAP machine filters Every 3 months Mask frame (not including the headgear) CPAP tubing Every 6 months Mask headgear Chin strap (if applicable) Humidifier water tub     Excellent Job A+ GOLD STAR!!  Continue CPAP as prescribed  Patient Instructions Continue to use CPAP every night, minimum of 4-6 hours a night.  Change equipment every 30 days or as directed by DME.  Wash your tubing with warm soap and water daily, hang to dry. Wash humidifier portion weekly. Use bottled, distilled water and change daily   Be aware of reduced alertness and do not drive or operate heavy machinery if experiencing this or drowsiness.  Exercise encouraged, as tolerated. Encouraged proper weight management.  Important to get eight or more hours of sleep  Limiting the use of the computer and television before bedtime.  Decrease naps during the day, so night time sleep will become enhanced.  Limit caffeine, and sleep deprivation.    Avoid Allergens and Irritants Avoid secondhand smoke Avoid SICK contacts Recommend  Masking  when appropriate Recommend Keep up-to-date with vaccinations  Continue to use sailers as prescribed

## 2024-05-20 ENCOUNTER — Other Ambulatory Visit (HOSPITAL_COMMUNITY): Payer: Self-pay

## 2024-05-20 NOTE — Progress Notes (Signed)
 Specialty Pharmacy Refill Coordination Note  Susan  Kitti Barajas is a 79 y.o. female contacted today regarding refills of specialty medication(s) Imatinib  Mesylate (GLEEVEC )   Patient requested Delivery   Delivery date: 05/25/24   Verified address: [address] [place] [facility] [address]   Medication will be filled on: 05/24/24

## 2024-06-06 ENCOUNTER — Ambulatory Visit: Admitting: Cardiology

## 2024-07-04 ENCOUNTER — Inpatient Hospital Stay: Admitting: Internal Medicine

## 2024-07-04 ENCOUNTER — Inpatient Hospital Stay

## 2024-07-25 ENCOUNTER — Ambulatory Visit: Admitting: Internal Medicine
